# Patient Record
Sex: Female | Born: 1980 | Race: Black or African American | Hispanic: No | Marital: Single | Smoking: Former smoker
Health system: Southern US, Community
[De-identification: ages and names within clinical notes are randomized; demographics above are authoritative.]

## PROBLEM LIST (undated history)

## (undated) DIAGNOSIS — E782 Mixed hyperlipidemia: Secondary | ICD-10-CM

## (undated) DIAGNOSIS — B009 Herpesviral infection, unspecified: Secondary | ICD-10-CM

## (undated) DIAGNOSIS — F1221 Cannabis dependence, in remission: Secondary | ICD-10-CM

## (undated) DIAGNOSIS — N209 Urinary calculus, unspecified: Secondary | ICD-10-CM

## (undated) DIAGNOSIS — G43909 Migraine, unspecified, not intractable, without status migrainosus: Secondary | ICD-10-CM

## (undated) DIAGNOSIS — B999 Unspecified infectious disease: Secondary | ICD-10-CM

## (undated) DIAGNOSIS — O009 Unspecified ectopic pregnancy without intrauterine pregnancy: Secondary | ICD-10-CM

## (undated) DIAGNOSIS — J45909 Unspecified asthma, uncomplicated: Secondary | ICD-10-CM

## (undated) DIAGNOSIS — F17200 Nicotine dependence, unspecified, uncomplicated: Secondary | ICD-10-CM

## (undated) DIAGNOSIS — A599 Trichomoniasis, unspecified: Secondary | ICD-10-CM

## (undated) DIAGNOSIS — R7309 Other abnormal glucose: Secondary | ICD-10-CM

## (undated) HISTORY — PX: SALPINGECTOMY: SHX328

## (undated) HISTORY — DX: Nicotine dependence, unspecified, uncomplicated: F17.200

## (undated) HISTORY — DX: Urinary calculus, unspecified: N20.9

## (undated) HISTORY — DX: Other abnormal glucose: R73.09

## (undated) HISTORY — DX: Migraine, unspecified, not intractable, without status migrainosus: G43.909

## (undated) HISTORY — DX: Mixed hyperlipidemia: E78.2

## (undated) HISTORY — PX: DILATION AND CURETTAGE OF UTERUS: SHX78

## (undated) HISTORY — DX: Unspecified asthma, uncomplicated: J45.909

## (undated) HISTORY — DX: Cannabis dependence, in remission: F12.21

---
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
## 2002-03-21 ENCOUNTER — Inpatient Hospital Stay (HOSPITAL_COMMUNITY): Admission: AD | Admit: 2002-03-21 | Discharge: 2002-03-21 | Payer: Self-pay | Admitting: Obstetrics and Gynecology

## 2002-04-18 ENCOUNTER — Emergency Department (HOSPITAL_COMMUNITY): Admission: EM | Admit: 2002-04-18 | Discharge: 2002-04-18 | Payer: Self-pay

## 2002-05-21 ENCOUNTER — Emergency Department (HOSPITAL_COMMUNITY): Admission: EM | Admit: 2002-05-21 | Discharge: 2002-05-21 | Payer: Self-pay | Admitting: Emergency Medicine

## 2002-06-19 ENCOUNTER — Emergency Department (HOSPITAL_COMMUNITY): Admission: EM | Admit: 2002-06-19 | Discharge: 2002-06-19 | Payer: Self-pay | Admitting: Emergency Medicine

## 2002-10-31 ENCOUNTER — Emergency Department (HOSPITAL_COMMUNITY): Admission: EM | Admit: 2002-10-31 | Discharge: 2002-10-31 | Payer: Self-pay | Admitting: Emergency Medicine

## 2002-11-14 ENCOUNTER — Inpatient Hospital Stay (HOSPITAL_COMMUNITY): Admission: AD | Admit: 2002-11-14 | Discharge: 2002-11-14 | Payer: Self-pay | Admitting: Obstetrics and Gynecology

## 2003-02-21 ENCOUNTER — Emergency Department (HOSPITAL_COMMUNITY): Admission: EM | Admit: 2003-02-21 | Discharge: 2003-02-21 | Payer: Self-pay | Admitting: Emergency Medicine

## 2003-04-05 ENCOUNTER — Other Ambulatory Visit: Admission: RE | Admit: 2003-04-05 | Discharge: 2003-04-05 | Payer: Self-pay | Admitting: Nephrology

## 2003-07-03 ENCOUNTER — Inpatient Hospital Stay (HOSPITAL_COMMUNITY): Admission: AD | Admit: 2003-07-03 | Discharge: 2003-07-04 | Payer: Self-pay | Admitting: Obstetrics and Gynecology

## 2003-12-05 ENCOUNTER — Inpatient Hospital Stay (HOSPITAL_COMMUNITY): Admission: AD | Admit: 2003-12-05 | Discharge: 2003-12-05 | Payer: Self-pay | Admitting: Obstetrics and Gynecology

## 2003-12-05 ENCOUNTER — Emergency Department (HOSPITAL_COMMUNITY): Admission: AD | Admit: 2003-12-05 | Discharge: 2003-12-05 | Payer: Self-pay | Admitting: Emergency Medicine

## 2004-02-20 ENCOUNTER — Emergency Department (HOSPITAL_COMMUNITY): Admission: EM | Admit: 2004-02-20 | Discharge: 2004-02-20 | Payer: Self-pay | Admitting: Emergency Medicine

## 2004-03-28 ENCOUNTER — Emergency Department (HOSPITAL_COMMUNITY): Admission: EM | Admit: 2004-03-28 | Discharge: 2004-03-28 | Payer: Self-pay | Admitting: *Deleted

## 2004-04-24 ENCOUNTER — Emergency Department (HOSPITAL_COMMUNITY): Admission: EM | Admit: 2004-04-24 | Discharge: 2004-04-24 | Payer: Self-pay | Admitting: Family Medicine

## 2004-06-07 ENCOUNTER — Emergency Department (HOSPITAL_COMMUNITY): Admission: EM | Admit: 2004-06-07 | Discharge: 2004-06-07 | Payer: Self-pay | Admitting: Emergency Medicine

## 2005-01-11 ENCOUNTER — Emergency Department (HOSPITAL_COMMUNITY): Admission: EM | Admit: 2005-01-11 | Discharge: 2005-01-11 | Payer: Self-pay | Admitting: Family Medicine

## 2005-05-31 ENCOUNTER — Emergency Department (HOSPITAL_COMMUNITY): Admission: EM | Admit: 2005-05-31 | Discharge: 2005-05-31 | Payer: Self-pay | Admitting: Family Medicine

## 2005-05-31 ENCOUNTER — Emergency Department (HOSPITAL_COMMUNITY): Admission: EM | Admit: 2005-05-31 | Discharge: 2005-05-31 | Payer: Self-pay | Admitting: Emergency Medicine

## 2005-10-18 ENCOUNTER — Emergency Department (HOSPITAL_COMMUNITY): Admission: EM | Admit: 2005-10-18 | Discharge: 2005-10-18 | Payer: Self-pay | Admitting: Family Medicine

## 2005-11-30 ENCOUNTER — Emergency Department (HOSPITAL_COMMUNITY): Admission: EM | Admit: 2005-11-30 | Discharge: 2005-11-30 | Payer: Self-pay | Admitting: Family Medicine

## 2005-12-04 ENCOUNTER — Emergency Department (HOSPITAL_COMMUNITY): Admission: EM | Admit: 2005-12-04 | Discharge: 2005-12-04 | Payer: Self-pay | Admitting: Family Medicine

## 2005-12-08 ENCOUNTER — Emergency Department (HOSPITAL_COMMUNITY): Admission: EM | Admit: 2005-12-08 | Discharge: 2005-12-08 | Payer: Self-pay | Admitting: Family Medicine

## 2006-02-20 ENCOUNTER — Emergency Department (HOSPITAL_COMMUNITY): Admission: EM | Admit: 2006-02-20 | Discharge: 2006-02-20 | Payer: Self-pay | Admitting: Emergency Medicine

## 2006-06-07 ENCOUNTER — Emergency Department (HOSPITAL_COMMUNITY): Admission: EM | Admit: 2006-06-07 | Discharge: 2006-06-07 | Payer: Self-pay | Admitting: Family Medicine

## 2006-06-11 ENCOUNTER — Inpatient Hospital Stay (HOSPITAL_COMMUNITY): Admission: AD | Admit: 2006-06-11 | Discharge: 2006-06-11 | Payer: Self-pay | Admitting: Family Medicine

## 2006-06-20 ENCOUNTER — Inpatient Hospital Stay (HOSPITAL_COMMUNITY): Admission: AD | Admit: 2006-06-20 | Discharge: 2006-06-20 | Payer: Self-pay | Admitting: Gynecology

## 2006-06-28 ENCOUNTER — Inpatient Hospital Stay (HOSPITAL_COMMUNITY): Admission: RE | Admit: 2006-06-28 | Discharge: 2006-06-28 | Payer: Self-pay | Admitting: Gynecology

## 2006-07-02 ENCOUNTER — Emergency Department (HOSPITAL_COMMUNITY): Admission: EM | Admit: 2006-07-02 | Discharge: 2006-07-02 | Payer: Self-pay | Admitting: Family Medicine

## 2006-07-17 ENCOUNTER — Inpatient Hospital Stay (HOSPITAL_COMMUNITY): Admission: AD | Admit: 2006-07-17 | Discharge: 2006-07-18 | Payer: Self-pay | Admitting: Obstetrics & Gynecology

## 2006-08-18 ENCOUNTER — Inpatient Hospital Stay (HOSPITAL_COMMUNITY): Admission: AD | Admit: 2006-08-18 | Discharge: 2006-08-18 | Payer: Self-pay | Admitting: Family Medicine

## 2006-10-28 ENCOUNTER — Inpatient Hospital Stay (HOSPITAL_COMMUNITY): Admission: AD | Admit: 2006-10-28 | Discharge: 2006-10-28 | Payer: Self-pay | Admitting: Obstetrics

## 2006-11-30 ENCOUNTER — Inpatient Hospital Stay (HOSPITAL_COMMUNITY): Admission: AD | Admit: 2006-11-30 | Discharge: 2006-11-30 | Payer: Self-pay | Admitting: Obstetrics

## 2006-12-09 ENCOUNTER — Inpatient Hospital Stay (HOSPITAL_COMMUNITY): Admission: AD | Admit: 2006-12-09 | Discharge: 2006-12-09 | Payer: Self-pay | Admitting: Obstetrics

## 2006-12-27 ENCOUNTER — Inpatient Hospital Stay (HOSPITAL_COMMUNITY): Admission: AD | Admit: 2006-12-27 | Discharge: 2006-12-27 | Payer: Self-pay | Admitting: Obstetrics

## 2007-01-11 ENCOUNTER — Inpatient Hospital Stay (HOSPITAL_COMMUNITY): Admission: AD | Admit: 2007-01-11 | Discharge: 2007-01-11 | Payer: Self-pay | Admitting: Obstetrics

## 2007-01-17 ENCOUNTER — Inpatient Hospital Stay (HOSPITAL_COMMUNITY): Admission: AD | Admit: 2007-01-17 | Discharge: 2007-01-17 | Payer: Self-pay | Admitting: Obstetrics

## 2007-01-19 ENCOUNTER — Inpatient Hospital Stay (HOSPITAL_COMMUNITY): Admission: AD | Admit: 2007-01-19 | Discharge: 2007-01-19 | Payer: Self-pay | Admitting: Obstetrics

## 2007-01-20 ENCOUNTER — Inpatient Hospital Stay (HOSPITAL_COMMUNITY): Admission: AD | Admit: 2007-01-20 | Discharge: 2007-01-20 | Payer: Self-pay | Admitting: Obstetrics

## 2007-01-22 ENCOUNTER — Inpatient Hospital Stay (HOSPITAL_COMMUNITY): Admission: AD | Admit: 2007-01-22 | Discharge: 2007-01-22 | Payer: Self-pay | Admitting: Obstetrics

## 2007-01-23 ENCOUNTER — Inpatient Hospital Stay (HOSPITAL_COMMUNITY): Admission: AD | Admit: 2007-01-23 | Discharge: 2007-01-23 | Payer: Self-pay | Admitting: Obstetrics

## 2007-01-29 ENCOUNTER — Inpatient Hospital Stay (HOSPITAL_COMMUNITY): Admission: AD | Admit: 2007-01-29 | Discharge: 2007-01-29 | Payer: Self-pay | Admitting: Obstetrics

## 2007-02-02 ENCOUNTER — Inpatient Hospital Stay (HOSPITAL_COMMUNITY): Admission: AD | Admit: 2007-02-02 | Discharge: 2007-02-03 | Payer: Self-pay | Admitting: Obstetrics

## 2007-02-04 ENCOUNTER — Inpatient Hospital Stay (HOSPITAL_COMMUNITY): Admission: AD | Admit: 2007-02-04 | Discharge: 2007-02-06 | Payer: Self-pay | Admitting: Obstetrics

## 2008-05-23 ENCOUNTER — Emergency Department (HOSPITAL_COMMUNITY): Admission: EM | Admit: 2008-05-23 | Discharge: 2008-05-23 | Payer: Self-pay | Admitting: Family Medicine

## 2008-05-27 ENCOUNTER — Inpatient Hospital Stay (HOSPITAL_COMMUNITY): Admission: AD | Admit: 2008-05-27 | Discharge: 2008-05-27 | Payer: Self-pay | Admitting: Obstetrics & Gynecology

## 2008-07-13 ENCOUNTER — Inpatient Hospital Stay (HOSPITAL_COMMUNITY): Admission: AD | Admit: 2008-07-13 | Discharge: 2008-07-14 | Payer: Self-pay | Admitting: Obstetrics

## 2008-07-24 ENCOUNTER — Inpatient Hospital Stay (HOSPITAL_COMMUNITY): Admission: AD | Admit: 2008-07-24 | Discharge: 2008-07-24 | Payer: Self-pay | Admitting: Obstetrics

## 2008-07-29 ENCOUNTER — Inpatient Hospital Stay (HOSPITAL_COMMUNITY): Admission: AD | Admit: 2008-07-29 | Discharge: 2008-07-29 | Payer: Self-pay | Admitting: Obstetrics

## 2008-11-25 ENCOUNTER — Emergency Department (HOSPITAL_COMMUNITY): Admission: EM | Admit: 2008-11-25 | Discharge: 2008-11-25 | Payer: Self-pay | Admitting: Emergency Medicine

## 2008-12-13 ENCOUNTER — Inpatient Hospital Stay (HOSPITAL_COMMUNITY): Admission: AD | Admit: 2008-12-13 | Discharge: 2008-12-13 | Payer: Self-pay | Admitting: Obstetrics

## 2008-12-25 ENCOUNTER — Inpatient Hospital Stay (HOSPITAL_COMMUNITY): Admission: AD | Admit: 2008-12-25 | Discharge: 2008-12-25 | Payer: Self-pay | Admitting: Obstetrics

## 2009-01-08 ENCOUNTER — Inpatient Hospital Stay (HOSPITAL_COMMUNITY): Admission: AD | Admit: 2009-01-08 | Discharge: 2009-01-08 | Payer: Self-pay | Admitting: Obstetrics

## 2009-01-13 ENCOUNTER — Inpatient Hospital Stay (HOSPITAL_COMMUNITY): Admission: RE | Admit: 2009-01-13 | Discharge: 2009-01-15 | Payer: Self-pay | Admitting: Obstetrics

## 2009-04-16 ENCOUNTER — Inpatient Hospital Stay (HOSPITAL_COMMUNITY): Admission: AD | Admit: 2009-04-16 | Discharge: 2009-04-17 | Payer: Self-pay | Admitting: Obstetrics

## 2009-06-16 ENCOUNTER — Inpatient Hospital Stay (HOSPITAL_COMMUNITY): Admission: AD | Admit: 2009-06-16 | Discharge: 2009-06-16 | Payer: Self-pay | Admitting: Obstetrics

## 2009-07-14 ENCOUNTER — Ambulatory Visit (HOSPITAL_COMMUNITY): Admission: RE | Admit: 2009-07-14 | Discharge: 2009-07-14 | Payer: Self-pay | Admitting: Obstetrics

## 2009-11-02 ENCOUNTER — Inpatient Hospital Stay (HOSPITAL_COMMUNITY): Admission: AD | Admit: 2009-11-02 | Discharge: 2009-11-02 | Payer: Self-pay | Admitting: Obstetrics

## 2009-11-02 ENCOUNTER — Ambulatory Visit: Payer: Self-pay | Admitting: Obstetrics and Gynecology

## 2009-11-21 ENCOUNTER — Observation Stay (HOSPITAL_COMMUNITY): Admission: AD | Admit: 2009-11-21 | Discharge: 2009-11-22 | Payer: Self-pay | Admitting: Obstetrics

## 2009-11-29 ENCOUNTER — Inpatient Hospital Stay (HOSPITAL_COMMUNITY): Admission: AD | Admit: 2009-11-29 | Discharge: 2009-12-01 | Payer: Self-pay | Admitting: Obstetrics

## 2010-05-01 ENCOUNTER — Inpatient Hospital Stay (HOSPITAL_COMMUNITY): Admission: AD | Admit: 2010-05-01 | Discharge: 2010-05-01 | Payer: Self-pay | Admitting: Obstetrics & Gynecology

## 2010-05-01 ENCOUNTER — Ambulatory Visit: Payer: Self-pay | Admitting: Nurse Practitioner

## 2010-06-22 ENCOUNTER — Inpatient Hospital Stay (HOSPITAL_COMMUNITY): Admission: AD | Admit: 2010-06-22 | Discharge: 2010-06-22 | Payer: Self-pay | Admitting: Family Medicine

## 2010-09-01 ENCOUNTER — Inpatient Hospital Stay (HOSPITAL_COMMUNITY)
Admission: AD | Admit: 2010-09-01 | Discharge: 2010-09-01 | Payer: Self-pay | Source: Home / Self Care | Admitting: Anesthesiology

## 2010-12-04 ENCOUNTER — Inpatient Hospital Stay (HOSPITAL_COMMUNITY)
Admission: AD | Admit: 2010-12-04 | Discharge: 2010-12-07 | DRG: 767 | Disposition: A | Discharge status: Home / Self Care | Payer: Medicaid Other | Source: Ambulatory Visit | Attending: Obstetrics | Admitting: Obstetrics

## 2010-12-04 DIAGNOSIS — Z302 Encounter for sterilization: Secondary | ICD-10-CM

## 2010-12-05 ENCOUNTER — Other Ambulatory Visit: Payer: Self-pay | Admitting: Obstetrics

## 2010-12-05 HISTORY — PX: TUBAL LIGATION: SHX77

## 2010-12-05 LAB — CBC
Hemoglobin: 9.6 g/dL — ABNORMAL LOW (ref 12.0–15.0)
MCH: 24.4 pg — ABNORMAL LOW (ref 26.0–34.0)
MCHC: 31.2 g/dL (ref 30.0–36.0)
RDW: 17 % — ABNORMAL HIGH (ref 11.5–15.5)

## 2010-12-05 LAB — RPR: RPR Ser Ql: NONREACTIVE

## 2010-12-06 LAB — CBC
HCT: 27 % — ABNORMAL LOW (ref 36.0–46.0)
MCHC: 30 g/dL (ref 30.0–36.0)
MCV: 79.2 fL (ref 78.0–100.0)
Platelets: 148 10*3/uL — ABNORMAL LOW (ref 150–400)
RDW: 17.1 % — ABNORMAL HIGH (ref 11.5–15.5)

## 2010-12-07 LAB — URINALYSIS, ROUTINE W REFLEX MICROSCOPIC
Bilirubin Urine: NEGATIVE
Glucose, UA: NEGATIVE mg/dL
Ketones, ur: NEGATIVE mg/dL
Nitrite: NEGATIVE
Protein, ur: NEGATIVE mg/dL
Specific Gravity, Urine: >1.03 — ABNORMAL HIGH (ref 1.005–1.030)
Urobilinogen, UA: >8 mg/dL — ABNORMAL HIGH (ref 0.0–1.0)
pH: 6 (ref 5.0–8.0)

## 2010-12-07 LAB — WET PREP, GENITAL
Trich, Wet Prep: NONE SEEN
Yeast Wet Prep HPF POC: NONE SEEN

## 2010-12-07 LAB — URINE CULTURE
Colony Count: 35000
Culture  Setup Time: 201112070145

## 2010-12-07 LAB — URINE MICROSCOPIC-ADD ON

## 2010-12-07 LAB — GC/CHLAMYDIA PROBE AMP, GENITAL
Chlamydia, DNA Probe: NEGATIVE
GC Probe Amp, Genital: NEGATIVE

## 2010-12-09 NOTE — Op Note (Signed)
  Theresa Mooney, Theresa Mooney NO.:  [id number]  MEDICAL RECORD NO.:  [id number]           PATIENT TYPE:  I  LOCATION:  9110                          FACILITY:  [facility]  PHYSICIAN:  Kathreen Cosier, M.D.DATE OF BIRTH:  09-23-1981  DATE OF PROCEDURE:  12/05/2010 DATE OF DISCHARGE:                              OPERATIVE REPORT   PREOPERATIVE DIAGNOSES:  Multiparity, desires postpartum tubal ligation.  ANESTHESIA:  Epidural.  PROCEDURE:  The patient placed on the operating table in supine position.  Abdomen prepped and draped.  Bladder emptied with a Foley catheter.  Midline subumbilical incision 1-inch long was made, carried down to the fascia.  Fascia cleaned, fascia grasped with 2 Kochers, and the fascia and the peritoneum opened with Mayo scissors.  Left tube grasped in midportion with Babcock clamp.  A 0 plain suture placed in the mesosalpinx below the portion of tube within the clamp.  This was tied and approximately 1 inch of tube removed.  Procedure done in a similar fashion the other side.  Lap and sponge counts correct.  Abdomen closed in layers, peritoneum and fascia continuous suture with 0 Dexon. Skin closed with subcuticular stitch of 4-0 Monocryl.  Blood loss minimal.  The patient tolerated the procedure well, and taken to recovery room in good condition.          ______________________________ Kathreen Cosier, M.D.     BAM/MEDQ  D:  12/05/2010  T:  12/05/2010  Job:  [id number]  Electronically Signed by Francoise Ceo M.D. on 12/09/2010 07:14:09 AM

## 2010-12-10 LAB — URINALYSIS, ROUTINE W REFLEX MICROSCOPIC
Bilirubin Urine: NEGATIVE
Glucose, UA: NEGATIVE mg/dL
Ketones, ur: NEGATIVE mg/dL
pH: 6 (ref 5.0–8.0)

## 2010-12-10 LAB — URINE MICROSCOPIC-ADD ON

## 2010-12-11 LAB — URINALYSIS, ROUTINE W REFLEX MICROSCOPIC
Nitrite: NEGATIVE
Specific Gravity, Urine: 1.025 (ref 1.005–1.030)
Urobilinogen, UA: 1 mg/dL (ref 0.0–1.0)

## 2010-12-11 LAB — URINE CULTURE
Colony Count: >=100000
Culture  Setup Time: 201108060248

## 2010-12-11 LAB — CBC
HCT: 31.3 % — ABNORMAL LOW (ref 36.0–46.0)
Hemoglobin: 10.4 g/dL — ABNORMAL LOW (ref 12.0–15.0)
MCH: 28.9 pg (ref 26.0–34.0)
MCHC: 33.3 g/dL (ref 30.0–36.0)
RBC: 3.61 MIL/uL — ABNORMAL LOW (ref 3.87–5.11)

## 2010-12-11 LAB — GC/CHLAMYDIA PROBE AMP, GENITAL
Chlamydia, DNA Probe: NEGATIVE
GC Probe Amp, Genital: NEGATIVE

## 2010-12-11 LAB — URINE MICROSCOPIC-ADD ON

## 2010-12-11 LAB — WET PREP, GENITAL

## 2010-12-16 LAB — URINE MICROSCOPIC-ADD ON

## 2010-12-16 LAB — DIFFERENTIAL
Eosinophils Absolute: 0 10*3/uL (ref 0.0–0.7)
Lymphocytes Relative: 17 % (ref 12–46)
Lymphs Abs: 0.8 10*3/uL (ref 0.7–4.0)
Monocytes Relative: 12 % (ref 3–12)
Neutro Abs: 3.2 10*3/uL (ref 1.7–7.7)
Neutrophils Relative %: 71 % (ref 43–77)

## 2010-12-16 LAB — CBC
Hemoglobin: 10.1 g/dL — ABNORMAL LOW (ref 12.0–15.0)
MCHC: 31.8 g/dL (ref 30.0–36.0)
MCV: 82 fL (ref 78.0–100.0)
Platelets: 144 10*3/uL — ABNORMAL LOW (ref 150–400)
RBC: 3.37 MIL/uL — ABNORMAL LOW (ref 3.87–5.11)
RBC: 3.89 MIL/uL (ref 3.87–5.11)
WBC: 4.5 10*3/uL (ref 4.0–10.5)
WBC: 6.7 10*3/uL (ref 4.0–10.5)

## 2010-12-16 LAB — RPR: RPR Ser Ql: NONREACTIVE

## 2010-12-16 LAB — BASIC METABOLIC PANEL
BUN: 6 mg/dL (ref 6–23)
Calcium: 8.4 mg/dL (ref 8.4–10.5)
Chloride: 103 mEq/L (ref 96–112)
Creatinine, Ser: 0.62 mg/dL (ref 0.4–1.2)
GFR calc Af Amer: >60 mL/min (ref >60)
GFR calc non Af Amer: >60 mL/min (ref >60)

## 2010-12-16 LAB — URINALYSIS, ROUTINE W REFLEX MICROSCOPIC
Glucose, UA: NEGATIVE mg/dL
Ketones, ur: 15 mg/dL — AB
Leukocytes, UA: NEGATIVE
Nitrite: NEGATIVE
Specific Gravity, Urine: >1.03 — ABNORMAL HIGH (ref 1.005–1.030)
pH: 6 (ref 5.0–8.0)

## 2010-12-20 LAB — CBC
HCT: 29 % — ABNORMAL LOW (ref 36.0–46.0)
Hemoglobin: 8.7 g/dL — ABNORMAL LOW (ref 12.0–15.0)
Hemoglobin: 9.1 g/dL — ABNORMAL LOW (ref 12.0–15.0)
MCHC: 31.6 g/dL (ref 30.0–36.0)
MCV: 81.9 fL (ref 78.0–100.0)
MCV: 82.6 fL (ref 78.0–100.0)
Platelets: 178 10*3/uL (ref 150–400)
RBC: 3.33 MIL/uL — ABNORMAL LOW (ref 3.87–5.11)
RDW: 16.1 % — ABNORMAL HIGH (ref 11.5–15.5)
RDW: 16.6 % — ABNORMAL HIGH (ref 11.5–15.5)

## 2010-12-27 DIAGNOSIS — A599 Trichomoniasis, unspecified: Secondary | ICD-10-CM

## 2010-12-27 HISTORY — DX: Trichomoniasis, unspecified: A59.9

## 2011-01-01 LAB — URINALYSIS, ROUTINE W REFLEX MICROSCOPIC
Bilirubin Urine: NEGATIVE
Nitrite: NEGATIVE
Protein, ur: NEGATIVE mg/dL
Urobilinogen, UA: 1 mg/dL (ref 0.0–1.0)

## 2011-01-01 LAB — CBC
HCT: 31 % — ABNORMAL LOW (ref 36.0–46.0)
Hemoglobin: 10.1 g/dL — ABNORMAL LOW (ref 12.0–15.0)
MCHC: 32.7 g/dL (ref 30.0–36.0)
Platelets: 220 10*3/uL (ref 150–400)
RDW: 13.2 % (ref 11.5–15.5)

## 2011-01-01 LAB — URINE MICROSCOPIC-ADD ON

## 2011-01-01 LAB — GC/CHLAMYDIA PROBE AMP, GENITAL: GC Probe Amp, Genital: NEGATIVE

## 2011-01-03 LAB — COMPREHENSIVE METABOLIC PANEL
Albumin: 3.6 g/dL (ref 3.5–5.2)
Alkaline Phosphatase: 79 U/L (ref 39–117)
BUN: 11 mg/dL (ref 6–23)
Creatinine, Ser: 0.77 mg/dL (ref 0.4–1.2)
Potassium: 3.8 mEq/L (ref 3.5–5.1)
Total Protein: 7.1 g/dL (ref 6.0–8.3)

## 2011-01-03 LAB — WET PREP, GENITAL
Trich, Wet Prep: NONE SEEN
Yeast Wet Prep HPF POC: NONE SEEN

## 2011-01-03 LAB — URINALYSIS, ROUTINE W REFLEX MICROSCOPIC
Bilirubin Urine: NEGATIVE
Specific Gravity, Urine: 1.02 (ref 1.005–1.030)
Urobilinogen, UA: 1 mg/dL (ref 0.0–1.0)

## 2011-01-03 LAB — GC/CHLAMYDIA PROBE AMP, GENITAL
Chlamydia, DNA Probe: NEGATIVE
GC Probe Amp, Genital: NEGATIVE

## 2011-01-03 LAB — URINE MICROSCOPIC-ADD ON

## 2011-01-03 LAB — POCT PREGNANCY, URINE: Preg Test, Ur: POSITIVE

## 2011-01-06 LAB — CBC
HCT: 28.3 % — ABNORMAL LOW (ref 36.0–46.0)
HCT: 31.2 % — ABNORMAL LOW (ref 36.0–46.0)
Hemoglobin: 10.4 g/dL — ABNORMAL LOW (ref 12.0–15.0)
MCHC: 32.8 g/dL (ref 30.0–36.0)
MCHC: 33.3 g/dL (ref 30.0–36.0)
MCV: 84.4 fL (ref 78.0–100.0)
MCV: 84.7 fL (ref 78.0–100.0)
Platelets: 166 10*3/uL (ref 150–400)
RBC: 3.69 MIL/uL — ABNORMAL LOW (ref 3.87–5.11)
RDW: 15.6 % — ABNORMAL HIGH (ref 11.5–15.5)

## 2011-06-28 LAB — URINALYSIS, ROUTINE W REFLEX MICROSCOPIC
Bilirubin Urine: NEGATIVE
Glucose, UA: NEGATIVE
Ketones, ur: 40 — AB
Leukocytes, UA: NEGATIVE
Nitrite: NEGATIVE
Nitrite: NEGATIVE
Protein, ur: NEGATIVE
Urobilinogen, UA: 0.2
pH: 6

## 2011-06-28 LAB — URINE MICROSCOPIC-ADD ON

## 2011-06-28 LAB — WET PREP, GENITAL

## 2011-06-29 LAB — URINE MICROSCOPIC-ADD ON

## 2011-06-29 LAB — URINALYSIS, ROUTINE W REFLEX MICROSCOPIC
Leukocytes, UA: NEGATIVE
Nitrite: NEGATIVE
Specific Gravity, Urine: >1.03 — ABNORMAL HIGH
Urobilinogen, UA: 0.2
pH: 5.5

## 2011-11-26 ENCOUNTER — Inpatient Hospital Stay (HOSPITAL_COMMUNITY): Payer: Self-pay

## 2011-11-26 ENCOUNTER — Inpatient Hospital Stay (HOSPITAL_COMMUNITY)
Admission: AD | Admit: 2011-11-26 | Discharge: 2011-11-26 | Disposition: A | Discharge status: Home / Self Care | Payer: Self-pay | Source: Ambulatory Visit | Attending: Family Medicine | Admitting: Family Medicine

## 2011-11-26 ENCOUNTER — Encounter (HOSPITAL_COMMUNITY): Payer: Self-pay | Admitting: *Deleted

## 2011-11-26 DIAGNOSIS — R109 Unspecified abdominal pain: Secondary | ICD-10-CM | POA: Insufficient documentation

## 2011-11-26 DIAGNOSIS — O00109 Unspecified tubal pregnancy without intrauterine pregnancy: Secondary | ICD-10-CM | POA: Insufficient documentation

## 2011-11-26 DIAGNOSIS — R300 Dysuria: Secondary | ICD-10-CM | POA: Insufficient documentation

## 2011-11-26 DIAGNOSIS — O009 Unspecified ectopic pregnancy without intrauterine pregnancy: Secondary | ICD-10-CM

## 2011-11-26 HISTORY — DX: Trichomoniasis, unspecified: A59.9

## 2011-11-26 HISTORY — DX: Unspecified infectious disease: B99.9

## 2011-11-26 HISTORY — DX: Herpesviral infection, unspecified: B00.9

## 2011-11-26 LAB — URINALYSIS, ROUTINE W REFLEX MICROSCOPIC
Bilirubin Urine: NEGATIVE
Glucose, UA: NEGATIVE mg/dL
Ketones, ur: NEGATIVE mg/dL
Protein, ur: NEGATIVE mg/dL
pH: 6 (ref 5.0–8.0)

## 2011-11-26 LAB — CBC
HCT: 33.7 % — ABNORMAL LOW (ref 36.0–46.0)
Hemoglobin: 10.9 g/dL — ABNORMAL LOW (ref 12.0–15.0)
MCH: 28.2 pg (ref 26.0–34.0)
MCHC: 32.3 g/dL (ref 30.0–36.0)
MCV: 87.1 fL (ref 78.0–100.0)
RDW: 13.5 % (ref 11.5–15.5)

## 2011-11-26 LAB — URINE MICROSCOPIC-ADD ON

## 2011-11-26 LAB — WET PREP, GENITAL

## 2011-11-26 MED ORDER — METHOTREXATE INJECTION FOR WOMEN'S HOSPITAL
50.0000 mg/m2 | Freq: Once | INTRAMUSCULAR | Status: AC
  Administered 2011-11-26: 95 mg via INTRAMUSCULAR
  Filled 2011-11-26: qty 1.9

## 2011-11-26 NOTE — Progress Notes (Signed)
Pt states, " I had my tubes tied by Dr Gaynell Face in March 2012, and I have missed my period and I had a positive HPT.  I 've had pressure when I pee for three days, and abdominal pain since last week."

## 2011-11-26 NOTE — Discharge Instructions (Signed)
Ectopic Pregnancy, Treatment with Methotrexate Ectopic pregnancy is a pregnancy located outside of the uterus (womb). An ectopic pregnancy is usually located in the fallopian tube, but rarely it can be located on the ovary in the pelvis, or on the intestine in the abdomen. Immediate treatment is necessary, because if it tears (ruptures), it can cause severe bleeding (hemorrhage). Ectopic pregnancy occurs in 1 out of 50 pregnancies. It is the leading cause of death in the first 3 months of pregnancy.  If the ectopic pregnancy is found early enough, it can be treated with medicine. This medicine is called methotrexate. If the pregnancy is 1  inches (3.5 cm) or larger, or it ruptures, surgery to remove the pregnancy becomes necessary, and methotrexate should not be used. CAUSES   Blocked tubes from scarring, caused by a pelvic infection.   Other risk factors include:   Previous ectopic pregnancy.   Surgery on the fallopian tube.   DES (estrogen hormone) given to the mother during her pregnancy.   In vitro fertilization, with stimulating the ovaries to produce eggs.   Older women (over age 35) getting pregnant.   Women who smoke.   Previous pelvic or abdominal surgery.   Blocked fallopian tubes.   Reversing a previous tubal ligation (female sterilization, "tied tubes").   Endometriosis (uterus tissue growing outside the uterus) causing scar tissue in and around the tubes.   Intrauterine device (IUD) contraception (rare).   Sexually transmitted disease (STD).   Often, there is no apparent cause.  SYMPTOMS   Pregnancy symptoms:   Nausea.   Vomiting.   Tiredness.   Feeling of fullness in the abdomen.   Missed menstrual period.   Breast tenderness.   Frequent urination.   Abdominal discomfort or pain.   Vaginal spotting or bleeding.   Pain with intercourse.  Symptoms of a bleeding or ruptured ectopic pregnancy:  All of the above symptoms.   Shoulder pain, from  blood between the liver and diaphragm (muscle below the lungs).   Fast heartbeat.   Low blood pressure.   Dizziness or lightheadedness.   Passing out.   Severe or sudden abdominal pain, while having a bowel movement.  DIAGNOSIS   Pregnancy test, to confirm the diagnosis.   Pelvic examination.   Ultrasound of the pelvis.   CT scan, may be necessary.   Blood tests to check the pregnancy hormone level changes over several days.   Progesterone blood level changes, taken over several days.   Laparoscopic surgery (examining inside the abdomen, with a lighted tube), if the above tests cannot confirm the diagnosis.   Abdominal exploratory surgery, may be necessary.  TREATMENT  Methotrexate works by stopping the pregnancy from growing. It helps the body absorb the pregnancy tissue over a 4 to 6 week period. This preserves the fallopian tube. Blood tests will be taken for several weeks, to check the pregnancy hormone levels, until there is no more pregnancy hormone detected in the blood. The success rate is between 84% and 90%. There is still a risk of the ectopic pregnancy rupturing while using the methotrexate.  Methotrexate 75 mg, given in 1 dose in the muscle. Sometimes, it may take more than 1 dose.  Methotrexate should not be given in women who:   Are breast-feeding.   Have liver disease.   Have blood problems.   Are allergic to methotrexate.   Have lung disease.   Have kidney disease.   Have peptic ulcer.   Have an ectopic pregnancy larger   than 1  inches (3.5 cm).   Have cardiac motion (fetal heartbeat) seen in the pregnancy.  Before giving the medications:  Liver, kidney, and bone marrow blood tests are done.   Blood tests to measure the pregnancy hormone levels and blood type are done.   If the woman is Rh negative, and the father is Rh positive or his Rh type is not known, Rh immune globulin (RhoGAM) is given.  SIDE EFFECTS OF METHOTREXATE  Nausea and  vomiting.   Mouth sores.   Diarrhea.   Rash.   Dizziness.   Increased abdominal pain.   Low white blood cell count (rare).   Increased vaginal bleeding or spotting.   Pneumonia.   Hair loss (rare, and reversible).   Failed treatment.  On very rare occasions, the medication may affect your blood counts, liver, kidney, bone marrow, or hormone levels. If this happens, your caregiver will want to perform more blood tests. HOME CARE INSTRUCTIONS   Do not have sexual intercourse until your caregiver says it is safe to do so.   You may resume your usual diet and regular activities.   Do not take folic acid or vitamins that contain folic acid.   Do not take aspirin, ibuprofen, or naproxen (NSAIDs).   Do not drink alcoholic beverages.   Return to the emergency room or your caregiver's office for blood work as directed, to be sure the pregnancy hormone is no longer present. This is usually done in 4 to 7 days, and then at weekly visits until there is no pregnancy hormone detected.   It is very important to return for your blood work as instructed, to make sure the pregnancy is not progressing.  AN ECTOPIC PREGNANCY IS A SERIOUS THREAT TO YOUR LIFE, SO INSTRUCTIONS MUST BE FOLLOWED. You and your spouse or partner may develop emotional problems over the loss of your pregnancy. If so:  Grieving helps.   Counseling often helps.   Let your body and mind heal before trying to get pregnant again.   Talk to your caregiver for advice about when to try to get pregnant again.  SEEK MEDICAL CARE IF:   You cannot control your nausea and vomiting.   You cannot control your diarrhea.   You need treatment for the sores in your mouth.   You need pain medicine for your abdominal pain.   You develop a rash.   You are having a reaction to the medicine.  SEEK IMMEDIATE MEDICAL CARE IF:   You experience increasing abdominal or pelvic pain.   You notice increased bleeding.   You feel  lightheaded or you faint (pass out).   You develop shortness of breath.   You develop an increase in your heart rate.   You develop a cough.   You develop chills.   You have an oral temperature above 102 F (38.9 C), not controlled by medicine.  Document Released: 09/07/2001 Document Revised: 03/29/2011 Document Reviewed: 09/16/2009 [employer] Patient Information 2012 [employer]. 

## 2011-11-26 NOTE — ED Provider Notes (Signed)
History     Chief Complaint  Patient presents with  . Abdominal Pain  . Dysuria   HPI  pt is pregnant with history of PP tubal ligation March 2012 by Dr. Gaynell Face.  (Pt is no longer a patient in their practice) Her LMP was ? 1/22.  She complains of diffuse lower abdominal pain for about 1 week. That radiates to her back.  The pain is worse when she laughs.  She has been sleepy and craving shrimp and Kris[employer]reme doughnuts.  Her breasts are sore and tender.  She has not been nauseated or vomiting.  She has had some pressure with urinating.  She has a history of trichomonas and thinks it may have returned.  She complains of thin clear vaginal discharge with odor.  She denies constipation or diarrhea.  She has not taken any medication for her pain.    Past Medical History  Diagnosis Date  . Herpes 2002  . Trichimoniasis 12-2010  . Infection     Past Surgical History  Procedure Date  . Tubal ligation 12-05-2010    Family History  Problem Relation Age of Onset  . Anesthesia problems Neg Hx     History  Substance Use Topics  . Smoking status: Never Smoker   . Smokeless tobacco: Never Used  . Alcohol Use: No    Allergies: No Known Allergies  No prescriptions prior to admission    Review of Systems  Constitutional: Negative for fever and chills.  Gastrointestinal: Positive for nausea and abdominal pain. Negative for vomiting, diarrhea and constipation.  Genitourinary: Negative for dysuria and urgency.  Neurological: Negative for headaches.   Physical Exam   Blood pressure 110/62, pulse 83, temperature 98.2 F (36.8 C), temperature source Oral, resp. rate 20, height 5' 9.5" (1.765 m), weight 168 lb 2 oz (76.261 kg), last menstrual period 10/19/2011.  Physical Exam  Nursing note and vitals reviewed. Constitutional: She is oriented to person, place, and time. She appears well-developed and well-nourished.  HENT:  Head: Normocephalic.  Eyes: Pupils are equal, round, and  reactive to light.  Neck: Normal range of motion. Neck supple.  Cardiovascular: Normal rate.   Respiratory: Effort normal.  GI: Soft. She exhibits no distension. There is no tenderness. There is no rebound and no guarding.  Genitourinary: Vagina normal and uterus normal. No vaginal discharge found.       nontender bimanual; no rebound tenderness  Musculoskeletal: Normal range of motion.  Neurological: She is alert and oriented to person, place, and time.  Skin: Skin is warm and dry.    MAU Course  Procedures  Results for orders placed during the hospital encounter of 11/26/11 (from the past 24 hour(s))  URINALYSIS, ROUTINE W REFLEX MICROSCOPIC     Status: Abnormal   Collection Time   11/26/11  4:48 PM      Component Value Range   Color, Urine YELLOW  YELLOW    APPearance CLEAR  CLEAR    Specific Gravity, Urine >1.030 (*) 1.005 - 1.030    pH 6.0  5.0 - 8.0    Glucose, UA NEGATIVE  NEGATIVE (mg/dL)   Hgb urine dipstick MODERATE (*) NEGATIVE    Bilirubin Urine NEGATIVE  NEGATIVE    Ketones, ur NEGATIVE  NEGATIVE (mg/dL)   Protein, ur NEGATIVE  NEGATIVE (mg/dL)   Urobilinogen, UA 0.2  0.0 - 1.0 (mg/dL)   Nitrite NEGATIVE  NEGATIVE    Leukocytes, UA NEGATIVE  NEGATIVE   URINE MICROSCOPIC-ADD ON  Status: Abnormal   Collection Time   11/26/11  4:48 PM      Component Value Range   Squamous Epithelial / LPF MANY (*) RARE    WBC, UA 0-2  <3 (WBC/hpf)   RBC / HPF 0-2  <3 (RBC/hpf)   Bacteria, UA FEW (*) RARE    Urine-Other MUCOUS PRESENT    CBC     Status: Abnormal   Collection Time   11/26/11  5:39 PM      Component Value Range   WBC 6.2  4.0 - 10.5 (K/uL)   RBC 3.87  3.87 - 5.11 (MIL/uL)   Hemoglobin 10.9 (*) 12.0 - 15.0 (g/dL)   HCT 16.1 (*) 09.6 - 46.0 (%)   MCV 87.1  78.0 - 100.0 (fL)   MCH 28.2  26.0 - 34.0 (pg)   MCHC 32.3  30.0 - 36.0 (g/dL)   RDW 04.5  40.9 - 81.1 (%)   Platelets 305  150 - 400 (K/uL)  HCG, QUANTITATIVE, PREGNANCY     Status: Abnormal   Collection  Time   11/26/11  5:41 PM      Component Value Range   hCG, Beta Chain, Quant, S 5742 (*) <5 (mIU/mL)  BUN     Status: Normal   Collection Time   11/26/11  5:41 PM      Component Value Range   BUN 11  6 - 23 (mg/dL)  CREATININE, SERUM     Status: Normal   Collection Time   11/26/11  5:41 PM      Component Value Range   Creatinine, Ser 0.71  0.50 - 1.10 (mg/dL)   GFR calc non Af Amer >90  >90 (mL/min)   GFR calc Af Amer >90  >90 (mL/min)  WET PREP, GENITAL     Status: Abnormal   Collection Time   11/26/11  5:55 PM      Component Value Range   Yeast Wet Prep HPF POC NONE SEEN  NONE SEEN    Trich, Wet Prep NONE SEEN  NONE SEEN    Clue Cells Wet Prep HPF POC MODERATE (*) NONE SEEN    WBC, Wet Prep HPF POC MODERATE (*) NONE SEEN    Care turned over to Georges Mouse, CNM    US Ob Comp Less 14 Wks - Bilateral ring like cystic structures highly suspicious for ectopic pregnancy adjacent to and separate from each ovary, bilateral corpus luteum cysts, no free fluid, no evidence of IUP  11/26/2011  OBSTETRICAL ULTRASOUND: This exam was performed within a [facility]. The OB US report was generated in the [facility] system, and faxed to the ordering physician.   This report is also available in [employer] and in the [employer]. See [facility] Obstetric US report.  Assessment and Plan  31 y.o. [id number] at [redacted]w[redacted]d with bilateral ectopic pregnancy s/p BTL Methotrexate administered in MAU F/U per protocol, precautions rev'd  Lateia Fraser 11/27/2011, 12:51 AM

## 2011-11-27 ENCOUNTER — Encounter (HOSPITAL_COMMUNITY): Payer: Self-pay | Admitting: Obstetrics and Gynecology

## 2011-11-27 ENCOUNTER — Encounter (HOSPITAL_COMMUNITY)
Admission: AD | Disposition: A | Discharge status: Home / Self Care | Payer: Self-pay | Source: Ambulatory Visit | Attending: Obstetrics and Gynecology

## 2011-11-27 ENCOUNTER — Encounter (HOSPITAL_COMMUNITY): Payer: Self-pay | Admitting: Anesthesiology

## 2011-11-27 ENCOUNTER — Inpatient Hospital Stay (HOSPITAL_COMMUNITY): Payer: Self-pay | Admitting: Anesthesiology

## 2011-11-27 ENCOUNTER — Inpatient Hospital Stay (HOSPITAL_COMMUNITY): Payer: Self-pay

## 2011-11-27 ENCOUNTER — Inpatient Hospital Stay (HOSPITAL_COMMUNITY)
Admission: AD | Admit: 2011-11-27 | Discharge: 2011-11-28 | DRG: 777 | Disposition: A | Discharge status: Home / Self Care | Payer: Self-pay | Source: Ambulatory Visit | Attending: Obstetrics and Gynecology | Admitting: Obstetrics and Gynecology

## 2011-11-27 DIAGNOSIS — G8918 Other acute postprocedural pain: Secondary | ICD-10-CM | POA: Diagnosis present

## 2011-11-27 DIAGNOSIS — O00109 Unspecified tubal pregnancy without intrauterine pregnancy: Principal | ICD-10-CM

## 2011-11-27 DIAGNOSIS — K661 Hemoperitoneum: Secondary | ICD-10-CM | POA: Diagnosis present

## 2011-11-27 HISTORY — PX: LAPAROSCOPY: SHX197

## 2011-11-27 LAB — GC/CHLAMYDIA PROBE AMP, GENITAL
Chlamydia, DNA Probe: NEGATIVE
GC Probe Amp, Genital: NEGATIVE

## 2011-11-27 LAB — CBC
HCT: 33.6 % — ABNORMAL LOW (ref 36.0–46.0)
Hemoglobin: 10.9 g/dL — ABNORMAL LOW (ref 12.0–15.0)
MCH: 28.2 pg (ref 26.0–34.0)
MCV: 87 fL (ref 78.0–100.0)
RBC: 3.86 MIL/uL — ABNORMAL LOW (ref 3.87–5.11)
WBC: 6.6 10*3/uL (ref 4.0–10.5)

## 2011-11-27 SURGERY — LAPAROSCOPY OPERATIVE
Anesthesia: General | Laterality: Bilateral

## 2011-11-27 MED ORDER — LIDOCAINE HCL (CARDIAC) 20 MG/ML IV SOLN
INTRAVENOUS | Status: DC | PRN
  Administered 2011-11-27: 20 mg via INTRAVENOUS

## 2011-11-27 MED ORDER — HYDROMORPHONE HCL PF 1 MG/ML IJ SOLN
INTRAMUSCULAR | Status: AC
  Administered 2011-11-27: 0.25 mg via INTRAVENOUS
  Filled 2011-11-27: qty 1

## 2011-11-27 MED ORDER — FENTANYL CITRATE 0.05 MG/ML IJ SOLN
INTRAMUSCULAR | Status: AC
  Filled 2011-11-27: qty 5

## 2011-11-27 MED ORDER — OXYCODONE-ACETAMINOPHEN 5-325 MG PO TABS
1.0000 | ORAL_TABLET | ORAL | Status: DC | PRN
  Administered 2011-11-27 – 2011-11-28 (×2): 2 via ORAL
  Filled 2011-11-27 (×2): qty 2

## 2011-11-27 MED ORDER — GLYCOPYRROLATE 0.2 MG/ML IJ SOLN
INTRAMUSCULAR | Status: AC
  Filled 2011-11-27: qty 3

## 2011-11-27 MED ORDER — NEOSTIGMINE METHYLSULFATE 1 MG/ML IJ SOLN
INTRAMUSCULAR | Status: DC | PRN
  Administered 2011-11-27: 2.5 mg via INTRAVENOUS

## 2011-11-27 MED ORDER — ROCURONIUM BROMIDE 50 MG/5ML IV SOLN
INTRAVENOUS | Status: AC
  Filled 2011-11-27: qty 1

## 2011-11-27 MED ORDER — IBUPROFEN 600 MG PO TABS: 600.0000 mg | ORAL_TABLET | Freq: Four times a day (QID) | ORAL | Status: DC | PRN

## 2011-11-27 MED ORDER — DOCUSATE SODIUM 100 MG PO CAPS: 100.0000 mg | ORAL_CAPSULE | Freq: Two times a day (BID) | ORAL | Status: DC | PRN

## 2011-11-27 MED ORDER — MIDAZOLAM HCL 5 MG/5ML IJ SOLN
INTRAMUSCULAR | Status: DC | PRN
  Administered 2011-11-27: 2 mg via INTRAVENOUS

## 2011-11-27 MED ORDER — HYDROMORPHONE HCL PF 1 MG/ML IJ SOLN
INTRAMUSCULAR | Status: AC
  Administered 2011-11-27: 0.5 mg via INTRAVENOUS
  Filled 2011-11-27: qty 1

## 2011-11-27 MED ORDER — CITRIC ACID-SODIUM CITRATE 334-500 MG/5ML PO SOLN
30.0000 mL | Freq: Once | ORAL | Status: AC
  Administered 2011-11-27: 30 mL via ORAL
  Filled 2011-11-27: qty 15

## 2011-11-27 MED ORDER — DEXAMETHASONE SODIUM PHOSPHATE 4 MG/ML IJ SOLN
INTRAMUSCULAR | Status: DC | PRN
  Administered 2011-11-27: 10 mg via INTRAVENOUS

## 2011-11-27 MED ORDER — BUPIVACAINE HCL (PF) 0.25 % IJ SOLN
INTRAMUSCULAR | Status: DC | PRN
  Administered 2011-11-27: 3 mL

## 2011-11-27 MED ORDER — GLYCOPYRROLATE 0.2 MG/ML IJ SOLN
INTRAMUSCULAR | Status: AC
  Filled 2011-11-27: qty 1

## 2011-11-27 MED ORDER — IBUPROFEN 600 MG PO TABS: 600.0000 mg | ORAL_TABLET | Freq: Four times a day (QID) | ORAL | Status: AC | PRN

## 2011-11-27 MED ORDER — ROCURONIUM BROMIDE 100 MG/10ML IV SOLN
INTRAVENOUS | Status: DC | PRN
  Administered 2011-11-27: 30 mg via INTRAVENOUS

## 2011-11-27 MED ORDER — ONDANSETRON HCL 4 MG/2ML IJ SOLN
INTRAMUSCULAR | Status: AC
  Filled 2011-11-27: qty 2

## 2011-11-27 MED ORDER — LACTATED RINGERS IV SOLN
INTRAVENOUS | Status: DC
  Administered 2011-11-27: 14:00:00 via INTRAVENOUS
  Administered 2011-11-27: 125 mL/h via INTRAVENOUS
  Administered 2011-11-27 – 2011-11-28 (×4): via INTRAVENOUS

## 2011-11-27 MED ORDER — PROPOFOL 10 MG/ML IV EMUL
INTRAVENOUS | Status: AC
  Filled 2011-11-27: qty 20

## 2011-11-27 MED ORDER — NEOSTIGMINE METHYLSULFATE 1 MG/ML IJ SOLN
INTRAMUSCULAR | Status: AC
  Filled 2011-11-27: qty 10

## 2011-11-27 MED ORDER — FENTANYL CITRATE 0.05 MG/ML IJ SOLN
INTRAMUSCULAR | Status: DC | PRN
  Administered 2011-11-27: 100 ug via INTRAVENOUS
  Administered 2011-11-27 (×4): 50 ug via INTRAVENOUS
  Administered 2011-11-27: 100 ug via INTRAVENOUS

## 2011-11-27 MED ORDER — MIDAZOLAM HCL 2 MG/2ML IJ SOLN
INTRAMUSCULAR | Status: AC
  Filled 2011-11-27: qty 2

## 2011-11-27 MED ORDER — GLYCOPYRROLATE 0.2 MG/ML IJ SOLN
INTRAMUSCULAR | Status: DC | PRN
  Administered 2011-11-27: .5 mg via INTRAVENOUS

## 2011-11-27 MED ORDER — BUPIVACAINE HCL (PF) 0.25 % IJ SOLN
INTRAMUSCULAR | Status: AC
  Filled 2011-11-27: qty 30

## 2011-11-27 MED ORDER — ONDANSETRON HCL 4 MG/2ML IJ SOLN
INTRAMUSCULAR | Status: DC | PRN
  Administered 2011-11-27: 4 mg via INTRAVENOUS

## 2011-11-27 MED ORDER — LACTATED RINGERS IV SOLN
INTRAVENOUS | Status: DC
  Administered 2011-11-28: 01:00:00 via INTRAVENOUS

## 2011-11-27 MED ORDER — OXYCODONE-ACETAMINOPHEN 5-325 MG PO TABS: 1.0000 | ORAL_TABLET | ORAL | Status: DC | PRN

## 2011-11-27 MED ORDER — FENTANYL CITRATE 0.05 MG/ML IJ SOLN
50.0000 ug | INTRAMUSCULAR | Status: AC
  Administered 2011-11-27: 50 ug via INTRAVENOUS
  Filled 2011-11-27: qty 2

## 2011-11-27 MED ORDER — METOCLOPRAMIDE HCL 5 MG/ML IJ SOLN: 10.0000 mg | Freq: Once | INTRAMUSCULAR | Status: DC | PRN

## 2011-11-27 MED ORDER — PROPOFOL 10 MG/ML IV EMUL
INTRAVENOUS | Status: DC | PRN
  Administered 2011-11-27: 200 mg via INTRAVENOUS

## 2011-11-27 MED ORDER — MEPERIDINE HCL 25 MG/ML IJ SOLN: 6.2500 mg | INTRAMUSCULAR | Status: DC | PRN

## 2011-11-27 MED ORDER — DEXAMETHASONE SODIUM PHOSPHATE 10 MG/ML IJ SOLN
INTRAMUSCULAR | Status: AC
  Filled 2011-11-27: qty 1

## 2011-11-27 MED ORDER — HYDROMORPHONE HCL PF 1 MG/ML IJ SOLN
0.2500 mg | INTRAMUSCULAR | Status: DC | PRN
Start: 2011-11-27 — End: 2011-11-27
  Administered 2011-11-27: 0.25 mg via INTRAVENOUS
  Administered 2011-11-27: 0.5 mg via INTRAVENOUS
  Administered 2011-11-27: 0.25 mg via INTRAVENOUS
  Administered 2011-11-27 (×2): 0.5 mg via INTRAVENOUS

## 2011-11-27 SURGICAL SUPPLY — 26 items
ADH SKN CLS APL DERMABOND .7 (GAUZE/BANDAGES/DRESSINGS) ×2
BAG SPEC RTRVL LRG 6X4 10 (ENDOMECHANICALS) ×1
CHLORAPREP W/TINT 26ML (MISCELLANEOUS) ×1 IMPLANT
DERMABOND ADVANCED (GAUZE/BANDAGES/DRESSINGS) ×2
DERMABOND ADVANCED .7 DNX12 (GAUZE/BANDAGES/DRESSINGS) ×1 IMPLANT
ELECT REM PT RETURN 9FT ADLT (ELECTROSURGICAL)
ELECTRODE REM PT RTRN 9FT ADLT (ELECTROSURGICAL) IMPLANT
GLOVE BIOGEL PI IND STRL 6.5 (GLOVE) ×2 IMPLANT
GLOVE BIOGEL PI INDICATOR 6.5 (GLOVE) ×2
GLOVE SURG SS PI 6.0 STRL IVOR (GLOVE) ×2 IMPLANT
GLOVE SURG SS PI 7.5 STRL IVOR (GLOVE) ×2 IMPLANT
GOWN PREVENTION PLUS LG XLONG (DISPOSABLE) ×5 IMPLANT
NS IRRIG 1000ML POUR BTL (IV SOLUTION) ×2 IMPLANT
PACK LAPAROSCOPY BASIN (CUSTOM PROCEDURE TRAY) ×2 IMPLANT
POUCH SPECIMEN RETRIEVAL 10MM (ENDOMECHANICALS) ×1 IMPLANT
PROTECTOR NERVE ULNAR (MISCELLANEOUS) ×1 IMPLANT
SEALER TISSUE G2 CVD JAW 35 (ENDOMECHANICALS) IMPLANT
SEALER TISSUE G2 CVD JAW 45CM (ENDOMECHANICALS) ×1
SET IRRIG TUBING LAPAROSCOPIC (IRRIGATION / IRRIGATOR) ×1 IMPLANT
SUT MNCRL AB 4-0 PS2 18 (SUTURE) ×2 IMPLANT
SUT VICRYL 0 UR6 27IN ABS (SUTURE) ×4 IMPLANT
TOWEL OR 17X24 6PK STRL BLUE (TOWEL DISPOSABLE) ×4 IMPLANT
TROCAR BALLN 12MMX100 BLUNT (TROCAR) IMPLANT
TROCAR XCEL NON-BLD 11X100MML (ENDOMECHANICALS) ×1 IMPLANT
TROCAR XCEL NON-BLD 5MMX100MML (ENDOMECHANICALS) ×2 IMPLANT
WATER STERILE IRR 1000ML POUR (IV SOLUTION) ×2 IMPLANT

## 2011-11-27 NOTE — Addendum Note (Signed)
Addendum  created 11/27/11 1710 by Tyrone Apple. Malen Gauze, MD   Modules edited:Notes Section

## 2011-11-27 NOTE — Discharge Instructions (Signed)
Diagnostic Laparoscopy Laparoscopy is a surgical procedure. It is used to diagnose and treat diseases inside the belly(abdomen). It is usually a brief, common, and relatively simple procedure. The laparoscopeis a thin, lighted, pencil-sized instrument. It is like a telescope. It is inserted into your abdomen through a small cut (incision). Your caregiver can look at the organs inside your body through this instrument. He or she can see if there is anything abnormal. Laparoscopy can be done either in a hospital or outpatient clinic. You may be given a mild sedative to help you relax before the procedure. Once in the operating room, you will be given a drug to make you sleep (general anesthesia). Laparoscopy usually lasts less than 1 hour. After the procedure, you will be monitored in a recovery area until you are stable and doing well. Once you are home, it will take 2 to 3 days to fully recover.  HOME CARE INSTRUCTIONS   Take all medicines as directed.   Only take over-the-counter or prescription medicines for pain, discomfort, or fever as directed by your caregiver.   Resume daily activities as directed.   Showers are preferred over baths.   You may resume sexual activities in 1 week or as directed.   Do not drive while taking narcotics.  SEEK MEDICAL CARE IF:   There is increasing abdominal pain.   There is new pain in the shoulders (shoulder strap areas).   You feel lightheaded or faint.   You have the chills.   You or your child has an oral temperature above 102 F (38.9 C).   There is pus-like (purulent) drainage from any of the wounds.   You are unable to pass gas or have a bowel movement.   You feel sick to your stomach (nauseous) or throw up (vomit).  MAKE SURE YOU:   Understand these instructions.   Will watch your condition.   Will get help right away if you are not doing well or get worse.  Document Released: 12/20/2000 Document Revised: 05/26/2011 Document  Reviewed: 09/13/2007 [facility] Patient Information 2012 [place], [place].

## 2011-11-27 NOTE — ED Provider Notes (Signed)
History       HPI 31 yo [id number] presents to MAU with severe lower and left sided abdominal pain.  She presented to MAU yesterday for abdominal pain and was diagnosed with bilateral ectopic pregnancy and given a dose of Methotrexate.   OB History    Grav Para Term Preterm Abortions TAB SAB Ect Mult Living   7 5   1     5       Past Medical History  Diagnosis Date  . Herpes 2002  . Trichimoniasis 12-2010  . Infection     Past Surgical History  Procedure Date  . Tubal ligation 12-05-2010    Family History  Problem Relation Age of Onset  . Anesthesia problems Neg Hx     History  Substance Use Topics  . Smoking status: Never Smoker   . Smokeless tobacco: Never Used  . Alcohol Use: No    Allergies: No Known Allergies  No prescriptions prior to admission    Review of Systems  Constitutional: Negative for fever, chills and malaise/fatigue.  Eyes: Negative for blurred vision.  Respiratory: Negative for cough and shortness of breath.   Cardiovascular: Negative for chest pain.  Gastrointestinal: Positive for nausea, vomiting and abdominal pain.  Genitourinary: Negative for dysuria.  Musculoskeletal: Negative for myalgias.  Neurological: Positive for dizziness and weakness. Negative for headaches.  Psychiatric/Behavioral: Negative for depression.   Physical Exam   Patient Vitals for the past 24 hrs:  BP Temp Temp src Pulse Resp  11/27/11 1102 109/56 mmHg - - 74  18   11/27/11 0954 144/121 mmHg 98.8 F (37.1 C) Oral - 22     Physical Exam  Nursing note and vitals reviewed. GI: Soft. Bowel sounds are normal. There is tenderness in the right lower quadrant, suprapubic area and left lower quadrant. There is no rigidity, no rebound, no guarding, no CVA tenderness, no tenderness at McBurney's point and negative Murphy's sign.    Skin: Skin is warm and dry. There is pallor.   Results for orders placed during the hospital encounter of 11/27/11 (from the past 24  hour(s))  CBC     Status: Abnormal   Collection Time   11/27/11 10:18 AM      Component Value Range   WBC 6.6  4.0 - 10.5 (K/uL)   RBC 3.86 (*) 3.87 - 5.11 (MIL/uL)   Hemoglobin 10.9 (*) 12.0 - 15.0 (g/dL)   HCT 09.8 (*) 11.9 - 46.0 (%)   MCV 87.0  78.0 - 100.0 (fL)   MCH 28.2  26.0 - 34.0 (pg)   MCHC 32.4  30.0 - 36.0 (g/dL)   RDW 14.7  82.9 - 56.2 (%)   Platelets 296  150 - 400 (K/uL)  ABO/RH     Status: Normal   Collection Time   11/27/11 10:18 AM      Component Value Range   ABO/RH(D) O POS      MAU Course  Procedures CBC, ABO/RH, IV fluids, IV fentanyl  MDM Called Dr Jolayne Panther with U/S and lab findings.    Assessment and Plan  Care assumed by Dr Jolayne Panther at 12:30 with plan to go to OR.  LEFTWICH-KIRBY, Luva Metzger 11/27/2011, 11:00 AM

## 2011-11-27 NOTE — Anesthesia Procedure Notes (Signed)
Procedure Name: Intubation Date/Time: 11/27/2011 1:40 PM Performed by: Lincoln Brigham Pre-anesthesia Checklist: Patient identified, Patient being monitored, Emergency Drugs available, Timeout performed and Suction available Patient Re-evaluated:Patient Re-evaluated prior to inductionOxygen Delivery Method: Circle system utilized Preoxygenation: Pre-oxygenation with 100% oxygen Intubation Type: IV induction Ventilation: Mask ventilation without difficulty Laryngoscope Size: Miller and 2 Grade View: Grade II Tube type: Oral Tube size: 7.0 mm Number of attempts: 1 Airway Equipment and Method: Stylet Placement Confirmation: ETT inserted through vocal cords under direct vision,  breath sounds checked- equal and bilateral and positive ETCO2 Secured at: 21 cm Tube secured with: Tape Dental Injury: Teeth and Oropharynx as per pre-operative assessment

## 2011-11-27 NOTE — Transfer of Care (Signed)
Immediate Anesthesia Transfer of Care Note  Patient: Theresa Mooney  Procedure(s) Performed: Procedure(s) (LRB): LAPAROSCOPY OPERATIVE (Bilateral)  Patient Location: PACU  Anesthesia Type: General  Level of Consciousness: awake, alert  and oriented  Airway & Oxygen Therapy: Patient Spontanous Breathing and Patient connected to nasal cannula oxygen  Post-op Assessment: Report given to PACU RN and Post -op Vital signs reviewed and stable  Post vital signs: stable  Complications: No apparent anesthesia complications

## 2011-11-27 NOTE — Op Note (Signed)
Theresa Mooney PROCEDURE DATE: 11/27/2011  PREOPERATIVE DIAGNOSIS: Ruptured bilateral ectopic pregnancy POSTOPERATIVE DIAGNOSIS: Ruptured bilateral fallopian tube ectopic pregnancy PROCEDURE: Laparoscopic bilateral salpingectomy and removal of ectopic pregnancy SURGEON:  Dr. Catalina Antigua ASSISTANT: Dr. Candelaria Celeste ANESTHESIOLOGIST: Tyrone Apple. Malen Gauze, MD Pat Patrick, CRNA - CRNA Tyrone Apple. Malen Gauze, MD - Anesthesiologist Rica Records, CRNA - CRNA Rica Records, CRNA - CRNA  INDICATIONS: 31 y.o. [phone] at [redacted]w[redacted]d by dates with h/o postpartum tubal ligation here with ruptured bilateral tubal ectopic pregnancy. On exam, she had stable vital signs, and an acute abdomen. Hgb  Lab Results  Component Value Date   HGB 10.9* 11/27/2011   , blood type O POS . Patient was counseled regarding need for laparoscopic salpingectomy. Risks of surgery including bleeding which may require transfusion or reoperation, infection, injury to bowel or other surrounding organs, need for additional procedures including laparotomy and other postoperative/anesthesia complications were explained to patient.  Written informed consent was obtained.  FINDINGS:  Moderate amount of hemoperitoneum estimated to be about 200 cc of blood and clots.  Dilated right and left fallopian tube containing ectopic gestation. Small normal appearing uterus,right ovary and left ovary.  ANESTHESIA: General INTRAVENOUS FLUIDS: .1000 ml ESTIMATED BLOOD LOSS: 50 ml URINE OUTPUT: 300 ml SPECIMENS: right and left fallopian tube containing ectopic gestation COMPLICATIONS: None immediate  PROCEDURE IN DETAIL:  The patient was taken to the operating room where general anesthesia was administered and was found to be adequate.  She was placed in the dorsal lithotomy position, and was prepped and draped in a sterile manner.  A Foley catheter was inserted into her bladder and attached to Caycee Wanat drainage and a uterine manipulator was then  advanced into the uterus .  After an adequate timeout was performed, attention was then turned to the patient's abdomen where a 10-mm skin incision was made on the umbilical fold.  The fascia was identified, tented up and incised with Mayo scissors. The fascia was tagged with 0- Vicryl. The peritoneum was identified tented up and entered sharply with Metzenbaum scissors.  10-mm trocar and sleeve were then advanced without difficulty into the abdomen where intraabdominal placement was confirmed by the laparoscope. Pneumoperitoneum was achieved with insufflation of CO2 gas. A survey of the patient's pelvis and abdomen revealed the findings as above.  Bilateral lower quadrant ports were placed under direct visualization; 5-mm on the left and 5-mm on the right.  The 5-mm Nezhat suction irrigator was then used to suction the hemoperitoneum and irrigate the pelvis.  Attention was then turned to the right fallopian tube which was grasped and ligated from the underlying mesosalpinx and uterine attachment using the Enseal instrument.  Good hemostasis was noted.  Similar technique was used to remove the left fallopian tube. The specimens were placed in an EndoCatch bag and removed from the abdomen intact.  The abdomen was desufflated, and all instruments were removed.  The fascial incisions of both 10-mm sites were reapproximated with 0 Vicryl figure-of-eight stiches; and all skin incisions were closed with a 3-0 Vicryl subcuticular stitch. The patient tolerated the procedures well.  All instruments, needles, and sponge counts were correct x 2. The patient was taken to the recovery room in stable condition.    The patient will be discharged to home as per PACU criteria.  Routine postoperative instructions given.  She was prescribed Percocet, Ibuprofen and Colace.  She will follow up in the clinic in 2 weeks for postoperative evaluation .

## 2011-11-27 NOTE — H&P (Signed)
HPI  pt is pregnant with history of PP tubal ligation March 2012 by Dr. Gaynell Face. (Pt is no longer a patient in their practice) Her LMP was ? 1/22. Patient was seen yesterday and diagnosed with bilateral ectopic pregnancies treated with methotrexate. She complains worsening lower abdominal pain. That radiates to her back. The pain is worse when she laughs.  She has not taken any medication for her pain.    Past Medical History   Diagnosis  Date   .  Herpes  2002   .  Trichimoniasis  12-2010   .  Infection     Past Surgical History   Procedure  Date   .  Tubal ligation  12-05-2010    Family History   Problem  Relation  Age of Onset   .  Anesthesia problems  Neg Hx     History   Substance Use Topics   .  Smoking status:  Never Smoker   .  Smokeless tobacco:  Never Used   .  Alcohol Use:  No    Allergies: No Known Allergies  No prescriptions prior to admission    Review of Systems  Constitutional: Negative for fever and chills.  Gastrointestinal: Positive for nausea and abdominal pain. Negative for vomiting, diarrhea and constipation.  Genitourinary: Negative for dysuria and urgency.  Neurological: Negative for headaches.   Physical Exam   Blood pressure 109/56, pulse 74, resp. rate 18, height 5' 9.5" (1.765 m), weight 168 lb 2 oz (76.261 kg), last menstrual period 10/19/2011.   Physical Exam  Nursing note and vitals reviewed.  Constitutional: She is oriented to person, place, and time. She appears well-developed and well-nourished.  HENT:  Head: Normocephalic.  Eyes: Pupils are equal, round, and reactive to light.  Neck: Normal range of motion. Neck supple.  Cardiovascular: Normal rate.  Respiratory: Effort normal.  GI: Soft. She exhibits no distension. There is no tenderness. There is no rebound and no guarding.  Genitourinary: Vagina normal and uterus normal. No vaginal discharge found.  nontender bimanual; no rebound tenderness  Musculoskeletal: Normal range of  motion.  Neurological: She is alert and oriented to person, place, and time.  Skin: Skin is warm and dry.   MAU Course   Procedures  Results for orders placed during the hospital encounter of 11/26/11 (from the past 24 hour(s))   URINALYSIS, ROUTINE W REFLEX MICROSCOPIC Status: Abnormal    Collection Time    11/26/11 4:48 PM   Component  Value  Range    Color, Urine  YELLOW  YELLOW    APPearance  CLEAR  CLEAR    Specific Gravity, Urine  >1.030 (*)  1.005 - 1.030    pH  6.0  5.0 - 8.0    Glucose, UA  NEGATIVE  NEGATIVE (mg/dL)    Hgb urine dipstick  MODERATE (*)  NEGATIVE    Bilirubin Urine  NEGATIVE  NEGATIVE    Ketones, ur  NEGATIVE  NEGATIVE (mg/dL)    Protein, ur  NEGATIVE  NEGATIVE (mg/dL)    Urobilinogen, UA  0.2  0.0 - 1.0 (mg/dL)    Nitrite  NEGATIVE  NEGATIVE    Leukocytes, UA  NEGATIVE  NEGATIVE   URINE MICROSCOPIC-ADD ON Status: Abnormal    Collection Time    11/26/11 4:48 PM   Component  Value  Range    Squamous Epithelial / LPF  MANY (*)  RARE    WBC, UA  0-2  <3 (WBC/hpf)    RBC /  HPF  0-2  <3 (RBC/hpf)    Bacteria, UA  FEW (*)  RARE    Urine-Other  MUCOUS PRESENT    CBC Status: Abnormal    Collection Time    11/26/11 5:39 PM   Component  Value  Range    WBC  6.2  4.0 - 10.5 (K/uL)    RBC  3.87  3.87 - 5.11 (MIL/uL)    Hemoglobin  10.9 (*)  12.0 - 15.0 (g/dL)    HCT  16.1 (*)  09.6 - 46.0 (%)    MCV  87.1  78.0 - 100.0 (fL)    MCH  28.2  26.0 - 34.0 (pg)    MCHC  32.3  30.0 - 36.0 (g/dL)    RDW  04.5  40.9 - 81.1 (%)    Platelets  305  150 - 400 (K/uL)   HCG, QUANTITATIVE, PREGNANCY Status: Abnormal    Collection Time    11/26/11 5:41 PM   Component  Value  Range    hCG, Beta Chain, Quant, S  5742 (*)  <5 (mIU/mL)   BUN Status: Normal    Collection Time    11/26/11 5:41 PM   Component  Value  Range    BUN  11  6 - 23 (mg/dL)   CREATININE, SERUM Status: Normal    Collection Time    11/26/11 5:41 PM   Component  Value  Range    Creatinine, Ser  0.71  0.50 -  1.10 (mg/dL)    GFR calc non Af Amer  >90  >90 (mL/min)    GFR calc Af Amer  >90  >90 (mL/min)   WET PREP, GENITAL Status: Abnormal    Collection Time    11/26/11 5:55 PM   Component  Value  Range    Yeast Wet Prep HPF POC  NONE SEEN  NONE SEEN    Trich, Wet Prep  NONE SEEN  NONE SEEN    Clue Cells Wet Prep HPF POC  MODERATE (*)  NONE SEEN    WBC, Wet Prep HPF POC  MODERATE (*)  NONE SEEN    [place] 3/2: Bilateral ectopic pregnancies with moderate amount of complex fluid in cul de sac  A/P 31 yo [id number] with bilateral ectopic pregnancies with evidence of rupture - Surgical treatment with bilateral salpingectomy discussed. Risk, benefits and alternatives explained, including but not limited to risk of bleeding, infection and damage to adjacent organs. Patient verbalized understanding and all questions were answered. - It was explained to the patient that there is a great likelihood that her ectopic pregnancies will be resolved following this procedure.

## 2011-11-27 NOTE — Anesthesia Preprocedure Evaluation (Addendum)
Anesthesia Evaluation  Patient identified by MRN, date of birth, ID band Patient awake    Reviewed: Allergy & Precautions, H&P , NPO status , Patient's Chart, lab work & pertinent test results  Airway Mallampati: II TM Distance: >3 FB Neck ROM: full    Dental No notable dental hx. (+) Teeth Intact   Pulmonary neg pulmonary ROS,  breath sounds clear to auscultation  Pulmonary exam normal       Cardiovascular negative cardio ROS  Rhythm:regular Rate:Normal     Neuro/Psych negative neurological ROS  negative psych ROS   GI/Hepatic negative GI ROS, Neg liver ROS,   Endo/Other  negative endocrine ROS  Renal/GU negative Renal ROS  negative genitourinary   Musculoskeletal negative musculoskeletal ROS (+)   Abdominal Normal abdominal exam  (+)   Peds  Hematology negative hematology ROS (+)   Anesthesia Other Findings   Reproductive/Obstetrics (+) Pregnancy                          Anesthesia Physical Anesthesia Plan  ASA: II and Emergent  Anesthesia Plan: General   Post-op Pain Management:    Induction: Cricoid pressure planned, Rapid sequence and Intravenous  Airway Management Planned: Oral ETT  Additional Equipment:   Intra-op Plan:   Post-operative Plan: Extubation in OR  Informed Consent: I have reviewed the patients History and Physical, chart, labs and discussed the procedure including the risks, benefits and alternatives for the proposed anesthesia with the patient or authorized representative who has indicated his/her understanding and acceptance.   Dental advisory given  Plan Discussed with: Anesthesiologist, Surgeon and CRNA  Anesthesia Plan Comments:        Anesthesia Quick Evaluation

## 2011-11-27 NOTE — Anesthesia Postprocedure Evaluation (Addendum)
  Anesthesia Post-op Note  Patient: Theresa Mooney  Procedure(s) Performed: Procedure(s) (LRB): LAPAROSCOPY OPERATIVE (Bilateral)  Patient Location: PACU  Anesthesia Type: General  Level of Consciousness: awake, alert  and oriented  Airway and Oxygen Therapy: Patient Spontanous Breathing and Patient connected to face mask oxygen  Post-op Pain: 7 /10  Post-op Assessment: Post-op Vital signs reviewed, Patient's Cardiovascular Status Stable, Respiratory Function Stable, Patent Airway, No signs of Nausea or vomiting and Pain level controlled  Post-op Vital Signs: Reviewed and stable  Complications: No apparent anesthesia complications. Patient wishes to be admitted

## 2011-11-28 LAB — CBC
HCT: 27.5 % — ABNORMAL LOW (ref 36.0–46.0)
Hemoglobin: 8.9 g/dL — ABNORMAL LOW (ref 12.0–15.0)
MCH: 28.3 pg (ref 26.0–34.0)
MCHC: 32.4 g/dL (ref 30.0–36.0)
RDW: 13.6 % (ref 11.5–15.5)

## 2011-11-28 MED ORDER — IBUPROFEN 600 MG PO TABS: 600.0000 mg | ORAL_TABLET | Freq: Four times a day (QID) | ORAL | Status: DC | PRN

## 2011-11-28 MED ORDER — OXYCODONE-ACETAMINOPHEN 5-325 MG PO TABS: 1.0000 | ORAL_TABLET | ORAL | Status: AC | PRN

## 2011-11-28 MED ORDER — SIMETHICONE 80 MG PO CHEW: 80.0000 mg | CHEWABLE_TABLET | Freq: Four times a day (QID) | ORAL | Status: DC | PRN

## 2011-11-28 MED ORDER — FLAVOXATE HCL 100 MG PO TABS: 100.0000 mg | ORAL_TABLET | Freq: Three times a day (TID) | ORAL | Status: DC | PRN

## 2011-11-28 MED ORDER — MENTHOL 3 MG MT LOZG
1.0000 | LOZENGE | OROMUCOSAL | Status: DC | PRN
  Filled 2011-11-28: qty 1

## 2011-11-28 NOTE — Progress Notes (Signed)
Per Dr. Penne Lash, foley catheter placed (14 french) to gravity, 450cc clear, urine in bag. Pt tolerated well. Pt stated she does not need the leg bag. States she is ready to go home. Asked pt if she was going to be able to afford the medication, states "I am going to have him (boyfriend) pay for the medications." Dr. Penne Lash paged. MD will call back. Will continue to monitor.

## 2011-11-28 NOTE — Plan of Care (Signed)
Problem: Phase II Progression Outcomes Goal: Foley discontinued Outcome: Not Met (add Reason) Unable to void foley placed and sent home with catheter

## 2011-11-28 NOTE — Progress Notes (Signed)
Pt d/c home with family via wc, to private car. D/c instructions given and reviewed with pt on the care and maintenance of foley catheter. Informed pt that the clinic will call her to schedule her follow-up and removal of catheter tomorrow. Pt verbalized and demonstrated understanding. Prescriptions reviewed pt verbalized understanding.

## 2011-11-28 NOTE — Progress Notes (Signed)
Pt unable to void @0730  bladder scan 197cc. Dr. Jolayne Panther notified. Pt c/o sore throat requesting throat lozenges. Will continue to monitor.

## 2011-11-28 NOTE — Progress Notes (Signed)
Pt unable to void, bladder scan 325cc. Dr. Penne Lash notified. Orders given to insert foley. MD will see pt on unit. Will continue to monitor.

## 2011-11-28 NOTE — Progress Notes (Signed)
1 Day Post-Op Procedure(s) (LRB): LAPAROSCOPY OPERATIVE (Bilateral)  Subjective: Patient opted to stay overnight secondary to post-operative pain and being alone at home. Patient reports generalized abdominal soreness. Patient ambulated without difficulty yesterday, tolerated a regular diet. Pain well controlled with current oral pain management. Patient unable to void yesterday evening. Foley catheter placed for 5 hours and removed this am.    Objective: I have reviewed patient's vital signs, intake and output and labs.  General: alert, cooperative and no distress Resp: clear to auscultation bilaterally Cardio: regular rate and rhythm GI: soft, non-tender; bowel sounds normal; no masses,  no organomegaly and incision: clean, dry and intact Extremities: Homans sign is negative, no sign of DVT and no edema, redness or tenderness in the calves or thighs  Assessment: s/p Procedure(s) (LRB): LAPAROSCOPY OPERATIVE (Bilateral): stable  Plan: Discharge home Patient encouraged to try to void. If still unable to void will discharge with a leg bag with follow-up in GYN clinic on Monday.  LOS: 1 day    Ayde Record 11/28/2011, 7:07 AM

## 2011-11-28 NOTE — Discharge Summary (Signed)
Physician Discharge Summary  Patient ID: Theresa Mooney MRN: [id number] DOB/AGE: [birth year] 31 y.o.  Admit date: 11/27/2011 Discharge date: 11/28/2011  Admission Diagnoses: ruptured bilateral ectopic pregnancy  Discharge Diagnoses: same, s/p laparoscopic bilateral salpingectomy Active Problems:  Ectopic pregnancy, tubal   Discharged Condition: good  Hospital Course: Patient with clinical and sonographic evidence of ruptured bilateral ectopic pregnancy underwent laparoscopic bilateral salpingectomy and evacuation of hemoperitoneum. While in recovery room, patient expressed concerns regarding not being able to care for herself and her 5 children due to pain and requested to be admitted. Her hospital course remained uneventful with the exception of urinary retention overnight. Patient otherwise remained hemodynamically stable and found stable for discharge on POD#1  Consults: None  Significant Diagnostic Studies: labs: pre-op Hg 10.9--> POD#1 Hg 9.6  Treatments: surgery: LSC bilateral salpingectomy and bladder rest with foley catheter  Discharge Exam: Blood pressure 90/58, pulse 99, temperature 99.2 F (37.3 C), temperature source Oral, resp. rate 18, last menstrual period 10/19/2011, SpO2 96.00%. General appearance: alert, cooperative and no distress GI: soft, non-tender; bowel sounds normal; no masses,  no organomegaly and incisions: no erthyema, indurations or drainage Extremities: Homans sign is negative, no sign of DVT and no edema, redness or tenderness in the calves or thighs  Disposition: 01-Home or Self Care   Medication List  As of 11/28/2011  7:12 AM   TAKE these medications         docusate sodium 100 MG capsule   Commonly known as: COLACE   Take 1 capsule (100 mg total) by mouth 2 (two) times daily as needed for constipation.      ibuprofen 600 MG tablet   Commonly known as: ADVIL,MOTRIN   Take 1 tablet (600 mg total) by mouth every 6 (six) hours as needed for pain.      oxyCODONE-acetaminophen 5-325 MG per tablet   Commonly known as: PERCOCET   Take 1 tablet by mouth every 4 (four) hours as needed for pain.           Follow-up Information    Follow up with [facility]. (You will be contacted with an appointment)    Contact information:   [address] [place] [address] [phone]         Signed: Daryon Remmert 11/28/2011, 7:12 AM

## 2011-11-29 ENCOUNTER — Encounter (HOSPITAL_COMMUNITY): Payer: Self-pay | Admitting: Obstetrics and Gynecology

## 2011-11-29 ENCOUNTER — Inpatient Hospital Stay (HOSPITAL_COMMUNITY)
Admission: AD | Admit: 2011-11-29 | Discharge: 2011-11-29 | Disposition: A | Discharge status: Home / Self Care | Payer: Self-pay | Source: Ambulatory Visit | Attending: Obstetrics and Gynecology | Admitting: Obstetrics and Gynecology

## 2011-11-29 DIAGNOSIS — R339 Retention of urine, unspecified: Secondary | ICD-10-CM

## 2011-11-29 DIAGNOSIS — G8918 Other acute postprocedural pain: Secondary | ICD-10-CM | POA: Insufficient documentation

## 2011-11-29 DIAGNOSIS — IMO0002 Reserved for concepts with insufficient information to code with codable children: Secondary | ICD-10-CM | POA: Insufficient documentation

## 2011-11-29 DIAGNOSIS — N9989 Other postprocedural complications and disorders of genitourinary system: Secondary | ICD-10-CM | POA: Insufficient documentation

## 2011-11-29 LAB — POCT PREGNANCY, URINE: Preg Test, Ur: POSITIVE — AB

## 2011-11-29 LAB — URINALYSIS, ROUTINE W REFLEX MICROSCOPIC
Bilirubin Urine: NEGATIVE
Glucose, UA: NEGATIVE mg/dL
Ketones, ur: NEGATIVE mg/dL
Protein, ur: NEGATIVE mg/dL
pH: 6.5 (ref 5.0–8.0)

## 2011-11-29 NOTE — Progress Notes (Signed)
Sent home yesterday with catheter.  Feels like an infection is coming on, started bleeding yesterday- not heavy.  Was to follow up on Wed.  Pain is ? In uterus, when stand is worse, lots of pressure like needs to pee. ( explained to pt about catheter/ balloon in bladder)

## 2011-11-29 NOTE — ED Provider Notes (Signed)
History     30 [id number] presents to MAU 2 days following laparoscopy for bilateral ectopic pregnancy with report of foley catheter causing bladder pain/pressure.  She was discharged with foley catheter because she was unable to void prior to hospital discharge.  Her f/u appointment is scheduled for Wednesday to evaluate voiding.  She denies abdominal or back pain, vaginal bleeding, h/a, dizziness, n/v, or fatigue.  HPI  OB History    Grav Para Term Preterm Abortions TAB SAB Ect Mult Living   7 5   2 1  1  5       Past Medical History  Diagnosis Date  . Herpes 2002  . Trichimoniasis 12-2010  . Infection     Past Surgical History  Procedure Date  . Tubal ligation 12-05-2010  . Laparoscopy 11/27/2011    Procedure: LAPAROSCOPY OPERATIVE;  Surgeon: Catalina Antigua, MD;  Location: [facility];  Service: Gynecology;  Laterality: Bilateral;  . Dilation and curettage of uterus   . Salpingectomy     Bilateral for twin ectopic preg.     Family History  Problem Relation Age of Onset  . Anesthesia problems Neg Hx     History  Substance Use Topics  . Smoking status: Never Smoker   . Smokeless tobacco: Never Used  . Alcohol Use: No    Allergies: No Known Allergies  Prescriptions prior to admission  Medication Sig Dispense Refill  . docusate sodium (COLACE) 100 MG capsule Take 100 mg by mouth 2 (two) times daily as needed. Has not picked up      . flavoxATE (URISPAS) 100 MG tablet Take 100 mg by mouth 3 (three) times daily as needed. Has not picked up      . ibuprofen (ADVIL,MOTRIN) 600 MG tablet Take 1 tablet (600 mg total) by mouth every 6 (six) hours as needed for pain.  30 tablet  1  . oxyCODONE-acetaminophen (PERCOCET) 5-325 MG per tablet Take 1-2 tablets by mouth every 3 (three) hours as needed (moderate to severe pain (when tolerating fluids)).  30 tablet  0  . DISCONTD: docusate sodium (COLACE) 100 MG capsule Take 1 capsule (100 mg total) by mouth 2 (two) times daily as needed for  constipation.  30 capsule  2  . DISCONTD: flavoxATE (URISPAS) 100 MG tablet Take 1 tablet (100 mg total) by mouth 3 (three) times daily as needed.  30 tablet  0    Review of Systems  Constitutional: Negative for fever, chills and malaise/fatigue.  Eyes: Negative for blurred vision.  Respiratory: Negative for cough and shortness of breath.   Cardiovascular: Negative for chest pain.  Gastrointestinal: Negative for nausea, vomiting and abdominal pain.  Genitourinary: Positive for urgency.       Suprapubic pain and pressure worsening since foley catheter inserted  Musculoskeletal: Negative.   Neurological: Negative for dizziness and headaches.  Psychiatric/Behavioral: Negative for depression.   Physical Exam   Blood pressure 116/52, pulse 76, temperature 98 F (36.7 C), temperature source Oral, resp. rate 20, last menstrual period 10/19/2011, unknown if currently breastfeeding.  Physical Exam  Nursing note and vitals reviewed. Constitutional: She is oriented to person, place, and time. She appears well-developed and well-nourished.  Neck: Normal range of motion.  Cardiovascular: Normal rate, regular rhythm and normal heart sounds.   Respiratory: Effort normal.  GI: Soft. Bowel sounds are normal. She exhibits no distension and no mass. There is no tenderness. There is no rebound and no guarding.  Genitourinary:  Suprapubic tenderness upon palpation.  External urethral opening without edema or redness, no blood visible in urine  Musculoskeletal: Normal range of motion.  Neurological: She is alert and oriented to person, place, and time.  Skin: Skin is warm and dry.  Psychiatric: She has a normal mood and affect. Her behavior is normal. Thought content normal.    MAU Course  Procedures U/A  MDM Plan to remove foley catheter in MAU.  If pt able to void, d/c home without catheter.  If unable to void, will insert new catheter for pt to f/u as scheduled.  Pt voided moderate  amount without difficulty in MAU.    Assessment and Plan  A: Urinary retention following surgery, resolved  P: D/C home Return to MAU if unable to void x6 hours or greater or urinary/suprapubic discomfort Keep f/u appointment on Wednesday  LEFTWICH-KIRBY, Jeret Goyer 11/29/2011, 1:32 PM

## 2011-11-29 NOTE — ED Provider Notes (Signed)
Agree with above note.  Theresa Mooney 11/29/2011 3:01 PM

## 2011-11-29 NOTE — Discharge Instructions (Signed)
Acute Urinary Retention, Female You have been seen by a caregiver today because of your inability to urinate (pass your water). This is an uncommon problem in females. CAUSES  It can be caused by:  Infection.   A side effect of a medication.   A problem in a nearby organ that presses or squeezes on the bladder or the urethra (the tube that drains the bladder).   Psychological problems.  TREATMENT  Treatment may involve a one-time placement of a tube in the bladder to empty it. It may be a problem that does not recur for years. You and your caregiver can decide how to handle this problem in follow-up. You may need to be referred to a specialist for additional examination and tests.  HOME CARE INSTRUCTIONS  If you are to leave the foley catheter (a long, narrow, hollow tube) in and go home with a drainage system, you will need to discuss the best course of action with your caregiver. While the catheter is in, maintain a good intake of fluids. Keep the drainage bag emptied and lower than your catheter. This is so contaminated (infected) urine will not flow back into your bladder. This could lead to a urinary tract infection. Only take over-the-counter or prescription medicines for pain, discomfort, or fever as directed by your caregiver.  SEEK IMMEDIATE MEDICAL CARE IF:  You develop chills, fever, or show signs of generalized illness that occurs prior to seeing your caregiver. Document Released: 09/12/2006 Document Revised: 09/02/2011 Document Reviewed: 08/15/2009 [employer] Patient Information 2012 [employer]. 

## 2011-11-29 NOTE — Progress Notes (Signed)
Pt states was given foley cath because she could not pee on her own. Was discharged Sunday. Here with what pt describes as uti feeling. Feels pressure with standing or moving. Tried to pull out catheter at home, mother advised against it. Was here for ectopic twin pregnancy surgery to both left and right tubes, which had to be removed. Has bilateral lower abdominal incision as well as umbilical incision, which appear to be healing well. Bleeding scant amount

## 2011-12-01 ENCOUNTER — Other Ambulatory Visit: Payer: Self-pay

## 2011-12-01 NOTE — ED Provider Notes (Signed)
Agree with above note.  Theresa Mooney 12/01/2011 7:44 AM

## 2011-12-01 NOTE — Progress Notes (Signed)
Pt came in for post void residual.  Pt was given 12oz water and she consumed 118cc of water.   Pt excreted 105cc of urine.  In and out cath 10cc.  Pt tolerated well.  Advised pt to keep post op appt.  Pt stated understanding and had no further questions. Per Ulla Potash, PA was instructed it was ok to go home.

## 2011-12-08 ENCOUNTER — Telehealth: Payer: Self-pay | Admitting: *Deleted

## 2011-12-08 NOTE — Telephone Encounter (Signed)
Pt notified that leave of absence papers have been completed. There is information which needs to be completed by her before submitting to employer. Pt states she will pick up the papers today after 1:00 pm.

## 2011-12-13 ENCOUNTER — Encounter: Payer: Self-pay | Admitting: Obstetrics and Gynecology

## 2011-12-13 ENCOUNTER — Ambulatory Visit (INDEPENDENT_AMBULATORY_CARE_PROVIDER_SITE_OTHER): Payer: Self-pay | Admitting: Obstetrics and Gynecology

## 2011-12-13 VITALS — BP 123/82 | HR 64 | Temp 96.7°F | Ht 59.5 in | Wt 170.2 lb

## 2011-12-13 DIAGNOSIS — Z09 Encounter for follow-up examination after completed treatment for conditions other than malignant neoplasm: Secondary | ICD-10-CM

## 2011-12-13 DIAGNOSIS — Z8759 Personal history of other complications of pregnancy, childbirth and the puerperium: Secondary | ICD-10-CM

## 2011-12-13 DIAGNOSIS — Z302 Encounter for sterilization: Secondary | ICD-10-CM | POA: Insufficient documentation

## 2011-12-13 DIAGNOSIS — Z8742 Personal history of other diseases of the female genital tract: Secondary | ICD-10-CM

## 2011-12-13 NOTE — Progress Notes (Signed)
31 yo [id number] s/p Laparoscopic bilateral salpingectomy on 11/27/2011 for treatment of ruptured ectopic pregnancy. Patient doing well post-operatively and denies fever, chills, or abnormal discharge from her incisions. Patient not using pain medication at this time. Patient had post-operative course complicated by urinary retention. Patient was discharge with a foley catheter which was discontinued 2 days later in the MAU. Patient has been voiding well without difficulty. Pathology results reviewed and explained to the patient.  Patient had previously had a postpartum bilateral tubal ligation a year ago and given her b/l salpingectomy understands that pregnancy will not be able to be achieved naturally.  GENERAL: Well-developed, well-nourished female in no acute distress.  ABDOMEN: Soft, nontender, nondistended. No organomegaly.   Incision: no erythema, induration or drainage EXTREMITIES: No cyanosis, clubbing, or edema, 2+ distal pulses.  A/P 31 yo [id number] s/p treatment of ectopic pregnancy on 3/2 via laparoscopy - Patient medically cleared to resume all activities of daily living - patient to schedule annual exam with PCP

## 2011-12-27 ENCOUNTER — Telehealth: Payer: Self-pay | Admitting: Obstetrics and Gynecology

## 2011-12-27 ENCOUNTER — Encounter: Payer: Self-pay | Admitting: Obstetrics and Gynecology

## 2011-12-27 NOTE — Telephone Encounter (Signed)
Patient called requesting a letter stating that she is cleared to go back to work. She was seen s/p Laparoscopic bilateral salpingectomy  12/13/2011 by Dr. Jolayne Panther and was cleared to resume all activities of daily living. Letter printed and at front desk for patient pick up as requested. Pt agrees.

## 2012-12-10 ENCOUNTER — Emergency Department (HOSPITAL_COMMUNITY)
Admission: EM | Admit: 2012-12-10 | Discharge: 2012-12-10 | Disposition: A | Discharge status: Home / Self Care | Payer: Self-pay | Attending: Emergency Medicine | Admitting: Emergency Medicine

## 2012-12-10 ENCOUNTER — Encounter (HOSPITAL_COMMUNITY): Payer: Self-pay | Admitting: *Deleted

## 2012-12-10 DIAGNOSIS — R112 Nausea with vomiting, unspecified: Secondary | ICD-10-CM | POA: Insufficient documentation

## 2012-12-10 DIAGNOSIS — B349 Viral infection, unspecified: Secondary | ICD-10-CM

## 2012-12-10 DIAGNOSIS — R197 Diarrhea, unspecified: Secondary | ICD-10-CM | POA: Insufficient documentation

## 2012-12-10 DIAGNOSIS — R6883 Chills (without fever): Secondary | ICD-10-CM | POA: Insufficient documentation

## 2012-12-10 DIAGNOSIS — B9789 Other viral agents as the cause of diseases classified elsewhere: Secondary | ICD-10-CM | POA: Insufficient documentation

## 2012-12-10 DIAGNOSIS — Z8619 Personal history of other infectious and parasitic diseases: Secondary | ICD-10-CM | POA: Insufficient documentation

## 2012-12-10 DIAGNOSIS — R630 Anorexia: Secondary | ICD-10-CM | POA: Insufficient documentation

## 2012-12-10 LAB — CBC WITH DIFFERENTIAL/PLATELET
Basophils Absolute: 0 K/uL (ref 0.0–0.1)
Basophils Relative: 0 % (ref 0–1)
Eosinophils Absolute: 0.1 K/uL (ref 0.0–0.7)
Eosinophils Relative: 1 % (ref 0–5)
HCT: 36.5 % (ref 36.0–46.0)
Hemoglobin: 12.3 g/dL (ref 12.0–15.0)
Lymphocytes Relative: 18 % (ref 12–46)
Lymphs Abs: 1.1 K/uL (ref 0.7–4.0)
MCH: 28.7 pg (ref 26.0–34.0)
MCHC: 33.7 g/dL (ref 30.0–36.0)
MCV: 85.1 fL (ref 78.0–100.0)
Monocytes Absolute: 0.4 K/uL (ref 0.1–1.0)
Monocytes Relative: 6 % (ref 3–12)
Neutro Abs: 4.7 K/uL (ref 1.7–7.7)
Neutrophils Relative %: 76 % (ref 43–77)
Platelets: 276 K/uL (ref 150–400)
RBC: 4.29 MIL/uL (ref 3.87–5.11)
RDW: 12.8 % (ref 11.5–15.5)
WBC: 6.2 K/uL (ref 4.0–10.5)

## 2012-12-10 LAB — URINALYSIS, MICROSCOPIC ONLY
Protein, ur: NEGATIVE mg/dL
Urobilinogen, UA: 1 mg/dL (ref 0.0–1.0)

## 2012-12-10 LAB — COMPREHENSIVE METABOLIC PANEL
ALT: 10 U/L (ref 0–35)
BUN: 19 mg/dL (ref 6–23)
CO2: 24 mEq/L (ref 19–32)
Calcium: 8.9 mg/dL (ref 8.4–10.5)
Creatinine, Ser: 0.71 mg/dL (ref 0.50–1.10)
GFR calc Af Amer: >90 mL/min (ref >90)
GFR calc non Af Amer: >90 mL/min (ref >90)
Glucose, Bld: 104 mg/dL — ABNORMAL HIGH (ref 70–99)

## 2012-12-10 LAB — LIPASE, BLOOD: Lipase: 22 U/L (ref 11–59)

## 2012-12-10 MED ORDER — SODIUM CHLORIDE 0.9 % IV BOLUS (SEPSIS)
1000.0000 mL | Freq: Once | INTRAVENOUS | Status: AC
  Administered 2012-12-10: 1000 mL via INTRAVENOUS

## 2012-12-10 MED ORDER — PROMETHAZINE HCL 25 MG PO TABS: 25.0000 mg | ORAL_TABLET | Freq: Four times a day (QID) | ORAL | Status: DC | PRN

## 2012-12-10 MED ORDER — KETOROLAC TROMETHAMINE 30 MG/ML IJ SOLN
30.0000 mg | Freq: Once | INTRAMUSCULAR | Status: AC
  Administered 2012-12-10: 30 mg via INTRAVENOUS
  Filled 2012-12-10: qty 1

## 2012-12-10 MED ORDER — ONDANSETRON HCL 4 MG/2ML IJ SOLN
4.0000 mg | Freq: Once | INTRAMUSCULAR | Status: AC
  Administered 2012-12-10: 4 mg via INTRAVENOUS
  Filled 2012-12-10: qty 2

## 2012-12-10 NOTE — ED Provider Notes (Signed)
History     CSN: [id number]  Arrival date & time 12/10/12  7829   First MD Initiated Contact with Patient 12/10/12 0402      Chief Complaint  Patient presents with  . Abdominal Pain   HPI  Hx provided by pt.  Pt is a 32 yo female who presents with acute onset N/V.  Symptoms began between 8-9 pm yesterday evening.  Pt reports first having nausea then followed by vomiting.  She then had abd cramps and discomfort.  No associated diarrhea.  SYmptoms also associated with decreased appetite and chills.  No fever.  Pt's 11yo and 2 yo children also with N/V/D this week.  No other sick contacts.  No other associated symptoms.  Pt has not used any tx for symptoms.    Past Medical History  Diagnosis Date  . Herpes 2002  . Trichimoniasis 12-2010  . Infection     Past Surgical History  Procedure Laterality Date  . Tubal ligation  12-05-2010  . Laparoscopy  11/27/2011    Procedure: LAPAROSCOPY OPERATIVE;  Surgeon: Catalina Antigua, MD;  Location: [facility];  Service: Gynecology;  Laterality: Bilateral;  . Dilation and curettage of uterus    . Salpingectomy      Bilateral for twin ectopic preg.     Family History  Problem Relation Age of Onset  . Anesthesia problems Neg Hx     History  Substance Use Topics  . Smoking status: Never Smoker   . Smokeless tobacco: Never Used  . Alcohol Use: No    OB History   Grav Para Term Preterm Abortions TAB SAB Ect Mult Living   7 5   2 1  1  5       Review of Systems  Constitutional: Positive for chills and appetite change. Negative for fever.  HENT: Negative for congestion, sore throat and rhinorrhea.   Respiratory: Negative for cough.   Gastrointestinal: Positive for nausea, vomiting and abdominal pain. Negative for diarrhea, constipation and abdominal distention.  Genitourinary: Negative for dysuria, frequency, hematuria, flank pain, vaginal bleeding, vaginal discharge and pelvic pain.  All other systems reviewed and are  negative.    Allergies  Review of patient's allergies indicates no known allergies.  Home Medications   Current Outpatient Rx  Name  Route  Sig  Dispense  Refill  . EXPIRED: flavoxATE (URISPAS) 100 MG tablet   Oral   Take 100 mg by mouth 3 (three) times daily as needed. Has not picked up           BP 113/49  Pulse 100  Temp(Src) 98.7 F (37.1 C) (Oral)  Resp 16  SpO2 98%  LMP 11/06/2012  Physical Exam  Nursing note and vitals reviewed. Constitutional: She is oriented to person, place, and time. She appears well-developed and well-nourished. No distress.  HENT:  Head: Normocephalic.  Mouth/Throat: Oropharynx is clear and moist.  Neck: Normal range of motion. Neck supple.  No meningeal signs.  Cardiovascular: Normal rate and regular rhythm.   Pulmonary/Chest: Effort normal and breath sounds normal. No respiratory distress.  Abdominal: Soft. She exhibits no distension. There is tenderness. There is no rebound, no guarding, no CVA tenderness, no tenderness at McBurney's point and negative Murphy's sign.  Very mild diffuse tenderness.  Musculoskeletal: Normal range of motion.  Neurological: She is alert and oriented to person, place, and time.  Skin: Skin is warm and dry. No rash noted.  Psychiatric: She has a normal mood and affect. Her behavior is  normal.    ED Course  Procedures   Results for orders placed during the hospital encounter of 12/10/12  CBC WITH DIFFERENTIAL      Result Value Range   WBC 6.2  4.0 - 10.5 K/uL   RBC 4.29  3.87 - 5.11 MIL/uL   Hemoglobin 12.3  12.0 - 15.0 g/dL   HCT 40.9  81.1 - 91.4 %   MCV 85.1  78.0 - 100.0 fL   MCH 28.7  26.0 - 34.0 pg   MCHC 33.7  30.0 - 36.0 g/dL   RDW 78.2  95.6 - 21.3 %   Platelets 276  150 - 400 K/uL   Neutrophils Relative 76  43 - 77 %   Neutro Abs 4.7  1.7 - 7.7 K/uL   Lymphocytes Relative 18  12 - 46 %   Lymphs Abs 1.1  0.7 - 4.0 K/uL   Monocytes Relative 6  3 - 12 %   Monocytes Absolute 0.4  0.1 -  1.0 K/uL   Eosinophils Relative 1  0 - 5 %   Eosinophils Absolute 0.1  0.0 - 0.7 K/uL   Basophils Relative 0  0 - 1 %   Basophils Absolute 0.0  0.0 - 0.1 K/uL  COMPREHENSIVE METABOLIC PANEL      Result Value Range   Sodium 136  135 - 145 mEq/L   Potassium 3.6  3.5 - 5.1 mEq/L   Chloride 103  96 - 112 mEq/L   CO2 24  19 - 32 mEq/L   Glucose, Bld 104 (*) 70 - 99 mg/dL   BUN 19  6 - 23 mg/dL   Creatinine, Ser 0.86  0.50 - 1.10 mg/dL   Calcium 8.9  8.4 - 57.8 mg/dL   Total Protein 7.6  6.0 - 8.3 g/dL   Albumin 3.5  3.5 - 5.2 g/dL   AST 15  0 - 37 U/L   ALT 10  0 - 35 U/L   Alkaline Phosphatase 92  39 - 117 U/L   Total Bilirubin 0.4  0.3 - 1.2 mg/dL   GFR calc non Af Amer >90  >90 mL/min   GFR calc Af Amer >90  >90 mL/min  LIPASE, BLOOD      Result Value Range   Lipase 22  11 - 59 U/L  URINALYSIS, MICROSCOPIC ONLY      Result Value Range   Color, Urine YELLOW  YELLOW   APPearance CLEAR  CLEAR   Specific Gravity, Urine 1.031 (*) 1.005 - 1.030   pH 6.0  5.0 - 8.0   Glucose, UA NEGATIVE  NEGATIVE mg/dL   Hgb urine dipstick MODERATE (*) NEGATIVE   Bilirubin Urine NEGATIVE  NEGATIVE   Ketones, ur NEGATIVE  NEGATIVE mg/dL   Protein, ur NEGATIVE  NEGATIVE mg/dL   Urobilinogen, UA 1.0  0.0 - 1.0 mg/dL   Nitrite NEGATIVE  NEGATIVE   Leukocytes, UA NEGATIVE  NEGATIVE   WBC, UA 0-2  <3 WBC/hpf   RBC / HPF 11-20  <3 RBC/hpf   Bacteria, UA FEW (*) RARE   Squamous Epithelial / LPF FEW (*) RARE   Urine-Other MUCOUS PRESENT    PREGNANCY, URINE      Result Value Range   Preg Test, Ur NEGATIVE  NEGATIVE        1. Nausea & vomiting   2. Viral syndrome       MDM  Pt seen and evaluated.  Pt in no acute distress.  Pt well appearing  and does not appear toxic.   Pt feeling better after fluids and treatment. She is tolerating PO fluids. Pt with children with N/V/D.  Symptoms most likely caused from virus.  Will d/c with symptomatic tx.     Angus Seller, PA-C 12/10/12 0800

## 2012-12-10 NOTE — ED Provider Notes (Signed)
Medical screening examination/treatment/procedure(s) were performed by non-physician practitioner and as supervising physician I was immediately available for consultation/collaboration.   Dione Booze, MD 12/10/12 [phone]

## 2012-12-10 NOTE — ED Notes (Signed)
Pt c/o abdominal pain, n/v since yesterday.  Denies diarrhea, urinary sx.  Alert and oriented, ambulatory

## 2013-12-07 ENCOUNTER — Encounter (HOSPITAL_COMMUNITY): Payer: Self-pay | Admitting: *Deleted

## 2013-12-07 ENCOUNTER — Inpatient Hospital Stay (HOSPITAL_COMMUNITY)
Admission: AD | Admit: 2013-12-07 | Discharge: 2013-12-07 | Disposition: A | Discharge status: Home / Self Care | Payer: Self-pay | Source: Ambulatory Visit | Attending: Family Medicine | Admitting: Family Medicine

## 2013-12-07 DIAGNOSIS — N898 Other specified noninflammatory disorders of vagina: Secondary | ICD-10-CM

## 2013-12-07 DIAGNOSIS — A499 Bacterial infection, unspecified: Secondary | ICD-10-CM | POA: Insufficient documentation

## 2013-12-07 DIAGNOSIS — B9689 Other specified bacterial agents as the cause of diseases classified elsewhere: Secondary | ICD-10-CM | POA: Insufficient documentation

## 2013-12-07 DIAGNOSIS — R109 Unspecified abdominal pain: Secondary | ICD-10-CM | POA: Insufficient documentation

## 2013-12-07 DIAGNOSIS — N760 Acute vaginitis: Secondary | ICD-10-CM | POA: Insufficient documentation

## 2013-12-07 LAB — URINALYSIS, ROUTINE W REFLEX MICROSCOPIC
Bilirubin Urine: NEGATIVE
Glucose, UA: NEGATIVE mg/dL
Ketones, ur: NEGATIVE mg/dL
LEUKOCYTES UA: NEGATIVE
NITRITE: NEGATIVE
Protein, ur: NEGATIVE mg/dL
SPECIFIC GRAVITY, URINE: 1.02 (ref 1.005–1.030)
UROBILINOGEN UA: 0.2 mg/dL (ref 0.0–1.0)
pH: 6 (ref 5.0–8.0)

## 2013-12-07 LAB — URINE MICROSCOPIC-ADD ON

## 2013-12-07 LAB — WET PREP, GENITAL
TRICH WET PREP: NONE SEEN
YEAST WET PREP: NONE SEEN

## 2013-12-07 LAB — POCT PREGNANCY, URINE: Preg Test, Ur: NEGATIVE

## 2013-12-07 MED ORDER — METRONIDAZOLE 500 MG PO TABS: 500.0000 mg | ORAL_TABLET | Freq: Two times a day (BID) | ORAL | Status: DC

## 2013-12-07 NOTE — MAU Provider Note (Signed)
Attestation of Attending Supervision of Advanced Practitioner (PA/CNM/NP): Evaluation and management procedures were performed by the Advanced Practitioner under my supervision and collaboration.  I have reviewed the Advanced Practitioner's note and chart, and I agree with the management and plan.  Kirtan Sada S, MD [facility] Attending 12/07/2013 11:37 PM   

## 2013-12-07 NOTE — MAU Provider Note (Signed)
History     CSN: [id number]  Arrival date and time: 12/07/13 4098   First Provider Initiated Contact with Patient 12/07/13 1954      Chief Complaint  Patient presents with  . Abdominal Pain  . Vaginal Discharge   Abdominal Pain  Vaginal Discharge The patient's primary symptoms include a vaginal discharge. Associated symptoms include abdominal pain.    Theresa Mooney is a 33 y.o. [id number] who presents today with vaginal odor and discharge. She also has not had a period since the end of January. She states that she has had both tubes removed after having an ectopic pregnancy after a BTL.   Past Medical History  Diagnosis Date  . Herpes 2002  . Trichimoniasis 12-2010  . Infection     Past Surgical History  Procedure Laterality Date  . Tubal ligation  12-05-2010  . Laparoscopy  11/27/2011    Procedure: LAPAROSCOPY OPERATIVE;  Surgeon: Catalina Antigua, MD;  Location: [facility];  Service: Gynecology;  Laterality: Bilateral;  . Dilation and curettage of uterus    . Salpingectomy      Bilateral for twin ectopic preg.     Family History  Problem Relation Age of Onset  . Anesthesia problems Neg Hx     History  Substance Use Topics  . Smoking status: Never Smoker   . Smokeless tobacco: Never Used  . Alcohol Use: No    Allergies: No Known Allergies  Prescriptions prior to admission  Medication Sig Dispense Refill  . promethazine (PHENERGAN) 25 MG tablet Take 1 tablet (25 mg total) by mouth every 6 (six) hours as needed for nausea.  20 tablet  0    Review of Systems  Gastrointestinal: Positive for abdominal pain.  Genitourinary: Positive for vaginal discharge.   Physical Exam   Blood pressure 114/67, pulse 71, temperature 98 F (36.7 C), resp. rate 20, height 4\' 11"  (1.499 m), weight 96.798 kg (213 lb 6.4 oz), last menstrual period 10/22/2013.  Physical Exam  Nursing note and vitals reviewed. Constitutional: She is oriented to person, place, and time. She appears  well-developed and well-nourished. No distress.  Cardiovascular: Normal rate.   Respiratory: Effort normal.  GI: Soft. She exhibits no distension.  Genitourinary:   External: no lesion Vagina: small amount of white discharge Cervix: pink, smooth, no CMT Uterus: NSSC Adnexa: NT   Neurological: She is alert and oriented to person, place, and time.  Skin: Skin is warm and dry.  Psychiatric: She has a normal mood and affect.    MAU Course  Procedures  Results for orders placed during the hospital encounter of 12/07/13 (from the past 24 hour(s))  WET PREP, GENITAL     Status: Abnormal   Collection Time    12/07/13  8:02 PM      Result Value Ref Range   Yeast Wet Prep HPF POC NONE SEEN  NONE SEEN   Trich, Wet Prep NONE SEEN  NONE SEEN   Clue Cells Wet Prep HPF POC FEW (*) NONE SEEN   WBC, Wet Prep HPF POC FEW (*) NONE SEEN  POCT PREGNANCY, URINE     Status: None   Collection Time    12/07/13  8:54 PM      Result Value Ref Range   Preg Test, Ur NEGATIVE  NEGATIVE     Assessment and Plan   1. Vaginal discharge   2. BV (bacterial vaginosis)      Medication List  metroNIDAZOLE 500 MG tablet  Commonly known as:  FLAGYL  Take 1 tablet (500 mg total) by mouth 2 (two) times daily.       Follow-up Information   [facility] (As needed)    Specialty:  Obstetrics and Gynecology   Contact information:   [address] [place] [address] [phone]       Tawnya CrookHogan, Marshella Tello Donovan 12/07/2013, 8:01 PM

## 2013-12-07 NOTE — Progress Notes (Signed)
Pain radiates to sides and back

## 2013-12-07 NOTE — MAU Note (Signed)
I missed my period in Feb, vag d/c with odor (white/brown color) and abdominal pain off and on since last month. Negative preg test at home

## 2013-12-07 NOTE — Discharge Instructions (Signed)

## 2013-12-08 LAB — GC/CHLAMYDIA PROBE AMP
CT Probe RNA: NEGATIVE
GC Probe RNA: NEGATIVE

## 2014-01-18 ENCOUNTER — Encounter (HOSPITAL_COMMUNITY): Payer: Self-pay | Admitting: Emergency Medicine

## 2014-01-18 ENCOUNTER — Emergency Department (HOSPITAL_COMMUNITY)
Admission: EM | Admit: 2014-01-18 | Discharge: 2014-01-18 | Disposition: A | Discharge status: Home / Self Care | Payer: Self-pay | Attending: Emergency Medicine | Admitting: Emergency Medicine

## 2014-01-18 DIAGNOSIS — R1013 Epigastric pain: Secondary | ICD-10-CM | POA: Insufficient documentation

## 2014-01-18 DIAGNOSIS — R197 Diarrhea, unspecified: Secondary | ICD-10-CM | POA: Insufficient documentation

## 2014-01-18 DIAGNOSIS — Z3202 Encounter for pregnancy test, result negative: Secondary | ICD-10-CM | POA: Insufficient documentation

## 2014-01-18 DIAGNOSIS — Z8619 Personal history of other infectious and parasitic diseases: Secondary | ICD-10-CM | POA: Insufficient documentation

## 2014-01-18 DIAGNOSIS — R112 Nausea with vomiting, unspecified: Secondary | ICD-10-CM | POA: Insufficient documentation

## 2014-01-18 LAB — URINALYSIS, ROUTINE W REFLEX MICROSCOPIC
GLUCOSE, UA: NEGATIVE mg/dL
KETONES UR: NEGATIVE mg/dL
LEUKOCYTES UA: NEGATIVE
NITRITE: NEGATIVE
PH: 5.5 (ref 5.0–8.0)
Protein, ur: NEGATIVE mg/dL
SPECIFIC GRAVITY, URINE: 1.031 — AB (ref 1.005–1.030)
Urobilinogen, UA: 1 mg/dL (ref 0.0–1.0)

## 2014-01-18 LAB — COMPREHENSIVE METABOLIC PANEL
ALBUMIN: 3.8 g/dL (ref 3.5–5.2)
ALT: 11 U/L (ref 0–35)
AST: 15 U/L (ref 0–37)
Alkaline Phosphatase: 93 U/L (ref 39–117)
BUN: 10 mg/dL (ref 6–23)
CALCIUM: 9 mg/dL (ref 8.4–10.5)
CHLORIDE: 102 meq/L (ref 96–112)
CO2: 26 meq/L (ref 19–32)
Creatinine, Ser: 0.87 mg/dL (ref 0.50–1.10)
GFR calc Af Amer: >90 mL/min (ref >90)
GFR, EST NON AFRICAN AMERICAN: 89 mL/min — AB (ref >90)
Glucose, Bld: 103 mg/dL — ABNORMAL HIGH (ref 70–99)
Potassium: 3.2 mEq/L — ABNORMAL LOW (ref 3.7–5.3)
SODIUM: 140 meq/L (ref 137–147)
Total Bilirubin: 0.2 mg/dL — ABNORMAL LOW (ref 0.3–1.2)
Total Protein: 7.8 g/dL (ref 6.0–8.3)

## 2014-01-18 LAB — URINE MICROSCOPIC-ADD ON

## 2014-01-18 LAB — CBC WITH DIFFERENTIAL/PLATELET
BASOS ABS: 0 10*3/uL (ref 0.0–0.1)
BASOS PCT: 0 % (ref 0–1)
Eosinophils Absolute: 0.2 10*3/uL (ref 0.0–0.7)
Eosinophils Relative: 4 % (ref 0–5)
HCT: 35.9 % — ABNORMAL LOW (ref 36.0–46.0)
Hemoglobin: 11.5 g/dL — ABNORMAL LOW (ref 12.0–15.0)
LYMPHS PCT: 51 % — AB (ref 12–46)
Lymphs Abs: 3 10*3/uL (ref 0.7–4.0)
MCH: 27.5 pg (ref 26.0–34.0)
MCHC: 32 g/dL (ref 30.0–36.0)
MCV: 85.9 fL (ref 78.0–100.0)
Monocytes Absolute: 0.4 10*3/uL (ref 0.1–1.0)
Monocytes Relative: 7 % (ref 3–12)
NEUTROS ABS: 2.3 10*3/uL (ref 1.7–7.7)
Neutrophils Relative %: 38 % — ABNORMAL LOW (ref 43–77)
PLATELETS: 309 10*3/uL (ref 150–400)
RBC: 4.18 MIL/uL (ref 3.87–5.11)
RDW: 14.1 % (ref 11.5–15.5)
WBC: 6 10*3/uL (ref 4.0–10.5)

## 2014-01-18 LAB — PREGNANCY, URINE: PREG TEST UR: NEGATIVE

## 2014-01-18 LAB — LIPASE, BLOOD: Lipase: 21 U/L (ref 11–59)

## 2014-01-18 MED ORDER — ONDANSETRON HCL 4 MG PO TABS: 4.0000 mg | ORAL_TABLET | Freq: Four times a day (QID) | ORAL | Status: DC

## 2014-01-18 MED ORDER — ONDANSETRON HCL 4 MG/2ML IJ SOLN
4.0000 mg | Freq: Once | INTRAMUSCULAR | Status: AC
  Administered 2014-01-18: 4 mg via INTRAVENOUS
  Filled 2014-01-18: qty 2

## 2014-01-18 MED ORDER — SODIUM CHLORIDE 0.9 % IV BOLUS (SEPSIS)
1000.0000 mL | Freq: Once | INTRAVENOUS | Status: AC
  Administered 2014-01-18: 1000 mL via INTRAVENOUS

## 2014-01-18 NOTE — ED Notes (Signed)
The  Pt is c/o nv and d  For 2 days.  lmp last month

## 2014-01-18 NOTE — ED Provider Notes (Signed)
CSN: [id number]     Arrival date & time 01/18/14  1506 History   First MD Initiated Contact with Patient 01/18/14 1755     Chief Complaint  Patient presents with  . Emesis     (Consider location/radiation/quality/duration/timing/severity/associated sxs/prior Treatment) HPI Comments: Patient with past salpingectomy, no other abdominal surgery history -- presents with complaint of nausea, vomiting, diarrhea, and upper abdominal pain that began 24 hours ago. Abdominal pain that started after the vomiting and diarrhea. Stool is been nonbloody. She has had 5-6 episodes of loose stools in the past 24 hours. Patient states that she presents today but this made her stomach pain worse. 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 Patient denies fever. No treatments prior to arrival. The onset of this condition was acute. The course is constant. Aggravating factors: none. Alleviating factors: none.    Patient is a 33 y.o. female presenting with vomiting. The history is provided by the patient and medical records.  Emesis Associated symptoms: abdominal pain and diarrhea   Associated symptoms: no headaches, no myalgias and no sore throat     Past Medical History  Diagnosis Date  . Herpes 2002  . Trichimoniasis 12-2010  . Infection    Past Surgical History  Procedure Laterality Date  . Tubal ligation  12-05-2010  . Laparoscopy  11/27/2011    Procedure: LAPAROSCOPY OPERATIVE;  Surgeon: Catalina AntiguaPeggy Constant, MD;  Location: [facility];  Service: Gynecology;  Laterality: Bilateral;  . Dilation and curettage of uterus    . Salpingectomy      Bilateral for twin ectopic preg.    Family History  Problem Relation Age of Onset  . Anesthesia problems Neg Hx    History  Substance Use Topics  . Smoking status: Never Smoker   . Smokeless tobacco: Never Used  . Alcohol Use: No   OB History   Grav Para Term Preterm Abortions TAB SAB Ect Mult Living   7 5   2 1  1  5      Review of Systems   Constitutional: Negative for fever.  HENT: Negative for rhinorrhea and sore throat.   Eyes: Negative for redness.  Respiratory: Negative for cough.   Cardiovascular: Negative for chest pain.  Gastrointestinal: Positive for nausea, vomiting, abdominal pain and diarrhea. Negative for blood in stool.  Genitourinary: Negative for dysuria.  Musculoskeletal: Negative for myalgias.  Skin: Negative for rash.  Neurological: Negative for headaches.   Allergies  Review of patient's allergies indicates no known allergies.  Home Medications   Prior to Admission medications   Not on File   BP 129/64  Pulse 73  Temp(Src) 98 F (36.7 C) (Oral)  Resp 18  Ht 5' (1.524 m)  Wt 214 lb (97.07 kg)  BMI 41.79 kg/m2  SpO2 100%  LMP 12/18/2013  Physical Exam  Nursing note and vitals reviewed. Constitutional: She appears well-developed and well-nourished.  HENT:  Head: Normocephalic and atraumatic.  Eyes: Conjunctivae are normal. Right eye exhibits no discharge. Left eye exhibits no discharge.  Neck: Normal range of motion. Neck supple.  Cardiovascular: Normal rate, regular rhythm and normal heart sounds.   Pulmonary/Chest: Effort normal and breath sounds normal.  Abdominal: Soft. Bowel sounds are decreased. There is tenderness (mild) in the epigastric area. There is no rigidity, no rebound, no guarding, no CVA tenderness, no tenderness at McBurney's point and negative Murphy's sign.  Neurological: She is alert.  Skin: Skin is warm and dry.  Psychiatric: She has a  normal mood and affect.    ED Course  Procedures (including critical care time) Labs Review Labs Reviewed  URINALYSIS, ROUTINE W REFLEX MICROSCOPIC - Abnormal; Notable for the following:    Color, Urine AMBER (*)    APPearance CLOUDY (*)    Specific Gravity, Urine 1.031 (*)    Hgb urine dipstick LARGE (*)    Bilirubin Urine SMALL (*)    All other components within normal limits  URINE MICROSCOPIC-ADD ON - Abnormal; Notable  for the following:    Squamous Epithelial / LPF MANY (*)    Bacteria, UA MANY (*)    All other components within normal limits  PREGNANCY, URINE  CBC WITH DIFFERENTIAL  COMPREHENSIVE METABOLIC PANEL  LIPASE, BLOOD    Imaging Review No results found.   EKG Interpretation None      6:18 PM Patient seen and examined. Work-up initiated. Medications ordered.   Vital signs reviewed and are as follows: Filed Vitals:   01/18/14 1509  BP: 129/64  Pulse: 73  Temp: 98 F (36.7 C)  Resp: 18   7:50 PM Patient feeling better. She has been drinking ginger ale in room and not vomiting.   Pt counseled on brat diet and s/s to return.   The patient was urged to return to the Emergency Department immediately with worsening of current symptoms, worsening abdominal pain, persistent vomiting, blood noted in stools, fever, or any other concerns. The patient verbalized understanding.   MDM   Final diagnoses:  Nausea vomiting and diarrhea   Patient with symptoms consistent with viral gastroenteritis. 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 Vitals are stable, no fever. No signs of dehydration, tolerating PO's. Lungs are clear. No focal abdominal pain, no concern for appendicitis, cholecystitis, pancreatitis, ruptured viscus, UTI, kidney stone, or any other abdominal etiology. Supportive therapy indicated with return if symptoms worsen. Patient counseled.    Renne CriglerJoshua Averill Winters, PA-C 01/18/14 1952

## 2014-01-18 NOTE — Discharge Instructions (Signed)
Please read and follow all provided instructions.  Your diagnoses today include:  1. Nausea vomiting and diarrhea    Tests performed today include:  Blood counts and electrolytes  Blood tests to check liver and kidney function  Blood tests to check pancreas function  Urine test to look for infection and pregnancy (in women)  Vital signs. See below for your results today.    Medications prescribed:   Zofran (ondansetron) - for nausea and vomiting  Take any prescribed medications only as directed.  Home care instructions:   Follow any educational materials contained in this packet.   Your abdominal pain, nausea, vomiting, and diarrhea may be caused by a viral gastroenteritis also called 'stomach flu'. You should rest for the next several days. Keep drinking plenty of fluids and use the medicine for nausea as directed.    Drink clear liquids for the next 24 hours and introduce solid foods slowly after 24 hours using the b.r.a.t. diet (Bananas, Rice, Applesauce, Toast, Yogurt).    Follow-up instructions: Please follow-up with your primary care provider in the next 2 days for further evaluation of your symptoms. If you are not feeling better in 48 hours you may have a condition that is more serious and you need re-evaluation. If you do not have a primary care doctor -- see below for referral information.   Return instructions:  SEEK IMMEDIATE MEDICAL ATTENTION IF:  If you have pain that does not go away or becomes severe   A temperature above 101F develops   Repeated vomiting occurs (multiple episodes)   If you have pain that becomes localized to portions of the abdomen. The right side could possibly be appendicitis. In an adult, the left lower portion of the abdomen could be colitis or diverticulitis.   Blood is being passed in stools or vomit (bright red or black tarry stools)   You develop chest pain, difficulty breathing, dizziness or fainting, or become confused, poorly  responsive, or inconsolable (young children)  If you have any other emergent concerns regarding your health  Additional Information: Abdominal (belly) pain can be caused by many things. Your caregiver performed an examination and possibly ordered blood/urine tests and imaging (CT scan, x-rays, ultrasound). Many cases can be observed and treated at home after initial evaluation in the emergency department. Even though you are being discharged home, abdominal pain can be unpredictable. Therefore, you need a repeated exam if your pain does not resolve, returns, or worsens. Most patients with abdominal pain don't have to be admitted to the hospital or have surgery, but serious problems like appendicitis and gallbladder attacks can start out as nonspecific pain. Many abdominal conditions cannot be diagnosed in one visit, so follow-up evaluations are very important.  Your vital signs today were: BP 102/58   Pulse 78   Temp(Src) 98 F (36.7 C) (Oral)   Resp 18   Ht 5' (1.524 m)   Wt 214 lb (97.07 kg)   BMI 41.79 kg/m2   SpO2 100%   LMP 12/18/2013 If your blood pressure (bp) was elevated above 135/85 this visit, please have this repeated by your doctor within one month. --------------

## 2014-01-21 NOTE — ED Provider Notes (Signed)
Medical screening examination/treatment/procedure(s) were performed by non-physician practitioner and as supervising physician I was immediately available for consultation/collaboration.   EKG Interpretation None        Theresa CamelScott T Hriday Stai, MD 01/21/14 [phone]

## 2014-07-22 ENCOUNTER — Emergency Department (HOSPITAL_COMMUNITY)
Admission: EM | Admit: 2014-07-22 | Discharge: 2014-07-22 | Disposition: A | Discharge status: Home / Self Care | Payer: Self-pay | Attending: Emergency Medicine | Admitting: Emergency Medicine

## 2014-07-22 ENCOUNTER — Encounter (HOSPITAL_COMMUNITY): Payer: Self-pay | Admitting: Emergency Medicine

## 2014-07-22 DIAGNOSIS — D649 Anemia, unspecified: Secondary | ICD-10-CM

## 2014-07-22 DIAGNOSIS — A039 Shigellosis, unspecified: Secondary | ICD-10-CM | POA: Insufficient documentation

## 2014-07-22 DIAGNOSIS — D500 Iron deficiency anemia secondary to blood loss (chronic): Secondary | ICD-10-CM | POA: Insufficient documentation

## 2014-07-22 DIAGNOSIS — Z3202 Encounter for pregnancy test, result negative: Secondary | ICD-10-CM | POA: Insufficient documentation

## 2014-07-22 LAB — COMPREHENSIVE METABOLIC PANEL WITH GFR
ALT: 10 U/L (ref 0–35)
AST: 15 U/L (ref 0–37)
Albumin: 3.7 g/dL (ref 3.5–5.2)
Alkaline Phosphatase: 101 U/L (ref 39–117)
Anion gap: 12 (ref 5–15)
BUN: 13 mg/dL (ref 6–23)
CO2: 28 meq/L (ref 19–32)
Calcium: 9.4 mg/dL (ref 8.4–10.5)
Chloride: 100 meq/L (ref 96–112)
Creatinine, Ser: 0.85 mg/dL (ref 0.50–1.10)
GFR calc Af Amer: >90 mL/min (ref >90)
GFR calc non Af Amer: >90 mL/min (ref >90)
Glucose, Bld: 89 mg/dL (ref 70–99)
Potassium: 3.8 meq/L (ref 3.7–5.3)
Sodium: 140 meq/L (ref 137–147)
Total Bilirubin: 0.2 mg/dL — ABNORMAL LOW (ref 0.3–1.2)
Total Protein: 8 g/dL (ref 6.0–8.3)

## 2014-07-22 LAB — URINALYSIS, ROUTINE W REFLEX MICROSCOPIC
Bilirubin Urine: NEGATIVE
Glucose, UA: NEGATIVE mg/dL
Ketones, ur: NEGATIVE mg/dL
Nitrite: NEGATIVE
Protein, ur: NEGATIVE mg/dL
Specific Gravity, Urine: 1.029 (ref 1.005–1.030)
Urobilinogen, UA: 0.2 mg/dL (ref 0.0–1.0)
pH: 5 (ref 5.0–8.0)

## 2014-07-22 LAB — CBC WITH DIFFERENTIAL/PLATELET
Basophils Absolute: 0 K/uL (ref 0.0–0.1)
Basophils Relative: 0 % (ref 0–1)
Eosinophils Absolute: 0 K/uL (ref 0.0–0.7)
Eosinophils Relative: 1 % (ref 0–5)
HCT: 35.2 % — ABNORMAL LOW (ref 36.0–46.0)
Hemoglobin: 11 g/dL — ABNORMAL LOW (ref 12.0–15.0)
Lymphocytes Relative: 23 % (ref 12–46)
Lymphs Abs: 1.5 K/uL (ref 0.7–4.0)
MCH: 26.1 pg (ref 26.0–34.0)
MCHC: 31.3 g/dL (ref 30.0–36.0)
MCV: 83.6 fL (ref 78.0–100.0)
Monocytes Absolute: 0.9 K/uL (ref 0.1–1.0)
Monocytes Relative: 14 % — ABNORMAL HIGH (ref 3–12)
Neutro Abs: 4 K/uL (ref 1.7–7.7)
Neutrophils Relative %: 62 % (ref 43–77)
Platelets: 260 K/uL (ref 150–400)
RBC: 4.21 MIL/uL (ref 3.87–5.11)
RDW: 14.9 % (ref 11.5–15.5)
WBC: 6.5 K/uL (ref 4.0–10.5)

## 2014-07-22 LAB — POC URINE PREG, ED: PREG TEST UR: NEGATIVE

## 2014-07-22 LAB — URINE MICROSCOPIC-ADD ON

## 2014-07-22 NOTE — ED Notes (Signed)
Pt's son recently tx for salmonella.  Pt c/o abdominal cramping and diarrhea.  VS stable.

## 2014-07-22 NOTE — Discharge Instructions (Signed)
He may take Kaopectate or Pepto-Bismol for your diarrhea. Do not take Imodium. If you start running a fever or start noticing blood in the stool, return so that antibiotics can be started. If one of your children is put on antibiotics for this infection, and everybody in the household needs to be treated at the same time.  Shigella Infection, Adult Shigella infection occurs when certain bacteria infect the intestines. Symptoms usually start between 2 days and 4 days after ingestion of the bacteria, but they may begin as late as 1 week after ingestion. The illness usually lasts from 5 days to 7 days. Shigella infection can spread from person to person (contagious). Most people recover completely. In rare cases, lasting problems may develop, such as arthritis, kidney problems, or abnormal blood counts. CAUSES  The bacteria that cause shigella infection are found in the stool of infected people. You can become infected by:  Eating food or drinking liquids that are contaminated with the bacteria.  Touching surfaces or objects contaminated with the bacteria and then placing your hand in your mouth.  Having direct contact with a person who is infected. This may occur while caring for someone with illness or while sharing foods or eating utensils with someone who is ill.  Swimming in contaminated water. SYMPTOMS   Diarrhea, commonly with blood, mucus, or pus.  Abdominal pain or cramps.  Fever.  Nausea.  Vomiting.  Loss of appetite.  Rectal spasms (tenesmus).  Rectum protruding out of the body (rectal prolapse). DIAGNOSIS  Your caregiver will take your history and perform a physical exam. A stool sample may also be taken and tested for the presence of Shigella bacteria. TREATMENT  Often, no treatment is needed. However, you will need to drink plenty of fluids to prevent dehydration. Preventing and treating dehydration is important because severe dehydration can cause serious problems. In  severe cases, antibiotic medicines may be given to help shorten the illness and to prevent others from being infected. Antidiarrheal medicines are not recommended. They can make your condition worse. HOME CARE INSTRUCTIONS  Wash your hands well to avoid spreading the bacteria.  Do not prepare food if you have diarrhea.  If you are given antibiotics, take them as directed. Finish them even if you start to feel better.  Only take over-the-counter or prescription medicines for pain, discomfort, or fever as directed by your caregiver.  Drink enough fluids to keep your urine clear or pale yellow. Until your diarrhea, nausea, or vomiting is under control, you should only drink clear liquids. Clear liquids are anything you can see through, such as water, broth, or non-caffeinated tea. Avoid:  Milk.  Fruit juice.  Alcohol.  Extremely hot or cold fluids.  If you do not have an appetite, do not force yourself to eat. However, you must continue to drink fluids.  If you have an appetite, eat a normal diet unless your caregiver tells you differently.  Eat a variety of complex carbohydrates (rice, wheat, potatoes, bread), lean meats, yogurt, fruits, and vegetables.  Avoid high-fat foods because they are more difficult to digest.  If you are dehydrated, ask your caregiver for specific rehydration instructions. Signs of dehydration may include:  Severe thirst.  Dry lips and mouth.  Dizziness.  Dark urine.  Decreasing urine frequency and amount.  Confusion.  Rapid breathing or pulse.  Keep all follow-up appointments as directed by your caregiver. SEEK IMMEDIATE MEDICAL CARE IF:   You are unable to keep fluids down.  You have  persistent vomiting or diarrhea.  You have abdominal pain that increases or is concentrated in one small area (localized).  Your diarrhea contains increased blood or mucus.  You feel very weak, dizzy, thirsty, or you faint.  You lose a significant amount  of weight. Your caregiver can tell you how much weight loss should concern you.  You have a fever.  You feel confused. MAKE SURE YOU:   Understand these instructions.  Will watch your condition.  Will get help right away if you are not doing well or get worse. Document Released: 09/10/2000 Document Revised: 01/28/2014 Document Reviewed: 11/11/2011 [facility] Patient Information 2015 [employer] [place]. This information is not intended to replace advice given to you by your health care provider. Make sure you discuss any questions you have with your health care provider.

## 2014-07-22 NOTE — ED Provider Notes (Addendum)
CSN: [id number]     Arrival date & time 07/22/14  1351 History   First MD Initiated Contact with Patient 07/22/14 1507     Chief Complaint  Patient presents with  . Abdominal Pain    diarrhea     (Consider location/radiation/quality/duration/timing/severity/associated sxs/prior Treatment) Patient is a 33 y.o. female presenting with abdominal pain. The history is provided by the patient.  Abdominal Pain She has been sick for the last 3 days. She started with fever 202 associated with chills. There was abdominal cramping and diarrhea. Fever and chills have resolved but she continues to have diarrhea. She is having 5-6 watery bowel movements a day. There is no blood or mucus. Abdominal cramping has persisted and she rates pain at 7/10. There is no nausea or vomiting. There was a sick contact in that she has a son who was diagnosed with Shigella. She has another child who has similar symptoms and has been advised to obtain stool cultures. The children have not been treated with antibiotics. Patient treated herself with Alka-Seltzer with no relief.  Past Medical History  Diagnosis Date  . Herpes 2002  . Trichimoniasis 12-2010  . Infection    Past Surgical History  Procedure Laterality Date  . Tubal ligation  12-05-2010  . Laparoscopy  11/27/2011    Procedure: LAPAROSCOPY OPERATIVE;  Surgeon: Catalina AntiguaPeggy Constant, MD;  Location: [facility];  Service: Gynecology;  Laterality: Bilateral;  . Dilation and curettage of uterus    . Salpingectomy      Bilateral for twin ectopic preg.    Family History  Problem Relation Age of Onset  . Anesthesia problems Neg Hx    History  Substance Use Topics  . Smoking status: Never Smoker   . Smokeless tobacco: Never Used  . Alcohol Use: No   OB History   Grav Para Term Preterm Abortions TAB SAB Ect Mult Living   7 5   2 1  1  5      Review of Systems  Gastrointestinal: Positive for abdominal pain.  All other systems reviewed and are  negative.     Allergies  Review of patient's allergies indicates no known allergies.  Home Medications   Prior to Admission medications   Medication Sig Start Date End Date Taking? Authorizing Provider  ondansetron (ZOFRAN) 4 MG tablet Take 1 tablet (4 mg total) by mouth every 6 (six) hours. 01/18/14   Renne CriglerJoshua Geiple, PA-C   BP 111/69  Pulse 85  Temp(Src) 98.1 F (36.7 C) (Oral)  Resp 18  Ht 4\' 11"  (1.499 m)  Wt 200 lb (90.719 kg)  BMI 40.37 kg/m2  SpO2 100%  LMP 06/22/2014 Physical Exam  Nursing note and vitals reviewed.  32 year oldfe female, resting comfortably and in no acute distress. Vital signs are normal. Oxygen saturation is 100%, which is normal. Head is normocephalic and atraumatic. PERRLA, EOMI. Oropharynx is clear. Neck is nontender and supple without adenopathy or JVD. Back is nontender and there is no CVA tenderness. Lungs are clear without rales, wheezes, or rhonchi. Chest is nontender. Heart has regular rate and rhythm without murmur. Abdomen is soft, flat, nontender without masses or hepatosplenomegaly and peristalsis is hypoactive. Extremities have no cyanosis or edema, full range of motion is present. Skin is warm and dry without rash. Neurologic: Mental status is normal, cranial nerves are intact, there are no motor or sensory deficits.  ED Course  Procedures (including critical care time) Labs Review Results for orders placed during the hospital encounter  of 07/22/14  CBC WITH DIFFERENTIAL      Result Value Ref Range   WBC 6.5  4.0 - 10.5 K/uL   RBC 4.21  3.87 - 5.11 MIL/uL   Hemoglobin 11.0 (*) 12.0 - 15.0 g/dL   HCT 45.435.2 (*) 09.836.0 - 11.946.0 %   MCV 83.6  78.0 - 100.0 fL   MCH 26.1  26.0 - 34.0 pg   MCHC 31.3  30.0 - 36.0 g/dL   RDW 14.714.9  82.911.5 - 56.215.5 %   Platelets 260  150 - 400 K/uL   Neutrophils Relative % 62  43 - 77 %   Neutro Abs 4.0  1.7 - 7.7 K/uL   Lymphocytes Relative 23  12 - 46 %   Lymphs Abs 1.5  0.7 - 4.0 K/uL   Monocytes Relative  14 (*) 3 - 12 %   Monocytes Absolute 0.9  0.1 - 1.0 K/uL   Eosinophils Relative 1  0 - 5 %   Eosinophils Absolute 0.0  0.0 - 0.7 K/uL   Basophils Relative 0  0 - 1 %   Basophils Absolute 0.0  0.0 - 0.1 K/uL  COMPREHENSIVE METABOLIC PANEL      Result Value Ref Range   Sodium 140  137 - 147 mEq/L   Potassium 3.8  3.7 - 5.3 mEq/L   Chloride 100  96 - 112 mEq/L   CO2 28  19 - 32 mEq/L   Glucose, Bld 89  70 - 99 mg/dL   BUN 13  6 - 23 mg/dL   Creatinine, Ser 1.300.85  0.50 - 1.10 mg/dL   Calcium 9.4  8.4 - 86.510.5 mg/dL   Total Protein 8.0  6.0 - 8.3 g/dL   Albumin 3.7  3.5 - 5.2 g/dL   AST 15  0 - 37 U/L   ALT 10  0 - 35 U/L   Alkaline Phosphatase 101  39 - 117 U/L   Total Bilirubin 0.2 (*) 0.3 - 1.2 mg/dL   GFR calc non Af Amer >90  >90 mL/min   GFR calc Af Amer >90  >90 mL/min   Anion gap 12  5 - 15  POC URINE PREG, ED      Result Value Ref Range   Preg Test, Ur NEGATIVE  NEGATIVE   MDM   Final diagnoses:  Shigella enteritis  Normochromic normocytic anemia    Diarrhea which apparently is due to Shigella. No evidence of bacterial dysentery. Although she had fever initially, she has no ongoing fever. There is no leukocytosis. No indication for antibiotic administration at this point. This was explained to the patient. However, should anyone in her household become ill enough to require antibiotic treatment, I have advised her that everybody in the family should be treated concurrently to avoid reinfection.    Dione Boozeavid Apolonia Ellwood, MD 07/22/14 1607  Dione Boozeavid Lexys Milliner, MD 08/07/14 [phone]

## 2014-07-22 NOTE — ED Notes (Signed)
Pt doesn't know what her son was diagnosed with but states it is contagious and in his poop.

## 2014-07-29 ENCOUNTER — Encounter (HOSPITAL_COMMUNITY): Payer: Self-pay | Admitting: Emergency Medicine

## 2014-08-01 ENCOUNTER — Other Ambulatory Visit (HOSPITAL_COMMUNITY)
Admission: RE | Admit: 2014-08-01 | Discharge: 2014-08-01 | Disposition: A | Discharge status: Home / Self Care | Payer: Self-pay | Source: Ambulatory Visit | Attending: Emergency Medicine | Admitting: Emergency Medicine

## 2014-08-01 ENCOUNTER — Encounter (HOSPITAL_COMMUNITY): Payer: Self-pay | Admitting: Emergency Medicine

## 2014-08-01 ENCOUNTER — Emergency Department (INDEPENDENT_AMBULATORY_CARE_PROVIDER_SITE_OTHER)
Admission: EM | Admit: 2014-08-01 | Discharge: 2014-08-01 | Disposition: A | Discharge status: Home / Self Care | Payer: Self-pay | Source: Home / Self Care | Attending: Family Medicine | Admitting: Family Medicine

## 2014-08-01 DIAGNOSIS — N760 Acute vaginitis: Secondary | ICD-10-CM | POA: Insufficient documentation

## 2014-08-01 DIAGNOSIS — Z113 Encounter for screening for infections with a predominantly sexual mode of transmission: Secondary | ICD-10-CM | POA: Insufficient documentation

## 2014-08-01 DIAGNOSIS — A499 Bacterial infection, unspecified: Secondary | ICD-10-CM

## 2014-08-01 DIAGNOSIS — B9689 Other specified bacterial agents as the cause of diseases classified elsewhere: Secondary | ICD-10-CM

## 2014-08-01 LAB — POCT URINALYSIS DIP (DEVICE)
BILIRUBIN URINE: NEGATIVE
GLUCOSE, UA: NEGATIVE mg/dL
Ketones, ur: NEGATIVE mg/dL
LEUKOCYTES UA: NEGATIVE
Nitrite: NEGATIVE
Protein, ur: NEGATIVE mg/dL
Specific Gravity, Urine: 1.015 (ref 1.005–1.030)
UROBILINOGEN UA: 0.2 mg/dL (ref 0.0–1.0)
pH: 7 (ref 5.0–8.0)

## 2014-08-01 LAB — POCT PREGNANCY, URINE: PREG TEST UR: NEGATIVE

## 2014-08-01 MED ORDER — CLINDAMYCIN HCL 300 MG PO CAPS: 300.0000 mg | ORAL_CAPSULE | Freq: Two times a day (BID) | ORAL | Status: DC

## 2014-08-01 NOTE — ED Notes (Signed)
Patient reports vaginal discharge, odor to discharge.  History of bv.  Current symptoms for 2 weeks

## 2014-08-01 NOTE — ED Provider Notes (Signed)
CSN: [id number]     Arrival date & time 08/01/14  1103 History   First MD Initiated Contact with Patient 08/01/14 1139     Chief Complaint  Patient presents with  . Vaginal Discharge   (Consider location/radiation/quality/duration/timing/severity/associated sxs/prior Treatment) HPI Comments: Malodorous vaginal discharge for 2-3 weeks. Denies abdominal /pelvic pain, fever, chills. LMP end of Oct 15.   Past Medical History  Diagnosis Date  . Herpes 2002  . Trichimoniasis 12-2010  . Infection    Past Surgical History  Procedure Laterality Date  . Tubal ligation  12-05-2010  . Laparoscopy  11/27/2011    Procedure: LAPAROSCOPY OPERATIVE;  Surgeon: Catalina AntiguaPeggy Constant, MD;  Location: [facility];  Service: Gynecology;  Laterality: Bilateral;  . Dilation and curettage of uterus    . Salpingectomy      Bilateral for twin ectopic preg.    Family History  Problem Relation Age of Onset  . Anesthesia problems Neg Hx    History  Substance Use Topics  . Smoking status: Never Smoker   . Smokeless tobacco: Never Used  . Alcohol Use: No   OB History    Gravida Para Term Preterm AB TAB SAB Ectopic Multiple Living   7 5   2 1  1  5      Review of Systems  Constitutional: Negative.   Respiratory: Negative for chest tightness and shortness of breath.   Cardiovascular: Negative.   Gastrointestinal: Negative.   Genitourinary: Positive for vaginal discharge. Negative for dysuria, urgency, frequency, flank pain, vaginal bleeding, menstrual problem and pelvic pain.  Musculoskeletal: Negative.   Neurological: Negative.     Allergies  Review of patient's allergies indicates no known allergies.  Home Medications   Prior to Admission medications   Medication Sig Start Date End Date Taking? Authorizing Provider  cetirizine (ZYRTEC) 10 MG tablet Take 10 mg by mouth daily as needed for allergies.    Historical Provider, MD  clindamycin (CLEOCIN) 300 MG capsule Take 1 capsule (300 mg total) by mouth 2  (two) times daily. 08/01/14   Hayden Rasmussenavid Raymondo Garcialopez, NP  Pseudoeph-Doxylamine-DM-APAP (NYQUIL PO) Take 1 capsule by mouth once as needed (for allergies/flu symptoms).    Historical Provider, MD   BP 103/64 mmHg  Pulse 76  Temp(Src) 98 F (36.7 C) (Oral)  Resp 16  SpO2 97%  LMP 07/22/2014 Physical Exam  Constitutional: She is oriented to person, place, and time. She appears well-developed and well-nourished. No distress.  Neck: Normal range of motion. Neck supple.  Cardiovascular: Normal rate.   Pulmonary/Chest: Effort normal. No respiratory distress.  Abdominal: Soft. She exhibits no distension. There is no tenderness.  Genitourinary:  NEFG Vaginal walls and cx coated with creamy white discharge, moderate amount. Cx midline; parous No CMT or adnexal tenderness  Musculoskeletal: Normal range of motion. She exhibits no edema.  Neurological: She is alert and oriented to person, place, and time. No cranial nerve deficit. She exhibits normal muscle tone.  Skin: Skin is warm and dry.  Psychiatric: She has a normal mood and affect.  Nursing note and vitals reviewed.   ED Course  Procedures (including critical care time) Labs Review Labs Reviewed  POCT URINALYSIS DIP (DEVICE) - Abnormal; Notable for the following:    Hgb urine dipstick MODERATE (*)    All other components within normal limits  POCT PREGNANCY, URINE  CERVICOVAGINAL ANCILLARY ONLY   Results for orders placed or performed during the hospital encounter of 08/01/14  POCT urinalysis dip (device)  Result Value Ref  Range   Glucose, UA NEGATIVE NEGATIVE mg/dL   Bilirubin Urine NEGATIVE NEGATIVE   Ketones, ur NEGATIVE NEGATIVE mg/dL   Specific Gravity, Urine 1.015 1.005 - 1.030   Hgb urine dipstick MODERATE (A) NEGATIVE   pH 7.0 5.0 - 8.0   Protein, ur NEGATIVE NEGATIVE mg/dL   Urobilinogen, UA 0.2 0.0 - 1.0 mg/dL   Nitrite NEGATIVE NEGATIVE   Leukocytes, UA NEGATIVE NEGATIVE  Pregnancy, urine POC  Result Value Ref Range    Preg Test, Ur NEGATIVE NEGATIVE    Imaging Review No results found.   MDM   1. BV (bacterial vaginosis)    Clindamycin 300 bid Cerv cytology pending       Hayden Rasmussenavid Atarah Cadogan, NP 08/01/14 1210

## 2014-08-01 NOTE — Discharge Instructions (Signed)
Bacterial Vaginosis °Bacterial vaginosis is a vaginal infection that occurs when the normal balance of bacteria in the vagina is disrupted. It results from an overgrowth of certain bacteria. This is the most common vaginal infection in women of childbearing age. Treatment is important to prevent complications, especially in pregnant women, as it can cause a premature delivery. °CAUSES  °Bacterial vaginosis is caused by an increase in harmful bacteria that are normally present in smaller amounts in the vagina. Several different kinds of bacteria can cause bacterial vaginosis. However, the reason that the condition develops is not fully understood. °RISK FACTORS °Certain activities or behaviors can put you at an increased risk of developing bacterial vaginosis, including: °· Having a new sex partner or multiple sex partners. °· Douching. °· Using an intrauterine device (IUD) for contraception. °Women do not get bacterial vaginosis from toilet seats, bedding, swimming pools, or contact with objects around them. °SIGNS AND SYMPTOMS  °Some women with bacterial vaginosis have no signs or symptoms. Common symptoms include: °· Grey vaginal discharge. °· A fishlike odor with discharge, especially after sexual intercourse. °· Itching or burning of the vagina and vulva. °· Burning or pain with urination. °DIAGNOSIS  °Your health care provider will take a medical history and examine the vagina for signs of bacterial vaginosis. A sample of vaginal fluid may be taken. Your health care provider will look at this sample under a microscope to check for bacteria and abnormal cells. A vaginal pH test may also be done.  °TREATMENT  °Bacterial vaginosis may be treated with antibiotic medicines. These may be given in the form of a pill or a vaginal cream. A second round of antibiotics may be prescribed if the condition comes back after treatment.  °HOME CARE INSTRUCTIONS  °· Only take over-the-counter or prescription medicines as  directed by your health care provider. °· If antibiotic medicine was prescribed, take it as directed. Make sure you finish it even if you start to feel better. °· Do not have sex until treatment is completed. °· Tell all sexual partners that you have a vaginal infection. They should see their health care provider and be treated if they have problems, such as a mild rash or itching. °· Practice safe sex by using condoms and only having one sex partner. °SEEK MEDICAL CARE IF:  °· Your symptoms are not improving after 3 days of treatment. °· You have increased discharge or pain. °· You have a fever. °MAKE SURE YOU:  °· Understand these instructions. °· Will watch your condition. °· Will get help right away if you are not doing well or get worse. °FOR MORE INFORMATION  °Centers for Disease Control and Prevention, Division of STD Prevention: www.cdc.gov/std °American Sexual Health Association (ASHA): www.ashastd.org  °Document Released: 09/13/2005 Document Revised: 07/04/2013 Document Reviewed: 04/25/2013 °ExitCare® Patient Information ©2015 [employer]. This information is not intended to replace advice given to you by your health care provider. Make sure you discuss any questions you have with your health care provider. ° °Vaginitis °Vaginitis is an inflammation of the vagina. It is most often caused by a change in the normal balance of the bacteria and yeast that live in the vagina. This change in balance causes an overgrowth of certain bacteria or yeast, which causes the inflammation. There are different types of vaginitis, but the most common types are: °· Bacterial vaginosis. °· Yeast infection (candidiasis). °· Trichomoniasis vaginitis. This is a sexually transmitted infection (STI). °· Viral vaginitis. °· Atropic vaginitis. °· Allergic vaginitis. °  CAUSES  °The cause depends on the type of vaginitis. Vaginitis can be caused by: °· Bacteria (bacterial vaginosis). °· Yeast (yeast infection). °· A parasite  (trichomoniasis vaginitis) °· A virus (viral vaginitis). °· Low hormone levels (atrophic vaginitis). Low hormone levels can occur during pregnancy, breastfeeding, or after menopause. °· Irritants, such as bubble baths, scented tampons, and feminine sprays (allergic vaginitis). °Other factors can change the normal balance of the yeast and bacteria that live in the vagina. These include: °· Antibiotic medicines. °· Poor hygiene. °· Diaphragms, vaginal sponges, spermicides, birth control pills, and intrauterine devices (IUD). °· Sexual intercourse. °· Infection. °· Uncontrolled diabetes. °· A weakened immune system. °SYMPTOMS  °Symptoms can vary depending on the cause of the vaginitis. Common symptoms include: °· Abnormal vaginal discharge. °¨ The discharge is white, gray, or yellow with bacterial vaginosis. °¨ The discharge is thick, white, and cheesy with a yeast infection. °¨ The discharge is frothy and yellow or greenish with trichomoniasis. °· A bad vaginal odor. °¨ The odor is fishy with bacterial vaginosis. °· Vaginal itching, pain, or swelling. °· Painful intercourse. °· Pain or burning when urinating. °Sometimes, there are no symptoms. °TREATMENT  °Treatment will vary depending on the type of infection.  °· Bacterial vaginosis and trichomoniasis are often treated with antibiotic creams or pills. °· Yeast infections are often treated with antifungal medicines, such as vaginal creams or suppositories. °· Viral vaginitis has no cure, but symptoms can be treated with medicines that relieve discomfort. Your sexual partner should be treated as well. °· Atrophic vaginitis may be treated with an estrogen cream, pill, suppository, or vaginal ring. If vaginal dryness occurs, lubricants and moisturizing creams may help. You may be told to avoid scented soaps, sprays, or douches. °· Allergic vaginitis treatment involves quitting the use of the product that is causing the problem. Vaginal creams can be used to treat the  symptoms. °HOME CARE INSTRUCTIONS  °· Take all medicines as directed by your caregiver. °· Keep your genital area clean and dry. Avoid soap and only rinse the area with water. °· Avoid douching. It can remove the healthy bacteria in the vagina. °· Do not use tampons or have sexual intercourse until your vaginitis has been treated. Use sanitary pads while you have vaginitis. °· Wipe from front to back. This avoids the spread of bacteria from the rectum to the vagina. °· Let air reach your genital area. °¨ Wear cotton underwear to decrease moisture buildup. °¨ Avoid wearing underwear while you sleep until your vaginitis is gone. °¨ Avoid tight pants and underwear or nylons without a cotton panel. °¨ Take off wet clothing (especially bathing suits) as soon as possible. °· Use mild, non-scented products. Avoid using irritants, such as: °¨ Scented feminine sprays. °¨ Fabric softeners. °¨ Scented detergents. °¨ Scented tampons. °¨ Scented soaps or bubble baths. °· Practice safe sex and use condoms. Condoms may prevent the spread of trichomoniasis and viral vaginitis. °SEEK MEDICAL CARE IF:  °· You have abdominal pain. °· You have a fever or persistent symptoms for more than 2-3 days. °· You have a fever and your symptoms suddenly get worse. °Document Released: 07/11/2007 Document Revised: 06/07/2012 Document Reviewed: 02/24/2012 °ExitCare® Patient Information ©2015 [employer]. This information is not intended to replace advice given to you by your health care provider. Make sure you discuss any questions you have with your health care provider. ° °

## 2014-08-02 LAB — CERVICOVAGINAL ANCILLARY ONLY
CHLAMYDIA, DNA PROBE: NEGATIVE
NEISSERIA GONORRHEA: NEGATIVE
WET PREP (BD AFFIRM): NEGATIVE
Wet Prep (BD Affirm): NEGATIVE
Wet Prep (BD Affirm): POSITIVE — AB

## 2014-08-10 NOTE — ED Notes (Signed)
GC/Chlamydia neg., Affirm: Candida and Trich neg., Gardnerella pos. Pt. treated with Cleocin.  Dr. Konrad DoloresMerrell said it is adequate as an alternate therapy. 08/10/2014

## 2014-11-05 ENCOUNTER — Emergency Department (HOSPITAL_COMMUNITY): Payer: Managed Care, Other (non HMO)

## 2014-11-05 ENCOUNTER — Encounter (HOSPITAL_COMMUNITY): Payer: Self-pay | Admitting: Emergency Medicine

## 2014-11-05 ENCOUNTER — Emergency Department (HOSPITAL_COMMUNITY)
Admission: EM | Admit: 2014-11-05 | Discharge: 2014-11-06 | Disposition: A | Discharge status: Home / Self Care | Payer: Managed Care, Other (non HMO) | Attending: Emergency Medicine | Admitting: Emergency Medicine

## 2014-11-05 DIAGNOSIS — Y9389 Activity, other specified: Secondary | ICD-10-CM | POA: Insufficient documentation

## 2014-11-05 DIAGNOSIS — Z8619 Personal history of other infectious and parasitic diseases: Secondary | ICD-10-CM | POA: Diagnosis not present

## 2014-11-05 DIAGNOSIS — Z23 Encounter for immunization: Secondary | ICD-10-CM | POA: Insufficient documentation

## 2014-11-05 DIAGNOSIS — W231XXA Caught, crushed, jammed, or pinched between stationary objects, initial encounter: Secondary | ICD-10-CM | POA: Diagnosis not present

## 2014-11-05 DIAGNOSIS — Y998 Other external cause status: Secondary | ICD-10-CM | POA: Insufficient documentation

## 2014-11-05 DIAGNOSIS — S62632B Displaced fracture of distal phalanx of right middle finger, initial encounter for open fracture: Secondary | ICD-10-CM | POA: Insufficient documentation

## 2014-11-05 DIAGNOSIS — Z792 Long term (current) use of antibiotics: Secondary | ICD-10-CM | POA: Insufficient documentation

## 2014-11-05 DIAGNOSIS — S62639B Displaced fracture of distal phalanx of unspecified finger, initial encounter for open fracture: Secondary | ICD-10-CM

## 2014-11-05 DIAGNOSIS — Y9289 Other specified places as the place of occurrence of the external cause: Secondary | ICD-10-CM | POA: Diagnosis not present

## 2014-11-05 DIAGNOSIS — S68119A Complete traumatic metacarpophalangeal amputation of unspecified finger, initial encounter: Secondary | ICD-10-CM

## 2014-11-05 DIAGNOSIS — S6992XA Unspecified injury of left wrist, hand and finger(s), initial encounter: Secondary | ICD-10-CM | POA: Diagnosis present

## 2014-11-05 MED ORDER — FENTANYL CITRATE 0.05 MG/ML IJ SOLN
50.0000 ug | Freq: Once | INTRAMUSCULAR | Status: AC
  Administered 2014-11-05: 50 ug via INTRAVENOUS
  Filled 2014-11-05: qty 2

## 2014-11-05 MED ORDER — OXYCODONE-ACETAMINOPHEN 5-325 MG PO TABS
2.0000 | ORAL_TABLET | Freq: Once | ORAL | Status: AC
  Administered 2014-11-05: 2 via ORAL
  Filled 2014-11-05: qty 2

## 2014-11-05 MED ORDER — TETANUS-DIPHTH-ACELL PERTUSSIS 5-2.5-18.5 LF-MCG/0.5 IM SUSP
0.5000 mL | Freq: Once | INTRAMUSCULAR | Status: AC
  Administered 2014-11-06: 0.5 mL via INTRAMUSCULAR
  Filled 2014-11-05: qty 0.5

## 2014-11-05 MED ORDER — BUPIVACAINE HCL 0.25 % IJ SOLN
10.0000 mL | Freq: Once | INTRAMUSCULAR | Status: AC
  Administered 2014-11-05: 10 mL
  Filled 2014-11-05: qty 10

## 2014-11-05 NOTE — ED Notes (Signed)
Pt arrived via EMS to the ED with a complaint of a left pinky evulsion.  Pt caught her hand in a door .  Left pinky is cut at the top of the nail bed.  Nail is black. Pt has pulses to other fingers but finger is too tender to touch.

## 2014-11-05 NOTE — ED Provider Notes (Signed)
CSN: [id number]     Arrival date & time 11/05/14  2055 History   First MD Initiated Contact with Patient 11/05/14 2111     Chief Complaint  Patient presents with  . Finger Injury     (Consider location/radiation/quality/duration/timing/severity/associated sxs/prior Treatment) HPI Comments: Social 34 year old female who presents for left pinky injury. Patient slammed the left pinky" before he arrived with partial amputation of the distal phalanx. She claims a severe pain. Patient is right-hand dominant.  Patient is a 34 y.o. female presenting with hand injury. The history is provided by the patient.  Hand Injury Location:  Finger Time since incident:  1 hour Injury: yes   Mechanism of injury comment:  Partial amputation Finger location:  L little finger Pain details:    Quality:  Aching   Radiates to:  Does not radiate   Severity:  Severe   Onset quality:  Sudden   Timing:  Constant   Progression:  Unchanged Chronicity:  New Handedness:  Right-handed Foreign body present:  No foreign bodies Associated symptoms: decreased range of motion and swelling   Associated symptoms: no back pain, no fatigue, no fever, no muscle weakness, no neck pain, no numbness and no stiffness   Risk factors: no known bone disorder, no frequent fractures and no recent illness     Past Medical History  Diagnosis Date  . Herpes 2002  . Trichimoniasis 12-2010  . Infection    Past Surgical History  Procedure Laterality Date  . Tubal ligation  12-05-2010  . Laparoscopy  11/27/2011    Procedure: LAPAROSCOPY OPERATIVE;  Surgeon: Catalina AntiguaPeggy Constant, MD;  Location: [facility];  Service: Gynecology;  Laterality: Bilateral;  . Dilation and curettage of uterus    . Salpingectomy      Bilateral for twin ectopic preg.    Family History  Problem Relation Age of Onset  . Anesthesia problems Neg Hx    History  Substance Use Topics  . Smoking status: Never Smoker   . Smokeless tobacco: Never Used  . Alcohol Use:  No   OB History    Gravida Para Term Preterm AB TAB SAB Ectopic Multiple Living   7 5   2 1  1  5      Review of Systems  Constitutional: Negative for fever, chills and fatigue.  HENT: Negative for trouble swallowing.   Respiratory: Negative for shortness of breath.   Cardiovascular: Negative for chest pain.  Gastrointestinal: Negative for nausea, vomiting, abdominal pain, diarrhea and constipation.  Genitourinary: Negative for dysuria and hematuria.  Musculoskeletal: Negative for myalgias, back pain, arthralgias, stiffness and neck pain.  Skin: Positive for wound. Negative for rash.  Neurological: Negative for numbness.  Hematological: Negative.   All other systems reviewed and are negative.     Allergies  Review of patient's allergies indicates no known allergies.  Home Medications   Prior to Admission medications   Medication Sig Start Date End Date Taking? Authorizing Provider  cephALEXin (KEFLEX) 500 MG capsule Take 1 capsule (500 mg total) by mouth 4 (four) times daily. 11/06/14   Arthor CaptainAbigail Kimberley Dastrup, PA-C  clindamycin (CLEOCIN) 300 MG capsule Take 1 capsule (300 mg total) by mouth 2 (two) times daily. Patient not taking: Reported on 11/05/2014 08/01/14   Hayden Rasmussenavid Mabe, NP  HYDROcodone-acetaminophen (NORCO) 5-325 MG per tablet Take 1-2 tablets by mouth every 6 (six) hours as needed for moderate pain. 11/06/14   Arthor CaptainAbigail Merril Isakson, PA-C   BP 149/86 mmHg  Pulse 86  Temp(Src) 98.4 F (36.9  C) (Oral)  Resp 16  SpO2 100%  LMP 10/25/2014 Physical Exam  Constitutional: She is oriented to person, place, and time. She appears well-developed and well-nourished. No distress.  HENT:  Head: Normocephalic and atraumatic.  Eyes: Conjunctivae are normal. No scleral icterus.  Neck: Normal range of motion.  Cardiovascular: Normal rate, regular rhythm and normal heart sounds.  Exam reveals no gallop and no friction rub.   No murmur heard. Pulmonary/Chest: Effort normal and breath sounds normal. No  respiratory distress.  Abdominal: Soft. Bowel sounds are normal. She exhibits no distension and no mass. There is no tenderness. There is no guarding.  Musculoskeletal:       Hands: Neurological: She is alert and oriented to person, place, and time.  Skin: Skin is warm and dry. She is not diaphoretic.  Nursing note and vitals reviewed.     ED Course  NERVE BLOCK Date/Time: 11/06/2014 10:07 AM Performed by: Arthor Captain Authorized by: Arthor Captain Consent: Verbal consent obtained. Risks and benefits: risks, benefits and alternatives were discussed Patient identity confirmed: verbally with patient Indications: pain relief Body area: upper extremity Nerve: digital Laterality: left Preparation: Patient was prepped and draped in the usual sterile fashion. Patient position: sitting Needle gauge: 25 G Location technique: anatomical landmarks Local anesthetic: bupivacaine 0.25% without epinephrine Anesthetic total: 10 ml Outcome: pain improved Patient tolerance: Patient tolerated the procedure well with no immediate complications  SPLINT APPLICATION Date/Time: 11/06/2014 10:08 AM Performed by: Arthor Captain Authorized by: Arthor Captain Consent: Verbal consent obtained. Risks and benefits: risks, benefits and alternatives were discussed Patient identity confirmed: verbally with patient Location details: left small finger Splint type: static finger Supplies used: aluminum splint,  cotton padding and elastic bandage Post-procedure: The splinted body part was neurovascularly unchanged following the procedure. Patient tolerance: Patient tolerated the procedure well with no immediate complications   (including critical care time) Labs Review Labs Reviewed - No data to display  Imaging Review Dg Hand Complete Left  11/05/2014   CLINICAL DATA:  Left fifth finger injury after being caught in closing door. Initial encounter.  EXAM: LEFT HAND - COMPLETE 3+ VIEW  COMPARISON:   None.  FINDINGS: Moderately displaced and comminuted fracture is seen involving the distal tuft of the fifth distal phalanx. No other bony abnormality is noted. No radiopaque foreign body is noted. This fracture appears to be closed and posttraumatic.  IMPRESSION: Moderately displaced and comminuted fracture involving the distal tuft of the fifth distal phalanx.   Electronically Signed   By: Lupita Raider, M.D.   On: 11/05/2014 21:22     EKG Interpretation None      MDM   Final diagnoses:  Open fracture of tuft of distal phalanx of finger, initial encounter  Traumatic amputation of fingertip, initial encounter    10:15 AM BP 149/86 mmHg  Pulse 86  Temp(Src) 98.4 F (36.9 C) (Oral)  Resp 16  SpO2 100%  LMP 10/25/2014 Patient with open distal tuft facture. I have placed a call to Dr. Amanda Pea  I have spoken with Dr. Amanda Pea. I offered repair. Given that there is only a partial amputation and the distal tip appears to have good vascular supply, Dr. Amanda Pea asks to have the patient com to the office in the morning at 7:15 o have the repair done there. I have blocked the patient's left 5th digit with complete pain relief. The wound was cleansed, Her tetanus was updated. patient given a dose of Keflex here in the ED. She  was bandaged with sterile gauze and splinted with the distal tip of the finger in extension the DIP to provide the best compression and hold the partial amputation together. The patient is discharged with pain medication, keflex, and explicit instructions to follow up at 7:15 am with Dr. Amanda Pea at [facility] ortho.    Arthor Captain, PA-C 11/06/14 1015  Linwood Dibbles, MD 11/06/14 [phone]

## 2014-11-06 MED ORDER — HYDROCODONE-ACETAMINOPHEN 5-325 MG PO TABS: 1.0000 | ORAL_TABLET | Freq: Four times a day (QID) | ORAL | Status: DC | PRN

## 2014-11-06 MED ORDER — CEPHALEXIN 500 MG PO CAPS: 500.0000 mg | ORAL_CAPSULE | Freq: Four times a day (QID) | ORAL | Status: DC

## 2014-11-06 MED ORDER — CEPHALEXIN 500 MG PO CAPS
1000.0000 mg | ORAL_CAPSULE | Freq: Once | ORAL | Status: AC
  Administered 2014-11-06: 1000 mg via ORAL
  Filled 2014-11-06: qty 2

## 2014-11-06 NOTE — Discharge Instructions (Signed)
Fingertip Injuries and Amputations °Fingertip injuries are common and often get injured because they are last to escape when pulling your hand out of harm's way. You have amputated (cut off) part of your finger. How this turns out depends largely on how much was amputated. If just the tip is amputated, often the end of the finger will grow back and the finger may return to much the same as it was before the injury.  °If more of the finger is missing, your caregiver has done the best with the tissue remaining to allow you to keep as much finger as is possible. Your caregiver after checking your injury has tried to leave you with a painless fingertip that has durable, feeling skin. If possible, your caregiver has tried to maintain the finger's length and appearance and preserve its fingernail.  °Please read the instructions outlined below and refer to this sheet in the next few weeks. These instructions provide you with general information on caring for yourself. Your caregiver may also give you specific instructions. While your treatment has been done according to the most current medical practices available, unavoidable complications occasionally occur. If you have any problems or questions after discharge, please call your caregiver. °HOME CARE INSTRUCTIONS  °· You may resume normal diet and activities as directed or allowed. °· Keep your hand elevated above the level of your heart. This helps decrease pain and swelling. °· Keep ice packs (or a bag of ice wrapped in a towel) on the injured area for 15-20 minutes, 03-04 times per day, for the first two days. °· Change dressings if necessary or as directed. °· Clean the wound daily or as directed. °· Only take over-the-counter or prescription medicines for pain, discomfort, or fever as directed by your caregiver. °· Keep appointments as directed. °SEEK IMMEDIATE MEDICAL CARE IF: °· You develop redness, swelling, numbness or increasing pain in the wound. °· There is  pus coming from the wound. °· You develop an unexplained oral temperature above 102° F (38.9° C) or as your caregiver suggests. °· There is a foul (bad) smell coming from the wound or dressing. °· There is a breaking open of the wound (edges not staying together) after sutures or staples have been removed. °MAKE SURE YOU:  °· Understand these instructions. °· Will watch your condition. °· Will get help right away if you are not doing well or get worse. °Document Released: 08/04/2005 Document Revised: 12/06/2011 Document Reviewed: 07/03/2008 °[employer]® Patient Information ©2015 [employer]. This information is not intended to replace advice given to you by your health care provider. Make sure you discuss any questions you have with your health care provider. ° °

## 2015-07-01 ENCOUNTER — Emergency Department (HOSPITAL_COMMUNITY)
Admission: EM | Admit: 2015-07-01 | Discharge: 2015-07-01 | Discharge status: Absent without leave | Payer: Managed Care, Other (non HMO) | Source: Home / Self Care

## 2015-08-05 ENCOUNTER — Emergency Department (HOSPITAL_COMMUNITY)
Admission: EM | Admit: 2015-08-05 | Discharge: 2015-08-05 | Disposition: A | Discharge status: Home / Self Care | Payer: Managed Care, Other (non HMO) | Attending: Emergency Medicine | Admitting: Emergency Medicine

## 2015-08-05 ENCOUNTER — Encounter (HOSPITAL_COMMUNITY): Payer: Self-pay

## 2015-08-05 DIAGNOSIS — Z202 Contact with and (suspected) exposure to infections with a predominantly sexual mode of transmission: Secondary | ICD-10-CM | POA: Diagnosis not present

## 2015-08-05 DIAGNOSIS — N898 Other specified noninflammatory disorders of vagina: Secondary | ICD-10-CM | POA: Insufficient documentation

## 2015-08-05 DIAGNOSIS — Z8619 Personal history of other infectious and parasitic diseases: Secondary | ICD-10-CM | POA: Insufficient documentation

## 2015-08-05 DIAGNOSIS — Z3202 Encounter for pregnancy test, result negative: Secondary | ICD-10-CM | POA: Diagnosis not present

## 2015-08-05 DIAGNOSIS — Z711 Person with feared health complaint in whom no diagnosis is made: Secondary | ICD-10-CM

## 2015-08-05 LAB — URINALYSIS, ROUTINE W REFLEX MICROSCOPIC
Bilirubin Urine: NEGATIVE
Glucose, UA: NEGATIVE mg/dL
Ketones, ur: NEGATIVE mg/dL
Leukocytes, UA: NEGATIVE
Nitrite: NEGATIVE
Protein, ur: NEGATIVE mg/dL
SPECIFIC GRAVITY, URINE: 1.006 (ref 1.005–1.030)
UROBILINOGEN UA: 0.2 mg/dL (ref 0.0–1.0)
pH: 6 (ref 5.0–8.0)

## 2015-08-05 LAB — GC/CHLAMYDIA PROBE AMP (~~LOC~~) NOT AT ARMC
Chlamydia: NEGATIVE
NEISSERIA GONORRHEA: NEGATIVE

## 2015-08-05 LAB — URINE MICROSCOPIC-ADD ON

## 2015-08-05 LAB — RPR: RPR: NONREACTIVE

## 2015-08-05 LAB — WET PREP, GENITAL
CLUE CELLS WET PREP: NONE SEEN
Trich, Wet Prep: NONE SEEN
Yeast Wet Prep HPF POC: NONE SEEN

## 2015-08-05 LAB — HIV ANTIBODY (ROUTINE TESTING W REFLEX): HIV SCREEN 4TH GENERATION: NONREACTIVE

## 2015-08-05 LAB — POC URINE PREG, ED
PREG TEST UR: NEGATIVE
Preg Test, Ur: NEGATIVE

## 2015-08-05 MED ORDER — FLUCONAZOLE 150 MG PO TABS: 150.0000 mg | ORAL_TABLET | Freq: Once | ORAL | Status: DC

## 2015-08-05 MED ORDER — CEFTRIAXONE SODIUM 250 MG IJ SOLR
250.0000 mg | Freq: Once | INTRAMUSCULAR | Status: AC
  Administered 2015-08-05: 250 mg via INTRAMUSCULAR
  Filled 2015-08-05: qty 250

## 2015-08-05 MED ORDER — LIDOCAINE HCL (PF) 1 % IJ SOLN
INTRAMUSCULAR | Status: AC
  Administered 2015-08-05: 08:00:00
  Filled 2015-08-05: qty 5

## 2015-08-05 MED ORDER — AZITHROMYCIN 250 MG PO TABS
1000.0000 mg | ORAL_TABLET | Freq: Once | ORAL | Status: AC
  Administered 2015-08-05: 1000 mg via ORAL
  Filled 2015-08-05: qty 4

## 2015-08-05 NOTE — ED Provider Notes (Signed)
CSN: [id number]     Arrival date & time 08/05/15  0453 History   First MD Initiated Contact with Patient 08/05/15 0602     Chief Complaint  Patient presents with  . Abdominal Pain  . Vaginal Discharge   Theresa Mooney is a 34 y.o. female with a history of herpes, and trichomoniasis who presents to the ED complaining of vaginal discharge ongoing for the past year as well as generalized abdominal pain starting yesterday. Patient reports she's been having generalized abdominal pain since yesterday and has had several loose stools but denies diarrhea. She reports her stools were formed. She reports she was having some generalized abdominal aching which she relates to drinking herbal life shake yesterday. She reports her now she has no pain reports she feels hungry. She reports she's been eating and drinking normally. She reports she is sexually active and not always using protection. She reports her last menstrual cycle was 07/14/2015. She reports she was diagnosed with bacterial vaginosis a year ago at urgent care and she never got her prescription filled. She also reports a history of herpes and trichomonas. She denies nausea or vomiting. Patient reports history of tubal ligation. Patient denies any fever, current abdominal pain, nausea, vomiting, diarrhea, hematemesis, hematochezia, urinary symptoms, hematuria, vaginal bleeding, or rashes.   (Consider location/radiation/quality/duration/timing/severity/associated sxs/prior Treatment) HPI  Past Medical History  Diagnosis Date  . Herpes 2002  . Trichimoniasis 12-2010  . Infection    Past Surgical History  Procedure Laterality Date  . Tubal ligation  12-05-2010  . Laparoscopy  11/27/2011    Procedure: LAPAROSCOPY OPERATIVE;  Surgeon: Catalina Antigua, MD;  Location: [facility];  Service: Gynecology;  Laterality: Bilateral;  . Dilation and curettage of uterus    . Salpingectomy      Bilateral for twin ectopic preg.    Family History  Problem Relation  Age of Onset  . Anesthesia problems Neg Hx    Social History  Substance Use Topics  . Smoking status: Never Smoker   . Smokeless tobacco: Never Used  . Alcohol Use: No   OB History    Gravida Para Term Preterm AB TAB SAB Ectopic Multiple Living   Review of Systems  Constitutional: Negative for fever and chills.  HENT: Negative for congestion and sore throat.   Eyes: Negative for visual disturbance.  Respiratory: Negative for cough and shortness of breath.   Cardiovascular: Negative for chest pain and palpitations.  Gastrointestinal: Positive for abdominal pain. Negative for nausea, vomiting, diarrhea, constipation and blood in stool.  Genitourinary: Positive for vaginal discharge. Negative for dysuria, urgency, frequency, hematuria, flank pain, vaginal bleeding and difficulty urinating.  Musculoskeletal: Negative for back pain and neck pain.  Skin: Negative for rash.  Neurological: Negative for dizziness, syncope, light-headedness and headaches.      Allergies  Review of patient's allergies indicates no known allergies.  Home Medications   Prior to Admission medications   Medication Sig Start Date End Date Taking? Authorizing Provider  cephALEXin (KEFLEX) 500 MG capsule Take 1 capsule (500 mg total) by mouth 4 (four) times daily. Patient not taking: Reported on 08/05/2015 11/06/14   Arthor Captain, PA-C  clindamycin (CLEOCIN) 300 MG capsule Take 1 capsule (300 mg total) by mouth 2 (two) times daily. Patient not taking: Reported on 11/05/2014 08/01/14   Hayden Rasmussen, NP  fluconazole (DIFLUCAN) 150 MG tablet Take 1 tablet (150 mg total) by mouth  once. 08/05/15   Everlene FarrierWilliam Shannara Winbush, PA-C  HYDROcodone-acetaminophen (NORCO) 5-325 MG per tablet Take 1-2 tablets by mouth every 6 (six) hours as needed for moderate pain. Patient not taking: Reported on 08/05/2015 11/06/14   Arthor CaptainAbigail Harris, PA-C   BP 103/67 mmHg  Pulse 72  Temp(Src) 97.8 F (36.6 C) (Oral)  Resp 16  SpO2  99%  LMP 07/14/2015 Physical Exam  Constitutional: She appears well-developed and well-nourished. No distress.  Nontoxic-appearing.  HENT:  Head: Normocephalic and atraumatic.  Mouth/Throat: Oropharynx is clear and moist.  Eyes: Conjunctivae are normal. Pupils are equal, round, and reactive to light. Right eye exhibits no discharge. Left eye exhibits no discharge.  Neck: Neck supple.  Cardiovascular: Normal rate, regular rhythm, normal heart sounds and intact distal pulses.  Exam reveals no gallop and no friction rub.   No murmur heard. Pulmonary/Chest: Effort normal and breath sounds normal. No respiratory distress. She has no wheezes. She has no rales.  Abdominal: Soft. Bowel sounds are normal. She exhibits no distension and no mass. There is tenderness. There is no rebound and no guarding.  Abdomen soft. Bowel sounds are present. Abdomen is non-tender to palpation. No right lower quadrant tenderness to palpation. No peritoneal signs. No psoas or obturator sign. No CVA or flank tenderness.  Genitourinary: Vaginal discharge found.  Pelvic exam performed by me with female RN as chaperone. The patient has a moderate amount of white vaginal discharge. No cervical motion tenderness. No adnexal tenderness or fullness. No vaginal bleeding.  Musculoskeletal: She exhibits no edema.  Lymphadenopathy:    She has no cervical adenopathy.  Neurological: She is alert. Coordination normal.  Skin: Skin is warm and dry. No rash noted. She is not diaphoretic. No erythema. No pallor.  Psychiatric: She has a normal mood and affect. Her behavior is normal.  Nursing note and vitals reviewed.   ED Course  Procedures (including critical care time) Labs Review Labs Reviewed  WET PREP, GENITAL - Abnormal; Notable for the following:    WBC, Wet Prep HPF POC FEW (*)    All other components within normal limits  URINALYSIS, ROUTINE W REFLEX MICROSCOPIC (NOT AT [facility]) - Abnormal; Notable for the following:     Hgb urine dipstick MODERATE (*)    All other components within normal limits  URINE MICROSCOPIC-ADD ON  HIV ANTIBODY (ROUTINE TESTING)  RPR  POC URINE PREG, ED  POC URINE PREG, ED  GC/CHLAMYDIA PROBE AMP ([place]) NOT AT [facility]    Imaging Review No results found. I have personally reviewed and evaluated these lab results as part of my medical decision-making.   EKG Interpretation None      Filed Vitals:   08/05/15 0511 08/05/15 0511 08/05/15 0630  BP: 120/69 120/69 103/67  Pulse: 70 65 72  Temp: 97.8 F (36.6 C) 97.8 F (36.6 C)   TempSrc: Oral    Resp: 18 16 16   SpO2: 99% 99% 99%     MDM   Meds given in ED:  Medications  cefTRIAXone (ROCEPHIN) injection 250 mg (not administered)  azithromycin (ZITHROMAX) tablet 1,000 mg (not administered)  lidocaine (PF) (XYLOCAINE) 1 % injection (not administered)    New Prescriptions   FLUCONAZOLE (DIFLUCAN) 150 MG TABLET    Take 1 tablet (150 mg total) by mouth once.    Final diagnoses:  Concern about STD in female without diagnosis  Vaginal discharge   This is a 34 y.o. female with a history of herpes, and trichomoniasis who presents to the  ED complaining of vaginal discharge ongoing for the past year as well as generalized abdominal pain starting yesterday. Patient reports she's been having generalized abdominal pain since yesterday and has had several loose stools but denies diarrhea. She reports her stools were formed. She reports she was having some generalized abdominal aching which she relates to drinking herbal life shake yesterday. She reports her now she has no pain reports she feels hungry. She reports she's been eating and drinking normally. She reports she is sexually active and not always using protection. She reports her last menstrual cycle was 07/14/2015. She reports she was diagnosed with bacterial vaginosis a year ago at urgent care and she never got her prescription filled. She also reports a history of herpes  and trichomonas. She denies nausea or vomiting.  On exam the patient is afebrile nontoxic-appearing. Her abdomen is soft and nontender to palpation. She is a moderate amount of white vaginal discharge on pelvic exam. No cervical motion tenderness. She is a negative urine pregnancy test. Urinalysis is negative for infection. Wet prep returned with few white blood cells but no yeast stricture or clue cells. HIV, RPR, gonorrhea and Chlamydia testing her progress. I advised the patient of these results and that STD testing is still pending. Will treat patient with Rocephin and azithromycin for possible STD. I encouraged her to make an appointment for follow-up with women's outpatient clinic. I advised the patient to follow-up with their primary care provider this week. I advised the patient to return to the emergency department with new or worsening symptoms or new concerns. The patient verbalized understanding and agreement with plan.      Everlene Farrier, PA-C 08/05/15 9528  April Palumbo, MD 08/05/15 [phone]

## 2015-08-05 NOTE — ED Notes (Signed)
Pt c/o vaginal discharge. Pt states she was dx w/ bacterial vaginosis in Nov 2015 and thinks this is related to not treating it. Pt reports new onset generalizing aching abd pain and nausea beginning this morning.

## 2015-08-05 NOTE — Discharge Instructions (Signed)
Sexually Transmitted Disease °A sexually transmitted disease (STD) is a disease or infection that may be passed (transmitted) from person to person, usually during sexual activity. This may happen by way of saliva, semen, blood, vaginal mucus, or urine. Common STDs include: °· Gonorrhea. °· Chlamydia. °· Syphilis. °· HIV and AIDS. °· Genital herpes. °· Hepatitis B and C. °· Trichomonas. °· Human papillomavirus (HPV). °· Pubic lice. °· Scabies. °· Mites. °· Bacterial vaginosis. °WHAT ARE CAUSES OF STDs? °An STD may be caused by bacteria, a virus, or parasites. STDs are often transmitted during sexual activity if one person is infected. However, they may also be transmitted through nonsexual means. STDs may be transmitted after:  °· Sexual intercourse with an infected person. °· Sharing sex toys with an infected person. °· Sharing needles with an infected person or using unclean piercing or tattoo needles. °· Having intimate contact with the genitals, mouth, or rectal areas of an infected person. °· Exposure to infected fluids during birth. °WHAT ARE THE SIGNS AND SYMPTOMS OF STDs? °Different STDs have different symptoms. Some people may not have any symptoms. If symptoms are present, they may include: °· Painful or bloody urination. °· Pain in the pelvis, abdomen, vagina, anus, throat, or eyes. °· A skin rash, itching, or irritation. °· Growths, ulcerations, blisters, or sores in the genital and anal areas. °· Abnormal vaginal discharge with or without bad odor. °· Penile discharge in men. °· Fever. °· Pain or bleeding during sexual intercourse. °· Swollen glands in the groin area. °· Yellow skin and eyes (jaundice). This is seen with hepatitis. °· Swollen testicles. °· Infertility. °· Sores and blisters in the mouth. °HOW ARE STDs DIAGNOSED? °To make a diagnosis, your health care provider may: °· Take a medical history. °· Perform a physical exam. °· Take a sample of any discharge to examine. °· Swab the throat,  cervix, opening to the penis, rectum, or vagina for testing. °· Test a sample of your first morning urine. °· Perform blood tests. °· Perform a Pap test, if this applies. °· Perform a colposcopy. °· Perform a laparoscopy. °HOW ARE STDs TREATED? °Treatment depends on the STD. Some STDs may be treated but not cured. °· Chlamydia, gonorrhea, trichomonas, and syphilis can be cured with antibiotic medicine. °· Genital herpes, hepatitis, and HIV can be treated, but not cured, with prescribed medicines. The medicines lessen symptoms. °· Genital warts from HPV can be treated with medicine or by freezing, burning (electrocautery), or surgery. Warts may come back. °· HPV cannot be cured with medicine or surgery. However, abnormal areas may be removed from the cervix, vagina, or vulva. °· If your diagnosis is confirmed, your recent sexual partners need treatment. This is true even if they are symptom-free or have a negative culture or evaluation. They should not have sex until their health care providers say it is okay. °· Your health care provider may test you for infection again 3 months after treatment. °HOW CAN I REDUCE MY RISK OF GETTING AN STD? °Take these steps to reduce your risk of getting an STD: °· Use latex condoms, dental dams, and water-soluble lubricants during sexual activity. Do not use petroleum jelly or oils. °· Avoid having multiple sex partners. °· Do not have sex with someone who has other sex partners °· Do not have sex with anyone you do not know or who is at high risk for an STD. °· Avoid risky sex practices that can break your skin. °· Do not have sex   if you have open sores on your mouth or skin.  Avoid drinking too much alcohol or taking illegal drugs. Alcohol and drugs can affect your judgment and put you in a vulnerable position.  Avoid engaging in oral and anal sex acts.  Get vaccinated for HPV and hepatitis. If you have not received these vaccines in the past, talk to your health care  provider about whether one or both might be right for you.  If you are at risk of being infected with HIV, it is recommended that you take a prescription medicine daily to prevent HIV infection. This is called pre-exposure prophylaxis (PrEP). You are considered at risk if:  You are a man who has sex with other men (MSM).  You are a heterosexual man or woman and are sexually active with more than one partner.  You take drugs by injection.  You are sexually active with a partner who has HIV.  Talk with your health care provider about whether you are at high risk of being infected with HIV. If you choose to begin PrEP, you should first be tested for HIV. You should then be tested every 3 months for as long as you are taking PrEP. WHAT SHOULD I DO IF I THINK I HAVE AN STD?  See your health care provider.  Tell your sexual partner(s). They should be tested and treated for any STDs.  Do not have sex until your health care provider says it is okay. WHEN SHOULD I GET IMMEDIATE MEDICAL CARE? Contact your health care provider right away if:   You have severe abdominal pain.  You are a man and notice swelling or pain in your testicles.  You are a woman and notice swelling or pain in your vagina.   This information is not intended to replace advice given to you by your health care provider. Make sure you discuss any questions you have with your health care provider.   Document Released: 12/04/2002 Document Revised: 10/04/2014 Document Reviewed: 04/03/2013 Elsevier Interactive Patient Education 2016 [employer] Monilial Vaginitis Vaginitis in a soreness, swelling and redness (inflammation) of the vagina and vulva. Monilial vaginitis is not a sexually transmitted infection. CAUSES  Yeast vaginitis is caused by yeast (candida) that is normally found in your vagina. With a yeast infection, the candida has overgrown in number to a point that upsets the chemical balance. SYMPTOMS   White,  thick vaginal discharge.  Swelling, itching, redness and irritation of the vagina and possibly the lips of the vagina (vulva).  Burning or painful urination.  Painful intercourse. DIAGNOSIS  Things that may contribute to monilial vaginitis are:  Postmenopausal and virginal states.  Pregnancy.  Infections.  Being tired, sick or stressed, especially if you had monilial vaginitis in the past.  Diabetes. Good control will help lower the chance.  Birth control pills.  Tight fitting garments.  Using bubble bath, feminine sprays, douches or deodorant tampons.  Taking certain medications that kill germs (antibiotics).  Sporadic recurrence can occur if you become ill. TREATMENT  Your caregiver will give you medication.  There are several kinds of anti monilial vaginal creams and suppositories specific for monilial vaginitis. For recurrent yeast infections, use a suppository or cream in the vagina 2 times a week, or as directed.  Anti-monilial or steroid cream for the itching or irritation of the vulva may also be used. Get your caregiver's permission.  Painting the vagina with methylene blue solution may help if the monilial cream does not work.  Eating yogurt may help prevent monilial vaginitis. HOME CARE INSTRUCTIONS   Finish all medication as prescribed.  Do not have sex until treatment is completed or after your caregiver tells you it is okay.  Take warm sitz baths.  Do not douche.  Do not use tampons, especially scented ones.  Wear cotton underwear.  Avoid tight pants and panty hose.  Tell your sexual partner that you have a yeast infection. They should go to their caregiver if they have symptoms such as mild rash or itching.  Your sexual partner should be treated as well if your infection is difficult to eliminate.  Practice safer sex. Use condoms.  Some vaginal medications cause latex condoms to fail. Vaginal medications that harm condoms are:  Cleocin  cream.  Butoconazole (Femstat).  Terconazole (Terazol) vaginal suppository.  Miconazole (Monistat) (may be purchased over the counter). SEEK MEDICAL CARE IF:   You have a temperature by mouth above 102 F (38.9 C).  The infection is getting worse after 2 days of treatment.  The infection is not getting better after 3 days of treatment.  You develop blisters in or around your vagina.  You develop vaginal bleeding, and it is not your menstrual period.  You have pain when you urinate.  You develop intestinal problems.  You have pain with sexual intercourse.   This information is not intended to replace advice given to you by your health care provider. Make sure you discuss any questions you have with your health care provider.   Document Released: 06/23/2005 Document Revised: 12/06/2011 Document Reviewed: 03/17/2015 Elsevier Interactive Patient Education 2016 [employer] Abdominal Pain, Adult Many things can cause abdominal pain. Usually, abdominal pain is not caused by a disease and will improve without treatment. It can often be observed and treated at home. Your health care provider will do a physical exam and possibly order blood tests and X-rays to help determine the seriousness of your pain. However, in many cases, more time must pass before a clear cause of the pain can be found. Before that point, your health care provider may not know if you need more testing or further treatment. HOME CARE INSTRUCTIONS Monitor your abdominal pain for any changes. The following actions may help to alleviate any discomfort you are experiencing:  Only take over-the-counter or prescription medicines as directed by your health care provider.  Do not take laxatives unless directed to do so by your health care provider.  Try a clear liquid diet (broth, tea, or water) as directed by your health care provider. Slowly move to a bland diet as tolerated. SEEK MEDICAL CARE IF:  You have  unexplained abdominal pain.  You have abdominal pain associated with nausea or diarrhea.  You have pain when you urinate or have a bowel movement.  You experience abdominal pain that wakes you in the night.  You have abdominal pain that is worsened or improved by eating food.  You have abdominal pain that is worsened with eating fatty foods.  You have a fever. SEEK IMMEDIATE MEDICAL CARE IF:  Your pain does not go away within 2 hours.  You keep throwing up (vomiting).  Your pain is felt only in portions of the abdomen, such as the right side or the left lower portion of the abdomen.  You pass bloody or black tarry stools. MAKE SURE YOU:  Understand these instructions.  Will watch your condition.  Will get help right away if you are not doing well or get worse.  This information is not intended to replace advice given to you by your health care provider. Make sure you discuss any questions you have with your health care provider.   Document Released: 06/23/2005 Document Revised: 06/04/2015 Document Reviewed: 05/23/2013 Elsevier Interactive Patient Education [employer].

## 2015-10-07 ENCOUNTER — Other Ambulatory Visit: Payer: Self-pay | Admitting: Advanced Practice Midwife

## 2015-11-23 ENCOUNTER — Emergency Department (HOSPITAL_COMMUNITY): Payer: Self-pay

## 2015-11-23 ENCOUNTER — Emergency Department (HOSPITAL_COMMUNITY)
Admission: EM | Admit: 2015-11-23 | Discharge: 2015-11-23 | Disposition: A | Discharge status: Home / Self Care | Payer: Self-pay | Attending: Emergency Medicine | Admitting: Emergency Medicine

## 2015-11-23 ENCOUNTER — Encounter (HOSPITAL_COMMUNITY): Payer: Self-pay | Admitting: *Deleted

## 2015-11-23 DIAGNOSIS — Y998 Other external cause status: Secondary | ICD-10-CM | POA: Insufficient documentation

## 2015-11-23 DIAGNOSIS — W1840XA Slipping, tripping and stumbling without falling, unspecified, initial encounter: Secondary | ICD-10-CM | POA: Insufficient documentation

## 2015-11-23 DIAGNOSIS — Y9301 Activity, walking, marching and hiking: Secondary | ICD-10-CM | POA: Insufficient documentation

## 2015-11-23 DIAGNOSIS — Y9289 Other specified places as the place of occurrence of the external cause: Secondary | ICD-10-CM | POA: Insufficient documentation

## 2015-11-23 DIAGNOSIS — Z8619 Personal history of other infectious and parasitic diseases: Secondary | ICD-10-CM | POA: Insufficient documentation

## 2015-11-23 DIAGNOSIS — S93602A Unspecified sprain of left foot, initial encounter: Secondary | ICD-10-CM | POA: Insufficient documentation

## 2015-11-23 MED ORDER — IBUPROFEN 800 MG PO TABS
800.0000 mg | ORAL_TABLET | Freq: Three times a day (TID) | ORAL | Status: DC
Start: 2015-11-23 — End: 2016-04-18

## 2015-11-23 MED ORDER — IBUPROFEN 400 MG PO TABS
800.0000 mg | ORAL_TABLET | Freq: Once | ORAL | Status: AC
  Administered 2015-11-23: 800 mg via ORAL
  Filled 2015-11-23: qty 2

## 2015-11-23 NOTE — Discharge Instructions (Signed)
Foot Sprain °A foot sprain is an injury to one of the strong bands of tissue (ligaments) that connect and support the many bones in your feet. The ligament can be stretched too much or it can tear. A tear can be either partial or complete. The severity of the sprain depends on how much of the ligament was damaged or torn. °CAUSES °A foot sprain is usually caused by suddenly twisting or pivoting your foot. °RISK FACTORS °This injury is more likely to occur in people who: °· Play a sport, such as basketball or football. °· Exercise or play a sport without warming up. °· Start a new workout or sport. °· Suddenly increase how long or hard they exercise or play a sport. °SYMPTOMS °Symptoms of this condition start soon after an injury and include: °· Pain, especially in the arch of the foot. °· Bruising. °· Swelling. °· Inability to walk or use the foot to support body weight. °DIAGNOSIS °This condition is diagnosed with a medical history and physical exam. You may also have imaging tests, such as: °· X-rays to make sure there are no broken bones (fractures). °· MRI to see if the ligament has torn. °TREATMENT °Treatment varies depending on the severity of your sprain. Mild sprains can be treated with rest, ice, compression, and elevation (RICE). If your ligament is overstretched or partially torn, treatment usually involves keeping your foot in a fixed position (immobilization) for a period of time. To help you do this, your health care provider will apply a bandage, splint, or walking boot to keep your foot from moving until it heals. You may also be advised to use crutches or a scooter for a few weeks to avoid bearing weight on your foot while it is healing. °If your ligament is fully torn, you may need surgery to reconnect the ligament to the bone. After surgery, a cast or splint will be applied and will need to stay on your foot while it heals. °Your health care provider may also suggest exercises or physical therapy  to strengthen your foot. °HOME CARE INSTRUCTIONS °If You Have a Bandage, Splint, or Walking Boot: °· Wear it as directed by your health care provider. Remove it only as directed by your health care provider. °· Loosen the bandage, splint, or walking boot if your toes become numb and tingle, or if they turn cold and blue. °Bathing °· If your health care provider approves bathing and showering, cover the bandage or splint with a watertight plastic bag to protect it from water. Do not let the bandage or splint get wet. °Managing Pain, Stiffness, and Swelling  °· If directed, apply ice to the injured area: °¨ Put ice in a plastic bag. °¨ Place a towel between your skin and the bag. °¨ Leave the ice on for 20 minutes, 2-3 times per day. °· Move your toes often to avoid stiffness and to lessen swelling. °· Raise (elevate) the injured area above the level of your heart while you are sitting or lying down. °Driving °· Do not drive or operate heavy machinery while taking pain medicine. °· Do not drive while wearing a bandage, splint, or walking boot on a foot that you use for driving. °Activity °· Rest as directed by your health care provider. °· Do not use the injured foot to support your body weight until your health care provider says that you can. Use crutches or other supportive devices as directed by your health care provider. °· Ask your health care   provider what activities are safe for you. Gradually increase how much and how far you walk until your health care provider says it is safe to return to full activity. °· Do any exercise or physical therapy as directed by your health care provider. °General Instructions °· If a splint was applied, do not put pressure on any part of it until it is fully hardened. This may take several hours. °· Take medicines only as directed by your health care provider. These include over-the-counter medicines and prescription medicines. °· Keep all follow-up visits as directed by your  health care provider. This is important. °· When you can walk without pain, wear supportive shoes that have stiff soles. Do not wear flip-flops, and do not walk barefoot. °SEEK MEDICAL CARE IF: °· Your pain is not controlled with medicine. °· Your bruising or swelling gets worse or does not get better with treatment. °· Your splint or walking boot is damaged. °SEEK IMMEDIATE MEDICAL CARE IF: °· Your foot is numb or blue. °· Your foot feels colder than normal. °  °This information is not intended to replace advice given to you by your health care provider. Make sure you discuss any questions you have with your health care provider. °  °Document Released: 03/05/2002 Document Revised: 01/28/2015 Document Reviewed: 07/17/2014 °Elsevier Interactive Patient Education ©2016 [employer] ° ° °RICE for Routine Care of Injuries °The routine care of many injuries includes rest, ice, compression, and elevation (RICE therapy). RICE therapy is often recommended for injuries to soft tissues, such as a muscle strain, ligament injuries, bruises, and overuse injuries. It can also be used for some bony injuries. Using RICE therapy can help to relieve pain, lessen swelling, and enable your body to heal. °Rest °Rest is required to allow your body to heal. This usually involves reducing your normal activities and avoiding use of the injured part of your body. Generally, you can return to your normal activities when you are comfortable and have been given permission by your health care provider. °Ice °Icing your injury helps to keep the swelling down, and it lessens pain. Do not apply ice directly to your skin. °· Put ice in a plastic bag. °· Place a towel between your skin and the bag. °· Leave the ice on for 20 minutes, 2-3 times a day. °Do this for as long as you are directed by your health care provider. °Compression °Compression means putting pressure on the injured area. Compression helps to keep swelling down, gives support, and  helps with discomfort. Compression may be done with an elastic bandage. If an elastic bandage has been applied, follow these general tips: °· Remove and reapply the bandage every 3-4 hours or as directed by your health care provider. °· Make sure the bandage is not wrapped too tightly, because this can cut off circulation. If part of your body beyond the bandage becomes blue, numb, cold, swollen, or more painful, your bandage is most likely too tight. If this occurs, remove your bandage and reapply it more loosely. °· See your health care provider if the bandage seems to be making your problems worse rather than better. °Elevation °Elevation means keeping the injured area raised. This helps to lessen swelling and decrease pain. If possible, your injured area should be elevated at or above the level of your heart or the center of your chest. °WHEN SHOULD I SEEK MEDICAL CARE? °You should seek medical care if: °· Your pain and swelling continue. °· Your symptoms are getting worse rather than improving. °These   symptoms may indicate that further evaluation or further X-rays are needed. Sometimes, X-rays may not show a small broken bone (fracture) until a number of days later. Make a follow-up appointment with your health care provider. °WHEN SHOULD I SEEK IMMEDIATE MEDICAL CARE? °You should seek immediate medical care if: °· You have sudden severe pain at or below the area of your injury. °· You have redness or increased swelling around your injury. °· You have tingling or numbness at or below the area of your injury that does not improve after you remove the elastic bandage. °  °This information is not intended to replace advice given to you by your health care provider. Make sure you discuss any questions you have with your health care provider. °  °Document Released: 12/26/2000 Document Revised: 06/04/2015 Document Reviewed: 08/21/2014 °Elsevier Interactive Patient Education ©2016 [employer] Inc[employer] ° °

## 2015-11-23 NOTE — ED Notes (Signed)
Pt reports turning her Lt ankle on Friday night.

## 2015-11-23 NOTE — ED Notes (Signed)
PT refused crutches at time of DC.

## 2015-11-23 NOTE — ED Provider Notes (Signed)
CSN: [id number]     Arrival date & time 11/23/15  1723 History  By signing my name below, I, Evon Slack, attest that this documentation has been prepared under the direction and in the presence of Danelle Berry, PA-C. Electronically Signed: Evon Slack, ED Scribe. 11/23/2015. 5:53 PM.      Chief Complaint  Patient presents with  . Foot Injury   The history is provided by the patient. No language interpreter was used.   HPI Comments: Theresa Mooney is a 35 y.o. female who presents to the Emergency Department complaining of improving left foot injury onset 2 days prior. She rates the severity of her pain 6/10. Pt states that she rolled her ankle as she was walking down the steps. Pt states she has associated bruising. Pt states that the pain is worse when ambulating, bearing weight or moving the foot. Pt denies any medications PTA. Pt denies numbness or tingling.    Past Medical History  Diagnosis Date  . Herpes 2002  . Trichimoniasis 12-2010  . Infection    Past Surgical History  Procedure Laterality Date  . Tubal ligation  12-05-2010  . Laparoscopy  11/27/2011    Procedure: LAPAROSCOPY OPERATIVE;  Surgeon: Catalina Antigua, MD;  Location: [facility];  Service: Gynecology;  Laterality: Bilateral;  . Dilation and curettage of uterus    . Salpingectomy      Bilateral for twin ectopic preg.    Family History  Problem Relation Age of Onset  . Anesthesia problems Neg Hx    Social History  Substance Use Topics  . Smoking status: Never Smoker   . Smokeless tobacco: Never Used  . Alcohol Use: No   OB History    Gravida Para Term Preterm AB TAB SAB Ectopic Multiple Living   Review of Systems  Musculoskeletal: Positive for joint swelling (improving) and arthralgias.  Neurological: Negative for numbness.  All other systems reviewed and are negative.     Allergies  Review of patient's allergies indicates no known allergies.  Home Medications   Prior to  Admission medications   Medication Sig Start Date End Date Taking? Authorizing Provider  cephALEXin (KEFLEX) 500 MG capsule Take 1 capsule (500 mg total) by mouth 4 (four) times daily. Patient not taking: Reported on 08/05/2015 11/06/14   Arthor Captain, PA-C  clindamycin (CLEOCIN) 300 MG capsule Take 1 capsule (300 mg total) by mouth 2 (two) times daily. Patient not taking: Reported on 11/05/2014 08/01/14   Hayden Rasmussen, NP  fluconazole (DIFLUCAN) 150 MG tablet Take 1 tablet (150 mg total) by mouth once. 08/05/15   Everlene Farrier, PA-C  HYDROcodone-acetaminophen (NORCO) 5-325 MG per tablet Take 1-2 tablets by mouth every 6 (six) hours as needed for moderate pain. Patient not taking: Reported on 08/05/2015 11/06/14   Arthor Captain, PA-C  ibuprofen (ADVIL,MOTRIN) 800 MG tablet Take 1 tablet (800 mg total) by mouth 3 (three) times daily. 11/23/15   Danelle Berry, PA-C   BP 116/67 mmHg  Pulse 70  Temp(Src) 98.1 F (36.7 C) (Oral)  Resp 18  Ht  (1.499 m)  SpO2 99%  LMP 10/29/2015   Physical Exam  Constitutional: She is oriented to person, place, and time. She appears well-developed and well-nourished. No distress.  HENT:  Head: Normocephalic and atraumatic.  Right Ear: External ear normal.  Left Ear: External ear normal.  Nose: Nose normal.  Eyes: Conjunctivae and EOM are normal.  Pupils are equal, round, and reactive to light. Right eye exhibits no discharge. Left eye exhibits no discharge. No scleral icterus.  Neck: Normal range of motion. Neck supple. No JVD present. No tracheal deviation present.  Cardiovascular: Normal rate and regular rhythm.   Pulmonary/Chest: Effort normal and breath sounds normal. No stridor. No respiratory distress.  Musculoskeletal: Normal range of motion. She exhibits tenderness. She exhibits no edema.       Left ankle: She exhibits normal range of motion, no swelling, no ecchymosis, no deformity and normal pulse. Tenderness. Head of 5th metatarsal tenderness found.  No lateral malleolus, no medial malleolus, no AITFL, no CF ligament, no posterior TFL and no proximal fibula tenderness found. Achilles tendon normal. Achilles tendon exhibits no pain, no defect and normal Thompson's test results.       Left foot: There is bony tenderness. There is normal range of motion, no tenderness, no swelling, normal capillary refill, no deformity and no laceration.       Feet:  Normal sensation, normal DP & TP pulses 2+, normal capillary refill  Lymphadenopathy:    She has no cervical adenopathy.  Neurological: She is alert and oriented to person, place, and time. She exhibits normal muscle tone. Coordination normal.  Skin: Skin is warm and dry. No rash noted. She is not diaphoretic. No erythema. No pallor.  Psychiatric: She has a normal mood and affect. Her behavior is normal. Judgment and thought content normal.  Nursing note and vitals reviewed.   ED Course  Procedures (including critical care time) DIAGNOSTIC STUDIES: Oxygen Saturation is 99% on RA, normal by my interpretation.    COORDINATION OF CARE: 5:49 PM-Discussed treatment plan with pt at bedside and pt agreed to plan.     Labs Review Labs Reviewed - No data to display  Imaging Review Dg Foot Complete Left  11/23/2015  CLINICAL DATA:  Left foot pain and lateral swelling. Inversion injury 11/21/2015 EXAM: LEFT FOOT - COMPLETE 3+ VIEW COMPARISON:  None. FINDINGS: Normal alignment at the Lisfranc joint. No fracture observed. Base of the fifth metatarsal appears intact. IMPRESSION: 1. No acute bony findings are identified. Electronically Signed   By: Gaylyn Rong M.D.   On: 11/23/2015 18:43      EKG Interpretation None      MDM   Pt with left foot injury and pain, inversion injury occurred 2 days ago, pt has improving pain and swelling, she is able to bare weight.  Xray negative for fx or dislocation Pt given ASO ankle splint, crutches.  Work note provided.  RICE tx reviewed.  Pt  discharged in good condition.   Final diagnoses:  Foot sprain, left, initial encounter   I personally performed the services described in this documentation, which was scribed in my presence. The recorded information has been reviewed and is accurate.       Danelle Berry, PA-C 11/23/15 1853  Doug Sou, MD 11/23/15 2322

## 2015-11-23 NOTE — ED Notes (Signed)
Declined W/C at D/C and was escorted to lobby by RN. 

## 2016-04-18 ENCOUNTER — Encounter (HOSPITAL_COMMUNITY): Payer: Self-pay

## 2016-04-18 ENCOUNTER — Emergency Department (HOSPITAL_COMMUNITY)
Admission: EM | Admit: 2016-04-18 | Discharge: 2016-04-18 | Disposition: A | Discharge status: Home / Self Care | Payer: Self-pay | Attending: Emergency Medicine | Admitting: Emergency Medicine

## 2016-04-18 DIAGNOSIS — Z711 Person with feared health complaint in whom no diagnosis is made: Secondary | ICD-10-CM

## 2016-04-18 DIAGNOSIS — N390 Urinary tract infection, site not specified: Secondary | ICD-10-CM | POA: Insufficient documentation

## 2016-04-18 DIAGNOSIS — Z202 Contact with and (suspected) exposure to infections with a predominantly sexual mode of transmission: Secondary | ICD-10-CM | POA: Insufficient documentation

## 2016-04-18 DIAGNOSIS — A599 Trichomoniasis, unspecified: Secondary | ICD-10-CM | POA: Insufficient documentation

## 2016-04-18 LAB — URINALYSIS, ROUTINE W REFLEX MICROSCOPIC
Glucose, UA: NEGATIVE mg/dL
KETONES UR: 15 mg/dL — AB
Nitrite: POSITIVE — AB
PROTEIN: NEGATIVE mg/dL
Specific Gravity, Urine: 1.021 (ref 1.005–1.030)
pH: 5.5 (ref 5.0–8.0)

## 2016-04-18 LAB — CBC WITH DIFFERENTIAL/PLATELET
BAND NEUTROPHILS: 1 %
BASOS PCT: 0 %
Basophils Absolute: 0 10*3/uL (ref 0.0–0.1)
Blasts: 0 %
EOS ABS: 0.1 10*3/uL (ref 0.0–0.7)
EOS PCT: 2 %
HCT: 33.7 % — ABNORMAL LOW (ref 36.0–46.0)
Hemoglobin: 10.5 g/dL — ABNORMAL LOW (ref 12.0–15.0)
LYMPHS ABS: 2.2 10*3/uL (ref 0.7–4.0)
Lymphocytes Relative: 38 %
MCH: 26.4 pg (ref 26.0–34.0)
MCHC: 31.2 g/dL (ref 30.0–36.0)
MCV: 84.7 fL (ref 78.0–100.0)
METAMYELOCYTES PCT: 2 %
MONO ABS: 0.2 10*3/uL (ref 0.1–1.0)
MYELOCYTES: 0 %
Monocytes Relative: 3 %
NRBC: 0 /100{WBCs}
Neutro Abs: 3.4 10*3/uL (ref 1.7–7.7)
Neutrophils Relative %: 53 %
Other: 0 %
PLATELETS: 301 10*3/uL (ref 150–400)
PROMYELOCYTES ABS: 1 %
RBC: 3.98 MIL/uL (ref 3.87–5.11)
RDW: 15.3 % (ref 11.5–15.5)
WBC: 5.9 10*3/uL (ref 4.0–10.5)

## 2016-04-18 LAB — WET PREP, GENITAL
Clue Cells Wet Prep HPF POC: NONE SEEN
Sperm: NONE SEEN
Yeast Wet Prep HPF POC: NONE SEEN

## 2016-04-18 LAB — POC URINE PREG, ED: PREG TEST UR: NEGATIVE

## 2016-04-18 LAB — URINE MICROSCOPIC-ADD ON

## 2016-04-18 MED ORDER — AZITHROMYCIN 250 MG PO TABS
1000.0000 mg | ORAL_TABLET | Freq: Once | ORAL | Status: AC
  Administered 2016-04-18: 1000 mg via ORAL
  Filled 2016-04-18: qty 4

## 2016-04-18 MED ORDER — METRONIDAZOLE 500 MG PO TABS
2000.0000 mg | ORAL_TABLET | Freq: Once | ORAL | Status: AC
  Administered 2016-04-18: 2000 mg via ORAL
  Filled 2016-04-18: qty 4

## 2016-04-18 MED ORDER — LIDOCAINE HCL (PF) 1 % IJ SOLN
INTRAMUSCULAR | Status: AC
  Administered 2016-04-18: 0.9 mL
  Filled 2016-04-18: qty 5

## 2016-04-18 MED ORDER — CEFTRIAXONE SODIUM 250 MG IJ SOLR
250.0000 mg | Freq: Once | INTRAMUSCULAR | Status: AC
  Administered 2016-04-18: 250 mg via INTRAMUSCULAR
  Filled 2016-04-18: qty 250

## 2016-04-18 MED ORDER — ONDANSETRON HCL 4 MG PO TABS
4.0000 mg | ORAL_TABLET | Freq: Once | ORAL | Status: AC
  Administered 2016-04-18: 4 mg via ORAL
  Filled 2016-04-18: qty 1

## 2016-04-18 MED ORDER — CEPHALEXIN 500 MG PO CAPS: 500.0000 mg | ORAL_CAPSULE | Freq: Two times a day (BID) | ORAL | 0 refills | Status: DC

## 2016-04-18 NOTE — ED Provider Notes (Signed)
[facility] Provider Note   CSN: [id number] Arrival date & time: 04/18/16  4098  First Provider Contact:  First MD Initiated Contact with Patient 04/18/16 [phone]        History   Chief Complaint Chief Complaint  Patient presents with  . Vaginal Discharge  . Dysuria    HPI Theresa Mooney is a 35 y.o. female.  HPI Theresa Mooney is a 35 y.o. female with PMH significant for Tubal ligation (2012), Herpes, trichomoniasis who presents with 3 day history of gradual onset, unchanging, moderate vaginal discharge and dysuria. Patient describes the discharge as white and thick. Associated symptoms include increased urinary frequency, mild vaginal pruritus, and mild low back pain. She denies fever, chills, nausea, vomiting, diarrhea, abdominal pain, or flank pain. She reports she has been taking Azo pills and increased her water intake. LNMP 04/11/2016. She is sexually active with one partner.  Past Medical History:  Diagnosis Date  . Herpes 2002  . Infection   . Trichimoniasis [phone]    Patient Active Problem List   Diagnosis Date Noted  . h/o postpartum BTL  12/13/2011  . S/P ectopic pregnancy 12/13/2011    Past Surgical History:  Procedure Laterality Date  . DILATION AND CURETTAGE OF UTERUS    . LAPAROSCOPY  11/27/2011   Procedure: LAPAROSCOPY OPERATIVE;  Surgeon: Catalina Antigua, MD;  Location: [facility];  Service: Gynecology;  Laterality: Bilateral;  . SALPINGECTOMY     Bilateral for twin ectopic preg.   . TUBAL LIGATION  12-05-2010    OB History    Gravida Para Term Preterm AB Living   SAB TAB Ectopic Multiple Live Births     1 1           Home Medications    Prior to Admission medications   Medication Sig Start Date End Date Taking? Authorizing Provider  cephALEXin (KEFLEX) 500 MG capsule Take 1 capsule (500 mg total) by mouth 2 (two) times daily. 04/18/16   Cheri Fowler, PA-C    Family History Family History  Problem Relation Age of Onset  .  Anesthesia problems Neg Hx     Social History Social History  Substance Use Topics  . Smoking status: Never Smoker  . Smokeless tobacco: Never Used  . Alcohol use No     Allergies   Review of patient's allergies indicates no known allergies.   Review of Systems Review of Systems All other systems negative unless otherwise stated in HPI   Physical Exam Updated Vital Signs BP 119/71   Pulse 85   Temp 98.6 F (37 C) (Oral)   Resp 18   Ht  (1.499 m)   Wt 91.6 kg   LMP 04/11/2016   SpO2 99%   BMI 40.80 kg/m   Physical Exam  Constitutional: She is oriented to person, place, and time. She appears well-developed and well-nourished.  Non-toxic appearance. She does not have a sickly appearance. She does not appear ill.  HENT:  Head: Normocephalic and atraumatic.  Mouth/Throat: Oropharynx is clear and moist.  Eyes: Conjunctivae are normal.  Neck: Normal range of motion. Neck supple.  Cardiovascular: Normal rate and regular rhythm.   Pulmonary/Chest: Effort normal and breath sounds normal. No accessory muscle usage or stridor. No respiratory distress. She has no wheezes. She has no rhonchi. She has no rales.  Abdominal: Soft. Bowel sounds are normal. She exhibits no distension and no mass. There is no  tenderness. There is no guarding.  No CVA tenderness bilaterally.  Genitourinary: There is no tenderness or lesion on the right labia. There is no tenderness or lesion on the left labia. Uterus is not tender. Cervix exhibits discharge. Cervix exhibits no motion tenderness and no friability. Right adnexum displays no tenderness. Left adnexum displays no tenderness. No tenderness in the vagina. Vaginal discharge found.  Genitourinary Comments: Chaperone present. Mild to moderate whitish green cervical discharge.  Musculoskeletal: Normal range of motion.  Lymphadenopathy:    She has no cervical adenopathy.  Neurological: She is alert and oriented to person, place, and time.    Speech clear without dysarthria.  Skin: Skin is warm and dry.  Psychiatric: She has a normal mood and affect. Her behavior is normal.     ED Treatments / Results  Labs (all labs ordered are listed, but only abnormal results are displayed) Labs Reviewed  WET PREP, GENITAL - Abnormal; Notable for the following:       Result Value   Trich, Wet Prep PRESENT (*)    WBC, Wet Prep HPF POC MANY (*)    All other components within normal limits  URINALYSIS, ROUTINE W REFLEX MICROSCOPIC (NOT AT [facility]) - Abnormal; Notable for the following:    Color, Urine ORANGE (*)    APPearance CLOUDY (*)    Hgb urine dipstick MODERATE (*)    Bilirubin Urine SMALL (*)    Ketones, ur 15 (*)    Nitrite POSITIVE (*)    Leukocytes, UA LARGE (*)    All other components within normal limits  CBC WITH DIFFERENTIAL/PLATELET - Abnormal; Notable for the following:    Hemoglobin 10.5 (*)    HCT 33.7 (*)    All other components within normal limits  URINE MICROSCOPIC-ADD ON - Abnormal; Notable for the following:    Squamous Epithelial / LPF 6-30 (*)    Bacteria, UA MANY (*)    All other components within normal limits  URINE CULTURE  RPR  HIV ANTIBODY (ROUTINE TESTING)  POC URINE PREG, ED  GC/CHLAMYDIA PROBE AMP (Theresa) NOT AT [facility]    EKG  EKG Interpretation None       Radiology No results found.  Procedures Procedures (including critical care time)  Medications Ordered in ED Medications  azithromycin (ZITHROMAX) tablet 1,000 mg (1,000 mg Oral Given 04/18/16 1051)  cefTRIAXone (ROCEPHIN) injection 250 mg (250 mg Intramuscular Given 04/18/16 1054)  metroNIDAZOLE (FLAGYL) tablet 2,000 mg (2,000 mg Oral Given 04/18/16 1052)  ondansetron (ZOFRAN) tablet 4 mg (4 mg Oral Given 04/18/16 1051)  lidocaine (PF) (XYLOCAINE) 1 % injection (0.9 mLs  Given 04/18/16 1055)     Initial Impression / Assessment and Plan / ED Course  I have reviewed the triage vital signs and the nursing notes.  Pertinent  labs & imaging results that were available during my care of the patient were reviewed by me and considered in my medical decision making (see chart for details).  Clinical Course  Value Comment By Time  Robynn Pane Prep: (!) PRESENT GC/Chlamydia, HIV, RPR collected and pending.  Given cervical discharge as well, patient treated with Flagyl, Azithromycin, and Rocephin. Cheri Fowler, PA-C 07/23 1117  Nitrite: (!) POSITIVE Urine culture sent.  Likely infectious.  No signs of pyelonephritis on H/P.  Home with Kelfex.  Cheri Fowler, PA-C 07/23 1118  Hemoglobin: (!) 10.5 Stable, appears baseline. No bleeding.  Cheri Fowler, PA-C 07/23 1146   Patient presents with vaginal discharge and dysuria. VSS, NAD.  No fever, chills, nausea, vomiting, or flank pain. On exam, patient appears well, nontoxic or septic. No CVA tenderness. No abdominal tenderness. She does have moderate greenish white cervical discharge. No CMT or adnexal tenderness. No white count.  Low suspicion for PID or TOA. Nonpregnant. Wet prep remarkable for trichomoniasis area UA appears infectious. Patient treated in the ED with Flagyl, azithromycin, and Rocephin. Will discharge home with Keflex. Patient instructed to abstain from sexual intercourse. Patient is to inform all sexual partners. Return precautions discussed. Patient agrees and acknowledges the above plan for discharge.  Case has been discussed with Dr. Jacqulyn Bath who agrees with the above plan for discharge.    Final Clinical Impressions(s) / ED Diagnoses   Final diagnoses:  UTI (lower urinary tract infection)  Trichomoniasis  Concern about STD in female without diagnosis    New Prescriptions New Prescriptions   CEPHALEXIN (KEFLEX) 500 MG CAPSULE    Take 1 capsule (500 mg total) by mouth 2 (two) times daily.         Cheri Fowler, PA-C 04/18/16 1146    Maia Plan, MD 04/18/16 [phone]

## 2016-04-18 NOTE — ED Triage Notes (Signed)
Patient complains of vaginal discharge with dysuria and frequency since Thursday.

## 2016-04-18 NOTE — Discharge Instructions (Signed)
Your test results will be available in the next couple of days. If they are positive, you will receive a phone call. You have already been treated for STDs, so you do not need to return for treatment. Please inform all partners. Do not have sex for the next 7 days, until your symptoms resolve, and until all partners have been treated and tested. ° °

## 2016-04-18 NOTE — ED Notes (Signed)
Report given to Gwenith Spitz, RN

## 2016-04-19 LAB — URINE CULTURE

## 2016-04-19 LAB — GC/CHLAMYDIA PROBE AMP (~~LOC~~) NOT AT ARMC
CHLAMYDIA, DNA PROBE: NEGATIVE
NEISSERIA GONORRHEA: NEGATIVE

## 2016-04-19 LAB — HIV ANTIBODY (ROUTINE TESTING W REFLEX): HIV Screen 4th Generation wRfx: NONREACTIVE

## 2016-04-19 LAB — RPR: RPR Ser Ql: NONREACTIVE

## 2016-10-08 ENCOUNTER — Emergency Department (HOSPITAL_COMMUNITY): Admission: EM | Admit: 2016-10-08 | Discharge: 2016-10-08 | Discharge status: Against Medical Advice | Payer: Self-pay

## 2016-10-08 NOTE — ED Notes (Signed)
Pt came up, asked registration to cut his bracelet off.  Did not speak to RN.  Bracelet provided to RN for d/c

## 2016-10-18 ENCOUNTER — Ambulatory Visit (HOSPITAL_COMMUNITY)
Admission: EM | Admit: 2016-10-18 | Discharge: 2016-10-18 | Disposition: A | Discharge status: Home / Self Care | Payer: Self-pay | Attending: Family Medicine | Admitting: Family Medicine

## 2016-10-18 ENCOUNTER — Encounter (HOSPITAL_COMMUNITY): Payer: Self-pay | Admitting: Family Medicine

## 2016-10-18 DIAGNOSIS — N898 Other specified noninflammatory disorders of vagina: Secondary | ICD-10-CM

## 2016-10-18 DIAGNOSIS — B9689 Other specified bacterial agents as the cause of diseases classified elsewhere: Secondary | ICD-10-CM | POA: Insufficient documentation

## 2016-10-18 DIAGNOSIS — N760 Acute vaginitis: Secondary | ICD-10-CM | POA: Insufficient documentation

## 2016-10-18 MED ORDER — METRONIDAZOLE 500 MG PO TABS: 500.0000 mg | ORAL_TABLET | Freq: Two times a day (BID) | ORAL | 0 refills | Status: DC

## 2016-10-18 NOTE — ED Triage Notes (Signed)
Pt here for vaginal discharge a yellow/brownish with foul odor.

## 2016-10-18 NOTE — ED Provider Notes (Signed)
CSN: [id number]     Arrival date & time 10/18/16  1140 History   None    Chief Complaint  Patient presents with  . Vaginal Discharge   (Consider location/radiation/quality/duration/timing/severity/associated sxs/prior Treatment) Patient c/o yellow vaginal DC with odor.     The history is provided by the patient.  Vaginal Discharge  Quality:  Yellow Severity:  Mild Onset quality:  Sudden Timing:  Constant Progression:  Worsening Relieved by:  Nothing Worsened by:  Nothing Ineffective treatments:  None tried   Past Medical History:  Diagnosis Date  . Herpes 2002  . Infection   . Trichimoniasis [id number]   Past Surgical History:  Procedure Laterality Date  . DILATION AND CURETTAGE OF UTERUS    . LAPAROSCOPY  11/27/2011   Procedure: LAPAROSCOPY OPERATIVE;  Surgeon: Catalina AntiguaPeggy Constant, MD;  Location: [facility];  Service: Gynecology;  Laterality: Bilateral;  . SALPINGECTOMY     Bilateral for twin ectopic preg.   . TUBAL LIGATION  12-05-2010   Family History  Problem Relation Age of Onset  . Anesthesia problems Neg Hx    Social History  Substance Use Topics  . Smoking status: Never Smoker  . Smokeless tobacco: Never Used  . Alcohol use No   OB History    Gravida Para Term Preterm AB Living   7 5     2 5    SAB TAB Ectopic Multiple Live Births     1 1   1      Review of Systems  Constitutional: Negative.   HENT: Negative.   Eyes: Negative.   Respiratory: Negative.   Cardiovascular: Negative.   Gastrointestinal: Negative.   Endocrine: Negative.   Genitourinary: Positive for vaginal discharge.  Musculoskeletal: Negative.   Allergic/Immunologic: Negative.   Neurological: Negative.   Hematological: Negative.     Allergies  Patient has no known allergies.  Home Medications   Prior to Admission medications   Medication Sig Start Date End Date Taking? Authorizing Provider  metroNIDAZOLE (FLAGYL) 500 MG tablet Take 1 tablet (500 mg total) by mouth 2 (two) times daily.  10/18/16   Deatra CanterWilliam J Ridgely Anastacio, FNP   Meds Ordered and Administered this Visit  Medications - No data to display  BP (!) 114/41   Pulse 82   Temp 98.4 F (36.9 C)   Resp 18   LMP 10/13/2016   SpO2 100%  No data found.   Physical Exam  Constitutional: She appears well-developed and well-nourished.  HENT:  Head: Normocephalic and atraumatic.  Right Ear: External ear normal.  Left Ear: External ear normal.  Mouth/Throat: Oropharynx is clear and moist.  Eyes: Conjunctivae and EOM are normal. Pupils are equal, round, and reactive to light.  Neck: Normal range of motion. Neck supple.  Cardiovascular: Normal rate, regular rhythm and normal heart sounds.   Pulmonary/Chest: Effort normal and breath sounds normal.  Nursing note and vitals reviewed.   Urgent Care Course     Procedures (including critical care time)  Labs Review Labs Reviewed  URINE CYTOLOGY ANCILLARY ONLY    Imaging Review No results found.   Visual Acuity Review  Right Eye Distance:   Left Eye Distance:   Bilateral Distance:    Right Eye Near:   Left Eye Near:    Bilateral Near:         MDM   1. Vaginal discharge   2. BV (bacterial vaginosis)    Flagyl 500mg  one po bid x 7 days #14  Urine Cytology GC / Chlamydia Trich  Deatra Canter, FNP 10/18/16 [phone]

## 2016-10-19 LAB — URINE CYTOLOGY ANCILLARY ONLY
Chlamydia: NEGATIVE
Neisseria Gonorrhea: NEGATIVE
Trichomonas: POSITIVE — AB

## 2017-04-03 ENCOUNTER — Ambulatory Visit (HOSPITAL_COMMUNITY)
Admission: EM | Admit: 2017-04-03 | Discharge: 2017-04-03 | Disposition: A | Discharge status: Home / Self Care | Payer: Self-pay | Attending: Family Medicine | Admitting: Family Medicine

## 2017-04-03 ENCOUNTER — Encounter (HOSPITAL_COMMUNITY): Payer: Self-pay | Admitting: Emergency Medicine

## 2017-04-03 DIAGNOSIS — B373 Candidiasis of vulva and vagina: Secondary | ICD-10-CM

## 2017-04-03 DIAGNOSIS — N760 Acute vaginitis: Secondary | ICD-10-CM | POA: Insufficient documentation

## 2017-04-03 DIAGNOSIS — B9689 Other specified bacterial agents as the cause of diseases classified elsewhere: Secondary | ICD-10-CM | POA: Insufficient documentation

## 2017-04-03 DIAGNOSIS — B3731 Acute candidiasis of vulva and vagina: Secondary | ICD-10-CM

## 2017-04-03 LAB — POCT URINALYSIS DIP (DEVICE)
Bilirubin Urine: NEGATIVE
Glucose, UA: NEGATIVE mg/dL
Ketones, ur: NEGATIVE mg/dL
Leukocytes, UA: NEGATIVE
NITRITE: NEGATIVE
PH: 6 (ref 5.0–8.0)
PROTEIN: NEGATIVE mg/dL
Specific Gravity, Urine: 1.02 (ref 1.005–1.030)
UROBILINOGEN UA: 0.2 mg/dL (ref 0.0–1.0)

## 2017-04-03 MED ORDER — FLUCONAZOLE 150 MG PO TABS: 150.0000 mg | ORAL_TABLET | Freq: Every day | ORAL | 0 refills | Status: DC

## 2017-04-03 MED ORDER — METRONIDAZOLE 500 MG PO TABS: 500.0000 mg | ORAL_TABLET | Freq: Two times a day (BID) | ORAL | 0 refills | Status: DC

## 2017-04-03 NOTE — ED Triage Notes (Signed)
Vaginal itching and odor.  This episode was noticed on Friday.  Patient does not think she completely was treated for trich at last visit

## 2017-04-03 NOTE — ED Notes (Signed)
Clean and dirty urine specimens obtained, specimens are in lab

## 2017-04-03 NOTE — Discharge Instructions (Signed)
DO NOT DRINK ALCOHOL ON FLAGYL!!!  If anything changes with the urine you submit today, we will reach out to you.  Seek care if new symptoms arise.  Practice safe sex habits.  Call if any questions.

## 2017-04-03 NOTE — ED Notes (Signed)
Patient sent for a dirty and clean urine with instructions

## 2017-04-03 NOTE — ED Provider Notes (Signed)
[facility]    CSN: [id number] Arrival date & time: 04/03/17  1448   History   Chief Complaint Chief Complaint  Patient presents with  . Vaginal Itching    HPI Theresa Mooney is a 36 y.o. female.   HPI  Pt here for 2 days of vaginal discharge and itching. She has a hx of BV and trich. She states that the odor of the discharge feels similar, but the itching is more like a yeast infection. The patient describes the vaginal discharge as whitish and thin. She is also having some light abdominal pain. She is sexually active, her partner has not voiced any concerns to her.  She is not having any pain with urination, urinary discharge, blood from vagina or in urine, fevers. She has had a tubal ligation. She does not have an IUD.  Past Medical History:  Diagnosis Date  . Herpes 2002  . Infection   . Trichimoniasis [phone]    Patient Active Problem List   Diagnosis Date Noted  . h/o postpartum BTL  12/13/2011  . S/P ectopic pregnancy 12/13/2011    Past Surgical History:  Procedure Laterality Date  . DILATION AND CURETTAGE OF UTERUS    . LAPAROSCOPY  11/27/2011   Procedure: LAPAROSCOPY OPERATIVE;  Surgeon: Catalina Antigua, MD;  Location: [facility];  Service: Gynecology;  Laterality: Bilateral;  . SALPINGECTOMY     Bilateral for twin ectopic preg.   . TUBAL LIGATION  12-05-2010    OB History    Gravida Para Term Preterm AB Living   7 5     2 5    SAB TAB Ectopic Multiple Live Births     1 1   1        Home Medications    Prior to Admission medications   Medication Sig Start Date End Date Taking? Authorizing Provider  fluconazole (DIFLUCAN) 150 MG tablet Take 1 tablet (150 mg total) by mouth daily. 04/03/17   Sharlene Dory, DO  metroNIDAZOLE (FLAGYL) 500 MG tablet Take 1 tablet (500 mg total) by mouth 2 (two) times daily. 04/03/17   Sharlene Dory, DO    Family History Family History  Problem Relation Age of Onset  . Anesthesia problems Neg Hx      Social History Social History  Substance Use Topics  . Smoking status: Never Smoker  . Smokeless tobacco: Never Used  . Alcohol use No     Allergies   Patient has no known allergies.   Review of Systems Review of Systems  Constitutional: Negative for fever.  Genitourinary: Positive for vaginal discharge. Negative for dysuria, frequency, hematuria, urgency and vaginal bleeding.     Physical Exam Triage Vital Signs ED Triage Vitals [04/03/17 1533]  Enc Vitals Group     BP 109/75     Pulse Rate 77     Resp 18     Temp 98 F (36.7 C)     Temp Source Oral     SpO2 97 %   Updated Vital Signs BP 109/75 (BP Location: Left Arm) Comment (BP Location): large cuff  Pulse 77   Temp 98 F (36.7 C) (Oral)   Resp 18   SpO2 97%   Physical Exam  Constitutional: She is oriented to person, place, and time. She appears well-developed and well-nourished.  HENT:  Head: Normocephalic and atraumatic.  Mouth/Throat: Oropharynx is clear and moist.  Neck: Normal range of motion. Neck supple.  Cardiovascular: Normal rate and regular rhythm.  No murmur heard. Pulmonary/Chest: Effort normal and breath sounds normal. No respiratory distress.  Abdominal: Soft. Bowel sounds are normal. She exhibits no distension and no mass. There is no guarding.  Mild umbilical TTP  Neurological: She is alert and oriented to person, place, and time.  Skin: Skin is warm and dry.  Psychiatric: She has a normal mood and affect. Judgment normal.     UC Treatments / Results  Labs (all labs ordered are listed, but only abnormal results are displayed) Labs Reviewed  POCT URINALYSIS DIP (DEVICE) - Abnormal; Notable for the following:       Result Value   Hgb urine dipstick MODERATE (*)    All other components within normal limits  URINE CYTOLOGY ANCILLARY ONLY    Procedures Procedures none  Initial Impression / Assessment and Plan / UC Course  I have reviewed the triage vital signs and the  nursing notes.  Pertinent labs & imaging results that were available during my care of the patient were reviewed by me and considered in my medical decision making (see chart for details).   Pt presents with vaginal discharge and pruritis. Will obtain a urine sample to check for GC/chlamydia, BV, yeast and trich. Given her hx with some of the aforementioned, will empirically treat for yeast and trich/BV. She is strongly encouraged to avoid alcohol while taking Flagyl. She assured me she is not pregnant and has had a tubal ligation. No signs/symptoms concerning for GC/chlamydia at this time. Will hold off on empiric treatment for that. She is to f/u with PCP if symptoms fail to improve and her urine ancillary study is unremarkable. The patient is to be discharged in stable condition. The patient voiced understanding and agreement to the plan.   Final Clinical Impressions(s) / UC Diagnoses   Final diagnoses:  Bacterial vaginosis  Vagina, candidiasis    New Prescriptions New Prescriptions   FLUCONAZOLE (DIFLUCAN) 150 MG TABLET    Take 1 tablet (150 mg total) by mouth daily.   METRONIDAZOLE (FLAGYL) 500 MG TABLET    Take 1 tablet (500 mg total) by mouth 2 (two) times daily.     Sharlene DoryWendling, Stephanie Littman Paul, [place]DO 04/03/17 [id number]

## 2017-04-04 LAB — URINE CYTOLOGY ANCILLARY ONLY
CHLAMYDIA, DNA PROBE: NEGATIVE
Neisseria Gonorrhea: NEGATIVE
Trichomonas: NEGATIVE

## 2017-04-06 LAB — URINE CYTOLOGY ANCILLARY ONLY: CANDIDA VAGINITIS: NEGATIVE

## 2017-05-12 ENCOUNTER — Encounter (HOSPITAL_COMMUNITY): Payer: Self-pay | Admitting: *Deleted

## 2017-05-12 ENCOUNTER — Inpatient Hospital Stay (HOSPITAL_COMMUNITY)
Admission: AD | Admit: 2017-05-12 | Discharge: 2017-05-12 | Disposition: A | Discharge status: Home / Self Care | Payer: Self-pay | Source: Ambulatory Visit | Attending: Family Medicine | Admitting: Family Medicine

## 2017-05-12 DIAGNOSIS — N760 Acute vaginitis: Secondary | ICD-10-CM | POA: Insufficient documentation

## 2017-05-12 DIAGNOSIS — Z9851 Tubal ligation status: Secondary | ICD-10-CM | POA: Insufficient documentation

## 2017-05-12 DIAGNOSIS — R3129 Other microscopic hematuria: Secondary | ICD-10-CM | POA: Insufficient documentation

## 2017-05-12 DIAGNOSIS — D649 Anemia, unspecified: Secondary | ICD-10-CM | POA: Insufficient documentation

## 2017-05-12 DIAGNOSIS — B9689 Other specified bacterial agents as the cause of diseases classified elsewhere: Secondary | ICD-10-CM | POA: Insufficient documentation

## 2017-05-12 DIAGNOSIS — R1084 Generalized abdominal pain: Secondary | ICD-10-CM | POA: Insufficient documentation

## 2017-05-12 LAB — CBC
HCT: 33 % — ABNORMAL LOW (ref 36.0–46.0)
HEMOGLOBIN: 10.9 g/dL — AB (ref 12.0–15.0)
MCH: 28.5 pg (ref 26.0–34.0)
MCHC: 33 g/dL (ref 30.0–36.0)
MCV: 86.2 fL (ref 78.0–100.0)
Platelets: 277 10*3/uL (ref 150–400)
RBC: 3.83 MIL/uL — ABNORMAL LOW (ref 3.87–5.11)
RDW: 14.4 % (ref 11.5–15.5)
WBC: 5.1 10*3/uL (ref 4.0–10.5)

## 2017-05-12 LAB — URINALYSIS, ROUTINE W REFLEX MICROSCOPIC
BILIRUBIN URINE: NEGATIVE
Bacteria, UA: NONE SEEN
GLUCOSE, UA: NEGATIVE mg/dL
KETONES UR: NEGATIVE mg/dL
LEUKOCYTES UA: NEGATIVE
Nitrite: NEGATIVE
PH: 7 (ref 5.0–8.0)
Protein, ur: NEGATIVE mg/dL
Specific Gravity, Urine: 1.015 (ref 1.005–1.030)

## 2017-05-12 LAB — COMPREHENSIVE METABOLIC PANEL
ALBUMIN: 3.5 g/dL (ref 3.5–5.0)
ALK PHOS: 89 U/L (ref 38–126)
ALT: 16 U/L (ref 14–54)
AST: 19 U/L (ref 15–41)
Anion gap: 8 (ref 5–15)
BILIRUBIN TOTAL: 0.7 mg/dL (ref 0.3–1.2)
BUN: 13 mg/dL (ref 6–20)
CO2: 26 mmol/L (ref 22–32)
Calcium: 8.6 mg/dL — ABNORMAL LOW (ref 8.9–10.3)
Chloride: 103 mmol/L (ref 101–111)
Creatinine, Ser: 0.82 mg/dL (ref 0.44–1.00)
GFR calc Af Amer: >60 mL/min (ref >60)
GFR calc non Af Amer: >60 mL/min (ref >60)
GLUCOSE: 95 mg/dL (ref 65–99)
POTASSIUM: 4 mmol/L (ref 3.5–5.1)
Sodium: 137 mmol/L (ref 135–145)
TOTAL PROTEIN: 7.4 g/dL (ref 6.5–8.1)

## 2017-05-12 LAB — WET PREP, GENITAL
SPERM: NONE SEEN
Trich, Wet Prep: NONE SEEN
Yeast Wet Prep HPF POC: NONE SEEN

## 2017-05-12 LAB — LIPASE, BLOOD: LIPASE: 23 U/L (ref 11–51)

## 2017-05-12 LAB — POCT PREGNANCY, URINE: Preg Test, Ur: NEGATIVE

## 2017-05-12 MED ORDER — METRONIDAZOLE 500 MG PO TABS: 500.0000 mg | ORAL_TABLET | Freq: Two times a day (BID) | ORAL | 0 refills | Status: DC

## 2017-05-12 MED ORDER — OMEPRAZOLE 20 MG PO CPDR: 20.0000 mg | DELAYED_RELEASE_CAPSULE | Freq: Every day | ORAL | 1 refills | Status: DC

## 2017-05-12 MED ORDER — FLUCONAZOLE 150 MG PO TABS: 150.0000 mg | ORAL_TABLET | Freq: Every day | ORAL | 0 refills | Status: DC

## 2017-05-12 NOTE — MAU Note (Addendum)
Past few wks, stomach has been bothering her.  Period was to have come on  Last wk, never did.  End of July was dx with BV and yeast at urgent.  Took meds, stomach continues to hurt-upper abd, been dizzy, having headaches. Nauseated.  Breasts are sore, pain goes all the way around to back.Marland Kitchen. Hx of ruptured ectopic with emergency surgery. (this was following a tubal ligation, so bilateral salpingectomy per MD note)  4 preg tests, all were neg.

## 2017-05-12 NOTE — Discharge Instructions (Signed)
Establish care with a Primary Care Providers as soon as possible to follow up on your abdominal pain, anemia and microscopic hematuria (blood in urine).  [facility] Address: [address], [place], [place] [address] Phone: [phone]  [facility] Address: [address] [place], [place], [place] [address] Phone: [phone]

## 2017-05-12 NOTE — MAU Provider Note (Signed)
History     CSN: [id number] Arrival date and time: 05/12/17 1133    Chief Complaint  Patient presents with  . Abdominal Pain  . Back Pain  . Nausea  . Headache  . Dizziness  . Breast Pain  . Possible Pregnancy    HPI: Theresa Mooney is a 36 y.o. [phone] who presents to maternity admissions reporting multiple complaints.  She reports stomach pain x 2 weeks, mid-abdomen, with some radiation to her back. Thought this was related to BV, for which she was treated last a month ago. Reports pain worse before eating, and also after drinking lemonade. Pain better with eating and after having a BM. Some nausea, but no vomiting. Denies diarrhea or constipation. Reports regular soft BMs daily. Denies melena or hematochezia. Denies hematuria, or dysuria but endorses urinary frequency.   Also reports menses is late and she feels like are breasts are sore like when she is about to her her period.  She is s/pt BTL and has h/o ectopic pregnancy s/p BTL.    Past obstetric history: OB History  Gravida Para Term Preterm AB Living  7 5     2 5   SAB TAB Ectopic Multiple Live Births    1 1   1     # Outcome Date GA Lbr Len/2nd Weight Sex Delivery Anes PTL Lv  7 Para           6 Para           5 Para           4 Para           3 Para         LIV  2 TAB           1 Ectopic               Past Medical History:  Diagnosis Date  . Herpes 2002  . Infection   . Trichimoniasis [id number]    Past Surgical History:  Procedure Laterality Date  . DILATION AND CURETTAGE OF UTERUS    . LAPAROSCOPY  11/27/2011   Procedure: LAPAROSCOPY OPERATIVE;  Surgeon: Catalina Antigua, MD;  Location: [facility];  Service: Gynecology;  Laterality: Bilateral;  . SALPINGECTOMY     Bilateral for twin ectopic preg.   . TUBAL LIGATION  12-05-2010    Family History  Problem Relation Age of Onset  . Anesthesia problems Neg Hx     Social History  Substance Use Topics  . Smoking status: Never Smoker  . Smokeless tobacco:  Never Used  . Alcohol use No    Allergies: No Known Allergies  Prescriptions Prior to Admission  Medication Sig Dispense Refill Last Dose  . fluconazole (DIFLUCAN) 150 MG tablet Take 1 tablet (150 mg total) by mouth daily. 1 tablet 0   . metroNIDAZOLE (FLAGYL) 500 MG tablet Take 1 tablet (500 mg total) by mouth 2 (two) times daily. 14 tablet 0     Review of Systems - Negative except for what is mentioned in HPI.  Physical Exam   Blood pressure 114/75, pulse 79, temperature 99.2 F (37.3 C), temperature source Oral, resp. rate 18, weight 207 lb 12 oz (94.2 kg), last menstrual period 03/22/2017, SpO2 97 %.  Constitutional: Well-developed, well-nourished female in no acute distress.  HEENT: [place]/AT. Normal conjunctivae. No scleral icterus. Normal oral mucosa.  Cardiovascular: normal rate, regular rhythm, no murmurs Respiratory: normal effort, lungs CTAB. GI: Abd soft, +BS. Mild epigastric and LUQ  TTP; nontender on other quadrants. No rebound or guarding. GU: Neg CVAT. Pelvic: NEFG, thin white discharge, no blood; No CMT, no adnexal tenderness or masses   MSK: Extremities nontender, no edema, normal ROM Neurologic: Alert and oriented x 4. Psych: Normal mood and affect    MAU Course  Procedures  MDM Physical exam reassuring. History most c/w with GI etiology of patient's abdominal pain. Labs ordered.  Results for orders placed or performed during the hospital encounter of 05/12/17  Wet prep, genital  Result Value Ref Range   Yeast Wet Prep HPF POC NONE SEEN NONE SEEN   Trich, Wet Prep NONE SEEN NONE SEEN   Clue Cells Wet Prep HPF POC PRESENT (A) NONE SEEN   WBC, Wet Prep HPF POC FEW (A) NONE SEEN   Sperm NONE SEEN   Urinalysis, Routine w reflex microscopic  Result Value Ref Range   Color, Urine YELLOW YELLOW   APPearance CLEAR CLEAR   Specific Gravity, Urine 1.015 1.005 - 1.030   pH 7.0 5.0 - 8.0   Glucose, UA NEGATIVE NEGATIVE mg/dL   Hgb urine dipstick MODERATE (A)  NEGATIVE   Bilirubin Urine NEGATIVE NEGATIVE   Ketones, ur NEGATIVE NEGATIVE mg/dL   Protein, ur NEGATIVE NEGATIVE mg/dL   Nitrite NEGATIVE NEGATIVE   Leukocytes, UA NEGATIVE NEGATIVE   RBC / HPF 6-30 0 - 5 RBC/hpf   WBC, UA 0-5 0 - 5 WBC/hpf   Bacteria, UA NONE SEEN NONE SEEN   Squamous Epithelial / LPF 0-5 (A) NONE SEEN   Mucous PRESENT   CBC  Result Value Ref Range   WBC 5.1 4.0 - 10.5 K/uL   RBC 3.83 (L) 3.87 - 5.11 MIL/uL   Hemoglobin 10.9 (L) 12.0 - 15.0 g/dL   HCT 16.133.0 (L) 09.636.0 - 04.546.0 %   MCV 86.2 78.0 - 100.0 fL   MCH 28.5 26.0 - 34.0 pg   MCHC 33.0 30.0 - 36.0 g/dL   RDW 40.914.4 81.111.5 - 91.415.5 %   Platelets 277 150 - 400 K/uL  Comprehensive metabolic panel  Result Value Ref Range   Sodium 137 135 - 145 mmol/L   Potassium 4.0 3.5 - 5.1 mmol/L   Chloride 103 101 - 111 mmol/L   CO2 26 22 - 32 mmol/L   Glucose, Bld 95 65 - 99 mg/dL   BUN 13 6 - 20 mg/dL   Creatinine, Ser 7.820.82 0.44 - 1.00 mg/dL   Calcium 8.6 (L) 8.9 - 10.3 mg/dL   Total Protein 7.4 6.5 - 8.1 g/dL   Albumin 3.5 3.5 - 5.0 g/dL   AST 19 15 - 41 U/L   ALT 16 14 - 54 U/L   Alkaline Phosphatase 89 38 - 126 U/L   Total Bilirubin 0.7 0.3 - 1.2 mg/dL   GFR calc non Af Amer >60 >60 mL/min   GFR calc Af Amer >60 >60 mL/min   Anion gap 8 5 - 15  Lipase, blood  Result Value Ref Range   Lipase 23 11 - 51 U/L  Pregnancy, urine POC  Result Value Ref Range   Preg Test, Ur NEGATIVE NEGATIVE   - Wet prep c/w BV. - Urine shows microscopic hematuria (on EHR review several UA with microscopic hematuria since 2009). - CBC, CMP, lipase wnl other than anemia (Hb 10.9) - pt endorses some menorrhagia; no melena or hematochezia.   Assessment and Plan  Assessment: 1. Generalized abdominal pain   2. Bacterial vaginosis   3. Anemia, unspecified type  4. Microscopic hematuria     Plan: 1. Abdominal pain: likely GI etiology. Given microscopic hematuria and ill-defined location of pain, could kidney stone are in ddx, but  less likely given that microscopic hematuria is noted on UA since 2009, and pain pattern is less characteristic of this.  --Start trial of PPI. --Emphasized importance of outpatient follow with a PCP (list of providers given)  2. BV --Rx for metronidazole  3. Anemia --Discussed with patient importance of follow up with PCP for further work up. Need to r/o occult GI bleed  4. Microscopic hematuria:  --Urine sent for culture to r/o cystitis, but less likely given that this is chronic --Emphasized importance of follow up with a PCP for further work up.    Lulani Bour, Kandra Nicolas, MD 05/12/2017 1:41 PM

## 2017-05-13 LAB — URINE CULTURE

## 2017-05-13 LAB — GC/CHLAMYDIA PROBE AMP (~~LOC~~) NOT AT ARMC
Chlamydia: NEGATIVE
NEISSERIA GONORRHEA: NEGATIVE

## 2017-05-15 ENCOUNTER — Emergency Department (HOSPITAL_COMMUNITY)
Admission: EM | Admit: 2017-05-15 | Discharge: 2017-05-16 | Disposition: A | Discharge status: Home / Self Care | Payer: Self-pay | Attending: Emergency Medicine | Admitting: Emergency Medicine

## 2017-05-15 ENCOUNTER — Encounter (HOSPITAL_COMMUNITY): Payer: Self-pay | Admitting: Emergency Medicine

## 2017-05-15 DIAGNOSIS — R1013 Epigastric pain: Secondary | ICD-10-CM | POA: Insufficient documentation

## 2017-05-15 LAB — CBC
HEMATOCRIT: 36.8 % (ref 36.0–46.0)
HEMOGLOBIN: 12.1 g/dL (ref 12.0–15.0)
MCH: 28 pg (ref 26.0–34.0)
MCHC: 32.9 g/dL (ref 30.0–36.0)
MCV: 85.2 fL (ref 78.0–100.0)
Platelets: 319 10*3/uL (ref 150–400)
RBC: 4.32 MIL/uL (ref 3.87–5.11)
RDW: 14 % (ref 11.5–15.5)
WBC: 7.3 10*3/uL (ref 4.0–10.5)

## 2017-05-15 LAB — COMPREHENSIVE METABOLIC PANEL
ALBUMIN: 3.8 g/dL (ref 3.5–5.0)
ALK PHOS: 92 U/L (ref 38–126)
ALT: 15 U/L (ref 14–54)
ANION GAP: 8 (ref 5–15)
AST: 16 U/L (ref 15–41)
BILIRUBIN TOTAL: 0.6 mg/dL (ref 0.3–1.2)
BUN: 16 mg/dL (ref 6–20)
CALCIUM: 9.3 mg/dL (ref 8.9–10.3)
CO2: 26 mmol/L (ref 22–32)
Chloride: 103 mmol/L (ref 101–111)
Creatinine, Ser: 1.03 mg/dL — ABNORMAL HIGH (ref 0.44–1.00)
GFR calc Af Amer: >60 mL/min (ref >60)
GFR calc non Af Amer: >60 mL/min (ref >60)
GLUCOSE: 97 mg/dL (ref 65–99)
POTASSIUM: 3.7 mmol/L (ref 3.5–5.1)
SODIUM: 137 mmol/L (ref 135–145)
Total Protein: 8.1 g/dL (ref 6.5–8.1)

## 2017-05-15 LAB — URINALYSIS, ROUTINE W REFLEX MICROSCOPIC
BACTERIA UA: NONE SEEN
Bilirubin Urine: NEGATIVE
Glucose, UA: NEGATIVE mg/dL
Ketones, ur: 20 mg/dL — AB
Nitrite: NEGATIVE
PROTEIN: NEGATIVE mg/dL
SPECIFIC GRAVITY, URINE: 1.024 (ref 1.005–1.030)
pH: 5 (ref 5.0–8.0)

## 2017-05-15 LAB — I-STAT BETA HCG BLOOD, ED (MC, WL, AP ONLY)

## 2017-05-15 LAB — LIPASE, BLOOD: Lipase: 25 U/L (ref 11–51)

## 2017-05-15 MED ORDER — SUCRALFATE 1 G PO TABS
1.0000 g | ORAL_TABLET | Freq: Once | ORAL | Status: AC
  Administered 2017-05-15: 1 g via ORAL
  Filled 2017-05-15: qty 1

## 2017-05-15 NOTE — ED Provider Notes (Signed)
[facility]-EMERGENCY DEPT Provider Note   CSN: [id number] Arrival date & time: 05/15/17  1941     History   Chief Complaint Chief Complaint  Patient presents with  . Abdominal Pain    HPI Theresa Mooney is a 36 y.o. female.  The history is provided by the patient and medical records. No language interpreter was used.  Abdominal Pain   Pertinent negatives include diarrhea, nausea, vomiting, constipation and dysuria.   Theresa Mooney is a 36 y.o. female who presents to the Emergency Department complaining of epigastric abdominal pain x 4 days. Pain described as an ache. Will come and go. Last night she ate pizza which made it feel much worse. She was seen at [facility]U on 8/16 where it was thought this was GI related and started on PPI which she has been taking each morning before breakfast. At MAU, she had pelvic exam performed as well where clue cells were present. She has been on Flagyl without missed doses also. Initially had vaginal discharge but this has now resolved. No lower abdominal pain but does have some intermittent aching low back pain. No dysuria, urinary urgency/frequency, n/v/d, constipation, vaginal bleeding, fever, chills, chest pain or trouble breathing.   Past Medical History:  Diagnosis Date  . Herpes 2002  . Infection   . Trichimoniasis [id number]    Patient Active Problem List   Diagnosis Date Noted  . h/o postpartum BTL  12/13/2011  . S/P ectopic pregnancy 12/13/2011    Past Surgical History:  Procedure Laterality Date  . DILATION AND CURETTAGE OF UTERUS    . LAPAROSCOPY  11/27/2011   Procedure: LAPAROSCOPY OPERATIVE;  Surgeon: Catalina Antigua, MD;  Location: [facility];  Service: Gynecology;  Laterality: Bilateral;  . SALPINGECTOMY     Bilateral for twin ectopic preg.   . TUBAL LIGATION  12-05-2010    OB History    Gravida Para Term Preterm AB Living   7 5     2 5    SAB TAB Ectopic Multiple Live Births     1 1   1        Home Medications    Prior to Admission  medications   Medication Sig Start Date End Date Taking? Authorizing Provider  metroNIDAZOLE (FLAGYL) 500 MG tablet Take 1 tablet (500 mg total) by mouth 2 (two) times daily. 05/12/17  Yes Degele, Kandra Nicolas, MD  omeprazole (PRILOSEC) 20 MG capsule Take 1 capsule (20 mg total) by mouth daily. 30 minutes before breakfast 05/12/17 05/12/18 Yes Degele, Kandra Nicolas, MD  fluconazole (DIFLUCAN) 150 MG tablet Take 1 tablet (150 mg total) by mouth daily. 05/12/17   Degele, Kandra Nicolas, MD  sucralfate (CARAFATE) 1 g tablet Take 1 tablet (1 g total) by mouth 4 (four) times daily -  with meals and at bedtime. 05/16/17   Makyi Ledo, Chase Picket, PA-C    Family History Family History  Problem Relation Age of Onset  . Anesthesia problems Neg Hx     Social History Social History  Substance Use Topics  . Smoking status: Never Smoker  . Smokeless tobacco: Never Used  . Alcohol use No     Allergies   Patient has no known allergies.   Review of Systems Review of Systems  Gastrointestinal: Positive for abdominal pain. Negative for blood in stool, constipation, diarrhea, nausea and vomiting.  Genitourinary: Positive for vaginal discharge (Resolved). Negative for dysuria, flank pain and urgency.  Musculoskeletal: Positive for back pain.     Physical Exam Updated  Vital Signs BP 119/80 (BP Location: Right Arm)   Pulse 78   Temp 98.6 F (37 C) (Oral)   Resp 16   Wt 95.3 kg (210 lb)   SpO2 100%   BMI 42.41 kg/m   Physical Exam  Constitutional: She is oriented to person, place, and time. She appears well-developed and well-nourished. No distress.  Well appearing.  HENT:  Head: Normocephalic and atraumatic.  Cardiovascular: Normal rate, regular rhythm and normal heart sounds.   No murmur Mooney. Pulmonary/Chest: Effort normal and breath sounds normal. No respiratory distress. She has no wheezes. She has no rales. She exhibits no tenderness.  Abdominal: Soft. Bowel sounds are normal. She exhibits no distension.   Minimal epigastric abdominal tenderness with no rebound or guarding. Negative Murphy's. No CVA tenderness.   Musculoskeletal:  No C/T/L spine or paraspinal tenderness.  Neurological: She is alert and oriented to person, place, and time.  Skin: Skin is warm and dry.  Nursing note and vitals reviewed.    ED Treatments / Results  Labs (all labs ordered are listed, but only abnormal results are displayed) Labs Reviewed  COMPREHENSIVE METABOLIC PANEL - Abnormal; Notable for the following:       Result Value   Creatinine, Ser 1.03 (*)    All other components within normal limits  URINALYSIS, ROUTINE W REFLEX MICROSCOPIC - Abnormal; Notable for the following:    APPearance HAZY (*)    Hgb urine dipstick LARGE (*)    Ketones, ur 20 (*)    Leukocytes, UA MODERATE (*)    Squamous Epithelial / LPF 6-30 (*)    All other components within normal limits  LIPASE, BLOOD  CBC  I-STAT BETA HCG BLOOD, ED ([facility], [facility], [facility] ONLY)    EKG  EKG Interpretation None       Radiology No results found.  Procedures Procedures (including critical care time)  Medications Ordered in ED Medications  sucralfate (CARAFATE) tablet 1 g (1 g Oral Given 05/15/17 2232)     Initial Impression / Assessment and Plan / ED Course  I have reviewed the triage vital signs and the nursing notes.  Pertinent labs & imaging results that were available during my care of the patient were reviewed by me and considered in my medical decision making (see chart for details).    Theresa Mooney is a 36 y.o. female who presents to ED for intermittent epigastric pain. Seen at [facility]U on 8/16 - chart reviewed at that time. Started on PPI which has somewhat helped. Pain became worse today - did eat pizza then immediately go to bed last night. Patient is nontoxic, nonseptic appearing with a non-surgical abdominal exam. Mild epigastric tenderness without rebound or guarding. Negative Murphy's. Resolved with Carafate. Labs reassuring.  Likely GERD vs. Gastritis. On repeat exam, abdominal exam with no peritoneal signs. No indication of appendicitis, bowel obstruction, bowel perforation, cholecystitis, diverticulitis, PID or ectopic pregnancy. Patient discharged home with symptomatic treatment and encouraged to follow up with PCP. I have also discussed reasons to return immediately to the ER. Patient expresses understanding and agrees with plan as dictated above.  Final Clinical Impressions(s) / ED Diagnoses   Final diagnoses:  Epigastric pain    New Prescriptions New Prescriptions   SUCRALFATE (CARAFATE) 1 G TABLET    Take 1 tablet (1 g total) by mouth 4 (four) times daily -  with meals and at bedtime.     Bastion Bolger, Chase Picket, PA-C 05/16/17 0030    Linwood Dibbles, MD  05/16/17 2314  

## 2017-05-15 NOTE — ED Triage Notes (Signed)
Pt comes from home via EMS with complaints of upper abdominal pain that is radiating to her lower back. Pt ambulatory from truck. Denies N&V.  Pt reports some hematuria on Thursday but it has since resolved.  Vitals WNL in route.

## 2017-05-16 MED ORDER — SUCRALFATE 1 G PO TABS: 1.0000 g | ORAL_TABLET | Freq: Three times a day (TID) | ORAL | 0 refills | Status: DC

## 2017-05-16 NOTE — Discharge Instructions (Signed)
It was my pleasure taking care of you today!   Fortunately, your lab work was very reassuring. Continue taking over the counter reflux medicine. Add in Carafate as needed. It is important that you follow up with a primary care doctor. If you do not have one, please see the information below. At this time, there does not appear to be an acute, emergent cause for your abdominal pain. However, this does not mean that your abdominal pain may not become an emergency in the future. It is VERY important that you monitor your symptoms and return to the Emergency Department if you develop any of the following symptoms:  The pain does not go away.  You have a fever.  You keep throwing up and can't keep fluids down.  You pass bloody or black tarry stools.  There is bright red blood in the stool. You do not seem to be getting better.  You have any questions or concerns.    To find a primary care or specialty doctor please call [phone] or [phone] to access "[facility] Find a Doctor Service."  You may also go on the [facility] website at [email or web address]  There are also multiple [place], [place] and [facility] practices throughout the [place] that are frequently accepting new patients. You may find a clinic that is close to your home and contact them.  [facility] - [address] [place] [phone]  [facility] in [place] (also locations in [place] and [place]) - [address] [employer] [place] [phone]  [facility] - [address] [place] [phone]   .

## 2017-05-19 ENCOUNTER — Encounter (HOSPITAL_COMMUNITY): Payer: Self-pay | Admitting: Nurse Practitioner

## 2017-05-19 ENCOUNTER — Ambulatory Visit (HOSPITAL_COMMUNITY)
Admission: EM | Admit: 2017-05-19 | Discharge: 2017-05-19 | Disposition: A | Discharge status: Home / Self Care | Payer: Self-pay | Attending: Emergency Medicine | Admitting: Emergency Medicine

## 2017-05-19 DIAGNOSIS — M545 Low back pain, unspecified: Secondary | ICD-10-CM

## 2017-05-19 DIAGNOSIS — K219 Gastro-esophageal reflux disease without esophagitis: Secondary | ICD-10-CM

## 2017-05-19 DIAGNOSIS — R1013 Epigastric pain: Secondary | ICD-10-CM

## 2017-05-19 MED ORDER — GI COCKTAIL ~~LOC~~
ORAL | Status: AC
  Filled 2017-05-19: qty 30

## 2017-05-19 MED ORDER — KETOROLAC TROMETHAMINE 30 MG/ML IJ SOLN
30.0000 mg | Freq: Once | INTRAMUSCULAR | Status: AC
  Administered 2017-05-19: 30 mg via INTRAMUSCULAR

## 2017-05-19 MED ORDER — GI COCKTAIL ~~LOC~~
30.0000 mL | Freq: Once | ORAL | Status: AC
  Administered 2017-05-19: 30 mL via ORAL

## 2017-05-19 MED ORDER — KETOROLAC TROMETHAMINE 30 MG/ML IJ SOLN
INTRAMUSCULAR | Status: AC
  Filled 2017-05-19: qty 1

## 2017-05-19 NOTE — ED Provider Notes (Signed)
  [facility]   [phone] 05/19/17 Arrival Time: 1623  ASSESSMENT & PLAN:  1. Acute midline low back pain without sciatica   2. Gastroesophageal reflux disease without esophagitis   3. Abdominal pain, epigastric     Meds ordered this encounter  Medications  . ketorolac (TORADOL) 30 MG/ML injection 30 mg  . gi cocktail (Maalox,Lidocaine,Donnatal)   Good results with GI cocktail and Toradol shot Continue on current meds  Referral to [facility] for PCP Reviewed expectations re: course of current medical issues. Questions answered. Outlined signs and symptoms indicating need for more acute intervention. Patient verbalized understanding. After Visit Summary given.   SUBJECTIVE:  Theresa Mooney is a 36 y.o. female who presents with complaint of back pain, epigastric discomfort.  She has hx of GERD and gastritis.  ROS: As per HPI.   OBJECTIVE:  Vitals:   05/19/17 1713  BP: 116/81  Pulse: 88  Temp: 98.9 F (37.2 C)  TempSrc: Oral  SpO2: 100%     General appearance: alert; no distress Eyes: PERRLA; EOMI; conjunctiva normal HENT: normocephalic; atraumatic;Lungs: clear to auscultation bilaterally Heart: regular rate and rhythm Abdomen: soft, tender epigastric region Neg Murphy's; bowel sounds normal; no masses or organomegaly; no guarding or rebound tenderness Back: no CVA tenderness, TTP bilateral lumbar muscles Extremities: no cyanosis or edema; symmetrical with no gross deformities Skin: warm and dry Neurologic: normal gait; normal symmetric reflexes Psychological: alert and cooperative; normal mood and affect  Past Medical History:  Diagnosis Date  . Herpes 2002  . Infection   . Trichimoniasis 12-2010     has a past medical history of Herpes (2002); Infection; and Trichimoniasis (12-2010).    Labs Reviewed - No data to display  Imaging: No results found.  No Known Allergies  Family History  Problem Relation Age of Onset  .  Anesthesia problems Neg Hx    Past Surgical History:  Procedure Laterality Date  . DILATION AND CURETTAGE OF UTERUS    . LAPAROSCOPY  11/27/2011   Procedure: LAPAROSCOPY OPERATIVE;  Surgeon: Catalina Antigua, MD;  Location: [facility];  Service: Gynecology;  Laterality: Bilateral;  . SALPINGECTOMY     Bilateral for twin ectopic preg.   . TUBAL LIGATION  12-05-2010         Deatra Canter, [place] 05/19/17 [phone]

## 2017-05-19 NOTE — ED Triage Notes (Signed)
Pt presents with c/o upper abdominal pain. The pain began about two weeks ago. The pain is an intermittent dull ache that is worse later in the day. The pain is not present when she wakes in the morning, She reports normal appetite, abnormal menstrual cycle. She denies fevers, weight loss, nausea, vomiting, dysuria, hematuria, vaginal discharge or bleeding, constipation, diarrhea.  [facility] and started on flagyl for bacterial vaginosis and a PPI for acid reflux and [facility] on 8/19 and started on carafate. She has been taking the medications as directed but continues to have the pain. The pain became worse yesterday and her mid to lower back began to hurt also

## 2017-05-19 NOTE — Discharge Instructions (Signed)
Continue omeprazole and carafate.  Follow up with [facility] for PCP

## 2017-05-20 ENCOUNTER — Emergency Department (HOSPITAL_COMMUNITY)
Admission: EM | Admit: 2017-05-20 | Discharge: 2017-05-20 | Disposition: A | Discharge status: Home / Self Care | Payer: Self-pay | Attending: Emergency Medicine | Admitting: Emergency Medicine

## 2017-05-20 ENCOUNTER — Encounter (HOSPITAL_COMMUNITY): Payer: Self-pay | Admitting: Emergency Medicine

## 2017-05-20 ENCOUNTER — Emergency Department (HOSPITAL_COMMUNITY): Payer: Self-pay

## 2017-05-20 DIAGNOSIS — S46811A Strain of other muscles, fascia and tendons at shoulder and upper arm level, right arm, initial encounter: Secondary | ICD-10-CM | POA: Insufficient documentation

## 2017-05-20 DIAGNOSIS — X58XXXA Exposure to other specified factors, initial encounter: Secondary | ICD-10-CM | POA: Insufficient documentation

## 2017-05-20 DIAGNOSIS — R0789 Other chest pain: Secondary | ICD-10-CM | POA: Insufficient documentation

## 2017-05-20 DIAGNOSIS — Y929 Unspecified place or not applicable: Secondary | ICD-10-CM | POA: Insufficient documentation

## 2017-05-20 DIAGNOSIS — Y9389 Activity, other specified: Secondary | ICD-10-CM | POA: Insufficient documentation

## 2017-05-20 DIAGNOSIS — Y999 Unspecified external cause status: Secondary | ICD-10-CM | POA: Insufficient documentation

## 2017-05-20 LAB — BASIC METABOLIC PANEL
Anion gap: 9 (ref 5–15)
BUN: 18 mg/dL (ref 6–20)
CALCIUM: 8.9 mg/dL (ref 8.9–10.3)
CHLORIDE: 105 mmol/L (ref 101–111)
CO2: 25 mmol/L (ref 22–32)
Creatinine, Ser: 0.97 mg/dL (ref 0.44–1.00)
GFR calc non Af Amer: >60 mL/min (ref >60)
GLUCOSE: 106 mg/dL — AB (ref 65–99)
Potassium: 3.6 mmol/L (ref 3.5–5.1)
Sodium: 139 mmol/L (ref 135–145)

## 2017-05-20 LAB — CBC
HEMATOCRIT: 33.7 % — AB (ref 36.0–46.0)
HEMOGLOBIN: 11 g/dL — AB (ref 12.0–15.0)
MCH: 27.6 pg (ref 26.0–34.0)
MCHC: 32.6 g/dL (ref 30.0–36.0)
MCV: 84.7 fL (ref 78.0–100.0)
Platelets: 302 10*3/uL (ref 150–400)
RBC: 3.98 MIL/uL (ref 3.87–5.11)
RDW: 13.7 % (ref 11.5–15.5)
WBC: 7 10*3/uL (ref 4.0–10.5)

## 2017-05-20 LAB — I-STAT TROPONIN, ED: TROPONIN I, POC: 0 ng/mL (ref 0.00–0.08)

## 2017-05-20 NOTE — ED Notes (Signed)
Pt updated on wait & plan of care

## 2017-05-20 NOTE — ED Provider Notes (Signed)
[facility]-EMERGENCY DEPT Provider Note   CSN: [id number] Arrival date & time: 05/20/17  0129     History   Chief Complaint Chief Complaint  Patient presents with  . Arm Pain  . Chest Pain    HPI Theresa Mooney is a 36 y.o. female.  36 yo F with a chief complaint of right-sided sharp chest pain. Last for seconds at a time usually when she has stressed out. Does have some radiation down the right arm to the bicep. Described as a paresthesia-like tingling. Has been having some epigastric abdominal discomfort that she is being treated for possible reflux disease. She was seen in urgent care yesterday for what was thought to be reflux was given a GI cocktail and a injection of some sort. She denies any exertional symptoms. Denies prior history of PE or DVT. Denies hemoptysis recent travel or surgery. Denies smoking history. Denies family history of MI.  When asked of the location of the chest pain she actually points to her trapezius muscle area.   The history is provided by the patient.  Arm Pain  This is a new problem. The current episode started yesterday. The problem occurs constantly. The problem has not changed since onset.Associated symptoms include chest pain. Pertinent negatives include no headaches and no shortness of breath. Nothing aggravates the symptoms. Nothing relieves the symptoms. She has tried nothing for the symptoms. The treatment provided no relief.  Chest Pain   Associated symptoms include numbness (paresthesia). Pertinent negatives include no dizziness, no fever, no headaches, no nausea, no palpitations, no shortness of breath and no vomiting.    Past Medical History:  Diagnosis Date  . Herpes 2002  . Infection   . Trichimoniasis [id number]    Patient Active Problem List   Diagnosis Date Noted  . h/o postpartum BTL  12/13/2011  . S/P ectopic pregnancy 12/13/2011    Past Surgical History:  Procedure Laterality Date  . DILATION AND CURETTAGE OF UTERUS    .  LAPAROSCOPY  11/27/2011   Procedure: LAPAROSCOPY OPERATIVE;  Surgeon: Catalina Antigua, MD;  Location: [facility];  Service: Gynecology;  Laterality: Bilateral;  . SALPINGECTOMY     Bilateral for twin ectopic preg.   . TUBAL LIGATION  12-05-2010    OB History    Gravida Para Term Preterm AB Living   7 5     2 5    SAB TAB Ectopic Multiple Live Births     1 1   1        Home Medications    Prior to Admission medications   Medication Sig Start Date End Date Taking? Authorizing Provider  omeprazole (PRILOSEC) 20 MG capsule Take 1 capsule (20 mg total) by mouth daily. 30 minutes before breakfast 05/12/17 05/12/18 Yes Degele, Kandra Nicolas, MD  sucralfate (CARAFATE) 1 g tablet Take 1 tablet (1 g total) by mouth 4 (four) times daily -  with meals and at bedtime. 05/16/17  Yes Ward, Chase Picket, PA-C  fluconazole (DIFLUCAN) 150 MG tablet Take 1 tablet (150 mg total) by mouth daily. 05/12/17   Degele, Kandra Nicolas, MD  metroNIDAZOLE (FLAGYL) 500 MG tablet Take 1 tablet (500 mg total) by mouth 2 (two) times daily. 05/12/17   Degele, Kandra Nicolas, MD    Family History Family History  Problem Relation Age of Onset  . Anesthesia problems Neg Hx     Social History Social History  Substance Use Topics  . Smoking status: Never Smoker  . Smokeless tobacco: Never Used  .  Alcohol use No     Allergies   Patient has no known allergies.   Review of Systems Review of Systems  Constitutional: Negative for chills and fever.  HENT: Negative for congestion and rhinorrhea.   Eyes: Negative for redness and visual disturbance.  Respiratory: Negative for shortness of breath and wheezing.   Cardiovascular: Positive for chest pain. Negative for palpitations.  Gastrointestinal: Negative for nausea and vomiting.  Genitourinary: Negative for dysuria and urgency.  Musculoskeletal: Negative for arthralgias and myalgias.  Skin: Negative for pallor and wound.  Neurological: Positive for numbness (paresthesia). Negative for  dizziness and headaches.     Physical Exam Updated Vital Signs BP (!) 113/56 (BP Location: Left Arm)   Pulse 76   Temp 99 F (37.2 C) (Oral)   Resp 15   Ht 4\' 11"  (1.499 m)   Wt 93.9 kg (207 lb)   LMP 03/09/2017 (Approximate) Comment: tubal ligation, recent miscarriage  SpO2 100%   BMI 41.81 kg/m   Physical Exam  Constitutional: She is oriented to person, place, and time. She appears well-developed and well-nourished. No distress.  HENT:  Head: Normocephalic and atraumatic.  Eyes: Pupils are equal, round, and reactive to light. EOM are normal.  Neck: Normal range of motion. Neck supple.  Cardiovascular: Normal rate and regular rhythm.  Exam reveals no gallop and no friction rub.   No murmur heard. Pulmonary/Chest: Effort normal. She has no wheezes. She has no rales. She exhibits no tenderness.  Abdominal: Soft. She exhibits no distension and no mass. There is no tenderness. There is no guarding.  Musculoskeletal: She exhibits tenderness (mild trapezius with spasm.  ). She exhibits no edema.  PMS intact distally  Neurological: She is alert and oriented to person, place, and time.  Skin: Skin is warm and dry. She is not diaphoretic.  Psychiatric: She has a normal mood and affect. Her behavior is normal.  Nursing note and vitals reviewed.    ED Treatments / Results  Labs (all labs ordered are listed, but only abnormal results are displayed) Labs Reviewed  BASIC METABOLIC PANEL - Abnormal; Notable for the following:       Result Value   Glucose, Bld 106 (*)    All other components within normal limits  CBC - Abnormal; Notable for the following:    Hemoglobin 11.0 (*)    HCT 33.7 (*)    All other components within normal limits  I-STAT TROPONIN, ED    EKG  EKG Interpretation  Date/Time:  Friday May 20 2017 01:37:07 EDT Ventricular Rate:  89 PR Interval:  176 QRS Duration: 84 QT Interval:  348 QTC Calculation: 423 R Axis:   20 Text Interpretation:  Normal  sinus rhythm with sinus arrhythmia Nonspecific ST and T wave abnormality Abnormal ECG No old tracing to compare Confirmed by Melene Plan [phone]) on 05/20/2017 9:06:15 AM       Radiology Dg Chest 2 View  Result Date: 05/20/2017 CLINICAL DATA:  Chest pain, shortness of breath, arm pain EXAM: CHEST  2 VIEW COMPARISON:  None. FINDINGS: Lungs are clear.  No pleural effusion or pneumothorax. The heart is normal in size. Visualized osseous structures are within normal limits. IMPRESSION: Normal chest radiographs. Electronically Signed   By: Charline Bills M.D.   On: 05/20/2017 01:54    Procedures Procedures (including critical care time)  Medications Ordered in ED Medications - No data to display   Initial Impression / Assessment and Plan / ED Course  I have  reviewed the triage vital signs and the nursing notes.  Pertinent labs & imaging results that were available during my care of the patient were reviewed by me and considered in my medical decision making (see chart for details).     36 yo F With a chief complaint of right-sided chest pain. Localized to the trapezius. EKG chest x-ray troponin labs performed in triage and reassuring. PERC negative. We'll place in a sling. Have her treat conservatively. PCP follow-up.  9:35 AM:  I have discussed the diagnosis/risks/treatment options with the patient and believe the pt to be eligible for discharge home to follow-up with PCP. We also discussed returning to the ED immediately if new or worsening sx occur. We discussed the sx which are most concerning (e.g., sudden worsening pain, fever, inability to tolerate by mouth) that necessitate immediate return. Medications administered to the patient during their visit and any new prescriptions provided to the patient are listed below.  Medications given during this visit Medications - No data to display   The patient appears reasonably screen and/or stabilized for discharge and I doubt any other  medical condition or other [facility] requiring further screening, evaluation, or treatment in the ED at this time prior to discharge.    Final Clinical Impressions(s) / ED Diagnoses   Final diagnoses:  Trapezius muscle strain, right, initial encounter  Atypical chest pain    New Prescriptions New Prescriptions   No medications on file     Melene Plan, DO 05/20/17 3612

## 2017-05-20 NOTE — ED Triage Notes (Signed)
Per [facility]: Pt to ED c/o sudden onset upper chest pain radiating to R arm and R arm tingling sensation x 1 hour. Pt was seen at [facility] for upper abd pain, was given "shot and cocktail" and discharged. Pt states when pain first came on, she had some SOB which has resolved since, and now the pain is localized in her R shoulder and down her arm. Resp e/u, skin warm/dry. Full ROM bilateral arms.

## 2017-05-20 NOTE — Discharge Instructions (Signed)
Take tylenol 1000mg(2 extra strength) four times a day.  ° °

## 2017-06-03 ENCOUNTER — Emergency Department (HOSPITAL_COMMUNITY)
Admission: EM | Admit: 2017-06-03 | Discharge: 2017-06-04 | Disposition: A | Discharge status: Home / Self Care | Payer: Self-pay | Attending: Emergency Medicine | Admitting: Emergency Medicine

## 2017-06-03 ENCOUNTER — Encounter (HOSPITAL_COMMUNITY): Payer: Self-pay | Admitting: Emergency Medicine

## 2017-06-03 DIAGNOSIS — K2950 Unspecified chronic gastritis without bleeding: Secondary | ICD-10-CM

## 2017-06-03 DIAGNOSIS — Z79899 Other long term (current) drug therapy: Secondary | ICD-10-CM | POA: Insufficient documentation

## 2017-06-03 DIAGNOSIS — K2960 Other gastritis without bleeding: Secondary | ICD-10-CM | POA: Insufficient documentation

## 2017-06-03 DIAGNOSIS — B9689 Other specified bacterial agents as the cause of diseases classified elsewhere: Secondary | ICD-10-CM | POA: Insufficient documentation

## 2017-06-03 DIAGNOSIS — N760 Acute vaginitis: Secondary | ICD-10-CM | POA: Insufficient documentation

## 2017-06-03 LAB — URINALYSIS, ROUTINE W REFLEX MICROSCOPIC
BILIRUBIN URINE: NEGATIVE
GLUCOSE, UA: NEGATIVE mg/dL
Ketones, ur: NEGATIVE mg/dL
Nitrite: NEGATIVE
PH: 5 (ref 5.0–8.0)
Protein, ur: NEGATIVE mg/dL
SPECIFIC GRAVITY, URINE: 1.02 (ref 1.005–1.030)

## 2017-06-03 LAB — POC URINE PREG, ED: Preg Test, Ur: NEGATIVE

## 2017-06-03 MED ORDER — GI COCKTAIL ~~LOC~~
30.0000 mL | Freq: Once | ORAL | Status: AC
  Administered 2017-06-03: 30 mL via ORAL
  Filled 2017-06-03: qty 30

## 2017-06-03 NOTE — ED Notes (Signed)
Pt reports epigastric pain which started today.  Pt reports hx of GERD and has been seen in the past for same.  Pt also reports recent tx for vaginal infection, which now is having dysuria and discharge.

## 2017-06-03 NOTE — ED Triage Notes (Signed)
Pt from home with c/o upper and lower abdominal pain. Pt states upper abd pain has been ongoing since July. Pt states she has been seen multiple times for this. Pt states she was recently treated for a vaginal infection and given flagyl. Pt thinks she has another infection as she is having pain in her abdomen and back with urinating and clear discharge.

## 2017-06-03 NOTE — ED Provider Notes (Signed)
[facility]-EMERGENCY DEPT Provider Note   CSN: [id number] Arrival date & time: 06/03/17  1902     History   Chief Complaint Chief Complaint  Patient presents with  . Abdominal Pain  . Dysuria    HPI Theresa Mooney is a 36 y.o. female.  The history is provided by the patient.  Abdominal Pain   This is a new problem. The current episode started 6 to 12 hours ago. The problem occurs constantly. Progression since onset: fluctuating. The pain is associated with an unknown factor. The pain is located in the suprapubic region. The quality of the pain is pressure-like. The pain is mild. Pertinent negatives include fever, diarrhea, melena and dysuria. Nothing aggravates the symptoms. The symptoms are relieved by urination.   Also complains of recurrent epigastric pain, burning sensation exacerbated with eating "stuff I am not suppose to eat."  Has not been sexually active since July (last time she had negative GC/Chlam).  Patient reports being diagnosed with bacterial vaginosis in July and not completing her antibiotic due to financial constraints. Would like to get rechecked.  Past Medical History:  Diagnosis Date  . Herpes 2002  . Infection   . Trichimoniasis [id number]    Patient Active Problem List   Diagnosis Date Noted  . h/o postpartum BTL  12/13/2011  . S/P ectopic pregnancy 12/13/2011    Past Surgical History:  Procedure Laterality Date  . DILATION AND CURETTAGE OF UTERUS    . LAPAROSCOPY  11/27/2011   Procedure: LAPAROSCOPY OPERATIVE;  Surgeon: Catalina Antigua, MD;  Location: [facility];  Service: Gynecology;  Laterality: Bilateral;  . SALPINGECTOMY     Bilateral for twin ectopic preg.   . TUBAL LIGATION  12-05-2010    OB History    Gravida Para Term Preterm AB Living   SAB TAB Ectopic Multiple Live Births     Home Medications    Prior to Admission medications   Medication Sig Start Date End Date Taking? Authorizing Provider  sucralfate  (CARAFATE) 1 g tablet Take 1 tablet (1 g total) by mouth 4 (four) times daily -  with meals and at bedtime. 05/16/17  Yes Ward, Chase Picket, PA-C  fluconazole (DIFLUCAN) 150 MG tablet Take 1 tablet (150 mg total) by mouth daily. Patient not taking: Reported on 06/03/2017 05/12/17   Degele, Kandra Nicolas, MD  metroNIDAZOLE (FLAGYL) 500 MG tablet Take 1 tablet (500 mg total) by mouth 2 (two) times daily. Patient not taking: Reported on 06/03/2017 05/12/17   Degele, Kandra Nicolas, MD  omeprazole (PRILOSEC) 20 MG capsule Take 1 capsule (20 mg total) by mouth daily. 30 minutes before breakfast Patient taking differently: Take 20 mg by mouth daily as needed (indigestion). 30 minutes before breakfast 05/12/17 05/12/18  Degele, Kandra Nicolas, MD    Family History Family History  Problem Relation Age of Onset  . Anesthesia problems Neg Hx     Social History Social History  Substance Use Topics  . Smoking status: Never Smoker  . Smokeless tobacco: Never Used  . Alcohol use No     Allergies   Patient has no known allergies.   Review of Systems Review of Systems  Constitutional: Negative for fever.  Gastrointestinal: Positive for abdominal pain. Negative for diarrhea and melena.  Genitourinary: Negative for dysuria, vaginal bleeding, vaginal discharge and vaginal pain.   All other systems are reviewed and are  negative for acute change except as noted in the HPI  Physical Exam Updated Vital Signs BP (!) 143/73 (BP Location: Right Arm)   Pulse 77   Temp 99.1 F (37.3 C) (Oral)   Resp 18   LMP 05/27/2017   SpO2 99%   Physical Exam  Constitutional: She is oriented to person, place, and time. She appears well-developed and well-nourished. No distress.  HENT:  Head: Normocephalic and atraumatic.  Nose: Nose normal.  Eyes: Pupils are equal, round, and reactive to light. Conjunctivae and EOM are normal. Right eye exhibits no discharge. Left eye exhibits no discharge. No scleral icterus.  Neck: Normal range of  motion. Neck supple.  Cardiovascular: Normal rate and regular rhythm.  Exam reveals no gallop and no friction rub.   No murmur heard. Pulmonary/Chest: Effort normal and breath sounds normal. No stridor. No respiratory distress. She has no rales.  Abdominal: Soft. She exhibits no distension. There is tenderness in the epigastric area and left upper quadrant. There is no rigidity, no rebound, no guarding and no CVA tenderness.  Musculoskeletal: She exhibits no edema or tenderness.  Neurological: She is alert and oriented to person, place, and time.  Skin: Skin is warm and dry. No rash noted. She is not diaphoretic. No erythema.  Psychiatric: She has a normal mood and affect.  Vitals reviewed.    ED Treatments / Results  Labs (all labs ordered are listed, but only abnormal results are displayed) Labs Reviewed  WET PREP, GENITAL - Abnormal; Notable for the following:       Result Value   Yeast Wet Prep HPF POC FEW (*)    Clue Cells Wet Prep HPF POC MANY (*)    WBC, Wet Prep HPF POC MANY (*)    All other components within normal limits  URINALYSIS, ROUTINE W REFLEX MICROSCOPIC - Abnormal; Notable for the following:    APPearance HAZY (*)    Hgb urine dipstick LARGE (*)    Leukocytes, UA MODERATE (*)    Bacteria, UA RARE (*)    Squamous Epithelial / LPF 6-30 (*)    All other components within normal limits  CBC WITH DIFFERENTIAL/PLATELET - Abnormal; Notable for the following:    Hemoglobin 11.7 (*)    All other components within normal limits  COMPREHENSIVE METABOLIC PANEL - Abnormal; Notable for the following:    Potassium 3.4 (*)    All other components within normal limits  LIPASE, BLOOD  POC URINE PREG, ED    EKG  EKG Interpretation None       Radiology No results found.  Procedures Procedures (including critical care time)  Medications Ordered in ED Medications  metroNIDAZOLE (FLAGYL) tablet 500 mg (not administered)  gi cocktail (Maalox,Lidocaine,Donnatal) (30  mLs Oral Given 06/03/17 2339)     Initial Impression / Assessment and Plan / ED Course  I have reviewed the triage vital signs and the nursing notes.  Pertinent labs & imaging results that were available during my care of the patient were reviewed by me and considered in my medical decision making (see chart for details).     Epigastric abdominal discomfort. Mild discomfort with palpation. No evidence of peritonitis. Workup without evidence to suggest acute cholecystitis, pancreatitis or other serious intra-abdominal inflammatory/infectious process. No indication for advanced imaging at this time. Most likely secondary to exacerbation of her known gastritis. Significant improvement following GI cocktail.  Blind sweep Wet prep with evidence of BV. Will treat with flagyl. No suprapubic tenderness to palpation  concerning for PID.  The patient is safe for discharge with strict return precautions.   Final Clinical Impressions(s) / ED Diagnoses   Final diagnoses:  Other chronic gastritis, presence of bleeding unspecified  Bacterial vaginosis   Disposition: Discharge  Condition: Good  I have discussed the results, Dx and Tx plan with the patient who expressed understanding and agree(s) with the plan. Discharge instructions discussed at great length. The patient was given strict return precautions who verbalized understanding of the instructions. No further questions at time of discharge.    Discharge Medication List as of 06/04/2017  1:11 AM      Follow Up: Primary care provider  Schedule an appointment as soon as possible for a visit  As needed      Cardama, Amadeo Garnet, MD 06/06/17 1458

## 2017-06-03 NOTE — ED Notes (Signed)
Writer states they attempted to stick patient for blood draw but was unsuccessful. RN notified.

## 2017-06-04 ENCOUNTER — Encounter (HOSPITAL_COMMUNITY): Payer: Self-pay

## 2017-06-04 ENCOUNTER — Inpatient Hospital Stay (HOSPITAL_COMMUNITY)
Admission: AD | Admit: 2017-06-04 | Discharge: 2017-06-04 | Disposition: A | Discharge status: Home / Self Care | Payer: Self-pay | Source: Ambulatory Visit | Attending: Obstetrics & Gynecology | Admitting: Obstetrics & Gynecology

## 2017-06-04 DIAGNOSIS — R1013 Epigastric pain: Secondary | ICD-10-CM

## 2017-06-04 DIAGNOSIS — N3001 Acute cystitis with hematuria: Secondary | ICD-10-CM | POA: Insufficient documentation

## 2017-06-04 DIAGNOSIS — K219 Gastro-esophageal reflux disease without esophagitis: Secondary | ICD-10-CM | POA: Insufficient documentation

## 2017-06-04 DIAGNOSIS — M546 Pain in thoracic spine: Secondary | ICD-10-CM | POA: Insufficient documentation

## 2017-06-04 DIAGNOSIS — M549 Dorsalgia, unspecified: Secondary | ICD-10-CM

## 2017-06-04 DIAGNOSIS — R319 Hematuria, unspecified: Secondary | ICD-10-CM

## 2017-06-04 LAB — COMPREHENSIVE METABOLIC PANEL
ALT: 15 U/L (ref 14–54)
AST: 16 U/L (ref 15–41)
Albumin: 4.1 g/dL (ref 3.5–5.0)
Alkaline Phosphatase: 86 U/L (ref 38–126)
Anion gap: 9 (ref 5–15)
BUN: 13 mg/dL (ref 6–20)
CHLORIDE: 106 mmol/L (ref 101–111)
CO2: 25 mmol/L (ref 22–32)
CREATININE: 0.89 mg/dL (ref 0.44–1.00)
Calcium: 9.2 mg/dL (ref 8.9–10.3)
GFR calc Af Amer: >60 mL/min (ref >60)
GFR calc non Af Amer: >60 mL/min (ref >60)
Glucose, Bld: 87 mg/dL (ref 65–99)
Potassium: 3.4 mmol/L — ABNORMAL LOW (ref 3.5–5.1)
SODIUM: 140 mmol/L (ref 135–145)
Total Bilirubin: 0.5 mg/dL (ref 0.3–1.2)
Total Protein: 8 g/dL (ref 6.5–8.1)

## 2017-06-04 LAB — LIPASE, BLOOD: Lipase: 26 U/L (ref 11–51)

## 2017-06-04 LAB — URINALYSIS, ROUTINE W REFLEX MICROSCOPIC
BILIRUBIN URINE: NEGATIVE
Bacteria, UA: NONE SEEN
Glucose, UA: NEGATIVE mg/dL
Ketones, ur: NEGATIVE mg/dL
NITRITE: NEGATIVE
PH: 5 (ref 5.0–8.0)
Protein, ur: NEGATIVE mg/dL
SPECIFIC GRAVITY, URINE: 1.021 (ref 1.005–1.030)

## 2017-06-04 LAB — CBC WITH DIFFERENTIAL/PLATELET
BASOS ABS: 0 10*3/uL (ref 0.0–0.1)
Basophils Relative: 0 %
EOS ABS: 0.1 10*3/uL (ref 0.0–0.7)
EOS PCT: 1 %
HCT: 36.4 % (ref 36.0–46.0)
Hemoglobin: 11.7 g/dL — ABNORMAL LOW (ref 12.0–15.0)
Lymphocytes Relative: 53 %
Lymphs Abs: 3.3 10*3/uL (ref 0.7–4.0)
MCH: 27.7 pg (ref 26.0–34.0)
MCHC: 32.1 g/dL (ref 30.0–36.0)
MCV: 86.3 fL (ref 78.0–100.0)
Monocytes Absolute: 0.4 10*3/uL (ref 0.1–1.0)
Monocytes Relative: 7 %
Neutro Abs: 2.4 10*3/uL (ref 1.7–7.7)
Neutrophils Relative %: 39 %
Platelets: 350 10*3/uL (ref 150–400)
RBC: 4.22 MIL/uL (ref 3.87–5.11)
RDW: 14.1 % (ref 11.5–15.5)
WBC: 6.1 10*3/uL (ref 4.0–10.5)

## 2017-06-04 LAB — WET PREP, GENITAL
SPERM: NONE SEEN
Trich, Wet Prep: NONE SEEN

## 2017-06-04 MED ORDER — PANTOPRAZOLE SODIUM 20 MG PO TBEC
20.0000 mg | DELAYED_RELEASE_TABLET | Freq: Once | ORAL | Status: AC
  Administered 2017-06-04: 20 mg via ORAL
  Filled 2017-06-04: qty 1

## 2017-06-04 MED ORDER — METRONIDAZOLE 500 MG PO TABS
500.0000 mg | ORAL_TABLET | Freq: Once | ORAL | Status: AC
  Administered 2017-06-04: 500 mg via ORAL
  Filled 2017-06-04: qty 1

## 2017-06-04 MED ORDER — METRONIDAZOLE 500 MG PO TABS: 500.0000 mg | ORAL_TABLET | Freq: Two times a day (BID) | ORAL | 0 refills | Status: AC

## 2017-06-04 MED ORDER — CEPHALEXIN 500 MG PO CAPS: 500.0000 mg | ORAL_CAPSULE | Freq: Four times a day (QID) | ORAL | 0 refills | Status: DC

## 2017-06-04 MED ORDER — PANTOPRAZOLE SODIUM 20 MG PO TBEC: 20.0000 mg | DELAYED_RELEASE_TABLET | Freq: Every day | ORAL | 1 refills | Status: DC

## 2017-06-04 NOTE — Discharge Instructions (Signed)
Abdominal Pain, Adult Abdominal pain can be caused by many things. Often, abdominal pain is not serious and it gets better with no treatment or by being treated at home. However, sometimes abdominal pain is serious. Your health care provider will do a medical history and a physical exam to try to determine the cause of your abdominal pain. Follow these instructions at home:  Take over-the-counter and prescription medicines only as told by your health care provider. Do not take a laxative unless told by your health care provider.  Drink enough fluid to keep your urine clear or pale yellow.  Watch your condition for any changes.  Keep all follow-up visits as told by your health care provider. This is important. Contact a health care provider if:  Your abdominal pain changes or gets worse.  You are not hungry or you lose weight without trying.  You are constipated or have diarrhea for more than 2-3 days.  You have pain when you urinate or have a bowel movement.  Your abdominal pain wakes you up at night.  Your pain gets worse with meals, after eating, or with certain foods.  You are throwing up and cannot keep anything down.  You have a fever. Get help right away if:  Your pain does not go away as soon as your health care provider told you to expect.  You cannot stop throwing up.  Your pain is only in areas of the abdomen, such as the right side or the left lower portion of the abdomen.  You have bloody or black stools, or stools that look like tar.  You have severe pain, cramping, or bloating in your abdomen.  You have signs of dehydration, such as: ? Dark urine, very little urine, or no urine. ? Cracked lips. ? Dry mouth. ? Sunken eyes. ? Sleepiness. ? Weakness. This information is not intended to replace advice given to you by your health care provider. Make sure you discuss any questions you have with your health care provider. Document Released: 06/23/2005 Document  Revised: 04/02/2016 Document Reviewed: 02/25/2016 Elsevier Interactive Patient Education  2017 [employer]  Urinary Tract Infection, Adult A urinary tract infection (UTI) is an infection of any part of the urinary tract, which includes the kidneys, ureters, bladder, and urethra. These organs make, store, and get rid of urine in the body. UTI can be a bladder infection (cystitis) or kidney infection (pyelonephritis). What are the causes? This infection may be caused by fungi, viruses, or bacteria. Bacteria are the most common cause of UTIs. This condition can also be caused by repeated incomplete emptying of the bladder during urination. What increases the risk? This condition is more likely to develop if:  You ignore your need to urinate or hold urine for long periods of time.  You do not empty your bladder completely during urination.  You wipe back to front after urinating or having a bowel movement, if you are female.  You are uncircumcised, if you are female.  You are constipated.  You have a urinary catheter that stays in place (indwelling).  You have a weak defense (immune) system.  You have a medical condition that affects your bowels, kidneys, or bladder.  You have diabetes.  You take antibiotic medicines frequently or for long periods of time, and the antibiotics no longer work well against certain types of infections (antibiotic resistance).  You take medicines that irritate your urinary tract.  You are exposed to chemicals that irritate your urinary tract.  You are female.  What are the signs or symptoms? Symptoms of this condition include:  Fever.  Frequent urination or passing small amounts of urine frequently.  Needing to urinate urgently.  Pain or burning with urination.  Urine that smells bad or unusual.  Cloudy urine.  Pain in the lower abdomen or back.  Trouble urinating.  Blood in the urine.  Vomiting or being less hungry than  normal.  Diarrhea or abdominal pain.  Vaginal discharge, if you are female.  How is this diagnosed? This condition is diagnosed with a medical history and physical exam. You will also need to provide a urine sample to test your urine. Other tests may be done, including:  Blood tests.  Sexually transmitted disease (STD) testing.  If you have had more than one UTI, a cystoscopy or imaging studies may be done to determine the cause of the infections. How is this treated? Treatment for this condition often includes a combination of two or more of the following:  Antibiotic medicine.  Other medicines to treat less common causes of UTI.  Over-the-counter medicines to treat pain.  Drinking enough water to stay hydrated.  Follow these instructions at home:  Take over-the-counter and prescription medicines only as told by your health care provider.  If you were prescribed an antibiotic, take it as told by your health care provider. Do not stop taking the antibiotic even if you start to feel better.  Avoid alcohol, caffeine, tea, and carbonated beverages. They can irritate your bladder.  Drink enough fluid to keep your urine clear or pale yellow.  Keep all follow-up visits as told by your health care provider. This is important.  Make sure to: ? Empty your bladder often and completely. Do not hold urine for long periods of time. ? Empty your bladder before and after sex. ? Wipe from front to back after a bowel movement if you are female. Use each tissue one time when you wipe. Contact a health care provider if:  You have back pain.  You have a fever.  You feel nauseous or vomit.  Your symptoms do not get better after 3 days.  Your symptoms go away and then return. Get help right away if:  You have severe back pain or lower abdominal pain.  You are vomiting and cannot keep down any medicines or water. This information is not intended to replace advice given to you by  your health care provider. Make sure you discuss any questions you have with your health care provider. Document Released: 06/23/2005 Document Revised: 02/25/2016 Document Reviewed: 08/04/2015 Elsevier Interactive Patient Education  2017 [employer].

## 2017-06-04 NOTE — MAU Provider Note (Signed)
Chief Complaint:  Abdominal Pain and Back Pain   First Provider Initiated Contact with Patient 06/04/17 1704      HPI: Theresa Mooney is a 36 y.o. [id number] who presents to maternity admissions reporting epigastric pain since July which radiates to mid back.  Has been seen for this and other problems several times since July. Has been treated for GERD with some relief with carafate, but not significant relief.  No constipation or diarrhea. She reports no vaginal bleeding, vaginal itching/burning, urinary symptoms, h/a, dizziness, n/v, or fever/chills.    Abdominal Pain  This is a recurrent problem. The current episode started more than 1 month ago. The onset quality is gradual. The problem occurs intermittently. The problem has been unchanged. The pain is located in the epigastric region. The quality of the pain is sharp and burning. The abdominal pain radiates to the back. Pertinent negatives include no constipation, diarrhea, dysuria, fever, frequency, headaches, myalgias, nausea or vomiting. Nothing aggravates the pain. The pain is relieved by nothing. She has tried antacids and proton pump inhibitors for the symptoms. The treatment provided mild relief. Prior workup: labs only.  Back Pain  This is a recurrent problem. The current episode started more than 1 month ago. The problem occurs constantly. The problem is unchanged. The pain is present in the thoracic spine. The quality of the pain is described as aching. The pain is moderate. The pain is the same all the time. Associated symptoms include abdominal pain. Pertinent negatives include no dysuria, fever or headaches. She has tried nothing for the symptoms.    RN Note: Pt complains of abdominal pain and soreness and back pain. Also feeling pain when she urinates. Was seen yesterday and given medications for BV.   Past Medical History: Past Medical History:  Diagnosis Date  . Herpes 2002  . Infection   . Trichimoniasis 12-2010    Past  obstetric history: OB History  Gravida Para Term Preterm AB Living  SAB TAB Ectopic Multiple Live Births    # Outcome Date GA Lbr Len/2nd Weight Sex Delivery Anes PTL Lv  7 Para           6 Para           5 Para           4 Para           3 Para         LIV  2 TAB           1 Ectopic               Past Surgical History: Past Surgical History:  Procedure Laterality Date  . DILATION AND CURETTAGE OF UTERUS    . LAPAROSCOPY  11/27/2011   Procedure: LAPAROSCOPY OPERATIVE;  Surgeon: Catalina Antigua, MD;  Location: [facility];  Service: Gynecology;  Laterality: Bilateral;  . SALPINGECTOMY     Bilateral for twin ectopic preg.   . TUBAL LIGATION  12-05-2010    Family History: Family History  Problem Relation Age of Onset  . Anesthesia problems Neg Hx     Social History: Social History  Substance Use Topics  . Smoking status: Never Smoker  . Smokeless tobacco: Never Used  . Alcohol use No    Allergies: No Known Allergies  Meds:  Prescriptions Prior to Admission  Medication Sig Dispense Refill Last Dose  .  fluconazole (DIFLUCAN) 150 MG tablet Take 1 tablet (150 mg total) by mouth daily. 1 tablet 0 Past Month at Unknown time  . metroNIDAZOLE (FLAGYL) 500 MG tablet Take 1 tablet (500 mg total) by mouth 2 (two) times daily. 14 tablet 0 Past Month at Unknown time  . omeprazole (PRILOSEC) 20 MG capsule Take 1 capsule (20 mg total) by mouth daily. 30 minutes before breakfast 30 capsule 1 06/03/2017 at Unknown time  . sucralfate (CARAFATE) 1 g tablet Take 1 tablet (1 g total) by mouth 4 (four) times daily -  with meals and at bedtime. 60 tablet 0 06/04/2017 at Unknown time    I have reviewed patient's Past Medical Hx, Surgical Hx, Family Hx, Social Hx, medications and allergies.  ROS:  Review of Systems  Constitutional: Negative for fever.  Gastrointestinal: Positive for abdominal pain. Negative for constipation, diarrhea, nausea and vomiting.  Genitourinary:  Negative for dysuria and frequency.  Musculoskeletal: Positive for back pain. Negative for myalgias.  Neurological: Negative for headaches.   Other systems negative     Physical Exam  Patient Vitals for the past 24 hrs:  BP Temp Pulse Resp SpO2  06/04/17 1621 - - - - 98 %  06/04/17 1620 137/81 98.1 F (36.7 C) 82 16 -   Constitutional: Well-developed, well-nourished female in no acute distress.  Cardiovascular: normal rate and rhythm, no ectopy audible, S1 & S2 heard, no murmur Respiratory: normal effort, no distress. Lungs CTAB with no wheezes or crackles GI: Abd soft, non-tender.  Nondistended.  No rebound, No guarding.  Bowel Sounds audible  MS: Extremities nontender, no edema, normal ROM Neurologic: Alert and oriented x 4.   Grossly nonfocal. GU: Neg CVAT. Skin:  Warm and Dry Psych:  Affect appropriate.  PELVIC EXAM: not indicated, recently done   Labs: Results for orders placed or performed during the hospital encounter of 06/04/17 (from the past 24 hour(s))  Urinalysis, Routine w reflex microscopic     Status: Abnormal   Collection Time: 06/04/17  4:04 PM  Result Value Ref Range   Color, Urine YELLOW YELLOW   APPearance HAZY (A) CLEAR   Specific Gravity, Urine 1.021 1.005 - 1.030   pH 5.0 5.0 - 8.0   Glucose, UA NEGATIVE NEGATIVE mg/dL   Hgb urine dipstick LARGE (A) NEGATIVE   Bilirubin Urine NEGATIVE NEGATIVE   Ketones, ur NEGATIVE NEGATIVE mg/dL   Protein, ur NEGATIVE NEGATIVE mg/dL   Nitrite NEGATIVE NEGATIVE   Leukocytes, UA SMALL (A) NEGATIVE   RBC / HPF 6-30 0 - 5 RBC/hpf   WBC, UA 0-5 0 - 5 WBC/hpf   Bacteria, UA NONE SEEN NONE SEEN   Squamous Epithelial / LPF 0-5 (A) NONE SEEN   Mucus PRESENT       Imaging:  Dg Chest 2 View  Result Date: 05/20/2017 CLINICAL DATA:  Chest pain, shortness of breath, arm pain EXAM: CHEST  2 VIEW COMPARISON:  None. FINDINGS: Lungs are clear.  No pleural effusion or pneumothorax. The heart is normal in size. Visualized  osseous structures are within normal limits. IMPRESSION: Normal chest radiographs. Electronically Signed   By: Charline Bills M.D.   On: 05/20/2017 01:54    MAU Course/MDM: I have ordered labs as follows:  Got a better clean catch urine today.  Small leuks and large hemoglobin may indicate UTI or stone.  Will treat presumptively for UTI.   Blood tests done recently in ER are normal  Imaging ordered: Given the pattern of her pain,  radiation to her back, and limited relief from carafate/prilosec, Will order outpatient US for renal stones and for gallstones, as these have never been done. .   Treatments in MAU included Protonix.    Has application for [facility].  Also will give number for [facility].  Is applying for health insurance in December.   Pt stable at time of discharge.  Assessment: 1. Epigastric pain   2. Hematuria   3. Mid back pain   4. Acute cystitis with hematuria     Plan: Discharge home Recommend Take antibiotic and PPI.   Rx sent for Protonix  for GERD Rx Keflex for possible UTI Outpatient US ordered, Renal and RUQ If either of those are positive, will refer to specialist indicated  Encouraged to return here or to other Urgent Care/ED if she develops worsening of symptoms, increase in pain, fever, or other concerning symptoms.    Follow-up Information    [facility]. Call.   Contact information: [address] [place] [phone]       [facility]. Call.   Contact information: [address] [place] [phone]          Encouraged to return here or to other Urgent Care/ED if she develops worsening of symptoms, increase in pain, fever, or other concerning symptoms.   Wynelle BourgeoisMarie Hiba Garry CNM, MSN Certified Nurse-Midwife 06/04/2017 5:25 PM

## 2017-06-04 NOTE — MAU Note (Signed)
Pt complains of abdominal pain and soreness and back pain. Also feeling pain when she urinates. Was seen yesterday and given medications for BV.

## 2017-06-05 LAB — URINE CULTURE

## 2017-06-10 ENCOUNTER — Other Ambulatory Visit: Payer: Self-pay | Admitting: Student

## 2017-06-10 DIAGNOSIS — R319 Hematuria, unspecified: Secondary | ICD-10-CM

## 2017-06-13 ENCOUNTER — Ambulatory Visit (HOSPITAL_COMMUNITY): Payer: Self-pay

## 2017-06-13 ENCOUNTER — Ambulatory Visit (HOSPITAL_COMMUNITY)
Admission: RE | Admit: 2017-06-13 | Discharge: 2017-06-13 | Disposition: A | Discharge status: Home / Self Care | Payer: Self-pay | Source: Ambulatory Visit | Attending: Student | Admitting: Student

## 2017-06-13 DIAGNOSIS — R319 Hematuria, unspecified: Secondary | ICD-10-CM | POA: Insufficient documentation

## 2017-06-13 DIAGNOSIS — M549 Dorsalgia, unspecified: Secondary | ICD-10-CM | POA: Insufficient documentation

## 2017-06-13 DIAGNOSIS — R1013 Epigastric pain: Secondary | ICD-10-CM | POA: Insufficient documentation

## 2017-06-14 ENCOUNTER — Telehealth: Payer: Self-pay | Admitting: *Deleted

## 2017-06-14 NOTE — Telephone Encounter (Signed)
Patient transferred to me from u/s, looking for u/s result from yesterday. After verifying pt ID, normal results given for abdominal u/s. Patient voiced understanding and had no further questions.

## 2017-06-15 ENCOUNTER — Telehealth: Payer: Self-pay

## 2017-06-15 NOTE — Telephone Encounter (Signed)
Pt called of [place] results.

## 2017-07-04 ENCOUNTER — Ambulatory Visit: Payer: Self-pay | Admitting: Internal Medicine

## 2017-07-11 ENCOUNTER — Ambulatory Visit (HOSPITAL_COMMUNITY)
Admission: EM | Admit: 2017-07-11 | Discharge: 2017-07-11 | Disposition: A | Discharge status: Home / Self Care | Payer: Self-pay | Attending: Emergency Medicine | Admitting: Emergency Medicine

## 2017-07-11 ENCOUNTER — Encounter (HOSPITAL_COMMUNITY): Payer: Self-pay | Admitting: *Deleted

## 2017-07-11 DIAGNOSIS — S39012A Strain of muscle, fascia and tendon of lower back, initial encounter: Secondary | ICD-10-CM

## 2017-07-11 DIAGNOSIS — S29019A Strain of muscle and tendon of unspecified wall of thorax, initial encounter: Secondary | ICD-10-CM
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
## 2017-07-11 NOTE — Discharge Instructions (Signed)
Apply heat to the areas of soreness. He may also want to applySalonPas patch and heat wrap. Perform stretches as discussed. Ibuprofen 600 mg every 6 hours as needed. Tylenol every 4 hours as needed. No heavy lifting bending or overhead work for the next 3-4 days.
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
## 2017-07-18 ENCOUNTER — Ambulatory Visit: Payer: Self-pay | Admitting: Internal Medicine

## 2017-11-21 ENCOUNTER — Encounter (HOSPITAL_COMMUNITY): Payer: Self-pay

## 2017-11-21 ENCOUNTER — Ambulatory Visit (HOSPITAL_COMMUNITY)
Admission: EM | Admit: 2017-11-21 | Discharge: 2017-11-21 | Disposition: A | Discharge status: Home / Self Care | Payer: Self-pay | Attending: Family Medicine | Admitting: Family Medicine

## 2017-11-21 DIAGNOSIS — Z79899 Other long term (current) drug therapy: Secondary | ICD-10-CM | POA: Insufficient documentation

## 2017-11-21 DIAGNOSIS — Z3202 Encounter for pregnancy test, result negative: Secondary | ICD-10-CM

## 2017-11-21 DIAGNOSIS — R1010 Upper abdominal pain, unspecified: Secondary | ICD-10-CM | POA: Insufficient documentation

## 2017-11-21 DIAGNOSIS — N898 Other specified noninflammatory disorders of vagina: Secondary | ICD-10-CM

## 2017-11-21 DIAGNOSIS — R350 Frequency of micturition: Secondary | ICD-10-CM | POA: Insufficient documentation

## 2017-11-21 LAB — POCT PREGNANCY, URINE: Preg Test, Ur: NEGATIVE

## 2017-11-21 LAB — POCT URINALYSIS DIP (DEVICE)
Bilirubin Urine: NEGATIVE
GLUCOSE, UA: NEGATIVE mg/dL
KETONES UR: NEGATIVE mg/dL
Nitrite: NEGATIVE
PH: 6.5 (ref 5.0–8.0)
Protein, ur: NEGATIVE mg/dL
SPECIFIC GRAVITY, URINE: 1.025 (ref 1.005–1.030)
UROBILINOGEN UA: 0.2 mg/dL (ref 0.0–1.0)

## 2017-11-21 MED ORDER — METRONIDAZOLE 500 MG PO TABS: 500.0000 mg | ORAL_TABLET | Freq: Two times a day (BID) | ORAL | 0 refills | Status: AC

## 2017-11-21 MED ORDER — FLUCONAZOLE 200 MG PO TABS: 200.0000 mg | ORAL_TABLET | Freq: Once | ORAL | 0 refills | Status: AC

## 2017-11-21 NOTE — Discharge Instructions (Signed)
I have sent your urine for culture to ensure no urinary tract infection. Your urine is also being testing for bacteria, yeast and std's. We will call if any positive results. Please establish with a primary care provider for further management of your abdominal pain, irregular periods, blood to urine and for recheck of symptoms.

## 2017-11-21 NOTE — ED Triage Notes (Signed)
Pt said she is having abdominal pain, pelvic pain, breast tenderness, about 5 days late on her period and having discharge that is a fishy odor, with white clumpy discharge. Also complains of urinary frequency.

## 2017-11-21 NOTE — ED Provider Notes (Signed)
[facility]    CSN: [id number] Arrival date & time: 11/21/17  1019     History   Chief Complaint Chief Complaint  Patient presents with  . Abdominal Pain    HPI Theresa Mooney is a 37 y.o. female.   [name] presents with complaints of vaginal discharge, itching, irritation, odor which started over the past few days. Her period is 5 days late and she feels she has breast tenderness, mild nausea and intermittent abdominal pain. She states she has had upper abdominal pain for some time now, takes protonix for this which helps. Has had a tubal ligation. Has had irregular periods in the past. States feels similar to BV and yeast she has had in the past. She is sexually active with 1 partner, denies concern for std. She is not on birth control. She does not follow with a gynecologies or PCP. States she has had some urinary frequency but without pain urgency or blood to urine.    ROS per HPI.       Past Medical History:  Diagnosis Date  . Herpes 2002  . Infection   . Trichimoniasis [id number]    Patient Active Problem List   Diagnosis Date Noted  . h/o postpartum BTL  12/13/2011  . S/P ectopic pregnancy 12/13/2011    Past Surgical History:  Procedure Laterality Date  . DILATION AND CURETTAGE OF UTERUS    . LAPAROSCOPY  11/27/2011   Procedure: LAPAROSCOPY OPERATIVE;  Surgeon: Catalina AntiguaPeggy Constant, MD;  Location: [facility];  Service: Gynecology;  Laterality: Bilateral;  . SALPINGECTOMY     Bilateral for twin ectopic preg.   . TUBAL LIGATION  12-05-2010    OB History    Gravida Para Term Preterm AB Living   7 5     2 5    SAB TAB Ectopic Multiple Live Births     1 1   1        Home Medications    Prior to Admission medications   Medication Sig Start Date End Date Taking? Authorizing Provider  pantoprazole (PROTONIX) 20 MG tablet Take 1 tablet (20 mg total) by mouth daily. 06/04/17  Yes Aviva SignsWilliams, Marie L, CNM  fluconazole (DIFLUCAN) 200 MG tablet Take 1 tablet (200 mg  total) by mouth once for 1 dose. 11/21/17 11/21/17  Georgetta HaberBurky, Arlen Dupuis B, NP  metroNIDAZOLE (FLAGYL) 500 MG tablet Take 1 tablet (500 mg total) by mouth 2 (two) times daily for 7 days. 11/21/17 11/28/17  Georgetta HaberBurky, Jakarri Lesko B, NP  sucralfate (CARAFATE) 1 g tablet Take 1 tablet (1 g total) by mouth 4 (four) times daily -  with meals and at bedtime. 05/16/17   Ward, Chase PicketJaime Pilcher, PA-C    Family History Family History  Problem Relation Age of Onset  . Anesthesia problems Neg Hx     Social History Social History   Tobacco Use  . Smoking status: Never Smoker  . Smokeless tobacco: Never Used  Substance Use Topics  . Alcohol use: No  . Drug use: No     Allergies   Patient has no known allergies.   Review of Systems Review of Systems   Physical Exam Triage Vital Signs ED Triage Vitals  Enc Vitals Group     BP 11/21/17 1121 103/81     Pulse Rate 11/21/17 1121 74     Resp 11/21/17 1121 18     Temp 11/21/17 1121 98.4 F (36.9 C)     Temp Source 11/21/17 1121 Oral  SpO2 11/21/17 1121 100 %     Weight --      Height --      Head Circumference --      Peak Flow --      Pain Score 11/21/17 1124 6     Pain Loc --      Pain Edu? --      Excl. in GC? --    No data found.  Updated Vital Signs BP 103/81 (BP Location: Left Arm)   Pulse 74   Temp 98.4 F (36.9 C) (Oral)   Resp 18   LMP 10/17/2017 (Exact Date)   SpO2 100%   Visual Acuity Right Eye Distance:   Left Eye Distance:   Bilateral Distance:    Right Eye Near:   Left Eye Near:    Bilateral Near:     Physical Exam  Constitutional: She is oriented to person, place, and time. She appears well-developed and well-nourished. No distress.  Cardiovascular: Normal rate, regular rhythm and normal heart sounds.  Pulmonary/Chest: Effort normal and breath sounds normal.  Abdominal: There is no tenderness.  Genitourinary:  Genitourinary Comments: Without pelvic pain, denies lesions or sores; gu deferred   Neurological: She is  alert and oriented to person, place, and time.  Skin: Skin is warm and dry.     UC Treatments / Results  Labs (all labs ordered are listed, but only abnormal results are displayed) Labs Reviewed  POCT URINALYSIS DIP (DEVICE) - Abnormal; Notable for the following components:      Result Value   Hgb urine dipstick MODERATE (*)    Leukocytes, UA TRACE (*)    All other components within normal limits  URINE CULTURE  POCT PREGNANCY, URINE  URINE CYTOLOGY ANCILLARY ONLY    EKG  EKG Interpretation None       Radiology No results found.  Procedures Procedures (including critical care time)  Medications Ordered in UC Medications - No data to display   Initial Impression / Assessment and Plan / UC Course  I have reviewed the triage vital signs and the nursing notes.  Pertinent labs & imaging results that were available during my care of the patient were reviewed by me and considered in my medical decision making (see chart for details).     Urine cytology pending, will initiate yeast and bv treatment at this time. Urine sent for culture. Noted historically has had blood to urine, unknown source. Non toxic in appearance. Negative pregnancy, history of tubal ligation. Without vaginal bleeding. Encouraged establish and follow up with pcp and/or gynecologist for continued management of symptoms. Return precautions provided. Patient verbalized understanding and agreeable to plan.    Final Clinical Impressions(s) / UC Diagnoses   Final diagnoses:  Vaginal discharge    ED Discharge Orders        Ordered    metroNIDAZOLE (FLAGYL) 500 MG tablet  2 times daily     11/21/17 1219    fluconazole (DIFLUCAN) 200 MG tablet   Once     11/21/17 1219       Controlled Substance Prescriptions [place] Controlled Substance Registry consulted? Not Applicable   Georgetta Haber, NP 11/21/17 1226

## 2017-11-22 LAB — URINE CYTOLOGY ANCILLARY ONLY
CHLAMYDIA, DNA PROBE: NEGATIVE
Neisseria Gonorrhea: NEGATIVE
Trichomonas: NEGATIVE

## 2017-11-23 ENCOUNTER — Encounter (HOSPITAL_COMMUNITY): Payer: Self-pay | Admitting: Emergency Medicine

## 2017-11-23 ENCOUNTER — Emergency Department (HOSPITAL_COMMUNITY)
Admission: EM | Admit: 2017-11-23 | Discharge: 2017-11-23 | Disposition: A | Discharge status: Home / Self Care | Payer: Self-pay | Attending: Emergency Medicine | Admitting: Emergency Medicine

## 2017-11-23 DIAGNOSIS — M545 Low back pain: Secondary | ICD-10-CM | POA: Insufficient documentation

## 2017-11-23 DIAGNOSIS — M7918 Myalgia, other site: Secondary | ICD-10-CM

## 2017-11-23 DIAGNOSIS — N898 Other specified noninflammatory disorders of vagina: Secondary | ICD-10-CM | POA: Insufficient documentation

## 2017-11-23 DIAGNOSIS — R1030 Lower abdominal pain, unspecified: Secondary | ICD-10-CM | POA: Insufficient documentation

## 2017-11-23 DIAGNOSIS — R102 Pelvic and perineal pain: Secondary | ICD-10-CM | POA: Insufficient documentation

## 2017-11-23 DIAGNOSIS — Z79899 Other long term (current) drug therapy: Secondary | ICD-10-CM | POA: Insufficient documentation

## 2017-11-23 LAB — WET PREP, GENITAL
Sperm: NONE SEEN
Trich, Wet Prep: NONE SEEN
Yeast Wet Prep HPF POC: NONE SEEN

## 2017-11-23 LAB — COMPREHENSIVE METABOLIC PANEL
ALBUMIN: 3.6 g/dL (ref 3.5–5.0)
ALT: 15 U/L (ref 14–54)
AST: 18 U/L (ref 15–41)
Alkaline Phosphatase: 92 U/L (ref 38–126)
Anion gap: 8 (ref 5–15)
BUN: 10 mg/dL (ref 6–20)
CO2: 23 mmol/L (ref 22–32)
Calcium: 8.9 mg/dL (ref 8.9–10.3)
Chloride: 105 mmol/L (ref 101–111)
Creatinine, Ser: 0.89 mg/dL (ref 0.44–1.00)
GFR calc Af Amer: >60 mL/min (ref >60)
GFR calc non Af Amer: >60 mL/min (ref >60)
GLUCOSE: 116 mg/dL — AB (ref 65–99)
POTASSIUM: 3.9 mmol/L (ref 3.5–5.1)
SODIUM: 136 mmol/L (ref 135–145)
TOTAL PROTEIN: 7 g/dL (ref 6.5–8.1)
Total Bilirubin: 0.5 mg/dL (ref 0.3–1.2)

## 2017-11-23 LAB — URINALYSIS, ROUTINE W REFLEX MICROSCOPIC
Bilirubin Urine: NEGATIVE
Glucose, UA: NEGATIVE mg/dL
KETONES UR: NEGATIVE mg/dL
NITRITE: NEGATIVE
PROTEIN: NEGATIVE mg/dL
Specific Gravity, Urine: 1.013 (ref 1.005–1.030)
pH: 7 (ref 5.0–8.0)

## 2017-11-23 LAB — I-STAT BETA HCG BLOOD, ED (MC, WL, AP ONLY)

## 2017-11-23 LAB — URINE CULTURE

## 2017-11-23 LAB — CBC
HEMATOCRIT: 34.9 % — AB (ref 36.0–46.0)
HEMOGLOBIN: 11.1 g/dL — AB (ref 12.0–15.0)
MCH: 27.4 pg (ref 26.0–34.0)
MCHC: 31.8 g/dL (ref 30.0–36.0)
MCV: 86.2 fL (ref 78.0–100.0)
Platelets: 300 10*3/uL (ref 150–400)
RBC: 4.05 MIL/uL (ref 3.87–5.11)
RDW: 13.8 % (ref 11.5–15.5)
WBC: 4.9 10*3/uL (ref 4.0–10.5)

## 2017-11-23 LAB — LIPASE, BLOOD: Lipase: 25 U/L (ref 11–51)

## 2017-11-23 MED ORDER — LIDOCAINE HCL (PF) 1 % IJ SOLN
INTRAMUSCULAR | Status: AC
  Administered 2017-11-23: 5 mL
  Filled 2017-11-23: qty 5

## 2017-11-23 MED ORDER — CEFTRIAXONE SODIUM 250 MG IJ SOLR
250.0000 mg | Freq: Once | INTRAMUSCULAR | Status: AC
  Administered 2017-11-23: 250 mg via INTRAMUSCULAR
  Filled 2017-11-23: qty 250

## 2017-11-23 MED ORDER — AZITHROMYCIN 250 MG PO TABS
1000.0000 mg | ORAL_TABLET | Freq: Once | ORAL | Status: AC
  Administered 2017-11-23: 1000 mg via ORAL
  Filled 2017-11-23: qty 4

## 2017-11-23 NOTE — ED Triage Notes (Signed)
[facility] ED [facility]  States she has been having lower back pain, abdominal pain and discharge x 1 year.  Patient states she is supposed to be taking carafate, starting having reflux issues because of drinking alcohol recently.  States she restarted the carafate recently.  Patient states she is having sore/tender nipples, with radiation to her lower back.  Her biggest concern is that her bilateral legs have been cramping and tense in the past few days.  Patient states she has not had her cycle yet this month.

## 2017-11-23 NOTE — Discharge Instructions (Signed)
Please read attached information. If you experience any new or worsening signs or symptoms please return to the emergency room for evaluation. Please follow-up with your primary care provider or specialist as discussed. Please use medication prescribed only as directed and discontinue taking if you have any concerning signs or symptoms.   °

## 2017-11-23 NOTE — ED Provider Notes (Signed)
[facility]   CSN: [id number] Arrival date & time: 11/23/17  1033     History   Chief Complaint Chief Complaint  Patient presents with  . Abdominal Pain    HPI Brizeyda Lanna PocheJ Kinoshita is a 37 y.o. female.  HPI   37 year old female presents today with numerous complaints.  Patient notes she has had ongoing vaginal discharge.  She was seen in urgent care and diagnosed with BV without physical exam or testing.  She is placed on fluconazole and metronidazole.  Patient denies any significant pelvic pain, vaginal bleeding.  Patient notes some vague lower abdominal pain and lower back pain as well.  She denies any loss of distal sensation strength or motor function.  Patient also notes bilateral anterior thigh pain cramping in nature coming and going better with movement, resolved with eating mustard.  No neurological deficits.  Patient denies any dysuria or change in color or clarity or characteristics of her urine.  Patient reports she is sexually active.  No upper abdominal pain.   Past Medical History:  Diagnosis Date  . Herpes 2002  . Infection   . Trichimoniasis [id number]    Patient Active Problem List   Diagnosis Date Noted  . h/o postpartum BTL  12/13/2011  . S/P ectopic pregnancy 12/13/2011    Past Surgical History:  Procedure Laterality Date  . DILATION AND CURETTAGE OF UTERUS    . LAPAROSCOPY  11/27/2011   Procedure: LAPAROSCOPY OPERATIVE;  Surgeon: Catalina AntiguaPeggy Constant, MD;  Location: [facility];  Service: Gynecology;  Laterality: Bilateral;  . SALPINGECTOMY     Bilateral for twin ectopic preg.   . TUBAL LIGATION  12-05-2010    OB History    Gravida Para Term Preterm AB Living   7 5     2 5    SAB TAB Ectopic Multiple Live Births     1 1   1        Home Medications    Prior to Admission medications   Medication Sig Start Date End Date Taking? Authorizing Provider  metroNIDAZOLE (FLAGYL) 500 MG tablet Take 1 tablet (500 mg  total) by mouth 2 (two) times daily for 7 days. 11/21/17 11/28/17  Georgetta HaberBurky, Natalie B, NP  pantoprazole (PROTONIX) 20 MG tablet Take 1 tablet (20 mg total) by mouth daily. 06/04/17   Aviva SignsWilliams, Marie L, CNM  sucralfate (CARAFATE) 1 g tablet Take 1 tablet (1 g total) by mouth 4 (four) times daily -  with meals and at bedtime. 05/16/17   Ward, Chase PicketJaime Pilcher, PA-C    Family History Family History  Problem Relation Age of Onset  . Anesthesia problems Neg Hx     Social History Social History   Tobacco Use  . Smoking status: Never Smoker  . Smokeless tobacco: Never Used  Substance Use Topics  . Alcohol use: No  . Drug use: No     Allergies   Patient has no known allergies.   Review of Systems Review of Systems  All other systems reviewed and are negative.    Physical Exam Updated Vital Signs BP 108/78 (BP Location: Right Arm)   Pulse 85   Temp 99.1 F (37.3 C) (Oral)   Resp 16   LMP 10/17/2017   SpO2 100%   Physical Exam  Constitutional: She is oriented to person, place, and time. She appears well-developed and well-nourished.  HENT:  Head: Normocephalic and atraumatic.  Eyes: Conjunctivae are normal. Pupils are equal, round, and reactive  to light. Right eye exhibits no discharge. Left eye exhibits no discharge. No scleral icterus.  Neck: Normal range of motion. No JVD present. No tracheal deviation present.  Pulmonary/Chest: Effort normal. No stridor.  Abdominal: Soft. She exhibits no distension and no mass. There is no tenderness. There is no rebound and no guarding. No hernia.  Genitourinary:  Genitourinary Comments: Minimal white vaginal discharge in vaginal vault, no cervical motion tenderness, no adnexal tenderness or masses no bleeding  Musculoskeletal:  No swelling or edema noted to the bilateral lower extremities, full active range of motion sensation strength and motor function intact.  No CT or L-spine tenderness, minor tenderness palpation of left lateral lumbar  musculature  Neurological: She is alert and oriented to person, place, and time. Coordination normal.  Psychiatric: She has a normal mood and affect. Her behavior is normal. Judgment and thought content normal.  Nursing note and vitals reviewed.    ED Treatments / Results  Labs (all labs ordered are listed, but only abnormal results are displayed) Labs Reviewed  WET PREP, GENITAL - Abnormal; Notable for the following components:      Result Value   Clue Cells Wet Prep HPF POC PRESENT (*)    WBC, Wet Prep HPF POC MANY (*)    All other components within normal limits  COMPREHENSIVE METABOLIC PANEL - Abnormal; Notable for the following components:   Glucose, Bld 116 (*)    All other components within normal limits  CBC - Abnormal; Notable for the following components:   Hemoglobin 11.1 (*)    HCT 34.9 (*)    All other components within normal limits  URINALYSIS, ROUTINE W REFLEX MICROSCOPIC - Abnormal; Notable for the following components:   Hgb urine dipstick MODERATE (*)    Leukocytes, UA TRACE (*)    Bacteria, UA RARE (*)    Squamous Epithelial / LPF 0-5 (*)    All other components within normal limits  LIPASE, BLOOD  I-STAT BETA HCG BLOOD, ED ([facility], [facility], [facility] ONLY)  GC/CHLAMYDIA PROBE AMP ([facility]) NOT AT [facility]    EKG  EKG Interpretation None       Radiology No results found.  Procedures Procedures (including critical care time)  Medications Ordered in ED Medications  cefTRIAXone (ROCEPHIN) injection 250 mg (250 mg Intramuscular Given 11/23/17 1629)  azithromycin (ZITHROMAX) tablet 1,000 mg (1,000 mg Oral Given 11/23/17 1629)  lidocaine (PF) (XYLOCAINE) 1 % injection (5 mLs  Given 11/23/17 1629)     Initial Impression / Assessment and Plan / ED Course  I have reviewed the triage vital signs and the nursing notes.  Pertinent labs & imaging results that were available during my care of the patient were reviewed by me and considered in my medical decision making  (see chart for details).     Final Clinical Impressions(s) / ED Diagnoses   Final diagnoses:  Vaginal discharge  Musculoskeletal pain    Labs: Wet prep, i-STAT beta-hCG, urinalysis, lipase, CMP-clue cells, gonorrhea, chlamydia  Imaging:  Consults:  Therapeutics: Ceftriaxone, azithromycin  Discharge Meds:    Assessment/Plan: 37 year old female presents today with vaginal discharge.  Patient treated prophylactically for STDs, no signs of PID.  Abdomen soft nontender.  Patient with likely musculoskeletal pain.  Laboratory analysis reassuring here.  Patient encouraged to follow-up as an outpatient strict return precautions given.  Verbalized understanding and agreement to today's plan had no further questions or concerns at time of discharge.     ED Discharge Orders  None       Eyvonne Mechanic, PA-C 11/23/17 1656    Rolland Porter, MD 11/24/17 2202

## 2017-11-23 NOTE — ED Notes (Signed)
Declined W/C at D/C and was escorted to lobby by RN. 

## 2017-11-23 NOTE — Medical Student Note (Incomplete)
[facility]-EMERGENCY DEPT Provider Student Note For educational purposes for Medical, PA and NP students only and not part of the legal medical record.   CSN: [id number] Arrival date & time: 11/23/17  1033     History   Chief Complaint Chief Complaint  Patient presents with  . Abdominal Pain    HPI Theresa Mooney is a 37 y.o. female who presents with low back pain and bilateral leg cramps that started Monday. 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 Patient states it feels similar to numbness she gets when sitting too long. Reports no recent trauma. She has no difficulty walking or strength deficits. She does report urinary frequency but no dysuria or gross hematuria. She was recently treated for BV at [facility] with metronidazole and fluconazole.  HPI  Past Medical History:  Diagnosis Date  . Herpes 2002  . Infection   . Trichimoniasis [phone]    Patient Active Problem List   Diagnosis Date Noted  . h/o postpartum BTL  12/13/2011  . S/P ectopic pregnancy 12/13/2011    Past Surgical History:  Procedure Laterality Date  . DILATION AND CURETTAGE OF UTERUS    . LAPAROSCOPY  11/27/2011   Procedure: LAPAROSCOPY OPERATIVE;  Surgeon: Catalina Antigua, MD;  Location: [facility];  Service: Gynecology;  Laterality: Bilateral;  . SALPINGECTOMY     Bilateral for twin ectopic preg.   . TUBAL LIGATION  12-05-2010    OB History    Gravida Para Term Preterm AB Living   7 5     2 5    SAB TAB Ectopic Multiple Live Births     1 1   1        Home Medications    Prior to Admission medications   Medication Sig Start Date End Date Taking? Authorizing Provider  metroNIDAZOLE (FLAGYL) 500 MG tablet Take 1 tablet (500 mg total) by mouth 2 (two) times daily for 7 days. 11/21/17 11/28/17  Georgetta Haber, NP  pantoprazole (PROTONIX) 20 MG tablet Take 1 tablet (20 mg total) by mouth  daily. 06/04/17   Aviva Signs, CNM  sucralfate (CARAFATE) 1 g tablet Take 1 tablet (1 g total) by mouth 4 (four) times daily -  with meals and at bedtime. 05/16/17   Ward, Chase Picket, PA-C    Family History Family History  Problem Relation Age of Onset  . Anesthesia problems Neg Hx     Social History Social History   Tobacco Use  . Smoking status: Never Smoker  . Smokeless tobacco: Never Used  Substance Use Topics  . Alcohol use: No  . Drug use: No     Allergies   Patient has no known allergies.   Review of Systems Review of Systems  Constitutional: Negative for activity change, appetite change, chills, diaphoresis, fatigue, fever and unexpected weight change.  Genitourinary: Positive for menstrual problem (She reports her period is very irregular.), urgency and vaginal discharge. Negative for difficulty urinating, dyspareunia, dysuria, flank pain, vaginal bleeding and vaginal pain.  Musculoskeletal: Positive for back pain and myalgias (Patient reports b/l cramping that starts in her thigh and extends down her legs.). Negative for arthralgias.  Neurological: Positive for numbness (Intermittent numbness and tingling that begins in her thighs and extends into her feet. ). Negative for dizziness, weakness, light-headedness and headaches.       Bilateral 5/5 strength, ROM  and reflexes.     Physical Exam Updated Vital Signs BP 108/78 (BP Location: Right Arm)   Pulse 85   Temp 99.1 F (37.3 C) (Oral)   Resp 16   LMP 10/17/2017   SpO2 100%   Physical Exam  Constitutional: She appears well-developed and well-nourished.  Non-toxic appearance. She does not appear ill.  HENT:  Head: Normocephalic and atraumatic.  Pulmonary/Chest: Effort normal and breath sounds normal.  Abdominal: Soft. Normal appearance. There is no hepatosplenomegaly. There is no tenderness. There is no rebound, no guarding, no tenderness at McBurney's point and negative Murphy's sign.  Genitourinary:  No bleeding in the vagina. Vaginal discharge found.    Neuro:   ED Treatments / Results  Labs (all labs ordered are listed, but only abnormal results are displayed) Labs Reviewed  COMPREHENSIVE METABOLIC PANEL - Abnormal; Notable for the following components:      Result Value   Glucose, Bld 116 (*)    All other components within normal limits  CBC - Abnormal; Notable for the following components:   Hemoglobin 11.1 (*)    HCT 34.9 (*)    All other components within normal limits  URINALYSIS, ROUTINE W REFLEX MICROSCOPIC - Abnormal; Notable for the following components:   Hgb urine dipstick MODERATE (*)    Leukocytes, UA TRACE (*)    Bacteria, UA RARE (*)    Squamous Epithelial / LPF 0-5 (*)    All other components within normal limits  LIPASE, BLOOD  I-STAT BETA HCG BLOOD, ED ([facility], [facility], [facility] ONLY)    EKG  EKG Interpretation None       Radiology No results found.  Procedures Procedures (including critical care time)  Medications Ordered in ED Medications - No data to display   Initial Impression / Assessment and Plan / ED Course  I have reviewed the triage vital signs and the nursing notes.  Pertinent labs & imaging results that were available during my care of the patient were reviewed by me and considered in my medical decision making (see chart for details).     ***  Final Clinical Impressions(s) / ED Diagnoses   Final diagnoses:  None    New Prescriptions New Prescriptions   No medications on file

## 2017-11-24 LAB — GC/CHLAMYDIA PROBE AMP (~~LOC~~) NOT AT ARMC
Chlamydia: NEGATIVE
Neisseria Gonorrhea: NEGATIVE

## 2017-11-24 LAB — URINE CYTOLOGY ANCILLARY ONLY: CANDIDA VAGINITIS: NEGATIVE

## 2017-11-27 ENCOUNTER — Emergency Department (HOSPITAL_COMMUNITY): Payer: Self-pay

## 2017-11-27 ENCOUNTER — Encounter (HOSPITAL_COMMUNITY): Payer: Self-pay | Admitting: *Deleted

## 2017-11-27 ENCOUNTER — Emergency Department (HOSPITAL_COMMUNITY)
Admission: EM | Admit: 2017-11-27 | Discharge: 2017-11-27 | Disposition: A | Discharge status: Home / Self Care | Payer: Self-pay | Attending: Emergency Medicine | Admitting: Emergency Medicine

## 2017-11-27 DIAGNOSIS — R1013 Epigastric pain: Secondary | ICD-10-CM | POA: Insufficient documentation

## 2017-11-27 DIAGNOSIS — R109 Unspecified abdominal pain: Secondary | ICD-10-CM

## 2017-11-27 DIAGNOSIS — R1032 Left lower quadrant pain: Secondary | ICD-10-CM | POA: Insufficient documentation

## 2017-11-27 LAB — URINALYSIS, ROUTINE W REFLEX MICROSCOPIC
Bilirubin Urine: NEGATIVE
Glucose, UA: NEGATIVE mg/dL
KETONES UR: NEGATIVE mg/dL
NITRITE: NEGATIVE
PROTEIN: NEGATIVE mg/dL
Specific Gravity, Urine: 1.014 (ref 1.005–1.030)
pH: 5 (ref 5.0–8.0)

## 2017-11-27 LAB — CBC WITH DIFFERENTIAL/PLATELET
BASOS ABS: 0 10*3/uL (ref 0.0–0.1)
Basophils Relative: 0 %
Eosinophils Absolute: 0 10*3/uL (ref 0.0–0.7)
Eosinophils Relative: 1 %
HEMATOCRIT: 37.6 % (ref 36.0–46.0)
Hemoglobin: 12 g/dL (ref 12.0–15.0)
LYMPHS ABS: 2.8 10*3/uL (ref 0.7–4.0)
LYMPHS PCT: 51 %
MCH: 27.1 pg (ref 26.0–34.0)
MCHC: 31.9 g/dL (ref 30.0–36.0)
MCV: 85.1 fL (ref 78.0–100.0)
MONO ABS: 0.5 10*3/uL (ref 0.1–1.0)
Monocytes Relative: 8 %
NEUTROS ABS: 2.3 10*3/uL (ref 1.7–7.7)
Neutrophils Relative %: 40 %
Platelets: 306 10*3/uL (ref 150–400)
RBC: 4.42 MIL/uL (ref 3.87–5.11)
RDW: 13.6 % (ref 11.5–15.5)
WBC: 5.6 10*3/uL (ref 4.0–10.5)

## 2017-11-27 LAB — COMPREHENSIVE METABOLIC PANEL
ALBUMIN: 4 g/dL (ref 3.5–5.0)
ALT: 14 U/L (ref 14–54)
ANION GAP: 9 (ref 5–15)
AST: 17 U/L (ref 15–41)
Alkaline Phosphatase: 93 U/L (ref 38–126)
BILIRUBIN TOTAL: 0.5 mg/dL (ref 0.3–1.2)
BUN: 13 mg/dL (ref 6–20)
CHLORIDE: 105 mmol/L (ref 101–111)
CO2: 23 mmol/L (ref 22–32)
Calcium: 9.1 mg/dL (ref 8.9–10.3)
Creatinine, Ser: 0.88 mg/dL (ref 0.44–1.00)
GFR calc Af Amer: >60 mL/min (ref >60)
GFR calc non Af Amer: >60 mL/min (ref >60)
GLUCOSE: 94 mg/dL (ref 65–99)
POTASSIUM: 3.8 mmol/L (ref 3.5–5.1)
SODIUM: 137 mmol/L (ref 135–145)
TOTAL PROTEIN: 7.8 g/dL (ref 6.5–8.1)

## 2017-11-27 LAB — POC URINE PREG, ED: PREG TEST UR: NEGATIVE

## 2017-11-27 MED ORDER — PANTOPRAZOLE SODIUM 20 MG PO TBEC: 20.0000 mg | DELAYED_RELEASE_TABLET | Freq: Every day | ORAL | 0 refills | Status: DC

## 2017-11-27 MED ORDER — IBUPROFEN 800 MG PO TABS: 800.0000 mg | ORAL_TABLET | Freq: Three times a day (TID) | ORAL | 0 refills | Status: DC

## 2017-11-27 MED ORDER — SUCRALFATE 1 G PO TABS: 1.0000 g | ORAL_TABLET | Freq: Three times a day (TID) | ORAL | 0 refills | Status: DC

## 2017-11-27 NOTE — Discharge Instructions (Signed)

## 2017-11-27 NOTE — ED Notes (Signed)
Bed: WA20 Expected date:  Expected time:  Means of arrival:  Comments: 

## 2017-11-27 NOTE — ED Triage Notes (Signed)
Pt complains of left flank since last night, upper abdomen x 2 weeks, lower back pain that is worse when standing. Pt denies n/v/d. Pt states she feels pressure in lower abdomen while urinating. Pt is currently taking flagyl for BV.

## 2017-11-27 NOTE — ED Provider Notes (Signed)
Emergency Department Provider Note   I have reviewed the triage vital signs and the nursing notes.   HISTORY  Chief Complaint Flank Pain; Abdominal Pain; and Back Pain   HPI Theresa Mooney is a 37 y.o. female with PMH of BV and STD presents to the emergency department for evaluation of continued epigastric abdominal pain made worse with eating along with new onset left flank pain starting yesterday.  Patient states that her epigastric pain has been ongoing for the last week.  In the past few taking Carafate which improved her symptoms but states they have returned.  They are worse with eating greasy foods.  Yesterday she began having intermittent left flank pain which she describes as severe and slightly radiating from the back to the side of the left abdomen.  No history of kidney stones.  Denies any dysuria, hesitancy, urgency.  Denies hematuria.  She does have some mild discharge but is currently being treated for bacterial vaginosis.  She went to urgent care and was swabbed for STDs and told those were negative.  Denies any nausea, vomiting, diarrhea.  Symptoms not improved with over-the-counter medications.   Past Medical History:  Diagnosis Date  . Herpes 2002  . Infection   . Trichimoniasis [phone]    Patient Active Problem List   Diagnosis Date Noted  . h/o postpartum BTL  12/13/2011  . S/P ectopic pregnancy 12/13/2011    Past Surgical History:  Procedure Laterality Date  . DILATION AND CURETTAGE OF UTERUS    . LAPAROSCOPY  11/27/2011   Procedure: LAPAROSCOPY OPERATIVE;  Surgeon: Catalina Antigua, MD;  Location: [facility];  Service: Gynecology;  Laterality: Bilateral;  . SALPINGECTOMY     Bilateral for twin ectopic preg.   . TUBAL LIGATION  12-05-2010    Current Outpatient Rx  . Order #: [id number] Class: Historical Med  . Order #: [id number] Class: Normal  . Order #: [id number] Class: Print  . Order #: [id number] Class: Print  . Order #: [id number] Class: Print     Allergies Patient has no known allergies.  Family History  Problem Relation Age of Onset  . Anesthesia problems Neg Hx     Social History Social History   Tobacco Use  . Smoking status: Never Smoker  . Smokeless tobacco: Never Used  Substance Use Topics  . Alcohol use: No  . Drug use: No    Review of Systems  Constitutional: No fever/chills Eyes: No visual changes. ENT: No sore throat. Cardiovascular: Denies chest pain. Respiratory: Denies shortness of breath. Gastrointestinal: Positive epigastric and left sided abdominal pain.  No nausea, no vomiting.  No diarrhea.  No constipation. Genitourinary: Negative for dysuria. Musculoskeletal: Negative for back pain. Skin: Negative for rash. Neurological: Negative for headaches, focal weakness or numbness.  10-point ROS otherwise negative.  ____________________________________________   PHYSICAL EXAM:  VITAL SIGNS: ED Triage Vitals [11/27/17 0916]  Enc Vitals Group     BP (!) 127/101     Pulse Rate 79     Resp 18     Temp 98.1 F (36.7 C)     Temp Source Oral     SpO2 100 %     Weight 203 lb (92.1 kg)     Height 4\' 11"  (1.499 m)   Constitutional: Alert and oriented. Well appearing and in no acute distress. Eyes: Conjunctivae are normal. Head: Atraumatic. Nose: No congestion/rhinnorhea. Mouth/Throat: Mucous membranes are moist.  Neck: No stridor. Cardiovascular: Normal rate, regular rhythm. Good peripheral circulation. Grossly normal heart sounds.  Respiratory: Normal respiratory effort.  No retractions. Lungs CTAB. Gastrointestinal: Soft and nontender. No distention. Very mild left CVA tenderness.  Musculoskeletal: No lower extremity tenderness nor edema. No gross deformities of extremities. Neurologic:  Normal speech and language. No gross focal neurologic deficits are appreciated.  Skin:  Skin is warm, dry and intact. No rash noted.  ____________________________________________   LABS (all labs  ordered are listed, but only abnormal results are displayed)  Labs Reviewed  URINALYSIS, ROUTINE W REFLEX MICROSCOPIC - Abnormal; Notable for the following components:      Result Value   Hgb urine dipstick LARGE (*)    Leukocytes, UA TRACE (*)    Bacteria, UA FEW (*)    Squamous Epithelial / LPF 0-5 (*)    All other components within normal limits  COMPREHENSIVE METABOLIC PANEL  CBC WITH DIFFERENTIAL/PLATELET  POC URINE PREG, ED   ____________________________________________  RADIOLOGY  Ct Renal Stone Study  Result Date: 11/27/2017 CLINICAL DATA:  Upper abdominal pain x2 weeks, left flank pain since last night, low back pain that is worse when standing. EXAM: CT ABDOMEN AND PELVIS WITHOUT CONTRAST TECHNIQUE: Multidetector CT imaging of the abdomen and pelvis was performed following the standard protocol without IV contrast. COMPARISON:  None. FINDINGS: Lower chest: Lung bases are clear. Hepatobiliary: Unenhanced liver is unremarkable. Gallbladder is unremarkable. No intrahepatic or extrahepatic ductal dilatation. Pancreas: Within normal limits. Spleen: Within normal limits. Adrenals/Urinary Tract: Adrenal glands within normal limits. Six nonobstructing right renal calculi measuring up to 3 mm. Five nonobstructing left renal calculi measuring up to 3 mm. No ureteral or bladder calculi.  No hydronephrosis. Bladder is within normal limits. Stomach/Bowel: Stomach is notable for a tiny hiatal hernia. No evidence of bowel obstruction. Normal appendix (series 2/image 50). Vascular/Lymphatic: No evidence of abdominal aortic aneurysm. No suspicious abdominopelvic lymphadenopathy. Reproductive: Uterus is grossly unremarkable. Bilateral ovaries are within normal limits. Other: No abdominopelvic ascites. Tiny fat containing bilateral inguinal hernias (series 2/image 67). Musculoskeletal: Visualized osseous structures are within normal limits. IMPRESSION: Multiple bilateral nonobstructing renal calculi  measuring up to 3 mm. No ureteral or bladder calculi.  No hydronephrosis. Otherwise, no CT findings to account for the patient's abdominal pain. Electronically Signed   By: Charline Bills M.D.   On: 11/27/2017 11:26    ____________________________________________   PROCEDURES  Procedure(s) performed:   Procedures  None ____________________________________________   INITIAL IMPRESSION / ASSESSMENT AND PLAN / ED COURSE  Pertinent labs & imaging results that were available during my care of the patient were reviewed by me and considered in my medical decision making (see chart for details).  Patient presents to the emergency department with acute on chronic epigastric discomfort that seems most consistent with gastritis.  Plan to obtain CMP to evaluate biliruben and liver enzymes.  Shunt is not acutely tender over her gallbladder.  In terms of her left flank discomfort this could be related to gastritis pain but also some concern for initial presentation for kidney stone.  Plan for CT renal along with labs and reassess.  01:15 PM CT scan reviewed with bilateral nephrolithiasis but no ureterolithiasis.  No other acute abdominal abnormality on CT.  Labs show blood in the urine but no other acute finding.  Patient had negative STD testing, wet prep, pelvic within the last 5 days.  No evidence of UTI here.  No evidence of pyelonephritis.  Plan for patient to restart her Carafate and Protonix.  I have added Motrin as needed for flank discomfort.  With  bilateral nephrolithiasis she could have passed a small stone causing her flank symptoms but discussed with the patient that we cannot know this for sure.  I did provide contact information for a PCP and outpatient urologist.   At this time, I do not feel there is any life-threatening condition present. I have reviewed and discussed all results (EKG, imaging, lab, urine as appropriate), exam findings with patient. I have reviewed nursing notes and  appropriate previous records.  I feel the patient is safe to be discharged home without further emergent workup. Discussed usual and customary return precautions. Patient and family (if present) verbalize understanding and are comfortable with this plan.  Patient will follow-up with their primary care provider. If they do not have a primary care provider, information for follow-up has been provided to them. All questions have been answered.  ____________________________________________  FINAL CLINICAL IMPRESSION(S) / ED DIAGNOSES  Final diagnoses:  Left flank pain  Epigastric abdominal pain    NEW OUTPATIENT MEDICATIONS STARTED DURING THIS VISIT:  Discharge Medication List as of 11/27/2017  1:14 PM    START taking these medications   Details  ibuprofen (ADVIL,MOTRIN) 800 MG tablet Take 1 tablet (800 mg total) by mouth 3 (three) times daily., Starting Sun 11/27/2017, Print        Note:  This document was prepared using Dragon voice recognition software and may include unintentional dictation errors.  Alona BeneJoshua Lotus Santillo, MD Emergency Medicine    Jobina Maita, Arlyss RepressJoshua G, MD 11/27/17 [phone]

## 2018-01-20 ENCOUNTER — Encounter: Payer: Self-pay | Admitting: Internal Medicine

## 2018-03-08 ENCOUNTER — Ambulatory Visit: Payer: Self-pay | Admitting: Internal Medicine

## 2018-04-19 ENCOUNTER — Other Ambulatory Visit: Payer: Self-pay

## 2018-04-19 ENCOUNTER — Encounter (HOSPITAL_COMMUNITY): Payer: Self-pay | Admitting: Emergency Medicine

## 2018-04-19 ENCOUNTER — Ambulatory Visit (HOSPITAL_COMMUNITY)
Admission: EM | Admit: 2018-04-19 | Discharge: 2018-04-19 | Disposition: A | Discharge status: Home / Self Care | Payer: Self-pay | Attending: Family Medicine | Admitting: Family Medicine

## 2018-04-19 DIAGNOSIS — Z79899 Other long term (current) drug therapy: Secondary | ICD-10-CM | POA: Insufficient documentation

## 2018-04-19 DIAGNOSIS — E782 Mixed hyperlipidemia: Secondary | ICD-10-CM | POA: Insufficient documentation

## 2018-04-19 DIAGNOSIS — N898 Other specified noninflammatory disorders of vagina: Secondary | ICD-10-CM | POA: Insufficient documentation

## 2018-04-19 DIAGNOSIS — Z83438 Family history of other disorder of lipoprotein metabolism and other lipidemia: Secondary | ICD-10-CM | POA: Insufficient documentation

## 2018-04-19 DIAGNOSIS — Z833 Family history of diabetes mellitus: Secondary | ICD-10-CM | POA: Insufficient documentation

## 2018-04-19 DIAGNOSIS — Z8249 Family history of ischemic heart disease and other diseases of the circulatory system: Secondary | ICD-10-CM | POA: Insufficient documentation

## 2018-04-19 MED ORDER — FLUCONAZOLE 150 MG PO TABS: 150.0000 mg | ORAL_TABLET | Freq: Every day | ORAL | 0 refills | Status: DC

## 2018-04-19 MED ORDER — METRONIDAZOLE 500 MG PO TABS: 500.0000 mg | ORAL_TABLET | Freq: Two times a day (BID) | ORAL | 0 refills | Status: DC

## 2018-04-19 NOTE — Discharge Instructions (Addendum)
You were treated empirically for Bacterial vaginitis. Start flagyl as directed. I have also called in diflucan, you can take for yeast if experiencing worsening itching/clumpy vaginal discharge. Cytology sent, you will be contacted with any positive results that requires further treatment. Refrain from sexual activity and alcohol use for the next 7 days. Monitor for any worsening of symptoms, fever, abdominal pain, nausea, vomiting, to follow up for reevaluation.  As discussed, you can use over the counter miralax to help with bowel movement.

## 2018-04-19 NOTE — ED Provider Notes (Signed)
[facility]    CSN: [id number] Arrival date & time: 04/19/18  7829     History   Chief Complaint Chief Complaint  Patient presents with  . Vaginal Discharge    HPI Theresa Mooney is a 37 y.o. female.   37 year old female comes in for 2-week history of vaginal discharge.  States it started right when her cycle started, so she waited for her cycle to end.  She continued to  have discharge afterwards, and tried OTC medication with good relief for vaginal itching, but continues with vaginal odor and mild discharge.  She denies fever, chills, night sweats.  Denies abdominal pain, nausea, vomiting.  Denies urinary symptoms such as frequency, dysuria, hematuria.  She is sexually active with one female partner, occasional condom use.  LMP 04/12/2018.     Past Medical History:  Diagnosis Date  . 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 Elevated glucose   . Herpes 2002  . Infection   . Mixed hyperlipidemia   . Nicotine dependence   . Trichimoniasis 12-2010  . Urinary calculi     Patient Active Problem List   Diagnosis Date Noted  . h/o postpartum BTL  12/13/2011  . S/P ectopic pregnancy 12/13/2011    Past Surgical History:  Procedure Laterality Date  . DILATION AND CURETTAGE OF UTERUS    . LAPAROSCOPY  11/27/2011   Procedure: LAPAROSCOPY OPERATIVE;  Surgeon: Catalina Antigua, MD;  Location: [facility];  Service: Gynecology;  Laterality: Bilateral;  . SALPINGECTOMY     Bilateral for twin ectopic preg.   . TUBAL LIGATION  12-05-2010    OB History    Gravida  7   Para  5   Term      Preterm      AB  2   Living  5     SAB      TAB  1   Ectopic  1   Multiple      Live Births  1            Home Medications    Prior to Admission medications   Medication Sig Start Date End Date Taking? Authorizing Provider  NON FORMULARY OTC monistat medication used.   Yes [provider]  fluconazole (DIFLUCAN) 150 MG tablet Take 1 tablet (150 mg total)  by mouth daily. Take second dose 72 hours later if symptoms still persists. 04/19/18   Cathie Hoops, Gardner Servantes V, PA-C  ibuprofen (ADVIL,MOTRIN) 800 MG tablet Take 1 tablet (800 mg total) by mouth 3 (three) times daily. 11/27/17   Long, Arlyss Repress, MD  metroNIDAZOLE (FLAGYL) 500 MG tablet Take 1 tablet (500 mg total) by mouth 2 (two) times daily. 04/19/18   Cathie Hoops, Hope Holst V, PA-C  pantoprazole (PROTONIX) 20 MG tablet Take 1 tablet (20 mg total) by mouth daily. 11/27/17 12/27/17  Long, Arlyss Repress, MD  sucralfate (CARAFATE) 1 g tablet Take 1 tablet (1 g total) by mouth 4 (four) times daily -  with meals and at bedtime. 11/27/17   Long, Arlyss Repress, MD    Family History Family History  Problem Relation Age of Onset  . Hypertension Mother   . Diabetes Maternal Grandmother   . Heart disease Maternal Grandmother   . Hypertension Maternal Grandmother   . Diabetes Maternal Grandfather   . Heart disease Maternal Grandfather   . Hypertension Maternal Grandfather   . Hyperlipidemia Daughter   . Anesthesia problems Neg Hx     Social History Social History  Tobacco Use  . Smoking status: Never Smoker  . Smokeless tobacco: Never Used  Substance Use Topics  . Alcohol use: Yes    Comment: social  . Drug use: No     Allergies   Patient has no known allergies.   Review of Systems Review of Systems  Reason unable to perform ROS: See HPI as above.     Physical Exam Triage Vital Signs ED Triage Vitals [04/19/18 0830]  Enc Vitals Group     BP      Pulse      Resp      Temp      Temp src      SpO2      Weight      Height      Head Circumference      Peak Flow      Pain Score 0     Pain Loc      Pain Edu?      Excl. in GC?    No data found.  Updated Vital Signs LMP 04/12/2018   Visual Acuity Right Eye Distance:   Left Eye Distance:   Bilateral Distance:    Right Eye Near:   Left Eye Near:    Bilateral Near:     Physical Exam  Constitutional: She is oriented to person, place, and time. She appears  well-developed and well-nourished. No distress.  HENT:  Head: Normocephalic and atraumatic.  Eyes: Pupils are equal, round, and reactive to light. Conjunctivae are normal.  Cardiovascular: Normal rate, regular rhythm and normal heart sounds. Exam reveals no gallop and no friction rub.  No murmur heard. Pulmonary/Chest: Effort normal and breath sounds normal. She has no wheezes. She has no rales.  Abdominal: Soft. Bowel sounds are normal. She exhibits no mass. There is no tenderness. There is no rebound, no guarding and no CVA tenderness.  Neurological: She is alert and oriented to person, place, and time.  Skin: Skin is warm and dry.  Psychiatric: She has a normal mood and affect. Her behavior is normal. Judgment normal.   UC Treatments / Results  Labs (all labs ordered are listed, but only abnormal results are displayed) Labs Reviewed  CERVICOVAGINAL ANCILLARY ONLY    EKG None  Radiology No results found.  Procedures Procedures (including critical care time)  Medications Ordered in UC Medications - No data to display  Initial Impression / Assessment and Plan / UC Course  I have reviewed the triage vital signs and the nursing notes.  Pertinent labs & imaging results that were available during my care of the patient were reviewed by me and considered in my medical decision making (see chart for details).    Patient was treated empirically for BV. Flagyl as directed. Rx of diflucan called into pharmacy, can start if experiencing yeast symptoms. Cytology sent, patient will be contacted with any positive results that require additional treatment. Patient to refrain from sexual activity for the next 7 days. Return precautions given.   Final Clinical Impressions(s) / UC Diagnoses   Final diagnoses:  Vaginal discharge    ED Prescriptions    Medication Sig Dispense Auth. Provider   metroNIDAZOLE (FLAGYL) 500 MG tablet Take 1 tablet (500 mg total) by mouth 2 (two) times daily.  14 tablet Brayant Dorr V, PA-C   fluconazole (DIFLUCAN) 150 MG tablet Take 1 tablet (150 mg total) by mouth daily. Take second dose 72 hours later if symptoms still persists. 2 tablet Belinda FisherYu, Berda Shelvin V, PA-C  Belinda Fisher, PA-C 04/19/18 [phone]

## 2018-04-19 NOTE — ED Triage Notes (Signed)
Vaginal discharge for 2 weeks, odor to discharge

## 2018-04-19 NOTE — ED Notes (Signed)
Walked into room to go over AVS.  Pt was on the phone.  AVS left in room with her until she gets off the phone.

## 2018-04-20 LAB — CERVICOVAGINAL ANCILLARY ONLY
BACTERIAL VAGINITIS: POSITIVE — AB
Candida vaginitis: NEGATIVE
Chlamydia: NEGATIVE
Neisseria Gonorrhea: NEGATIVE
Trichomonas: NEGATIVE

## 2018-05-03 ENCOUNTER — Other Ambulatory Visit: Payer: Self-pay

## 2018-05-03 ENCOUNTER — Emergency Department (HOSPITAL_COMMUNITY)
Admission: EM | Admit: 2018-05-03 | Discharge: 2018-05-04 | Disposition: A | Discharge status: Home / Self Care | Payer: Self-pay | Attending: Emergency Medicine | Admitting: Emergency Medicine

## 2018-05-03 ENCOUNTER — Encounter (HOSPITAL_COMMUNITY): Payer: Self-pay | Admitting: Emergency Medicine

## 2018-05-03 DIAGNOSIS — Z79899 Other long term (current) drug therapy: Secondary | ICD-10-CM | POA: Insufficient documentation

## 2018-05-03 DIAGNOSIS — R1013 Epigastric pain: Secondary | ICD-10-CM | POA: Insufficient documentation

## 2018-05-03 LAB — CBC
HEMATOCRIT: 36.9 % (ref 36.0–46.0)
HEMOGLOBIN: 11.7 g/dL — AB (ref 12.0–15.0)
MCH: 27.7 pg (ref 26.0–34.0)
MCHC: 31.7 g/dL (ref 30.0–36.0)
MCV: 87.4 fL (ref 78.0–100.0)
Platelets: 340 10*3/uL (ref 150–400)
RBC: 4.22 MIL/uL (ref 3.87–5.11)
RDW: 14.2 % (ref 11.5–15.5)
WBC: 6 10*3/uL (ref 4.0–10.5)

## 2018-05-03 LAB — COMPREHENSIVE METABOLIC PANEL
ALT: 21 U/L (ref 0–44)
ANION GAP: 7 (ref 5–15)
AST: 24 U/L (ref 15–41)
Albumin: 3.9 g/dL (ref 3.5–5.0)
Alkaline Phosphatase: 86 U/L (ref 38–126)
BUN: 18 mg/dL (ref 6–20)
CO2: 28 mmol/L (ref 22–32)
Calcium: 9.1 mg/dL (ref 8.9–10.3)
Chloride: 104 mmol/L (ref 98–111)
Creatinine, Ser: 0.89 mg/dL (ref 0.44–1.00)
GFR calc non Af Amer: >60 mL/min (ref >60)
Glucose, Bld: 96 mg/dL (ref 70–99)
Potassium: 3.7 mmol/L (ref 3.5–5.1)
SODIUM: 139 mmol/L (ref 135–145)
Total Bilirubin: 0.3 mg/dL (ref 0.3–1.2)
Total Protein: 7.5 g/dL (ref 6.5–8.1)

## 2018-05-03 LAB — I-STAT BETA HCG BLOOD, ED (MC, WL, AP ONLY): I-stat hCG, quantitative: <5 m[IU]/mL (ref <5)

## 2018-05-03 LAB — LIPASE, BLOOD: Lipase: 28 U/L (ref 11–51)

## 2018-05-03 NOTE — ED Notes (Signed)
Attempted to get blood but was unsuccessful 

## 2018-05-03 NOTE — ED Triage Notes (Signed)
Pt from home with upper abdominal pain that she describes as a "sore spot" rated 8/10. Pt states she has pain that is improved following eating. Pt takes Carafate which is effective in relieving pain. Pt states she ran out of this today.

## 2018-05-04 LAB — URINALYSIS, ROUTINE W REFLEX MICROSCOPIC
BILIRUBIN URINE: NEGATIVE
Glucose, UA: NEGATIVE mg/dL
KETONES UR: NEGATIVE mg/dL
Nitrite: NEGATIVE
PH: 5 (ref 5.0–8.0)
Protein, ur: NEGATIVE mg/dL
Specific Gravity, Urine: 1.027 (ref 1.005–1.030)

## 2018-05-04 MED ORDER — FAMOTIDINE 20 MG PO TABS
20.0000 mg | ORAL_TABLET | Freq: Once | ORAL | Status: AC
  Administered 2018-05-04: 20 mg via ORAL
  Filled 2018-05-04: qty 1

## 2018-05-04 MED ORDER — SUCRALFATE 1 G PO TABS
1.0000 g | ORAL_TABLET | Freq: Once | ORAL | Status: AC
  Administered 2018-05-04: 1 g via ORAL
  Filled 2018-05-04: qty 1

## 2018-05-04 MED ORDER — SUCRALFATE 1 G PO TABS: 1.0000 g | ORAL_TABLET | Freq: Three times a day (TID) | ORAL | 1 refills | Status: DC

## 2018-05-04 MED ORDER — RANITIDINE HCL 150 MG PO TABS: 150.0000 mg | ORAL_TABLET | Freq: Two times a day (BID) | ORAL | 0 refills | Status: DC

## 2018-05-04 NOTE — ED Provider Notes (Signed)
[facility]-EMERGENCY DEPT Provider Note   CSN: [id number] Arrival date & time: 05/03/18  2101     History   Chief Complaint Chief Complaint  Patient presents with  . Abdominal Pain    HPI Theresa Mooney is a 37 y.o. female.  Patient presents for evaluation of upper abdominal pain limited to the epigastrium. She states she is familiar with this pain and has been treated with Carafate regularly until she ran out this morning. Her pain started to flare up after drinking "shots" of alcohol and eating a burger over one week ago. No nausea, vomiting or fever. She is moving her bowels regularly without diarrhea or melena. She has been referred to GI but has been unable to go yet. The pain sometimes radiates into her chest as burning pain. She reports increased belching.  The history is provided by the patient. No language interpreter was used.    Past Medical History:  Diagnosis Date  . 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 Elevated glucose   . Herpes 2002  . Infection   . Mixed hyperlipidemia   . Nicotine dependence   . Trichimoniasis 12-2010  . Urinary calculi     Patient Active Problem List   Diagnosis Date Noted  . h/o postpartum BTL  12/13/2011  . S/P ectopic pregnancy 12/13/2011    Past Surgical History:  Procedure Laterality Date  . DILATION AND CURETTAGE OF UTERUS    . LAPAROSCOPY  11/27/2011   Procedure: LAPAROSCOPY OPERATIVE;  Surgeon: Catalina Antigua, MD;  Location: [facility];  Service: Gynecology;  Laterality: Bilateral;  . SALPINGECTOMY     Bilateral for twin ectopic preg.   . TUBAL LIGATION  12-05-2010     OB History    Gravida  7   Para  5   Term      Preterm      AB  2   Living  5     SAB      TAB  1   Ectopic  1   Multiple      Live Births  1            Home Medications    Prior to Admission medications   Medication Sig Start Date End Date Taking? Authorizing Provider  sucralfate (CARAFATE) 1 g tablet  Take 1 tablet (1 g total) by mouth 4 (four) times daily -  with meals and at bedtime. 11/27/17  Yes Long, Arlyss Repress, MD  fluconazole (DIFLUCAN) 150 MG tablet Take 1 tablet (150 mg total) by mouth daily. Take second dose 72 hours later if symptoms still persists. Patient not taking: Reported on 05/04/2018 04/19/18   Belinda Fisher, PA-C  ibuprofen (ADVIL,MOTRIN) 800 MG tablet Take 1 tablet (800 mg total) by mouth 3 (three) times daily. Patient not taking: Reported on 05/04/2018 11/27/17   Long, Arlyss Repress, MD  metroNIDAZOLE (FLAGYL) 500 MG tablet Take 1 tablet (500 mg total) by mouth 2 (two) times daily. Patient not taking: Reported on 05/04/2018 04/19/18   Belinda Fisher, PA-C  pantoprazole (PROTONIX) 20 MG tablet Take 1 tablet (20 mg total) by mouth daily. Patient not taking: Reported on 05/03/2018 11/27/17 12/27/17  Long, Arlyss Repress, MD    Family History Family History  Problem Relation Age of Onset  . Hypertension Mother   . Diabetes Maternal Grandmother   . Heart disease Maternal Grandmother   . Hypertension Maternal Grandmother   . Diabetes Maternal Grandfather   .  Heart disease Maternal Grandfather   . Hypertension Maternal Grandfather   . Hyperlipidemia Daughter   . Anesthesia problems Neg Hx     Social History Social History   Tobacco Use  . Smoking status: Never Smoker  . Smokeless tobacco: Never Used  Substance Use Topics  . Alcohol use: Yes    Comment: social  . Drug use: No     Allergies   Patient has no known allergies.   Review of Systems Review of Systems  Constitutional: Negative for chills and fever.  HENT: Negative.   Respiratory: Negative.   Cardiovascular: Negative.   Gastrointestinal: Positive for abdominal pain. Negative for blood in stool, constipation, diarrhea, nausea and vomiting.  Genitourinary: Negative.   Musculoskeletal: Negative.   Skin: Negative.   Neurological: Negative.      Physical Exam Updated Vital Signs BP 111/71 (BP Location: Left Arm)   Pulse 90    Temp 98.5 F (36.9 C) (Oral)   Resp 14   LMP 04/12/2018   SpO2 100%   Physical Exam  Constitutional: She appears well-developed and well-nourished.  HENT:  Head: Normocephalic.  Neck: Normal range of motion. Neck supple.  Cardiovascular: Normal rate and regular rhythm.  Pulmonary/Chest: Effort normal and breath sounds normal.  Abdominal: Soft. Bowel sounds are normal. There is tenderness in the epigastric area. There is no rigidity, no rebound and no guarding.  Musculoskeletal: Normal range of motion.  Neurological: She is alert. No cranial nerve deficit.  Skin: Skin is warm and dry. No rash noted.  Psychiatric: She has a normal mood and affect.     ED Treatments / Results  Labs (all labs ordered are listed, but only abnormal results are displayed) Labs Reviewed  CBC - Abnormal; Notable for the following components:      Result Value   Hemoglobin 11.7 (*)    All other components within normal limits  URINALYSIS, ROUTINE W REFLEX MICROSCOPIC - Abnormal; Notable for the following components:   APPearance HAZY (*)    Hgb urine dipstick MODERATE (*)    Leukocytes, UA TRACE (*)    Bacteria, UA RARE (*)    All other components within normal limits  LIPASE, BLOOD  COMPREHENSIVE METABOLIC PANEL  I-STAT BETA HCG BLOOD, ED ([facility], [facility], [facility] ONLY)    EKG None  Radiology No results found.  Procedures Procedures (including critical care time)  Medications Ordered in ED Medications - No data to display   Initial Impression / Assessment and Plan / ED Course  I have reviewed the triage vital signs and the nursing notes.  Pertinent labs & imaging results that were available during my care of the patient were reviewed by me and considered in my medical decision making (see chart for details).     Patient here with epigastric pain that has been an intermittent problem, usually treated with carafate which she ran out of. She has taken Zantac today with some relief. No vomiting. No  melena.  Exam is benign. Minimally tender to epigastrium. She is not currently in pain outside of palpation. VSS. Labs unremarkable.   Will restart her carafate, add regular dosing of Zantac. She can follow up with her doctor until she is able to see GI for regular management of chronic problem.   Final Clinical Impressions(s) / ED Diagnoses   Final diagnoses:  None   1. Epigastric pain  ED Discharge Orders    None       Elpidio Anis, Cordelia Poche 05/05/18 2219  Devoria AlbeKnapp, Iva, MD 05/05/18 2258

## 2018-05-13 ENCOUNTER — Encounter

## 2018-06-08 DIAGNOSIS — Z5321 Procedure and treatment not carried out due to patient leaving prior to being seen by health care provider: Secondary | ICD-10-CM | POA: Insufficient documentation

## 2018-06-08 DIAGNOSIS — R252 Cramp and spasm: Secondary | ICD-10-CM | POA: Insufficient documentation

## 2018-06-09 ENCOUNTER — Other Ambulatory Visit: Payer: Self-pay

## 2018-06-09 ENCOUNTER — Encounter (HOSPITAL_COMMUNITY): Payer: Self-pay

## 2018-06-09 ENCOUNTER — Emergency Department (HOSPITAL_COMMUNITY)
Admission: EM | Admit: 2018-06-09 | Discharge: 2018-06-09 | Disposition: A | Discharge status: Home / Self Care | Payer: Self-pay | Attending: Emergency Medicine | Admitting: Emergency Medicine

## 2018-06-09 ENCOUNTER — Encounter (HOSPITAL_COMMUNITY): Payer: Self-pay | Admitting: *Deleted

## 2018-06-09 DIAGNOSIS — Z79899 Other long term (current) drug therapy: Secondary | ICD-10-CM | POA: Insufficient documentation

## 2018-06-09 DIAGNOSIS — E782 Mixed hyperlipidemia: Secondary | ICD-10-CM | POA: Insufficient documentation

## 2018-06-09 DIAGNOSIS — R1084 Generalized abdominal pain: Secondary | ICD-10-CM | POA: Insufficient documentation

## 2018-06-09 DIAGNOSIS — M791 Myalgia, unspecified site: Secondary | ICD-10-CM | POA: Insufficient documentation

## 2018-06-09 DIAGNOSIS — R319 Hematuria, unspecified: Secondary | ICD-10-CM | POA: Insufficient documentation

## 2018-06-09 DIAGNOSIS — R109 Unspecified abdominal pain: Secondary | ICD-10-CM

## 2018-06-09 LAB — URINALYSIS, ROUTINE W REFLEX MICROSCOPIC
BILIRUBIN URINE: NEGATIVE
Bilirubin Urine: NEGATIVE
GLUCOSE, UA: NEGATIVE mg/dL
Glucose, UA: NEGATIVE mg/dL
Ketones, ur: NEGATIVE mg/dL
Ketones, ur: NEGATIVE mg/dL
Nitrite: NEGATIVE
Nitrite: NEGATIVE
PH: 5 (ref 5.0–8.0)
PROTEIN: NEGATIVE mg/dL
PROTEIN: NEGATIVE mg/dL
Specific Gravity, Urine: 1.018 (ref 1.005–1.030)
Specific Gravity, Urine: 1.02 (ref 1.005–1.030)
pH: 6 (ref 5.0–8.0)

## 2018-06-09 LAB — TSH: TSH: 2.554 u[IU]/mL (ref 0.350–4.500)

## 2018-06-09 LAB — CBC WITH DIFFERENTIAL/PLATELET
Basophils Absolute: 0 10*3/uL (ref 0.0–0.1)
Basophils Relative: 1 %
EOS PCT: 2 %
Eosinophils Absolute: 0.1 10*3/uL (ref 0.0–0.7)
HCT: 36.5 % (ref 36.0–46.0)
Hemoglobin: 11.6 g/dL — ABNORMAL LOW (ref 12.0–15.0)
LYMPHS ABS: 2.1 10*3/uL (ref 0.7–4.0)
LYMPHS PCT: 51 %
MCH: 27.6 pg (ref 26.0–34.0)
MCHC: 31.8 g/dL (ref 30.0–36.0)
MCV: 86.7 fL (ref 78.0–100.0)
MONOS PCT: 10 %
Monocytes Absolute: 0.4 10*3/uL (ref 0.1–1.0)
Neutro Abs: 1.4 10*3/uL — ABNORMAL LOW (ref 1.7–7.7)
Neutrophils Relative %: 36 %
Platelets: 361 10*3/uL (ref 150–400)
RBC: 4.21 MIL/uL (ref 3.87–5.11)
RDW: 13.8 % (ref 11.5–15.5)
WBC: 4 10*3/uL (ref 4.0–10.5)

## 2018-06-09 LAB — COMPREHENSIVE METABOLIC PANEL
ALBUMIN: 4 g/dL (ref 3.5–5.0)
ALT: 16 U/L (ref 0–44)
AST: 18 U/L (ref 15–41)
Alkaline Phosphatase: 99 U/L (ref 38–126)
Anion gap: 9 (ref 5–15)
BUN: 12 mg/dL (ref 6–20)
CHLORIDE: 106 mmol/L (ref 98–111)
CO2: 27 mmol/L (ref 22–32)
Calcium: 9.2 mg/dL (ref 8.9–10.3)
Creatinine, Ser: 0.92 mg/dL (ref 0.44–1.00)
GFR calc Af Amer: >60 mL/min (ref >60)
GFR calc non Af Amer: >60 mL/min (ref >60)
GLUCOSE: 96 mg/dL (ref 70–99)
POTASSIUM: 3.9 mmol/L (ref 3.5–5.1)
SODIUM: 142 mmol/L (ref 135–145)
Total Bilirubin: 0.6 mg/dL (ref 0.3–1.2)
Total Protein: 7.5 g/dL (ref 6.5–8.1)

## 2018-06-09 LAB — POC URINE PREG, ED: PREG TEST UR: NEGATIVE

## 2018-06-09 LAB — MAGNESIUM: Magnesium: 2.2 mg/dL (ref 1.7–2.4)

## 2018-06-09 LAB — PREGNANCY, URINE: Preg Test, Ur: NEGATIVE

## 2018-06-09 MED ORDER — HYDROCODONE-ACETAMINOPHEN 5-325 MG PO TABS: 1.0000 | ORAL_TABLET | ORAL | 0 refills | Status: DC | PRN

## 2018-06-09 MED ORDER — KETOROLAC TROMETHAMINE 30 MG/ML IJ SOLN
30.0000 mg | Freq: Once | INTRAMUSCULAR | Status: AC
  Administered 2018-06-09: 30 mg via INTRAMUSCULAR
  Filled 2018-06-09: qty 1

## 2018-06-09 NOTE — ED Triage Notes (Signed)
patient c/o bilateral leg pain and left flank pain. Patient states she was in the ED last night but had to leave. patient states she was told she had a kidney stone on a previous visit.

## 2018-06-09 NOTE — ED Provider Notes (Signed)
[facility]-EMER[facility] DEPT Provider Note   CSN: [id number] Arrival date & time: 06/09/18  4098     History   Chief Complaint Chief Complaint  Patient presents with  . Flank Pain  . Leg Pain    HPI Theresa Mooney is a 37 y.o. female.  Pt presents to the ED today with multiple complaints.  She has pain in both arms and legs.  She has bilateral back pain.  Pt said sx have been going on for weeks.  She does have a hx of kidney stones.  Pt said the leg pain runs down the tops of her legs when she sits or lies down.  It is not there when she walks.  She denies n/v.  No f/c.  No dysuria.     Past Medical History:  Diagnosis Date  . 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 Elevated glucose   . Herpes 2002  . Infection   . Mixed hyperlipidemia   . Nicotine dependence   . Trichimoniasis 12-2010  . Urinary calculi     Patient Active Problem List   Diagnosis Date Noted  . h/o postpartum BTL  12/13/2011  . S/P ectopic pregnancy 12/13/2011    Past Surgical History:  Procedure Laterality Date  . DILATION AND CURETTAGE OF UTERUS    . LAPAROSCOPY  11/27/2011   Procedure: LAPAROSCOPY OPERATIVE;  Surgeon: Catalina Antigua, MD;  Location: [facility];  Service: Gynecology;  Laterality: Bilateral;  . SALPINGECTOMY     Bilateral for twin ectopic preg.   . TUBAL LIGATION  12-05-2010     OB History    Gravida  7   Para  5   Term      Preterm      AB  2   Living  5     SAB      TAB  1   Ectopic  1   Multiple      Live Births  1            Home Medications    Prior to Admission medications   Medication Sig Start Date End Date Taking? Authorizing Provider  Homeopathic Products (AZO YEAST PLUS PO) Take 1 tablet by mouth daily.   Yes [provider]  ranitidine (ZANTAC) 150 MG tablet Take 1 tablet (150 mg total) by mouth 2 (two) times daily. 05/04/18  Yes Upstill, Melvenia Beam, PA-C  sucralfate (CARAFATE) 1 g tablet Take 1 tablet (1 g total) by  mouth 4 (four) times daily -  with meals and at bedtime. 05/04/18  Yes Upstill, Melvenia Beam, PA-C  fluconazole (DIFLUCAN) 150 MG tablet Take 1 tablet (150 mg total) by mouth daily. Take second dose 72 hours later if symptoms still persists. Patient not taking: Reported on 05/04/2018 04/19/18   Belinda Fisher, PA-C  HYDROcodone-acetaminophen (NORCO/VICODIN) 5-325 MG tablet Take 1 tablet by mouth every 4 (four) hours as needed. 06/09/18   Jacalyn Lefevre, MD  ibuprofen (ADVIL,MOTRIN) 800 MG tablet Take 1 tablet (800 mg total) by mouth 3 (three) times daily. Patient not taking: Reported on 05/04/2018 11/27/17   Long, Arlyss Repress, MD  metroNIDAZOLE (FLAGYL) 500 MG tablet Take 1 tablet (500 mg total) by mouth 2 (two) times daily. Patient not taking: Reported on 05/04/2018 04/19/18   Belinda Fisher, PA-C  pantoprazole (PROTONIX) 20 MG tablet Take 1 tablet (20 mg total) by mouth daily. Patient not taking: Reported on 05/03/2018 11/27/17 12/27/17  Long, Arlyss Repress, MD  Family History Family History  Problem Relation Age of Onset  . Hypertension Mother   . Diabetes Maternal Grandmother   . Heart disease Maternal Grandmother   . Hypertension Maternal Grandmother   . Diabetes Maternal Grandfather   . Heart disease Maternal Grandfather   . Hypertension Maternal Grandfather   . Hyperlipidemia Daughter   . Anesthesia problems Neg Hx     Social History Social History   Tobacco Use  . Smoking status: Never Smoker  . Smokeless tobacco: Never Used  Substance Use Topics  . Alcohol use: Yes    Comment: social  . Drug use: No     Allergies   Patient has no known allergies.   Review of Systems Review of Systems  Gastrointestinal: Positive for abdominal pain.  Musculoskeletal: Positive for back pain and myalgias.  All other systems reviewed and are negative.    Physical Exam Updated Vital Signs BP 137/86 (BP Location: Left Arm)   Pulse 85   Temp 98.5 F (36.9 C) (Oral)   Resp 16   Ht 4\' 11"  (1.499 m)   Wt 93 kg   LMP  05/29/2018   SpO2 100%   BMI 41.40 kg/m   Physical Exam  Constitutional: She is oriented to person, place, and time. She appears well-developed and well-nourished.  HENT:  Head: Normocephalic and atraumatic.  Right Ear: External ear normal.  Left Ear: External ear normal.  Nose: Nose normal.  Mouth/Throat: Oropharynx is clear and moist.  Eyes: Pupils are equal, round, and reactive to light. Conjunctivae and EOM are normal.  Neck: Normal range of motion. Neck supple.  Cardiovascular: Normal rate, regular rhythm, normal heart sounds and intact distal pulses.  Pulmonary/Chest: Effort normal and breath sounds normal.  Abdominal: Soft. Bowel sounds are normal.  Musculoskeletal: Normal range of motion.  Neurological: She is alert and oriented to person, place, and time.  Skin: Skin is warm. Capillary refill takes less than 2 seconds.  Psychiatric: She has a normal mood and affect. Her behavior is normal. Judgment and thought content normal.  Nursing note and vitals reviewed.    ED Treatments / Results  Labs (all labs ordered are listed, but only abnormal results are displayed) Labs Reviewed  URINALYSIS, ROUTINE W REFLEX MICROSCOPIC - Abnormal; Notable for the following components:      Result Value   APPearance HAZY (*)    Hgb urine dipstick LARGE (*)    Leukocytes, UA SMALL (*)    Bacteria, UA RARE (*)    All other components within normal limits  CBC WITH DIFFERENTIAL/PLATELET - Abnormal; Notable for the following components:   Hemoglobin 11.6 (*)    Neutro Abs 1.4 (*)    All other components within normal limits  COMPREHENSIVE METABOLIC PANEL  MAGNESIUM  TSH  POC URINE PREG, ED    EKG None  Radiology No results found.  Procedures Procedures (including critical care time)  Medications Ordered in ED Medications  ketorolac (TORADOL) 30 MG/ML injection 30 mg (30 mg Intramuscular Given 06/09/18 1116)     Initial Impression / Assessment and Plan / ED Course  I  have reviewed the triage vital signs and the nursing notes.  Pertinent labs & imaging results that were available during my care of the patient were reviewed by me and considered in my medical decision making (see chart for details).    Pt is feeling better.  She does have hematuria and flank pain with hx of kidney stones.  No need for ct  renal today.  She is instructed to f/u with urology to ensure her hematuria goes away, otherwise, she will need a cystoscopy.  She knows to return if worse.  Final Clinical Impressions(s) / ED Diagnoses   Final diagnoses:  Flank pain  Hematuria, unspecified type  Myalgia    ED Discharge Orders         Ordered    HYDROcodone-acetaminophen (NORCO/VICODIN) 5-325 MG tablet  Every 4 hours PRN     06/09/18 1235           Jacalyn LefevreHaviland, Raynelle Fujikawa, MD 06/09/18 1242

## 2018-06-09 NOTE — ED Triage Notes (Signed)
Bilateral leg "throbbing, and cramping" that starts in the thighs and goes down to the ankles for about 2-3 days. She is also having "aching" in the arms and legs. No meds PTA. Pt says that she has had bilateral flank pain for about 4-5 weeks with odorous urine.

## 2018-06-09 NOTE — Discharge Instructions (Addendum)
You need to follow up with urology to make sure that the blood in your urine goes away.  If it does not, they will need to look in your bladder.

## 2018-06-11 ENCOUNTER — Encounter (HOSPITAL_COMMUNITY): Payer: Self-pay | Admitting: Emergency Medicine

## 2018-06-11 ENCOUNTER — Emergency Department (HOSPITAL_COMMUNITY)
Admission: EM | Admit: 2018-06-11 | Discharge: 2018-06-11 | Disposition: A | Discharge status: Home / Self Care | Payer: Self-pay | Attending: Emergency Medicine | Admitting: Emergency Medicine

## 2018-06-11 ENCOUNTER — Other Ambulatory Visit: Payer: Self-pay

## 2018-06-11 DIAGNOSIS — R310 Gross hematuria: Secondary | ICD-10-CM | POA: Insufficient documentation

## 2018-06-11 DIAGNOSIS — B9689 Other specified bacterial agents as the cause of diseases classified elsewhere: Secondary | ICD-10-CM | POA: Insufficient documentation

## 2018-06-11 DIAGNOSIS — N939 Abnormal uterine and vaginal bleeding, unspecified: Secondary | ICD-10-CM | POA: Insufficient documentation

## 2018-06-11 DIAGNOSIS — R110 Nausea: Secondary | ICD-10-CM | POA: Insufficient documentation

## 2018-06-11 DIAGNOSIS — N760 Acute vaginitis: Secondary | ICD-10-CM | POA: Insufficient documentation

## 2018-06-11 LAB — URINALYSIS, ROUTINE W REFLEX MICROSCOPIC
Bilirubin Urine: NEGATIVE
GLUCOSE, UA: NEGATIVE mg/dL
Ketones, ur: NEGATIVE mg/dL
Leukocytes, UA: NEGATIVE
Nitrite: NEGATIVE
PH: 7 (ref 5.0–8.0)
Protein, ur: NEGATIVE mg/dL
SPECIFIC GRAVITY, URINE: 1.013 (ref 1.005–1.030)

## 2018-06-11 LAB — WET PREP, GENITAL
Sperm: NONE SEEN
TRICH WET PREP: NONE SEEN
YEAST WET PREP: NONE SEEN

## 2018-06-11 LAB — PREGNANCY, URINE: Preg Test, Ur: NEGATIVE

## 2018-06-11 MED ORDER — ACETAMINOPHEN 500 MG PO TABS
1000.0000 mg | ORAL_TABLET | Freq: Once | ORAL | Status: AC
  Administered 2018-06-11: 1000 mg via ORAL
  Filled 2018-06-11: qty 2

## 2018-06-11 MED ORDER — METRONIDAZOLE 500 MG PO TABS
2000.0000 mg | ORAL_TABLET | Freq: Once | ORAL | Status: AC
  Administered 2018-06-11: 2000 mg via ORAL
  Filled 2018-06-11: qty 4

## 2018-06-11 MED ORDER — KETOROLAC TROMETHAMINE 60 MG/2ML IM SOLN
15.0000 mg | Freq: Once | INTRAMUSCULAR | Status: AC
  Administered 2018-06-11: 15 mg via INTRAMUSCULAR
  Filled 2018-06-11: qty 2

## 2018-06-11 MED ORDER — DOXYCYCLINE HYCLATE 100 MG PO CAPS: 100.0000 mg | ORAL_CAPSULE | Freq: Two times a day (BID) | ORAL | 0 refills | Status: DC

## 2018-06-11 NOTE — ED Notes (Signed)
RN attempted blood draw without success.

## 2018-06-11 NOTE — ED Provider Notes (Signed)
[facility]-EMERGENCY DEPT Provider Note   CSN: [id number] Arrival date & time: 06/11/18  1331     History   Chief Complaint Chief Complaint  Patient presents with  . Nausea  . Vaginal Discharge  . dizziness    HPI Theresa Mooney is a 37 y.o. female.  37 yo F with a chief complaint of pelvic cramping and vaginal bleeding.  This been going on for the past couple days.  She initially was seen in the ED about a week ago for a similar complaint.  She states that she is been having very copious vaginal discharge and she is always diagnosed with bacterial vaginosis and given medicine but comes back.  She denies likelihood of having a sexually transmitted disease.  She denies fevers or chills.  She has been having diffuse muscle aches.  Going on for the past week or so.  Denies cough congestion fevers chills denies nausea vomiting or diarrhea she denies tick exposure.  The history is provided by the patient.  Vaginal Discharge   This is a new problem. The current episode started more than 1 week ago. The problem occurs constantly. The problem has not changed since onset.Associated symptoms include nausea. Pertinent negatives include no fever, no vomiting and no dysuria.    Past Medical History:  Diagnosis Date  . 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 Elevated glucose   . Herpes 2002  . Infection   . Mixed hyperlipidemia   . Nicotine dependence   . Trichimoniasis 12-2010  . Urinary calculi     Patient Active Problem List   Diagnosis Date Noted  . h/o postpartum BTL  12/13/2011  . S/P ectopic pregnancy 12/13/2011    Past Surgical History:  Procedure Laterality Date  . DILATION AND CURETTAGE OF UTERUS    . LAPAROSCOPY  11/27/2011   Procedure: LAPAROSCOPY OPERATIVE;  Surgeon: Catalina AntiguaPeggy Constant, MD;  Location: [facility];  Service: Gynecology;  Laterality: Bilateral;  . SALPINGECTOMY     Bilateral for twin ectopic preg.   . TUBAL LIGATION  12-05-2010     OB  History    Gravida  7   Para  5   Term      Preterm      AB  2   Living  5     SAB      TAB  1   Ectopic  1   Multiple      Live Births  1            Home Medications    Prior to Admission medications   Medication Sig Start Date End Date Taking? Authorizing Provider  HYDROcodone-acetaminophen (NORCO/VICODIN) 5-325 MG tablet Take 1 tablet by mouth every 4 (four) hours as needed. 06/09/18  Yes Jacalyn LefevreHaviland, Julie, MD  doxycycline (VIBRAMYCIN) 100 MG capsule Take 1 capsule (100 mg total) by mouth 2 (two) times daily. One po bid x 7 days 06/11/18   Melene PlanFloyd, Leonetta Mcgivern, DO  fluconazole (DIFLUCAN) 150 MG tablet Take 1 tablet (150 mg total) by mouth daily. Take second dose 72 hours later if symptoms still persists. Patient not taking: Reported on 05/04/2018 04/19/18   Belinda FisherYu, Amy V, PA-C  ibuprofen (ADVIL,MOTRIN) 800 MG tablet Take 1 tablet (800 mg total) by mouth 3 (three) times daily. Patient not taking: Reported on 05/04/2018 11/27/17   Long, Arlyss RepressJoshua G, MD  metroNIDAZOLE (FLAGYL) 500 MG tablet Take 1 tablet (500 mg total) by mouth 2 (two) times daily. Patient not  taking: Reported on 05/04/2018 04/19/18   Belinda Fisher, PA-C  pantoprazole (PROTONIX) 20 MG tablet Take 1 tablet (20 mg total) by mouth daily. Patient not taking: Reported on 05/03/2018 11/27/17 12/27/17  Maia Plan, MD  ranitidine (ZANTAC) 150 MG tablet Take 1 tablet (150 mg total) by mouth 2 (two) times daily. Patient not taking: Reported on 06/11/2018 05/04/18   Elpidio Anis, PA-C  sucralfate (CARAFATE) 1 g tablet Take 1 tablet (1 g total) by mouth 4 (four) times daily -  with meals and at bedtime. Patient not taking: Reported on 06/11/2018 05/04/18   Elpidio Anis, PA-C    Family History Family History  Problem Relation Age of Onset  . Hypertension Mother   . Diabetes Maternal Grandmother   . Heart disease Maternal Grandmother   . Hypertension Maternal Grandmother   . Diabetes Maternal Grandfather   . Heart disease Maternal Grandfather     . Hypertension Maternal Grandfather   . Hyperlipidemia Daughter   . Anesthesia problems Neg Hx     Social History Social History   Tobacco Use  . Smoking status: Never Smoker  . Smokeless tobacco: Never Used  Substance Use Topics  . Alcohol use: Yes    Comment: social  . Drug use: No     Allergies   Patient has no known allergies.   Review of Systems Review of Systems  Constitutional: Negative for chills and fever.  HENT: Negative for congestion and rhinorrhea.   Eyes: Negative for redness and visual disturbance.  Respiratory: Negative for shortness of breath and wheezing.   Cardiovascular: Negative for chest pain and palpitations.  Gastrointestinal: Positive for nausea. Negative for vomiting.  Genitourinary: Positive for hematuria, pelvic pain, vaginal bleeding and vaginal discharge. Negative for dysuria and urgency.  Musculoskeletal: Negative for arthralgias and myalgias.  Skin: Negative for pallor and wound.  Neurological: Negative for dizziness and headaches.     Physical Exam Updated Vital Signs BP 124/87 (BP Location: Left Arm)   Pulse 94   Temp 98.3 F (36.8 C) (Oral)   Resp 16   LMP 05/29/2018   Physical Exam  Constitutional: She is oriented to person, place, and time. She appears well-developed and well-nourished. No distress.  HENT:  Head: Normocephalic and atraumatic.  Eyes: Pupils are equal, round, and reactive to light. EOM are normal.  Neck: Normal range of motion. Neck supple.  Cardiovascular: Normal rate and regular rhythm. Exam reveals no gallop and no friction rub.  No murmur heard. Pulmonary/Chest: Effort normal. She has no wheezes. She has no rales.  Abdominal: Soft. She exhibits no distension and no mass. There is no tenderness. There is no guarding.  Genitourinary:  Genitourinary Comments: Copious vaginal discharge, some erythema to the 12 o'clock position of the cervix.  Musculoskeletal: She exhibits no edema or tenderness.   Neurological: She is alert and oriented to person, place, and time.  Skin: Skin is warm and dry. She is not diaphoretic.  Psychiatric: She has a normal mood and affect. Her behavior is normal.  Nursing note and vitals reviewed.    ED Treatments / Results  Labs (all labs ordered are listed, but only abnormal results are displayed) Labs Reviewed  WET PREP, GENITAL - Abnormal; Notable for the following components:      Result Value   Clue Cells Wet Prep HPF POC PRESENT (*)    WBC, Wet Prep HPF POC MANY (*)    All other components within normal limits  URINALYSIS, ROUTINE W REFLEX  MICROSCOPIC - Abnormal; Notable for the following components:   Hgb urine dipstick MODERATE (*)    Bacteria, UA RARE (*)    All other components within normal limits  PREGNANCY, URINE  RPR  HIV ANTIBODY (ROUTINE TESTING W REFLEX)  GC/CHLAMYDIA PROBE AMP ([place]) NOT AT [facility]    EKG None  Radiology No results found.  Procedures Procedures (including critical care time)  Medications Ordered in ED Medications  metroNIDAZOLE (FLAGYL) tablet 2,000 mg (has no administration in time range)  acetaminophen (TYLENOL) tablet 1,000 mg (1,000 mg Oral Given 06/11/18 1558)  ketorolac (TORADOL) injection 15 mg (15 mg Intramuscular Given 06/11/18 1556)     Initial Impression / Assessment and Plan / ED Course  I have reviewed the triage vital signs and the nursing notes.  Pertinent labs & imaging results that were available during my care of the patient were reviewed by me and considered in my medical decision making (see chart for details).     37 yo F with a chief complaint of pelvic pain and cramping.  Going on for the past week though on further discussion she has had this multiple times in the past so.  She was recently seen in the ED and thought to have kidney stones and was sent to follow-up with urology but she has not yet followed up.   Will perform a pelvic exam if Tylenol and Toradol reassess.  UA  negative for pregnancy.  UA with hematuria, this is been a chronic issue for her.  I discussed with her that she does need to follow-up with urology for further evaluation. No cmt on pelvic exam.  Will treat presumptively with doxy for possible tick borne illness with diffuse arthralgias without other noted source in the summer.    BV on wet prep, will treat. GYN and Urology follow up.  4:20 PM:  I have discussed the diagnosis/risks/treatment options with the patient and believe the pt to be eligible for discharge home to follow-up with PCP, gyn, urology. We also discussed returning to the ED immediately if new or worsening sx occur. We discussed the sx which are most concerning (e.g., sudden worsening pain, fever, inability to tolerate by mouth) that necessitate immediate return. Medications administered to the patient during their visit and any new prescriptions provided to the patient are listed below.  Medications given during this visit Medications  metroNIDAZOLE (FLAGYL) tablet 2,000 mg (has no administration in time range)  acetaminophen (TYLENOL) tablet 1,000 mg (1,000 mg Oral Given 06/11/18 1558)  ketorolac (TORADOL) injection 15 mg (15 mg Intramuscular Given 06/11/18 1556)      The patient appears reasonably screen and/or stabilized for discharge and I doubt any other medical condition or other [facility] requiring further screening, evaluation, or treatment in the ED at this time prior to discharge.    Final Clinical Impressions(s) / ED Diagnoses   Final diagnoses:  BV (bacterial vaginosis)  Gross hematuria    ED Discharge Orders         Ordered    doxycycline (VIBRAMYCIN) 100 MG capsule  2 times daily     06/11/18 1619           Melene Plan, DO 06/11/18 1620

## 2018-06-11 NOTE — ED Triage Notes (Signed)
Pt returns to ED c/o ongoing pain with urination, vaginal discharge with odor. Pt states she is experiencing dizziness today.

## 2018-06-11 NOTE — Discharge Instructions (Signed)
Follow up with your GYN.  Follow up with urology.

## 2018-06-11 NOTE — ED Notes (Signed)
RN and tech attempted blood draw without success. Patient stated that she did not understand why we were testing for HIV. Spoke to patient and explained this to her. Patient states that she does still want these tests done.

## 2018-06-12 ENCOUNTER — Other Ambulatory Visit: Payer: Self-pay

## 2018-06-12 ENCOUNTER — Inpatient Hospital Stay (HOSPITAL_COMMUNITY)
Admission: AD | Admit: 2018-06-12 | Discharge: 2018-06-12 | Disposition: A | Discharge status: Home / Self Care | Payer: Self-pay | Source: Ambulatory Visit | Attending: Obstetrics & Gynecology | Admitting: Obstetrics & Gynecology

## 2018-06-12 ENCOUNTER — Encounter (HOSPITAL_COMMUNITY): Payer: Self-pay | Admitting: *Deleted

## 2018-06-12 DIAGNOSIS — R319 Hematuria, unspecified: Secondary | ICD-10-CM | POA: Insufficient documentation

## 2018-06-12 DIAGNOSIS — M79601 Pain in right arm: Secondary | ICD-10-CM | POA: Insufficient documentation

## 2018-06-12 DIAGNOSIS — M79602 Pain in left arm: Secondary | ICD-10-CM | POA: Insufficient documentation

## 2018-06-12 DIAGNOSIS — R52 Pain, unspecified: Secondary | ICD-10-CM

## 2018-06-12 DIAGNOSIS — E782 Mixed hyperlipidemia: Secondary | ICD-10-CM | POA: Insufficient documentation

## 2018-06-12 DIAGNOSIS — R110 Nausea: Secondary | ICD-10-CM | POA: Insufficient documentation

## 2018-06-12 DIAGNOSIS — M79605 Pain in left leg: Secondary | ICD-10-CM | POA: Insufficient documentation

## 2018-06-12 DIAGNOSIS — M79604 Pain in right leg: Secondary | ICD-10-CM | POA: Insufficient documentation

## 2018-06-12 DIAGNOSIS — Z87442 Personal history of urinary calculi: Secondary | ICD-10-CM | POA: Insufficient documentation

## 2018-06-12 LAB — URINALYSIS, ROUTINE W REFLEX MICROSCOPIC
BACTERIA UA: NONE SEEN
Bilirubin Urine: NEGATIVE
Glucose, UA: NEGATIVE mg/dL
Ketones, ur: 5 mg/dL — AB
NITRITE: NEGATIVE
Protein, ur: NEGATIVE mg/dL
SPECIFIC GRAVITY, URINE: 1.027 (ref 1.005–1.030)
pH: 5 (ref 5.0–8.0)

## 2018-06-12 LAB — GC/CHLAMYDIA PROBE AMP (~~LOC~~) NOT AT ARMC
Chlamydia: NEGATIVE
NEISSERIA GONORRHEA: NEGATIVE

## 2018-06-12 LAB — RPR: RPR Ser Ql: NONREACTIVE

## 2018-06-12 LAB — HIV ANTIBODY (ROUTINE TESTING W REFLEX): HIV Screen 4th Generation wRfx: NONREACTIVE

## 2018-06-12 NOTE — MAU Provider Note (Signed)
History     CSN: [id number]  Arrival date and time: 06/12/18 4098   First Provider Initiated Contact with Patient 06/12/18 1125      Chief Complaint  Patient presents with  . Nausea  . Hematuria  . Arm Pain  . Leg Pain   HPI Theresa Mooney is a 37 y.o. [id number] non pregnant female who presents with pain in her arms and legs. She states the pain is tingling in nature and does not inhibit her ability to use her limbs. She also reports blood in her urine.  She was seen in the ER yesterday and evaluated for the same complaints. There has been no change in her symptoms. She has not picked up the medication prescribed to her yesterday.   OB History    Gravida  7   Para  5   Term      Preterm      AB  2   Living  5     SAB      TAB  1   Ectopic  1   Multiple      Live Births  1           Past Medical History:  Diagnosis Date  . 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 Elevated glucose   . Herpes 2002  . Infection   . Mixed hyperlipidemia   . Nicotine dependence   . Trichimoniasis 12-2010  . Urinary calculi     Past Surgical History:  Procedure Laterality Date  . DILATION AND CURETTAGE OF UTERUS    . LAPAROSCOPY  11/27/2011   Procedure: LAPAROSCOPY OPERATIVE;  Surgeon: Catalina Antigua, MD;  Location: [facility];  Service: Gynecology;  Laterality: Bilateral;  . SALPINGECTOMY     Bilateral for twin ectopic preg.   . TUBAL LIGATION  12-05-2010    Family History  Problem Relation Age of Onset  . Hypertension Mother   . Diabetes Maternal Grandmother   . Heart disease Maternal Grandmother   . Hypertension Maternal Grandmother   . Diabetes Maternal Grandfather   . Heart disease Maternal Grandfather   . Hypertension Maternal Grandfather   . Hyperlipidemia Daughter   . Anesthesia problems Neg Hx     Social History   Tobacco Use  . Smoking status: Never Smoker  . Smokeless tobacco: Never Used  Substance Use Topics  . Alcohol use: Yes    Comment:  social  . Drug use: No    Allergies: No Known Allergies  Medications Prior to Admission  Medication Sig Dispense Refill Last Dose  . doxycycline (VIBRAMYCIN) 100 MG capsule Take 1 capsule (100 mg total) by mouth 2 (two) times daily. One po bid x 7 days 14 capsule 0   . fluconazole (DIFLUCAN) 150 MG tablet Take 1 tablet (150 mg total) by mouth daily. Take second dose 72 hours later if symptoms still persists. (Patient not taking: Reported on 05/04/2018) 2 tablet 0 Completed Course at Unknown time  . HYDROcodone-acetaminophen (NORCO/VICODIN) 5-325 MG tablet Take 1 tablet by mouth every 4 (four) hours as needed. 10 tablet 0 06/10/2018 at Unknown time  . ibuprofen (ADVIL,MOTRIN) 800 MG tablet Take 1 tablet (800 mg total) by mouth 3 (three) times daily. (Patient not taking: Reported on 05/04/2018) 21 tablet 0 Completed Course at Unknown time  . metroNIDAZOLE (FLAGYL) 500 MG tablet Take 1 tablet (500 mg total) by mouth 2 (two) times daily. (Patient not taking: Reported on 05/04/2018) 14 tablet 0 Completed Course at  Unknown time  . pantoprazole (PROTONIX) 20 MG tablet Take 1 tablet (20 mg total) by mouth daily. (Patient not taking: Reported on 05/03/2018) 30 tablet 0 Not Taking at Unknown time  . ranitidine (ZANTAC) 150 MG tablet Take 1 tablet (150 mg total) by mouth 2 (two) times daily. (Patient not taking: Reported on 06/11/2018) 60 tablet 0 Not Taking at Unknown time  . sucralfate (CARAFATE) 1 g tablet Take 1 tablet (1 g total) by mouth 4 (four) times daily -  with meals and at bedtime. (Patient not taking: Reported on 06/11/2018) 30 tablet 1 Not Taking at Unknown time    Review of Systems  Constitutional: Negative.  Negative for fatigue and fever.  HENT: Negative.   Respiratory: Negative.  Negative for shortness of breath.   Cardiovascular: Negative.  Negative for chest pain.  Gastrointestinal: Negative.  Negative for abdominal pain, constipation, diarrhea, nausea and vomiting.  Genitourinary: Positive for  hematuria. Negative for dysuria, vaginal bleeding and vaginal discharge.  Musculoskeletal:       Arm and leg pain  Neurological: Negative.  Negative for dizziness and headaches.   Physical Exam   Blood pressure 128/78, pulse 83, temperature 98.5 F (36.9 C), temperature source Oral, resp. rate 16, height 4\' 11"  (1.499 m), weight 92.1 kg, last menstrual period 05/29/2018, SpO2 100 %.  Physical Exam  Nursing note and vitals reviewed. Constitutional: She is oriented to person, place, and time. She appears well-developed and well-nourished. No distress.  HENT:  Head: Normocephalic.  Eyes: Pupils are equal, round, and reactive to light.  Cardiovascular: Normal rate, regular rhythm and normal heart sounds.  Respiratory: Effort normal and breath sounds normal. No respiratory distress.  GI: Soft. Bowel sounds are normal. She exhibits no distension. There is no tenderness.  Musculoskeletal: Normal range of motion. She exhibits no edema, tenderness or deformity.  Neurological: She is alert and oriented to person, place, and time.  Skin: Skin is warm and dry.  Psychiatric: She has a normal mood and affect. Her behavior is normal. Judgment and thought content normal.    MAU Course  Procedures  MDM Reviewed results and plan of care from ER yesterday. Discussed importance of compliance to prescribed medication regimen and appropriate follow up.   No changes in symptoms or presentation. No concern for any acute processes at this time.  Assessment and Plan   1. Generalized pain    -Discharge home in stable condition -Encouraged patient to pick up and start prescriptions -Pain precautions discussed -Patient advised to follow-up with PCP and gyn of choice for ongoing management of pain. -Patient may return to MAU as needed or if her condition were to change or worsen   Rolm BookbinderCaroline M Piccola [place] CNM 06/12/2018, 11:25 AM

## 2018-06-12 NOTE — Discharge Instructions (Signed)
Primary care follow up  [facility] (will see you even if you do not have sickle cell): [phone] [facility]: [phone] [facility]: [phone] Pain Without a Known Cause Pain can occur in any part of the body and can range from mild to severe. Sometimes no cause can be found for why you are having pain. Some types of pain that can occur without a known cause include:  Headache.  Back pain.  Abdominal pain.  Neck pain.  How is this diagnosed? Your health care provider will try to find the cause of your pain. This may include:  Physical exam.  Medical history.  Blood tests.  Urine tests.  X-rays.  If no cause is found, your health care provider may diagnose you with pain without a known cause. How is this treated? Treatment depends on the kind of pain you have. Your health care provider may prescribe medicines to help relieve your pain. Follow these instructions at home:  Take medicines only as directed by your health care provider.  Stop any activities that cause pain. During periods of severe pain, bed rest may help.  Try to reduce your stress with activities such as yoga or meditation. Talk to your health care provider for other stress-reducing activity recommendations.  Exercise regularly, if approved by your health care provider.  Eat a healthy diet that includes fruits and vegetables. This may improve pain. Talk to your health care provider if you have any questions about your diet. Contact a health care provider if: If you have a painful condition and no reason can be found for the pain or the pain gets worse, it is important to follow up with your health care provider. It may be necessary to repeat tests and look further for a possible cause. This information is not intended to replace advice given to you by your health care provider. Make sure you discuss any questions you have with your health care  provider. Document Released: 06/08/2001 Document Revised: 02/19/2016 Document Reviewed: 01/29/2014 Elsevier Interactive Patient Education  2018 [employer]

## 2018-06-12 NOTE — MAU Note (Signed)
Been having problems with her arms, sharp pains in her arms, legs, hands and back.  Pain in her rt arm.  Was trx for BV, still having blood in her urine was giiven medication for ? UTI, has not picked up that medication. They saw some white spots on her cervix when they were doing her exam.

## 2018-06-13 ENCOUNTER — Emergency Department (HOSPITAL_COMMUNITY)
Admission: EM | Admit: 2018-06-13 | Discharge: 2018-06-13 | Disposition: A | Discharge status: Home / Self Care | Payer: Self-pay | Attending: Emergency Medicine | Admitting: Emergency Medicine

## 2018-06-13 ENCOUNTER — Encounter (HOSPITAL_COMMUNITY): Payer: Self-pay

## 2018-06-13 ENCOUNTER — Other Ambulatory Visit: Payer: Self-pay

## 2018-06-13 DIAGNOSIS — X503XXA Overexertion from repetitive movements, initial encounter: Secondary | ICD-10-CM | POA: Insufficient documentation

## 2018-06-13 DIAGNOSIS — M79604 Pain in right leg: Secondary | ICD-10-CM | POA: Insufficient documentation

## 2018-06-13 DIAGNOSIS — Y9389 Activity, other specified: Secondary | ICD-10-CM | POA: Insufficient documentation

## 2018-06-13 DIAGNOSIS — Y9289 Other specified places as the place of occurrence of the external cause: Secondary | ICD-10-CM | POA: Insufficient documentation

## 2018-06-13 DIAGNOSIS — M79602 Pain in left arm: Secondary | ICD-10-CM | POA: Insufficient documentation

## 2018-06-13 DIAGNOSIS — M79605 Pain in left leg: Secondary | ICD-10-CM | POA: Insufficient documentation

## 2018-06-13 DIAGNOSIS — M79645 Pain in left finger(s): Secondary | ICD-10-CM | POA: Insufficient documentation

## 2018-06-13 DIAGNOSIS — M79644 Pain in right finger(s): Secondary | ICD-10-CM | POA: Insufficient documentation

## 2018-06-13 DIAGNOSIS — Y990 Civilian activity done for income or pay: Secondary | ICD-10-CM | POA: Insufficient documentation

## 2018-06-13 DIAGNOSIS — M79601 Pain in right arm: Secondary | ICD-10-CM | POA: Insufficient documentation

## 2018-06-13 MED ORDER — NAPROXEN 500 MG PO TABS: 500.0000 mg | ORAL_TABLET | Freq: Two times a day (BID) | ORAL | 0 refills | Status: DC

## 2018-06-13 NOTE — ED Notes (Signed)
Pt was requesting something to eat before being discharged and was provided small snack.

## 2018-06-13 NOTE — ED Triage Notes (Signed)
Per [facility] pt from home for bilat arm pains for week that feels cramping

## 2018-06-13 NOTE — ED Triage Notes (Signed)
She c/o bilat. Arm aches; and sometimes aching in lower extremities. 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 She is in no distress.

## 2018-06-13 NOTE — ED Provider Notes (Signed)
[facility] Emergency Department Provider Note MRN:  [id number]  Arrival date & time: 06/13/18     Chief Complaint   Extremity pain History of Present Illness   Theresa Mooney is a 37 y.o. year-old female with a history of trichomonas presenting to the ED with chief complaint of extremity pain.  2 to 3 weeks of persistent pain of all 4 extremities.  Began in the left leg, more recently has spread to the bilateral legs and arms, with the right arm hurting more than the left arm.  The pain is more located in the muscles, not the joints.  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  Endorsing sharp shooting pains to the bilateral fingers.  Denies neck pain, no recent trauma to the neck or back.  Denies recent fevers, no chest pain or shortness of breath, no abdominal pain.  The pain is worse with physical activity, improved with rest.  Pain is moderate in severity.  Review of Systems  A complete 10 system review of systems was obtained and all systems are negative except as noted in the HPI and PMH.   Patient's Health History    Past Medical History:  Diagnosis Date  . 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 Elevated glucose   . Herpes 2002  . Infection   . Mixed hyperlipidemia   . Nicotine dependence   . Trichimoniasis 12-2010  . Urinary calculi     Past Surgical History:  Procedure Laterality Date  . DILATION AND CURETTAGE OF UTERUS    . LAPAROSCOPY  11/27/2011   Procedure: LAPAROSCOPY OPERATIVE;  Surgeon: Catalina Antigua, MD;  Location: [facility];  Service: Gynecology;  Laterality: Bilateral;  . SALPINGECTOMY     Bilateral for twin ectopic preg.   . TUBAL LIGATION  12-05-2010    Family History  Problem Relation Age of Onset  . Hypertension Mother   . Diabetes Maternal Grandmother   . Heart disease Maternal Grandmother   . Hypertension Maternal Grandmother   . Diabetes Maternal  Grandfather   . Heart disease Maternal Grandfather   . Hypertension Maternal Grandfather   . Hyperlipidemia Daughter   . Anesthesia problems Neg Hx     Social History   Socioeconomic History  . Marital status: Single    Spouse name: Not on file  . Number of children: Not on file  . Years of education: Not on file  . Highest education level: Not on file  Occupational History  . Not on file  Social Needs  . Financial resource strain: Not on file  . Food insecurity:    Worry: Not on file    Inability: Not on file  . Transportation needs:    Medical: Not on file    Non-medical: Not on file  Tobacco Use  . Smoking status: Never Smoker  . Smokeless tobacco: Never Used  Substance and Sexual Activity  . Alcohol use: Yes    Comment: social  . Drug use: No  . Sexual activity: Yes    Birth control/protection: Surgical  Lifestyle  . Physical activity:    Days per week: Not on file    Minutes per session: Not on file  . Stress: Not on file  Relationships  . Social connections:    Talks on phone: Not on file    Gets together: Not on file    Attends religious service: Not on  file    Active member of club or organization: Not on file    Attends meetings of clubs or organizations: Not on file    Relationship status: Not on file  . Intimate partner violence:    Fear of current or ex partner: Not on file    Emotionally abused: Not on file    Physically abused: Not on file    Forced sexual activity: Not on file  Other Topics Concern  . Not on file  Social History Narrative  . Not on file     Physical Exam  Vital Signs and Nursing Notes reviewed Vitals:   06/13/18 1815 06/13/18 1821  BP: 115/72 119/78  Pulse: 77 64  Resp: 17 18  Temp:    SpO2: 99% 98%    CONSTITUTIONAL: Well-appearing, NAD NEURO:  Alert and oriented x 3, no focal deficits EYES:  eyes equal and reactive ENT/NECK:  no LAD, no JVD CARDIO: Regular rate, well-perfused, normal S1 and S2 PULM:  CTAB no  wheezing or rhonchi GI/GU:  normal bowel sounds, non-distended, non-tender MSK/SPINE:  No gross deformities, no edema, normal range of motion of bilateral shoulders, elbows, hips, knees, ankles SKIN:  no rash, atraumatic PSYCH:  Appropriate speech and behavior  Diagnostic and Interventional Summary    EKG Interpretation  Date/Time:    Ventricular Rate:    PR Interval:    QRS Duration:   QT Interval:    QTC Calculation:   R Axis:     Text Interpretation:        Labs Reviewed - No data to display  No orders to display    Medications - No data to display   Procedures Critical Care  ED Course and Medical Decision Making  I have reviewed the triage vital signs and the nursing notes.  Pertinent labs & imaging results that were available during my care of the patient were reviewed by me and considered in my medical decision making (see below for details).    Favoring muscle strain/soreness from overuse in this otherwise healthy 37 year old female.  The pain is not really located in the joints, normal range of motion, nothing to suggest septic joint.  History of trichomonas but no recent diarrheal illness, no recent STD to suggest Reiter's arthritis.  Was started on doxycycline 2 days ago for presumed tickborne causes of pain.  Will advise rest, light duty, NSAIDs and continuing this antibiotic regimen.  After the discussed management above, the patient was determined to be safe for discharge.  The patient was in agreement with this plan and all questions regarding their care were answered.  ED return precautions were discussed and the patient will return to the ED with any significant worsening of condition.   Elmer SowMichael M. Pilar PlateBero, MD [facility] [email or web address]  Final Clinical Impressions(s) / ED Diagnoses     ICD-10-CM   1. Pain in both lower extremities M79.604    M79.605   2. Pain in both upper extremities M79.601    M79.602      ED Discharge Orders         Ordered    naproxen (NAPROSYN) 500 MG tablet  2 times daily     06/13/18 1841             Sabas SousBero, Demarques Pilz M, MD 06/13/18 2307

## 2018-06-13 NOTE — Discharge Instructions (Addendum)
You were evaluated in the Emergency Department and after careful evaluation, we did not find any emergent condition requiring admission or further testing in the hospital.  Your symptoms today seem to be due to soreness from overuse.  It might also be related to a tick bite.  These continue to take your doxycycline antibiotic at home.  You should also take the anti-inflammatory provided today.  You should rest for the next 2 days and then return to work on light duty.  Please return to the Emergency Department if you experience any worsening of your condition.  We encourage you to follow up with a primary care provider.  Thank you for allowing us to be a part of your care.

## 2018-06-15 ENCOUNTER — Other Ambulatory Visit: Payer: Self-pay

## 2018-06-15 ENCOUNTER — Emergency Department (HOSPITAL_COMMUNITY)
Admission: EM | Admit: 2018-06-15 | Discharge: 2018-06-16 | Disposition: A | Discharge status: Home / Self Care | Payer: Self-pay | Attending: Emergency Medicine | Admitting: Emergency Medicine

## 2018-06-15 ENCOUNTER — Encounter (HOSPITAL_COMMUNITY): Payer: Self-pay | Admitting: Emergency Medicine

## 2018-06-15 DIAGNOSIS — M62838 Other muscle spasm: Secondary | ICD-10-CM | POA: Insufficient documentation

## 2018-06-15 DIAGNOSIS — Z7982 Long term (current) use of aspirin: Secondary | ICD-10-CM | POA: Insufficient documentation

## 2018-06-15 DIAGNOSIS — M542 Cervicalgia: Secondary | ICD-10-CM | POA: Insufficient documentation

## 2018-06-15 DIAGNOSIS — M7918 Myalgia, other site: Secondary | ICD-10-CM | POA: Insufficient documentation

## 2018-06-15 DIAGNOSIS — J029 Acute pharyngitis, unspecified: Secondary | ICD-10-CM | POA: Insufficient documentation

## 2018-06-15 DIAGNOSIS — H9203 Otalgia, bilateral: Secondary | ICD-10-CM | POA: Insufficient documentation

## 2018-06-15 DIAGNOSIS — R52 Pain, unspecified: Secondary | ICD-10-CM

## 2018-06-15 DIAGNOSIS — Z79899 Other long term (current) drug therapy: Secondary | ICD-10-CM | POA: Insufficient documentation

## 2018-06-15 LAB — BASIC METABOLIC PANEL
ANION GAP: 7 (ref 5–15)
BUN: 11 mg/dL (ref 6–20)
CALCIUM: 9.2 mg/dL (ref 8.9–10.3)
CO2: 28 mmol/L (ref 22–32)
CREATININE: 0.91 mg/dL (ref 0.44–1.00)
Chloride: 107 mmol/L (ref 98–111)
GFR calc non Af Amer: >60 mL/min (ref >60)
GLUCOSE: 94 mg/dL (ref 70–99)
Potassium: 3.9 mmol/L (ref 3.5–5.1)
Sodium: 142 mmol/L (ref 135–145)

## 2018-06-15 LAB — CBC WITH DIFFERENTIAL/PLATELET
BASOS ABS: 0 10*3/uL (ref 0.0–0.1)
BASOS PCT: 1 %
EOS PCT: 1 %
Eosinophils Absolute: 0 10*3/uL (ref 0.0–0.7)
HCT: 38.7 % (ref 36.0–46.0)
Hemoglobin: 12.4 g/dL (ref 12.0–15.0)
Lymphocytes Relative: 49 %
Lymphs Abs: 3.4 10*3/uL (ref 0.7–4.0)
MCH: 28 pg (ref 26.0–34.0)
MCHC: 32 g/dL (ref 30.0–36.0)
MCV: 87.4 fL (ref 78.0–100.0)
MONO ABS: 0.5 10*3/uL (ref 0.1–1.0)
Monocytes Relative: 7 %
NEUTROS PCT: 42 %
Neutro Abs: 2.8 10*3/uL (ref 1.7–7.7)
Platelets: 365 10*3/uL (ref 150–400)
RBC: 4.43 MIL/uL (ref 3.87–5.11)
RDW: 14.1 % (ref 11.5–15.5)
WBC: 6.8 10*3/uL (ref 4.0–10.5)

## 2018-06-15 LAB — URINALYSIS, ROUTINE W REFLEX MICROSCOPIC
BACTERIA UA: NONE SEEN
BILIRUBIN URINE: NEGATIVE
Glucose, UA: NEGATIVE mg/dL
Ketones, ur: NEGATIVE mg/dL
NITRITE: NEGATIVE
PH: 5 (ref 5.0–8.0)
Protein, ur: NEGATIVE mg/dL
RBC / HPF: >50 RBC/hpf — ABNORMAL HIGH (ref 0–5)
SPECIFIC GRAVITY, URINE: 1.025 (ref 1.005–1.030)

## 2018-06-15 LAB — I-STAT CG4 LACTIC ACID, ED: LACTIC ACID, VENOUS: 0.81 mmol/L (ref 0.5–1.9)

## 2018-06-15 LAB — CK: CK TOTAL: 71 U/L (ref 38–234)

## 2018-06-15 MED ORDER — METHOCARBAMOL 500 MG PO TABS: 500.0000 mg | ORAL_TABLET | Freq: Two times a day (BID) | ORAL | 0 refills | Status: DC

## 2018-06-15 MED ORDER — LIDOCAINE VISCOUS HCL 2 % MT SOLN
15.0000 mL | Freq: Once | OROMUCOSAL | Status: AC
  Administered 2018-06-15: 15 mL via OROMUCOSAL
  Filled 2018-06-15: qty 15

## 2018-06-15 MED ORDER — METHOCARBAMOL 500 MG PO TABS
1000.0000 mg | ORAL_TABLET | Freq: Once | ORAL | Status: AC
  Administered 2018-06-15: 1000 mg via ORAL
  Filled 2018-06-15: qty 2

## 2018-06-15 MED ORDER — KETOROLAC TROMETHAMINE 30 MG/ML IJ SOLN
30.0000 mg | Freq: Once | INTRAMUSCULAR | Status: AC
  Administered 2018-06-15: 30 mg via INTRAVENOUS
  Filled 2018-06-15: qty 1

## 2018-06-15 MED ORDER — SODIUM CHLORIDE 0.9 % IV BOLUS: 1000.0000 mL | Freq: Once | INTRAVENOUS | Status: DC

## 2018-06-15 NOTE — ED Triage Notes (Signed)
Pt arriving with bilateral arm/leg pain.Pt seen for same x4 recently. Was prescribed antibiotic last weekend states it has not helped. Pt also reporting pain on the left side of her face/neck.

## 2018-06-15 NOTE — ED Provider Notes (Signed)
[facility]-EMERGENCY DEPT Provider Note   CSN: [id number] Arrival date & time: 06/15/18  2005     History   Chief Complaint Chief Complaint  Patient presents with  . Generalized Body Aches    HPI Theresa Mooney is a 37 y.o. female.  Theresa Mooney is a 37 y.o. Female with a history of hyperlipidemia, marijuana use and kidney stones, who presents to the emergency department for evaluation of myalgias.  She has been seen multiple times in the ED for the same.  She reports pain in both arms and legs intermittently for the past several days.  She is also reporting some pain in her throat, any ear that she noticed today.  No drainage from the ear or change in hearing.  She denies nasal congestion or rhinorrhea.  She denies any fevers or chills, no rash.  No chest pain or shortness of breath, no abdominal pain, nausea or vomiting.  Patient reports she drinks maybe 3 16 ounce bottles of water a day and has not been eating much today.  She has been taking Naprosyn for pain, but has not taken any today, during 1 of her ED visit she was started on doxycycline for presumed tickborne illness as cause for myalgias, and is continuing to take this medication as directed.  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      Past Medical History:  Diagnosis Date  . 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 Elevated glucose   . Herpes 2002  . Infection   . Mixed hyperlipidemia   . Nicotine dependence   . Trichimoniasis 12-2010  . Urinary calculi     Patient Active Problem List   Diagnosis Date Noted  . h/o postpartum BTL  12/13/2011  . S/P ectopic pregnancy 12/13/2011    Past Surgical History:  Procedure Laterality Date  . DILATION AND CURETTAGE OF UTERUS    . LAPAROSCOPY  11/27/2011   Procedure: LAPAROSCOPY OPERATIVE;  Surgeon: Catalina Antigua, MD;  Location: [facility];   Service: Gynecology;  Laterality: Bilateral;  . SALPINGECTOMY     Bilateral for twin ectopic preg.   . TUBAL LIGATION  12-05-2010     OB History    Gravida  7   Para  5   Term      Preterm      AB  2   Living  5     SAB      TAB  1   Ectopic  1   Multiple      Live Births  1            Home Medications    Prior to Admission medications   Medication Sig Start Date End Date Taking? Authorizing Provider  ASPIRIN PO Take 1 tablet by mouth daily.    [provider]  doxycycline (VIBRAMYCIN) 100 MG capsule Take 1 capsule (100 mg total) by mouth 2 (two) times daily. One po bid x 7 days 06/11/18   Melene Plan, DO  fluconazole (DIFLUCAN) 150 MG tablet Take 1 tablet (150 mg total) by mouth daily. Take second dose 72 hours later if symptoms still persists. Patient not taking: Reported on 06/13/2018 04/19/18   Belinda Fisher, PA-C  HYDROcodone-acetaminophen (NORCO/VICODIN) 5-325 MG tablet Take 1 tablet by mouth every 4 (four) hours as needed. Patient not taking: Reported on  06/13/2018 06/09/18   Jacalyn Lefevre, MD  ibuprofen (ADVIL,MOTRIN) 800 MG tablet Take 1 tablet (800 mg total) by mouth 3 (three) times daily. Patient not taking: Reported on 06/13/2018 11/27/17   Long, Arlyss Repress, MD  metroNIDAZOLE (FLAGYL) 500 MG tablet Take 1 tablet (500 mg total) by mouth 2 (two) times daily. Patient not taking: Reported on 06/13/2018 04/19/18   Belinda Fisher, PA-C  naproxen (NAPROSYN) 500 MG tablet Take 1 tablet (500 mg total) by mouth 2 (two) times daily. 06/13/18   Sabas Sous, MD  pantoprazole (PROTONIX) 20 MG tablet Take 1 tablet (20 mg total) by mouth daily. 11/27/17 06/13/18  Long, Arlyss Repress, MD  ranitidine (ZANTAC) 150 MG tablet Take 1 tablet (150 mg total) by mouth 2 (two) times daily. 05/04/18   Elpidio Anis, PA-C  sucralfate (CARAFATE) 1 g tablet Take 1 tablet (1 g total) by mouth 4 (four) times daily -  with meals and at bedtime. 05/04/18   Elpidio Anis, PA-C    Family History Family  History  Problem Relation Age of Onset  . Hypertension Mother   . Diabetes Maternal Grandmother   . Heart disease Maternal Grandmother   . Hypertension Maternal Grandmother   . Diabetes Maternal Grandfather   . Heart disease Maternal Grandfather   . Hypertension Maternal Grandfather   . Hyperlipidemia Daughter   . Anesthesia problems Neg Hx     Social History Social History   Tobacco Use  . Smoking status: Never Smoker  . Smokeless tobacco: Never Used  Substance Use Topics  . Alcohol use: Yes    Comment: social  . Drug use: No     Allergies   Hydrocodone   Review of Systems Review of Systems  Constitutional: Negative for chills and fever.  HENT: Positive for ear pain and sore throat. Negative for ear discharge, hearing loss and rhinorrhea.   Eyes: Negative for visual disturbance.  Respiratory: Negative for cough and shortness of breath.   Cardiovascular: Negative for chest pain.  Gastrointestinal: Negative for abdominal pain, nausea and vomiting.  Genitourinary: Negative for dysuria and frequency.  Musculoskeletal: Positive for myalgias and neck pain. Negative for arthralgias, joint swelling and neck stiffness.  Skin: Negative for color change and rash.  Neurological: Negative for dizziness, syncope and light-headedness.     Physical Exam Updated Vital Signs BP 117/83 (BP Location: Left Arm)   Pulse 71   Temp 98.4 F (36.9 C) (Oral)   Resp 18   Ht 4\' 11"  (1.499 m)   Wt 92.1 kg   LMP 05/29/2018   SpO2 99%   BMI 41.00 kg/m   Physical Exam  Constitutional: She is oriented to person, place, and time. She appears well-developed and well-nourished. No distress.  HENT:  Head: Normocephalic and atraumatic.  TMs clear with good landmarks, moderate nasal mucosa edema with clear rhinorrhea, posterior oropharynx clear and moist, with no erythema, no edema or exudates  Eyes: Right eye exhibits no discharge. Left eye exhibits no discharge.  Neck: Normal range of  motion. Neck supple.  No stridor Tenderness to palpation over the distribution of the left trapezius, no midline tenderness, full range of motion, no lymphadenopathy  Cardiovascular: Normal rate, regular rhythm, normal heart sounds and intact distal pulses. Exam reveals no gallop and no friction rub.  No murmur heard. Pulmonary/Chest: Effort normal and breath sounds normal. No stridor. No respiratory distress. She has no wheezes. She has no rales.  Respirations equal and unlabored, patient able to speak in  full sentences, lungs clear to auscultation bilaterally  Abdominal: Soft. Bowel sounds are normal. She exhibits no distension and no mass. There is no tenderness. There is no guarding.  Abdomen soft, nondistended, nontender to palpation in all quadrants without guarding or peritoneal signs  Musculoskeletal: She exhibits no edema or deformity.  All joints are supple and easily movable, no appreciable swelling or deformity, no erythema or warmth, mild tenderness over muscles without deformity or swelling Moving all extremities without difficulty, all compartments soft  Neurological: She is alert and oriented to person, place, and time. Coordination normal.  Speech is clear, able to follow commands CN III-XII intact Normal strength in upper and lower extremities bilaterally including dorsiflexion and plantar flexion, strong and equal grip strength Sensation normal to light and sharp touch Moves extremities without ataxia, coordination intact  Skin: Skin is warm and dry. Capillary refill takes less than 2 seconds. She is not diaphoretic.  Psychiatric: She has a normal mood and affect. Her behavior is normal.  Nursing note and vitals reviewed.    ED Treatments / Results  Labs (all labs ordered are listed, but only abnormal results are displayed) Labs Reviewed  URINALYSIS, ROUTINE W REFLEX MICROSCOPIC - Abnormal; Notable for the following components:      Result Value   APPearance HAZY (*)     Hgb urine dipstick MODERATE (*)    Leukocytes, UA TRACE (*)    RBC / HPF >50 (*)    All other components within normal limits  CBC WITH DIFFERENTIAL/PLATELET  BASIC METABOLIC PANEL  CK  I-STAT CG4 LACTIC ACID, ED  I-STAT CG4 LACTIC ACID, ED    EKG None  Radiology No results found.  Procedures Procedures (including critical care time)  Medications Ordered in ED Medications  ketorolac (TORADOL) 30 MG/ML injection 30 mg (30 mg Intravenous Given 06/15/18 2305)  methocarbamol (ROBAXIN) tablet 1,000 mg (1,000 mg Oral Given 06/15/18 2245)  lidocaine (XYLOCAINE) 2 % viscous mouth solution 15 mL (15 mLs Mouth/Throat Given 06/15/18 2246)     Initial Impression / Assessment and Plan / ED Course  I have reviewed the triage vital signs and the nursing notes.  Pertinent labs & imaging results that were available during my care of the patient were reviewed by me and considered in my medical decision making (see chart for details).  Presents for muscle aches in all 4 extremities, she is been seen 4 times for the same recently, has been placed on doxycycline for presumed tickborne illness and has been taking this.  She reports she has been taking Naprosyn for pain.  Reports she now feels like she has a muscle spasm in her left neck and shoulder.  Has also been experiencing some sore throat and pain in her left ear.  No fevers or chills.  On arrival patient is well-appearing in no acute distress with normal vitals.  On exam lungs are clear, abdomen is benign, all compartments are soft, and patient is moving all joints without difficulty, no focal redness or swelling over any one joint suggest septic arthritis.  She did have some mild swelling her nasal mucosa, ears show no evidence of otitis, and posterior oropharynx is clear moist without erythema or swelling.  I think she probably has some element of allergic rhinitis causing pressure buildup in her ears, recommend Flonase and Zyrtec.  Check basic  labs as well as CK given persistent muscle aches.  Will give IV fluids, Toradol and Robaxin.  Labs are overall extremely reassuring, no  leukocytosis, normal hemoglobin, no acute electrolyte derangements, lactic acid is negative and CK is normal.  Urinalysis does show some blood in the urine, but this appears to be chronic, patient is known to have bilateral nonobstructing kidney stones.  She reports some intermittent flank pains but this is not been worsening since this is diagnosed back in March, no focal flank pain or urinary symptoms to suggest obstructing stone, she has upcoming follow-up with PCP as well as urology.  Nursing unable to obtain IV and since patient is tolerating p.o. I think it is reasonable to push IV fluids rather than continue to stick the patient.  Patient is in agreement with this plan.  Will treat with IM Toradol, Robaxin also given dose of Tylenol.  Encouraged to drink more water daily.  She has upcoming follow-up next week with PCP.  Return precautions have been discussed.  Patient expresses understanding and is in agreement with plan.  Stable for discharge home at this time.   Final Clinical Impressions(s) / ED Diagnoses   Final diagnoses:  Body aches  Muscle spasm  Otalgia of both ears    ED Discharge Orders    None       Legrand RamsFord, Neshia Mckenzie N, PA-C 06/16/18 0009    Loren RacerYelverton, David, MD 06/16/18 1719

## 2018-06-15 NOTE — Discharge Instructions (Signed)
Reassuring today.  Please make sure you increase your fluid intake and are drinking at least 5-6 bottles of water a day.  In addition to continuing Naprosyn you can also use Robaxin, heat and massage for muscle cramps.  Follow-up with your primary care doctor as planned.  If muscle cramping worsens you develop fevers, vomiting, rash, chest pain or abdominal pain or any other new or concerning symptoms return to ED  Ear pain is likely due to pressure buildup from nasal congestion, you can use Zyrtec once daily and Flonase in both nostrils once daily to help with this.

## 2018-06-16 MED ORDER — ACETAMINOPHEN 500 MG PO TABS
1000.0000 mg | ORAL_TABLET | Freq: Once | ORAL | Status: AC
  Administered 2018-06-16: 1000 mg via ORAL
  Filled 2018-06-16: qty 2

## 2018-06-24 ENCOUNTER — Emergency Department (HOSPITAL_BASED_OUTPATIENT_CLINIC_OR_DEPARTMENT_OTHER): Payer: Self-pay

## 2018-06-24 ENCOUNTER — Emergency Department (HOSPITAL_COMMUNITY)
Admission: EM | Admit: 2018-06-24 | Discharge: 2018-06-24 | Disposition: A | Discharge status: Home / Self Care | Payer: Self-pay | Attending: Emergency Medicine | Admitting: Emergency Medicine

## 2018-06-24 ENCOUNTER — Other Ambulatory Visit: Payer: Self-pay

## 2018-06-24 DIAGNOSIS — M79609 Pain in unspecified limb: Secondary | ICD-10-CM

## 2018-06-24 DIAGNOSIS — M7918 Myalgia, other site: Secondary | ICD-10-CM | POA: Insufficient documentation

## 2018-06-24 DIAGNOSIS — M791 Myalgia, unspecified site: Secondary | ICD-10-CM

## 2018-06-24 DIAGNOSIS — F1221 Cannabis dependence, in remission: Secondary | ICD-10-CM | POA: Insufficient documentation

## 2018-06-24 DIAGNOSIS — M79605 Pain in left leg: Secondary | ICD-10-CM | POA: Insufficient documentation

## 2018-06-24 DIAGNOSIS — Z79899 Other long term (current) drug therapy: Secondary | ICD-10-CM | POA: Insufficient documentation

## 2018-06-24 MED ORDER — ACETAMINOPHEN 325 MG PO TABS: 650.0000 mg | ORAL_TABLET | Freq: Once | ORAL | Status: DC

## 2018-06-24 MED ORDER — PREDNISONE 10 MG PO TABS: 40.0000 mg | ORAL_TABLET | Freq: Every day | ORAL | 0 refills | Status: DC

## 2018-06-24 NOTE — ED Provider Notes (Signed)
[facility]-EMERGENCY DEPT Provider Note   CSN: [id number] Arrival date & time: 06/24/18  1407     History   Chief Complaint Chief Complaint  Patient presents with  . Leg Pain  . Numbness  . Tingling    HPI Theresa Mooney is a 37 y.o. female  with a history of hyperlipidemia, marijuana use and kidney stones, who presents to the emergency department for evaluation of myalgias.  She has been seen multiple times in the ED for the same.  She reports pain in both legs intermittently for the past several days. Today patient reports left leg numbness and tingling with calf pain. Patient was evaluated here 9/19 and treated with muscle relaxer and also with doxycycline due to a tick bite. Patient reports she can not take the doxycycline because it made her sick. She also states that the hydrocodone was to strong. Patient states that the pain and tingling seems to move from arms to legs and back and forth.  HPI  Past Medical History:  Diagnosis Date  . 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 Elevated glucose   . Herpes 2002  . Infection   . Mixed hyperlipidemia   . Nicotine dependence   . Trichimoniasis 12-2010  . Urinary calculi     Patient Active Problem List   Diagnosis Date Noted  . h/o postpartum BTL  12/13/2011  . S/P ectopic pregnancy 12/13/2011    Past Surgical History:  Procedure Laterality Date  . DILATION AND CURETTAGE OF UTERUS    . LAPAROSCOPY  11/27/2011   Procedure: LAPAROSCOPY OPERATIVE;  Surgeon: Catalina Antigua, MD;  Location: [facility];  Service: Gynecology;  Laterality: Bilateral;  . SALPINGECTOMY     Bilateral for twin ectopic preg.   . TUBAL LIGATION  12-05-2010     OB History    Gravida  7   Para  5   Term      Preterm      AB  2   Living  5     SAB      TAB  1   Ectopic  1   Multiple      Live Births  1            Home Medications    Prior to Admission medications   Medication Sig Start Date End Date  Taking? Authorizing Provider  ASPIRIN PO Take 1 tablet by mouth daily.    [provider]  doxycycline (VIBRAMYCIN) 100 MG capsule Take 1 capsule (100 mg total) by mouth 2 (two) times daily. One po bid x 7 days 06/11/18   Melene Plan, DO  HYDROcodone-acetaminophen (NORCO/VICODIN) 5-325 MG tablet Take 1 tablet by mouth every 4 (four) hours as needed. Patient not taking: Reported on 06/13/2018 06/09/18   Jacalyn Lefevre, MD  methocarbamol (ROBAXIN) 500 MG tablet Take 1 tablet (500 mg total) by mouth 2 (two) times daily. 06/15/18   Dartha Lodge, PA-C  naproxen (NAPROSYN) 500 MG tablet Take 1 tablet (500 mg total) by mouth 2 (two) times daily. 06/13/18   Sabas Sous, MD  pantoprazole (PROTONIX) 20 MG tablet Take 1 tablet (20 mg total) by mouth daily. 11/27/17 06/13/18  Long, Arlyss Repress, MD  predniSONE (DELTASONE) 10 MG tablet Take 4 tablets (40 mg total) by mouth daily. 06/24/18   Janne Napoleon, NP  ranitidine (ZANTAC) 150 MG tablet Take 1 tablet (150 mg total) by mouth 2 (two) times daily. 05/04/18   Upstill,  Shari, PA-C  sucralfate (CARAFATE) 1 g tablet Take 1 tablet (1 g total) by mouth 4 (four) times daily -  with meals and at bedtime. 05/04/18   Elpidio Anis, PA-C    Family History Family History  Problem Relation Age of Onset  . Hypertension Mother   . Diabetes Maternal Grandmother   . Heart disease Maternal Grandmother   . Hypertension Maternal Grandmother   . Diabetes Maternal Grandfather   . Heart disease Maternal Grandfather   . Hypertension Maternal Grandfather   . Hyperlipidemia Daughter   . Anesthesia problems Neg Hx     Social History Social History   Tobacco Use  . Smoking status: Never Smoker  . Smokeless tobacco: Never Used  Substance Use Topics  . Alcohol use: Yes    Comment: social  . Drug use: No     Allergies   Hydrocodone   Review of Systems Review of Systems  Musculoskeletal: Positive for arthralgias and myalgias.  Neurological: Positive for  numbness. Negative for weakness.  All other systems reviewed and are negative.    Physical Exam Updated Vital Signs BP 116/76 (BP Location: Left Arm)   Pulse 74   Temp 99.2 F (37.3 C) (Oral)   Resp 16   Ht 4\' 11"  (1.499 m)   Wt 92.1 kg   LMP 05/29/2018   SpO2 99%   BMI 41.00 kg/m   Physical Exam  Constitutional: She appears well-developed and well-nourished. No distress.  HENT:  Head: Normocephalic and atraumatic.  Eyes: Conjunctivae and EOM are normal.  Neck: Neck supple.  Cardiovascular: Normal rate.  Pulmonary/Chest: Effort normal.  Abdominal: Soft. There is no tenderness.  Musculoskeletal:       Left lower leg: She exhibits tenderness. She exhibits no deformity and no laceration. Swelling: minimal.  Pedal pulses 2+, adequate circulation. Tender with palpation left calf and left posterior knee.  Neurological: She is alert. She has normal strength. Gait normal.  Reflex Scores:      Bicep reflexes are 2+ on the right side and 2+ on the left side.      Brachioradialis reflexes are 2+ on the right side and 2+ on the left side.      Patellar reflexes are 2+ on the right side and 2+ on the left side. Skin: Skin is warm and dry.  Nursing note and vitals reviewed.    ED Treatments / Results  Labs (all labs ordered are listed, but only abnormal results are displayed) Labs Reviewed - No data to display  Radiology No results found.  Procedures: DVT study of left lowe extremity negative. Procedures (including critical care time)  Medications Ordered in ED Medications - No data to display   Initial Impression / Assessment and Plan / ED Course  I have reviewed the triage vital signs and the nursing notes. 37 y.o. female with left leg pain and tingling stable for d/c with negative DVT study. Will treat for pain and inflammation and patient to f/u with her PCP. Patient agrees with plan.   Final Clinical Impressions(s) / ED Diagnoses   Final diagnoses:  Myalgia    Left leg pain    ED Discharge Orders         Ordered    predniSONE (DELTASONE) 10 MG tablet  Daily     06/24/18 1651           Damian Leavell Magdalena, NP 06/24/18 1659    Virgina Norfolk, DO 06/24/18 2046

## 2018-06-24 NOTE — Discharge Instructions (Addendum)
Your study to look for a blood clot today was negative. I am starting you on medication for pain and inflammation. Follow up with your doctor.

## 2018-06-24 NOTE — ED Triage Notes (Signed)
Patient arrives with c/o left leg numbness and tingling. Patient states she has similar symptoms in right but worse in left. Patient reports tenderness to calf. No swelling noted. Patient c/o lower back pain that radiates to side. Patient prescribed doxycycline 1 week ago for prophylaxis for a tick bite.

## 2018-06-24 NOTE — Progress Notes (Signed)
VASCULAR LAB PRELIMINARY  PRELIMINARY  PRELIMINARY  PRELIMINARY  Left lower extremity venous duplex completed.    Preliminary report:  There is no DVT or SVT noted in the left lower extremity.   Sameul Tagle, RVT 06/24/2018, 4:40 PM

## 2018-07-05 ENCOUNTER — Ambulatory Visit: Payer: Self-pay | Admitting: Obstetrics and Gynecology

## 2018-07-10 ENCOUNTER — Other Ambulatory Visit: Payer: Self-pay

## 2018-07-18 LAB — CYTOLOGY - PAP
Diagnosis: UNDETERMINED
HPV: NOT DETECTED

## 2018-07-21 ENCOUNTER — Ambulatory Visit (HOSPITAL_COMMUNITY)
Admission: EM | Admit: 2018-07-21 | Discharge: 2018-07-21 | Disposition: A | Discharge status: Home / Self Care | Payer: Self-pay | Attending: Family Medicine | Admitting: Family Medicine

## 2018-07-21 ENCOUNTER — Encounter (HOSPITAL_COMMUNITY): Payer: Self-pay | Admitting: Emergency Medicine

## 2018-07-21 DIAGNOSIS — B9689 Other specified bacterial agents as the cause of diseases classified elsewhere: Secondary | ICD-10-CM | POA: Insufficient documentation

## 2018-07-21 DIAGNOSIS — R319 Hematuria, unspecified: Secondary | ICD-10-CM

## 2018-07-21 DIAGNOSIS — N898 Other specified noninflammatory disorders of vagina: Secondary | ICD-10-CM

## 2018-07-21 DIAGNOSIS — Z885 Allergy status to narcotic agent status: Secondary | ICD-10-CM | POA: Insufficient documentation

## 2018-07-21 DIAGNOSIS — Z79899 Other long term (current) drug therapy: Secondary | ICD-10-CM | POA: Insufficient documentation

## 2018-07-21 DIAGNOSIS — Z7952 Long term (current) use of systemic steroids: Secondary | ICD-10-CM | POA: Insufficient documentation

## 2018-07-21 DIAGNOSIS — Z7982 Long term (current) use of aspirin: Secondary | ICD-10-CM | POA: Insufficient documentation

## 2018-07-21 DIAGNOSIS — N761 Subacute and chronic vaginitis: Secondary | ICD-10-CM | POA: Insufficient documentation

## 2018-07-21 DIAGNOSIS — Z3202 Encounter for pregnancy test, result negative: Secondary | ICD-10-CM

## 2018-07-21 LAB — POCT URINALYSIS DIP (DEVICE)
BILIRUBIN URINE: NEGATIVE
Glucose, UA: NEGATIVE mg/dL
KETONES UR: NEGATIVE mg/dL
LEUKOCYTES UA: NEGATIVE
Nitrite: NEGATIVE
PH: 6 (ref 5.0–8.0)
Protein, ur: NEGATIVE mg/dL
Specific Gravity, Urine: 1.02 (ref 1.005–1.030)
Urobilinogen, UA: 0.2 mg/dL (ref 0.0–1.0)

## 2018-07-21 LAB — POCT PREGNANCY, URINE: Preg Test, Ur: NEGATIVE

## 2018-07-21 MED ORDER — METRONIDAZOLE 500 MG PO TABS: 500.0000 mg | ORAL_TABLET | Freq: Two times a day (BID) | ORAL | 0 refills | Status: DC

## 2018-07-21 NOTE — ED Triage Notes (Signed)
Pt here for white vag d/c onset 1 week associated itching  Also reports UTI sx x2 days associated w/dysuria.   Sexually active in monogamous relationship w/condom use.   Sts she gets BVs frequently.   A&O x4... NAD... Ambulatory

## 2018-07-21 NOTE — ED Provider Notes (Signed)
[facility]    CSN: [id number] Arrival date & time: 07/21/18  4098     History   Chief Complaint Chief Complaint  Patient presents with  . Urinary Tract Infection  . Vaginal Discharge    HPI Theresa Mooney is a 37 y.o. female.   Patient is a 37 year old female that presents today with vaginal discharge, odor, lower abdominal cramping for the past couple weeks.  Symptoms have been constant and remain the same.  She has not taken anything for symptoms.  She has history of recurrent BV.  She started with urinary frequency and dysuria yesterday.  Reports that her last menstrual period was around the sixth of this month.  She is currently sexually active with one partner sometimes protected.  She was just checked for STDs 1 month ago and treated for BV.  She denies any fever, chills, body aches, back pain. She has chronic hematuria and was seen by the urologist this week.   ROS per HPI    Urinary Tract Infection  Associated symptoms: vaginal discharge   Vaginal Discharge    Past Medical History:  Diagnosis Date  . 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 Elevated glucose   . Herpes 2002  . Infection   . Mixed hyperlipidemia   . Nicotine dependence   . Trichimoniasis 12-2010  . Urinary calculi     Patient Active Problem List   Diagnosis Date Noted  . h/o postpartum BTL  12/13/2011  . S/P ectopic pregnancy 12/13/2011    Past Surgical History:  Procedure Laterality Date  . DILATION AND CURETTAGE OF UTERUS    . LAPAROSCOPY  11/27/2011   Procedure: LAPAROSCOPY OPERATIVE;  Surgeon: Catalina Antigua, MD;  Location: [facility];  Service: Gynecology;  Laterality: Bilateral;  . SALPINGECTOMY     Bilateral for twin ectopic preg.   . TUBAL LIGATION  12-05-2010    OB History    Gravida  7   Para  5   Term      Preterm      AB  2   Living  5     SAB      TAB  1   Ectopic  1   Multiple      Live Births  1            Home Medications     Prior to Admission medications   Medication Sig Start Date End Date Taking? Authorizing Provider  sucralfate (CARAFATE) 1 g tablet Take 1 tablet (1 g total) by mouth 4 (four) times daily -  with meals and at bedtime. 05/04/18  Yes Upstill, Shari, PA-C  ASPIRIN PO Take 1 tablet by mouth daily.    [provider]  doxycycline (VIBRAMYCIN) 100 MG capsule Take 1 capsule (100 mg total) by mouth 2 (two) times daily. One po bid x 7 days 06/11/18   Melene Plan, DO  HYDROcodone-acetaminophen (NORCO/VICODIN) 5-325 MG tablet Take 1 tablet by mouth every 4 (four) hours as needed. Patient not taking: Reported on 06/13/2018 06/09/18   Jacalyn Lefevre, MD  methocarbamol (ROBAXIN) 500 MG tablet Take 1 tablet (500 mg total) by mouth 2 (two) times daily. 06/15/18   Dartha Lodge, PA-C  metroNIDAZOLE (FLAGYL) 500 MG tablet Take 1 tablet (500 mg total) by mouth 2 (two) times daily. 07/21/18   Dahlia Byes A, NP  naproxen (NAPROSYN) 500 MG tablet Take 1 tablet (500 mg total) by mouth 2 (two) times daily. 06/13/18  Sabas Sous, MD  pantoprazole (PROTONIX) 20 MG tablet Take 1 tablet (20 mg total) by mouth daily. 11/27/17 06/13/18  Long, Arlyss Repress, MD  predniSONE (DELTASONE) 10 MG tablet Take 4 tablets (40 mg total) by mouth daily. 06/24/18   Janne Napoleon, NP  ranitidine (ZANTAC) 150 MG tablet Take 1 tablet (150 mg total) by mouth 2 (two) times daily. 05/04/18   Elpidio Anis, PA-C    Family History Family History  Problem Relation Age of Onset  . Hypertension Mother   . Diabetes Maternal Grandmother   . Heart disease Maternal Grandmother   . Hypertension Maternal Grandmother   . Diabetes Maternal Grandfather   . Heart disease Maternal Grandfather   . Hypertension Maternal Grandfather   . Hyperlipidemia Daughter   . Anesthesia problems Neg Hx     Social History Social History   Tobacco Use  . Smoking status: Never Smoker  . Smokeless tobacco: Never Used  Substance Use Topics  . Alcohol use: Yes     Comment: social  . Drug use: No     Allergies   Hydrocodone   Review of Systems Review of Systems  Genitourinary: Positive for vaginal discharge.     Physical Exam Triage Vital Signs ED Triage Vitals [07/21/18 0850]  Enc Vitals Group     BP 92/68     Pulse Rate 69     Resp 16     Temp 98.2 F (36.8 C)     Temp src      SpO2 100 %     Weight      Height      Head Circumference      Peak Flow      Pain Score      Pain Loc      Pain Edu?      Excl. in GC?    No data found.  Updated Vital Signs BP 92/68 (BP Location: Right Arm) Comment: Reported BP to NP Trac Kelli Robeck  Pulse 69   Temp 98.2 F (36.8 C)   Resp 16   LMP 07/01/2018   SpO2 100%   Visual Acuity Right Eye Distance:   Left Eye Distance:   Bilateral Distance:    Right Eye Near:   Left Eye Near:    Bilateral Near:     Physical Exam  Constitutional: She appears well-developed and well-nourished.  Very pleasant. Non toxic or ill appearing.   HENT:  Head: Normocephalic and atraumatic.  Eyes: Conjunctivae are normal.  Neck: Normal range of motion.  Pulmonary/Chest: Effort normal.  Abdominal: Soft. Bowel sounds are normal.  Abdomen soft, non tender. No CVA tenderness. No rebound tenderness.   Genitourinary:  Genitourinary Comments: Deferred patient not having any abdominal pain, back pain, pelvic pain.  Musculoskeletal: Normal range of motion.  Neurological: She is alert.  Skin: Skin is warm and dry.  Psychiatric: She has a normal mood and affect.  Nursing note and vitals reviewed.    UC Treatments / Results  Labs (all labs ordered are listed, but only abnormal results are displayed) Labs Reviewed  POCT URINALYSIS DIP (DEVICE) - Abnormal; Notable for the following components:      Result Value   Hgb urine dipstick MODERATE (*)    All other components within normal limits  POCT PREGNANCY, URINE  CERVICOVAGINAL ANCILLARY ONLY    EKG None  Radiology No results  found.  Procedures Procedures (including critical care time)  Medications Ordered in UC Medications - No data  to display  Initial Impression / Assessment and Plan / UC Course  I have reviewed the triage vital signs and the nursing notes.  Pertinent labs & imaging results that were available during my care of the patient were reviewed by me and considered in my medical decision making (see chart for details).     Self swab obtained to test for STDs, BV, yeast. We will go ahead and treat for BV based on history and symptoms. Urine negative for infection or pregnancy. Final Clinical Impressions(s) / UC Diagnoses   Final diagnoses:  Chronic vaginitis     Discharge Instructions     We are treating you today for bacterial vaginosis Your urine was negative for pregnancy or infection We will call you with any positive results of your swab    ED Prescriptions    Medication Sig Dispense Auth. Provider   metroNIDAZOLE (FLAGYL) 500 MG tablet Take 1 tablet (500 mg total) by mouth 2 (two) times daily. 14 tablet Dahlia Byes A, NP     Controlled Substance Prescriptions [place] Controlled Substance Registry consulted? Not Applicable   Janace Aris, NP 07/21/18 [phone]

## 2018-07-21 NOTE — Discharge Instructions (Signed)
We are treating you today for bacterial vaginosis Your urine was negative for pregnancy or infection We will call you with any positive results of your swab

## 2018-07-25 LAB — CERVICOVAGINAL ANCILLARY ONLY
BACTERIAL VAGINITIS: POSITIVE — AB
CANDIDA VAGINITIS: NEGATIVE
CHLAMYDIA, DNA PROBE: NEGATIVE
NEISSERIA GONORRHEA: NEGATIVE
TRICH (WINDOWPATH): NEGATIVE

## 2018-07-26 ENCOUNTER — Telehealth (HOSPITAL_COMMUNITY): Payer: Self-pay | Admitting: *Deleted

## 2018-07-26 NOTE — Telephone Encounter (Signed)
Patient called with questions about her Pap smear result and left voicemail. Called patient back. Patient stated she got her Pap smear result through Mychart. Explained to patient that her Pap smear showed some abnormal cells and that it was sent for HPV typing. Let her know the HPV was negative. Explained to patient that she will need a Pap smear in one year since the HPV was negative. Told patient if she doesn't have insurance she can either come to a free Pap smear screening at the [facility] [address] or make an appointment with [facility].Told patient she can call me to schedule. Patient verbalized understanding.

## 2018-07-27 ENCOUNTER — Encounter (HOSPITAL_COMMUNITY): Payer: Self-pay | Admitting: Emergency Medicine

## 2018-07-27 ENCOUNTER — Telehealth (HOSPITAL_COMMUNITY): Payer: Self-pay | Admitting: *Deleted

## 2018-07-27 ENCOUNTER — Emergency Department (HOSPITAL_COMMUNITY)
Admission: EM | Admit: 2018-07-27 | Discharge: 2018-07-27 | Disposition: A | Discharge status: Home / Self Care | Payer: Self-pay | Attending: Emergency Medicine | Admitting: Emergency Medicine

## 2018-07-27 DIAGNOSIS — I10 Essential (primary) hypertension: Secondary | ICD-10-CM | POA: Insufficient documentation

## 2018-07-27 DIAGNOSIS — Z79899 Other long term (current) drug therapy: Secondary | ICD-10-CM | POA: Insufficient documentation

## 2018-07-27 DIAGNOSIS — R51 Headache: Secondary | ICD-10-CM | POA: Insufficient documentation

## 2018-07-27 MED ORDER — ONDANSETRON 4 MG PO TBDP: ORAL_TABLET | ORAL | 0 refills | Status: DC

## 2018-07-27 NOTE — ED Provider Notes (Signed)
[facility]EMER[facility]PT Provider Note   CSN: [id number] Arrival date & time: 07/27/18  4098     History   Chief Complaint Chief Complaint  Patient presents with  . Blood Pressure Check  . Headache    HPI Theresa Mooney is a 37 y.o. female.  Patient complains of mildly elevated blood pressure when she is at work.  She has had a mild headache that is improved.  The history is provided by the patient. No language interpreter was used.  Headache   This is a new problem. The current episode started less than 1 hour ago. Episode frequency: Rarely. The headache is associated with nothing. The pain is located in the left unilateral region. The quality of the pain is described as dull. The pain is at a severity of 1/10. The pain is mild. The pain does not radiate. She has tried nothing for the symptoms. The treatment provided no relief.    Past Medical History:  Diagnosis Date  . 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 Elevated glucose   . Herpes 2002  . Infection   . Mixed hyperlipidemia   . Nicotine dependence   . Trichimoniasis 12-2010  . Urinary calculi     Patient Active Problem List   Diagnosis Date Noted  . h/o postpartum BTL  12/13/2011  . S/P ectopic pregnancy 12/13/2011    Past Surgical History:  Procedure Laterality Date  . DILATION AND CURETTAGE OF UTERUS    . LAPAROSCOPY  11/27/2011   Procedure: LAPAROSCOPY OPERATIVE;  Surgeon: Catalina Antigua, MD;  Location: [facility];  Service: Gynecology;  Laterality: Bilateral;  . SALPINGECTOMY     Bilateral for twin ectopic preg.   . TUBAL LIGATION  12-05-2010     OB History    Gravida  7   Para  5   Term      Preterm      AB  2   Living  5     SAB      TAB  1   Ectopic  1   Multiple      Live Births  1            Home Medications    Prior to Admission medications   Medication Sig Start Date End Date Taking? Authorizing Provider  metroNIDAZOLE (FLAGYL) 500 MG tablet  Take 1 tablet (500 mg total) by mouth 2 (two) times daily. 07/21/18  Yes Bast, Traci A, NP  sucralfate (CARAFATE) 1 g tablet Take 1 tablet (1 g total) by mouth 4 (four) times daily -  with meals and at bedtime. Patient taking differently: Take 1 g by mouth 4 (four) times daily as needed (stomach ulcers).  05/04/18  Yes Upstill, Melvenia Beam, PA-C  doxycycline (VIBRAMYCIN) 100 MG capsule Take 1 capsule (100 mg total) by mouth 2 (two) times daily. One po bid x 7 days Patient not taking: Reported on 07/27/2018 06/11/18   Melene Plan, DO  HYDROcodone-acetaminophen (NORCO/VICODIN) 5-325 MG tablet Take 1 tablet by mouth every 4 (four) hours as needed. Patient not taking: Reported on 07/27/2018 06/09/18   Jacalyn Lefevre, MD  methocarbamol (ROBAXIN) 500 MG tablet Take 1 tablet (500 mg total) by mouth 2 (two) times daily. Patient not taking: Reported on 07/27/2018 06/15/18   Dartha Lodge, PA-C  naproxen (NAPROSYN) 500 MG tablet Take 1 tablet (500 mg total) by mouth 2 (two) times daily. Patient not taking: Reported on 07/27/2018 06/13/18   Sabas Sous,  MD  ondansetron (ZOFRAN ODT) 4 MG disintegrating tablet 4mg  ODT q4 hours prn nausea/vomit 07/27/18   Bethann Berkshire, MD  pantoprazole (PROTONIX) 20 MG tablet Take 1 tablet (20 mg total) by mouth daily. Patient not taking: Reported on 07/27/2018 11/27/17 06/13/18  Long, Arlyss Repress, MD  predniSONE (DELTASONE) 10 MG tablet Take 4 tablets (40 mg total) by mouth daily. Patient not taking: Reported on 07/27/2018 06/24/18   Janne Napoleon, NP  ranitidine (ZANTAC) 150 MG tablet Take 1 tablet (150 mg total) by mouth 2 (two) times daily. Patient not taking: Reported on 07/27/2018 05/04/18   Elpidio Anis, PA-C    Family History Family History  Problem Relation Age of Onset  . Hypertension Mother   . Diabetes Maternal Grandmother   . Heart disease Maternal Grandmother   . Hypertension Maternal Grandmother   . Diabetes Maternal Grandfather   . Heart disease Maternal  Grandfather   . Hypertension Maternal Grandfather   . Hyperlipidemia Daughter   . Anesthesia problems Neg Hx     Social History Social History   Tobacco Use  . Smoking status: Never Smoker  . Smokeless tobacco: Never Used  Substance Use Topics  . Alcohol use: Yes    Comment: social  . Drug use: No     Allergies   Hydrocodone   Review of Systems Review of Systems  Constitutional: Negative for appetite change and fatigue.  HENT: Negative for congestion, ear discharge and sinus pressure.   Eyes: Negative for discharge.  Respiratory: Negative for cough.   Cardiovascular: Negative for chest pain.  Gastrointestinal: Negative for abdominal pain and diarrhea.  Genitourinary: Negative for frequency and hematuria.  Musculoskeletal: Negative for back pain.  Skin: Negative for rash.  Neurological: Positive for headaches. Negative for seizures.  Psychiatric/Behavioral: Negative for hallucinations.     Physical Exam Updated Vital Signs BP (!) 129/58 (BP Location: Left Arm)   Pulse 77   Temp 98.2 F (36.8 C) (Oral)   LMP 07/01/2018   SpO2 100%   Physical Exam  Constitutional: She is oriented to person, place, and time. She appears well-developed.  HENT:  Head: Normocephalic.  Eyes: Conjunctivae and EOM are normal. No scleral icterus.  Neck: Neck supple. No thyromegaly present.  Cardiovascular: Normal rate and regular rhythm. Exam reveals no gallop and no friction rub.  No murmur heard. Pulmonary/Chest: No stridor. She has no wheezes. She has no rales. She exhibits no tenderness.  Abdominal: She exhibits no distension. There is no tenderness. There is no rebound.  Musculoskeletal: Normal range of motion. She exhibits no edema.  Lymphadenopathy:    She has no cervical adenopathy.  Neurological: She is oriented to person, place, and time. She exhibits normal muscle tone. Coordination normal.  Skin: No rash noted. No erythema.  Psychiatric: She has a normal mood and  affect. Her behavior is normal.     ED Treatments / Results  Labs (all labs ordered are listed, but only abnormal results are displayed) Labs Reviewed - No data to display  EKG None  Radiology No results found.  Procedures Procedures (including critical care time)  Medications Ordered in ED Medications - No data to display   Initial Impression / Assessment and Plan / ED Course  I have reviewed the triage vital signs and the nursing notes.  Pertinent labs & imaging results that were available during my care of the patient were reviewed by me and considered in my medical decision making (see chart for details).  Patient with headache that has improved.  Patient is concerned about her blood pressure but it really has not been too high or too low.  I told her to check her blood pressure twice a week at work and then give the results or Dr.  Final Clinical Impressions(s) / ED Diagnoses   Final diagnoses:  Hypertension, unspecified type    ED Discharge Orders         Ordered    ondansetron (ZOFRAN ODT) 4 MG disintegrating tablet     07/27/18 1026           Bethann Berkshire, MD 07/27/18 1029

## 2018-07-27 NOTE — Discharge Instructions (Signed)
Check your blood pressure twice a week and record the readings.  Then bring these readings to your doctor during your appointment.

## 2018-07-27 NOTE — Telephone Encounter (Signed)
Telephoned patient, left a message to return a call to [facility]. 

## 2018-07-27 NOTE — ED Triage Notes (Signed)
Pt reports that she been checking her BP randomly since last week and will range from 90s systolic to 160 systolic. Reports last night at work was 104/70. Yesterday earlier in the day had headaches and BP was 140-160s systolic. Reports that she recently got [facility] card and doesn't get in with PCP til November. Pt concerned about her BP fluctuating and having family Hx of HTN.

## 2018-07-31 ENCOUNTER — Encounter (HOSPITAL_COMMUNITY): Payer: Self-pay | Admitting: *Deleted

## 2018-07-31 ENCOUNTER — Other Ambulatory Visit: Payer: Self-pay

## 2018-07-31 ENCOUNTER — Ambulatory Visit (HOSPITAL_COMMUNITY)
Admission: EM | Admit: 2018-07-31 | Discharge: 2018-07-31 | Disposition: A | Discharge status: Home / Self Care | Payer: Self-pay | Attending: Emergency Medicine | Admitting: Emergency Medicine

## 2018-07-31 DIAGNOSIS — R51 Headache: Secondary | ICD-10-CM

## 2018-07-31 DIAGNOSIS — R519 Headache, unspecified: Secondary | ICD-10-CM

## 2018-07-31 MED ORDER — GUAIFENESIN ER 600 MG PO TB12: 1200.0000 mg | ORAL_TABLET | Freq: Two times a day (BID) | ORAL | 0 refills | Status: AC | PRN

## 2018-07-31 MED ORDER — KETOROLAC TROMETHAMINE 60 MG/2ML IM SOLN
INTRAMUSCULAR | Status: AC
  Filled 2018-07-31: qty 2

## 2018-07-31 MED ORDER — TRIAMCINOLONE ACETONIDE 40 MG/ML IJ SUSP
INTRAMUSCULAR | Status: AC
  Filled 2018-07-31: qty 2

## 2018-07-31 MED ORDER — FLUTICASONE PROPIONATE 50 MCG/ACT NA SUSP: 1.0000 | Freq: Every day | NASAL | 2 refills | Status: DC

## 2018-07-31 MED ORDER — KETOROLAC TROMETHAMINE 60 MG/2ML IM SOLN
60.0000 mg | Freq: Once | INTRAMUSCULAR | Status: AC
  Administered 2018-07-31: 60 mg via INTRAMUSCULAR

## 2018-07-31 MED ORDER — IBUPROFEN 800 MG PO TABS: 800.0000 mg | ORAL_TABLET | Freq: Three times a day (TID) | ORAL | 0 refills | Status: DC

## 2018-07-31 MED ORDER — TRIAMCINOLONE ACETONIDE 40 MG/ML IJ SUSP
80.0000 mg | Freq: Once | INTRAMUSCULAR | Status: AC
  Administered 2018-07-31: 80 mg via INTRAMUSCULAR

## 2018-07-31 NOTE — Discharge Instructions (Signed)
Push fluids to ensure adequate hydration and keep secretions thin.  Tylenol and/or ibuprofen as needed for pain or fevers. Don't take additional ibuprofen for another 8 hours  Daily flonase Mucinex to loosen secretions.  If symptoms worsen or do not improve in the next week to return to be seen or to follow up with your PCP.

## 2018-07-31 NOTE — ED Triage Notes (Signed)
C/o headache and photophobia , with nasal drainage onset 1 week ago.

## 2018-07-31 NOTE — ED Provider Notes (Signed)
[facility]    CSN: [id number] Arrival date & time: 07/31/18  0818     History   Chief Complaint Chief Complaint  Patient presents with  . Headache    HPI Theresa Mooney is a 37 y.o. female.   Theresa Mooney presents with complaints of headache with ear pressure, sore throat, facial pressure which started 1 week ago. Throbbing generalized pain but primarily to ears and face. Slight runny nose only. Occasional productive cough. Had similar a few months ago which resolved without specific treatment. Has had sinus headaches in the past. Slight dizziness. No gi/gu complaints. Pain 6/10, which is improved from last night. No fevers. Has taken sinus and cold OTC medications which have helped some. No loss of vision, confusion, neck pain.     ROS per HPI.      Past Medical History:  Diagnosis Date  . 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 Elevated glucose   . Herpes 2002  . Infection   . Mixed hyperlipidemia   . Nicotine dependence   . Trichimoniasis 12-2010  . Urinary calculi     Patient Active Problem List   Diagnosis Date Noted  . h/o postpartum BTL  12/13/2011  . S/P ectopic pregnancy 12/13/2011    Past Surgical History:  Procedure Laterality Date  . DILATION AND CURETTAGE OF UTERUS    . LAPAROSCOPY  11/27/2011   Procedure: LAPAROSCOPY OPERATIVE;  Surgeon: Catalina Antigua, MD;  Location: [facility];  Service: Gynecology;  Laterality: Bilateral;  . SALPINGECTOMY     Bilateral for twin ectopic preg.   . TUBAL LIGATION  12-05-2010    OB History    Gravida  7   Para  5   Term      Preterm      AB  2   Living  5     SAB      TAB  1   Ectopic  1   Multiple      Live Births  1            Home Medications    Prior to Admission medications   Medication Sig Start Date End Date Taking? Authorizing Provider  fluticasone (FLONASE) 50 MCG/ACT nasal spray Place 1 spray into both nostrils daily. 07/31/18   Georgetta Haber, NP  guaiFENesin  (MUCINEX) 600 MG 12 hr tablet Take 2 tablets (1,200 mg total) by mouth 2 (two) times daily as needed for up to 5 days. 07/31/18 08/05/18  Georgetta Haber, NP  ibuprofen (ADVIL,MOTRIN) 800 MG tablet Take 1 tablet (800 mg total) by mouth 3 (three) times daily. 07/31/18   Georgetta Haber, NP  metroNIDAZOLE (FLAGYL) 500 MG tablet Take 1 tablet (500 mg total) by mouth 2 (two) times daily. 07/21/18   Janace Aris, NP  ondansetron (ZOFRAN ODT) 4 MG disintegrating tablet 4mg  ODT q4 hours prn nausea/vomit 07/27/18   Bethann Berkshire, MD    Family History Family History  Problem Relation Age of Onset  . Hypertension Mother   . Diabetes Maternal Grandmother   . Heart disease Maternal Grandmother   . Hypertension Maternal Grandmother   . Diabetes Maternal Grandfather   . Heart disease Maternal Grandfather   . Hypertension Maternal Grandfather   . Hyperlipidemia Daughter   . Anesthesia problems Neg Hx     Social History Social History   Tobacco Use  . Smoking status: Never Smoker  . Smokeless tobacco: Never Used  Substance Use Topics  .  Alcohol use: Not Currently    Comment: social  . Drug use: No     Allergies   Hydrocodone   Review of Systems Review of Systems   Physical Exam Triage Vital Signs ED Triage Vitals  Enc Vitals Group     BP 07/31/18 0851 128/86     Pulse Rate 07/31/18 0851 75     Resp 07/31/18 0851 16     Temp 07/31/18 0851 98 F (36.7 C)     Temp Source 07/31/18 0851 Oral     SpO2 07/31/18 0851 100 %     Weight --      Height --      Head Circumference --      Peak Flow --      Pain Score 07/31/18 0853 6     Pain Loc --      Pain Edu? --      Excl. in GC? --    No data found.  Updated Vital Signs BP 128/86 (BP Location: Right Arm)   Pulse 75   Temp 98 F (36.7 C) (Oral)   Resp 16   LMP 07/30/2018 (Exact Date)   SpO2 100%    Physical Exam  Constitutional: She is oriented to person, place, and time. She appears well-developed and well-nourished.  No distress.  HENT:  Head: Normocephalic and atraumatic.  Right Ear: Tympanic membrane, external ear and ear canal normal.  Left Ear: Tympanic membrane, external ear and ear canal normal.  Nose: Nose normal.  Mouth/Throat: Uvula is midline, oropharynx is clear and moist and mucous membranes are normal. No tonsillar exudate.  Eyes: Pupils are equal, round, and reactive to light. Conjunctivae and EOM are normal.  Neck: Normal range of motion. No neck rigidity.  Cardiovascular: Normal rate, regular rhythm and normal heart sounds.  Pulmonary/Chest: Effort normal and breath sounds normal.  Lymphadenopathy:    She has no cervical adenopathy.  Neurological: She is alert and oriented to person, place, and time. She has normal strength. She is not disoriented. No cranial nerve deficit or sensory deficit.  Skin: Skin is warm and dry.     UC Treatments / Results  Labs (all labs ordered are listed, but only abnormal results are displayed) Labs Reviewed - No data to display  EKG None  Radiology No results found.  Procedures Procedures (including critical care time)  Medications Ordered in UC Medications  ketorolac (TORADOL) injection 60 mg (60 mg Intramuscular Given 07/31/18 0953)  triamcinolone acetonide (KENALOG-40) injection 80 mg (80 mg Intramuscular Given 07/31/18 0953)    Initial Impression / Assessment and Plan / UC Course  I have reviewed the triage vital signs and the nursing notes.  Pertinent labs & imaging results that were available during my care of the patient were reviewed by me and considered in my medical decision making (see chart for details).     No red flag findings. Consistent with sinus headache. Kenalog IM provided today as well as toradol. Continue with supportive cares. Return precautions provided. If symptoms worsen or do not improve in the next week to return to be seen or to follow up with PCP.  Patient verbalized understanding and agreeable to plan.    Final Clinical Impressions(s) / UC Diagnoses   Final diagnoses:  Sinus headache     Discharge Instructions     Push fluids to ensure adequate hydration and keep secretions thin.  Tylenol and/or ibuprofen as needed for pain or fevers. Don't take additional ibuprofen for another  8 hours  Daily flonase Mucinex to loosen secretions.  If symptoms worsen or do not improve in the next week to return to be seen or to follow up with your PCP.     ED Prescriptions    Medication Sig Dispense Auth. Provider   fluticasone (FLONASE) 50 MCG/ACT nasal spray Place 1 spray into both nostrils daily. 16 g Linus Mako B, NP   guaiFENesin (MUCINEX) 600 MG 12 hr tablet Take 2 tablets (1,200 mg total) by mouth 2 (two) times daily as needed for up to 5 days. 20 tablet Linus Mako B, NP   ibuprofen (ADVIL,MOTRIN) 800 MG tablet Take 1 tablet (800 mg total) by mouth 3 (three) times daily. 21 tablet Georgetta Haber, NP     Controlled Substance Prescriptions [place] Controlled Substance Registry consulted? Not Applicable   Georgetta Haber, NP 07/31/18 [phone]

## 2018-08-04 ENCOUNTER — Telehealth (HOSPITAL_COMMUNITY): Payer: Self-pay | Admitting: *Deleted

## 2018-08-04 NOTE — Telephone Encounter (Signed)
Called patient to discuss Pap smear result. Explained to patient that her Pap smear did show some abnormal cells and that her Pap smear was sent for HPV typing. Let her know that HPV typing was negative. Told patient that based on that result her next Pap smear is due in one year. Explained the HPV virus to patient. Patient verbalized understanding.

## 2018-08-08 ENCOUNTER — Ambulatory Visit (INDEPENDENT_AMBULATORY_CARE_PROVIDER_SITE_OTHER): Payer: Self-pay | Admitting: Physician Assistant

## 2018-08-08 ENCOUNTER — Other Ambulatory Visit: Payer: Self-pay

## 2018-08-08 ENCOUNTER — Encounter (INDEPENDENT_AMBULATORY_CARE_PROVIDER_SITE_OTHER): Payer: Self-pay | Admitting: Physician Assistant

## 2018-08-08 VITALS — BP 119/81 | HR 82 | Temp 97.8°F | Ht 60.5 in | Wt 202.0 lb

## 2018-08-08 DIAGNOSIS — R319 Hematuria, unspecified: Secondary | ICD-10-CM

## 2018-08-08 DIAGNOSIS — R202 Paresthesia of skin: Secondary | ICD-10-CM

## 2018-08-08 DIAGNOSIS — R1013 Epigastric pain: Secondary | ICD-10-CM

## 2018-08-08 DIAGNOSIS — Z3202 Encounter for pregnancy test, result negative: Secondary | ICD-10-CM

## 2018-08-08 DIAGNOSIS — R109 Unspecified abdominal pain: Secondary | ICD-10-CM

## 2018-08-08 DIAGNOSIS — R1030 Lower abdominal pain, unspecified: Secondary | ICD-10-CM

## 2018-08-08 DIAGNOSIS — R52 Pain, unspecified: Secondary | ICD-10-CM

## 2018-08-08 DIAGNOSIS — Z131 Encounter for screening for diabetes mellitus: Secondary | ICD-10-CM

## 2018-08-08 LAB — POCT URINALYSIS DIPSTICK
BILIRUBIN UA: NEGATIVE
Glucose, UA: NEGATIVE
Ketones, UA: NEGATIVE
LEUKOCYTES UA: NEGATIVE
NITRITE UA: NEGATIVE
PH UA: 7 (ref 5.0–8.0)
PROTEIN UA: NEGATIVE
Spec Grav, UA: 1.01 (ref 1.010–1.025)
UROBILINOGEN UA: 0.2 U/dL

## 2018-08-08 LAB — POCT GLYCOSYLATED HEMOGLOBIN (HGB A1C): Hemoglobin A1C: 4.8 % (ref 4.0–5.6)

## 2018-08-08 LAB — POCT URINE PREGNANCY: Preg Test, Ur: NEGATIVE

## 2018-08-08 MED ORDER — GABAPENTIN 300 MG PO CAPS: 300.0000 mg | ORAL_CAPSULE | Freq: Three times a day (TID) | ORAL | 3 refills | Status: DC

## 2018-08-08 MED ORDER — OMEPRAZOLE 40 MG PO CPDR: 40.0000 mg | DELAYED_RELEASE_CAPSULE | Freq: Every day | ORAL | 3 refills | Status: DC

## 2018-08-08 MED ORDER — ACETAMINOPHEN 500 MG PO TABS: 1000.0000 mg | ORAL_TABLET | Freq: Three times a day (TID) | ORAL | 0 refills | Status: DC | PRN

## 2018-08-08 NOTE — Patient Instructions (Signed)
Paresthesia Paresthesia is a burning or prickling feeling. This feeling can happen in any part of the body. It often happens in the hands, arms, legs, or feet. Usually, it is not painful. In most cases, the feeling goes away in a short time and is not a sign of a serious problem. Follow these instructions at home:  Avoid drinking alcohol.  Try massage or needle therapy (acupuncture) to help with your problems.  Keep all follow-up visits as told by your doctor. This is important. Contact a doctor if:  You keep on having episodes of paresthesia.  Your burning or prickling feeling gets worse when you walk.  You have pain or cramps.  You feel dizzy.  You have a rash. Get help right away if:  You feel weak.  You have trouble walking or moving.  You have problems speaking, understanding, or seeing.  You feel confused.  You cannot control when you pee (urinate) or poop (bowel movement).  You lose feeling (numbness) after an injury.  You pass out (faint). This information is not intended to replace advice given to you by your health care provider. Make sure you discuss any questions you have with your health care provider. Document Released: 08/26/2008 Document Revised: 02/19/2016 Document Reviewed: 09/09/2014 Elsevier Interactive Patient Education  2018 Elsevier Inc.  

## 2018-08-08 NOTE — Progress Notes (Signed)
Subjective:  Patient ID: Theresa Mooney, female    DOB: 10/04/1980  Age: 37 y.o. MRN: [id number]  CC: HA, back pain, flank pain. Throbbing, aching pain in legs, feet  HPI Theresa Mooney is a 37 y.o. female with a medical history of HLD, HSV, urinary calculi, ectopic pregnancy, Nicotine dependence, and Cannabis dependence presents as a new patient with complaint of headache, shoulder pain, back pain, knee pain, bilateral leg pain, bilateral hand and foot tingling/throbbing, epigastric pain, lower abdominal pain, and bilateral flank pain. Since greater than a year ago. Was told she might have fibromyalgia by her previous PCP. Has reportedly been evaluated by urology for lower abdominal/suprapubic pain with no findings. Has also reportedly been to GI with no findings on work up. Was unable to return to specialists due to loss of insurance. US abdomen 06/13/17 was unremarkable. CT renal stone from 11/27/17 revealed "Multiple bilateral nonobstructing renal calculi measuring up to 3 mm". Urinalysis today revealed moderate amount of blood. Pt finished menstrual period last week. States she has recently had urinary frequency but it seems to have self resolved.     In regards to epigastric pain, says pain is worse when she drinks alcohol, and eats chocolate. Stopped drinking due to epigastric pain. Of note, she takes Ibuprofen 800 mg for generalized myalgias and arthralgias. Denies melena, BRBPR, and abdominal bloating. Says she was tested for H pylori but was found to be negative. Has taken Zantac and Nexium with temporary relief. Does not currently take any antacid.       Outpatient Medications Prior to Visit  Medication Sig Dispense Refill  . fluticasone (FLONASE) 50 MCG/ACT nasal spray Place 1 spray into both nostrils daily. 16 g 2  . ibuprofen (ADVIL,MOTRIN) 800 MG tablet Take 1 tablet (800 mg total) by mouth 3 (three) times daily. 21 tablet 0  . ondansetron (ZOFRAN ODT) 4 MG disintegrating tablet 4mg   ODT q4 hours prn nausea/vomit (Patient not taking: Reported on 08/08/2018) 12 tablet 0  . metroNIDAZOLE (FLAGYL) 500 MG tablet Take 1 tablet (500 mg total) by mouth 2 (two) times daily. 14 tablet 0   No facility-administered medications prior to visit.      ROS Review of Systems  Constitutional: Negative for chills, fever and malaise/fatigue.  Eyes: Negative for blurred vision.  Respiratory: Negative for shortness of breath.   Cardiovascular: Negative for chest pain and palpitations.  Gastrointestinal: Positive for abdominal pain. Negative for blood in stool, constipation, diarrhea, melena and nausea.  Genitourinary: Negative for dysuria and hematuria.  Musculoskeletal: Positive for back pain, joint pain and myalgias.  Skin: Negative for rash.  Neurological: Positive for headaches. Negative for tingling.  Psychiatric/Behavioral: Negative for depression. The patient is not nervous/anxious.     Objective:  BP 119/81 (BP Location: Left Arm, Patient Position: Sitting, Cuff Size: Large)   Pulse 82   Temp 97.8 F (36.6 C) (Oral)   Ht 5' 0.5" (1.537 m)   Wt 202 lb (91.6 kg)   LMP 07/30/2018 (Exact Date)   SpO2 98%   BMI 38.80 kg/m   BP/Weight 08/08/2018 07/31/2018 07/27/2018  Systolic BP 119 128 129  Diastolic BP 81 86 58  Wt. (Lbs) 202 - -  BMI 38.8 - -      Physical Exam  Constitutional: She is oriented to person, place, and time.  Well developed, obese, NAD, polite  HENT:  Head: Normocephalic and atraumatic.  Eyes: No scleral icterus.  Neck: Normal range of motion. Neck supple. No  thyromegaly present.  Cardiovascular: Normal rate, regular rhythm and normal heart sounds.  Pulmonary/Chest: Effort normal and breath sounds normal.  Abdominal: Soft. Bowel sounds are normal. There is tenderness (epigastric and hypogastric TTP).  Genitourinary:  Genitourinary Comments: No CVA TTP  Musculoskeletal: She exhibits no edema.  Neurological: She is alert and oriented to person,  place, and time.  Skin: Skin is warm and dry. No rash noted. No erythema. No pallor.  Psychiatric: She has a normal mood and affect. Her behavior is normal. Thought content normal.  Vitals reviewed.    Assessment & Plan:    1. Screening for diabetes mellitus - HgB A1c 4.8%  2. Flank pain - Urinalysis Dipstick with moderate blood. Unsure if due to renal calculi or menstrual contamination. Pt reportedly had negative work up at urology. - Begin acetaminophen (TYLENOL) 500 MG tablet; Take 2 tablets (1,000 mg total) by mouth every 8 (eight) hours as needed for up to 7 days.  Dispense: 21 tablet; Refill: 0  3. Lower abdominal pain - POCT urine pregnancy negative - Begin acetaminophen (TYLENOL) 500 MG tablet; Take 2 tablets (1,000 mg total) by mouth every 8 (eight) hours as needed for up to 7 days.  Dispense: 21 tablet; Refill: 0  4. Abdominal pain, epigastric - Begin omeprazole (PRILOSEC) 40 MG capsule; Take 1 capsule (40 mg total) by mouth daily.  Dispense: 30 capsule; Refill: 3 - Stop Ibuprofen 800 mg  5. Hematuria, unspecified type - Unsure if due to renal calculi or menstrual contamination. Pt reportedly had negative work up at urology. Will consider CT abdomen and pelvis. Pt unable to afford imaging at this time. I have given her an application for the [facility].  6. Paresthesia - Unknown etiology at this point. Will continue to work up as appropriate.  - Begin gabapentin (NEURONTIN) 300 MG capsule; Take 1 capsule (300 mg total) by mouth 3 (three) times daily.  Dispense: 90 capsule; Refill: 3  7. Generalized body aches - ANA w/Reflex - Sedimentation Rate - C-reactive protein - Basic Metabolic Panel   Meds ordered this encounter  Medications  . omeprazole (PRILOSEC) 40 MG capsule    Sig: Take 1 capsule (40 mg total) by mouth daily.    Dispense:  30 capsule    Refill:  3    Order Specific Question:   Supervising Provider    Answer:   Hoy Register [[id number]]   . acetaminophen (TYLENOL) 500 MG tablet    Sig: Take 2 tablets (1,000 mg total) by mouth every 8 (eight) hours as needed for up to 7 days.    Dispense:  21 tablet    Refill:  0    Order Specific Question:   Supervising Provider    Answer:   Hoy Register [[id number]]  . gabapentin (NEURONTIN) 300 MG capsule    Sig: Take 1 capsule (300 mg total) by mouth 3 (three) times daily.    Dispense:  90 capsule    Refill:  3    Order Specific Question:   Supervising Provider    Answer:   Hoy Register [[id number]]    Follow-up: Return in about 4 weeks (around 09/05/2018) for multiple symptoms.   Loletta Specter PA

## 2018-08-09 ENCOUNTER — Other Ambulatory Visit (INDEPENDENT_AMBULATORY_CARE_PROVIDER_SITE_OTHER): Payer: Self-pay | Admitting: Physician Assistant

## 2018-08-09 ENCOUNTER — Telehealth (INDEPENDENT_AMBULATORY_CARE_PROVIDER_SITE_OTHER): Payer: Self-pay

## 2018-08-09 DIAGNOSIS — R1013 Epigastric pain: Secondary | ICD-10-CM

## 2018-08-09 DIAGNOSIS — R1030 Lower abdominal pain, unspecified: Secondary | ICD-10-CM

## 2018-08-09 DIAGNOSIS — R109 Unspecified abdominal pain: Secondary | ICD-10-CM

## 2018-08-09 DIAGNOSIS — R202 Paresthesia of skin: Secondary | ICD-10-CM

## 2018-08-09 LAB — BASIC METABOLIC PANEL
BUN/Creatinine Ratio: 12 (ref 9–23)
BUN: 10 mg/dL (ref 6–20)
CALCIUM: 9.4 mg/dL (ref 8.7–10.2)
CHLORIDE: 102 mmol/L (ref 96–106)
CO2: 24 mmol/L (ref 20–29)
CREATININE: 0.81 mg/dL (ref 0.57–1.00)
GFR calc Af Amer: 110 mL/min/{1.73_m2} (ref >59)
GFR calc non Af Amer: 95 mL/min/{1.73_m2} (ref >59)
GLUCOSE: 83 mg/dL (ref 65–99)
Potassium: 4.4 mmol/L (ref 3.5–5.2)
Sodium: 141 mmol/L (ref 134–144)

## 2018-08-09 LAB — SEDIMENTATION RATE: Sed Rate: 37 mm/hr — ABNORMAL HIGH (ref 0–32)

## 2018-08-09 LAB — ANA W/REFLEX: Anti Nuclear Antibody(ANA): NEGATIVE

## 2018-08-09 LAB — C-REACTIVE PROTEIN: CRP: 8 mg/L (ref 0–10)

## 2018-08-09 MED ORDER — OMEPRAZOLE 40 MG PO CPDR: 40.0000 mg | DELAYED_RELEASE_CAPSULE | Freq: Every day | ORAL | 3 refills | Status: DC

## 2018-08-09 MED ORDER — GABAPENTIN 300 MG PO CAPS: 300.0000 mg | ORAL_CAPSULE | Freq: Three times a day (TID) | ORAL | 3 refills | Status: DC

## 2018-08-09 MED ORDER — ACETAMINOPHEN 500 MG PO TABS: 1000.0000 mg | ORAL_TABLET | Freq: Three times a day (TID) | ORAL | 0 refills | Status: AC | PRN

## 2018-08-09 MED FILL — OMEPRAZOLE DR 40 MG CAPSULE: 40 | 30 days supply | Qty: 30 | Fill #0

## 2018-08-09 MED FILL — GABAPENTIN 300 MG CAPSULE: 300 | 30 days supply | Qty: 90 | Fill #0

## 2018-08-09 NOTE — Telephone Encounter (Signed)
I have sent scripts to [facility].

## 2018-08-09 NOTE — Telephone Encounter (Signed)
Patient is aware that all prescriptions have been sent to [facility]. Maryjean Mornempestt S Shizuko Wojdyla, CMA

## 2018-08-09 NOTE — Telephone Encounter (Signed)
Patient is aware that labs show slight signs of chronic inflammation but no autoimmune disease. PCP will see patient on follow up and consider imaging if symptoms persist. Maryjean Mornempestt S , CMA

## 2018-08-09 NOTE — Telephone Encounter (Signed)
-----   Message from Loletta Specteroger David Gomez, PA-C sent at 08/09/2018 12:15 PM EST ----- Slight sign of chronic inflammation but no autoimmune disease. Will see patient on f/u and consider imaging if symptoms persist.

## 2018-08-09 NOTE — Telephone Encounter (Signed)
[facility]. Maryjean Mornempestt S Roberts, CMA

## 2018-08-11 ENCOUNTER — Emergency Department (HOSPITAL_COMMUNITY)
Admission: EM | Admit: 2018-08-11 | Discharge: 2018-08-12 | Disposition: A | Discharge status: Home / Self Care | Payer: Self-pay | Attending: Emergency Medicine | Admitting: Emergency Medicine

## 2018-08-11 ENCOUNTER — Encounter (HOSPITAL_COMMUNITY): Payer: Self-pay | Admitting: Emergency Medicine

## 2018-08-11 DIAGNOSIS — R51 Headache: Secondary | ICD-10-CM | POA: Insufficient documentation

## 2018-08-11 DIAGNOSIS — R6884 Jaw pain: Secondary | ICD-10-CM | POA: Insufficient documentation

## 2018-08-11 DIAGNOSIS — G8929 Other chronic pain: Secondary | ICD-10-CM | POA: Insufficient documentation

## 2018-08-11 DIAGNOSIS — M545 Low back pain: Secondary | ICD-10-CM | POA: Insufficient documentation

## 2018-08-11 DIAGNOSIS — R1013 Epigastric pain: Secondary | ICD-10-CM | POA: Insufficient documentation

## 2018-08-11 DIAGNOSIS — Z79899 Other long term (current) drug therapy: Secondary | ICD-10-CM | POA: Insufficient documentation

## 2018-08-11 LAB — COMPREHENSIVE METABOLIC PANEL
ALBUMIN: 4.3 g/dL (ref 3.5–5.0)
ALT: 15 U/L (ref 0–44)
AST: 19 U/L (ref 15–41)
Alkaline Phosphatase: 75 U/L (ref 38–126)
Anion gap: 11 (ref 5–15)
BUN: 20 mg/dL (ref 6–20)
CHLORIDE: 100 mmol/L (ref 98–111)
CO2: 23 mmol/L (ref 22–32)
Calcium: 8.9 mg/dL (ref 8.9–10.3)
Creatinine, Ser: 0.82 mg/dL (ref 0.44–1.00)
GFR calc Af Amer: >60 mL/min (ref >60)
Glucose, Bld: 86 mg/dL (ref 70–99)
Potassium: 3.8 mmol/L (ref 3.5–5.1)
SODIUM: 134 mmol/L — AB (ref 135–145)
Total Bilirubin: 0.5 mg/dL (ref 0.3–1.2)
Total Protein: 7.8 g/dL (ref 6.5–8.1)

## 2018-08-11 LAB — CBC
HEMATOCRIT: 40 % (ref 36.0–46.0)
Hemoglobin: 11.7 g/dL — ABNORMAL LOW (ref 12.0–15.0)
MCH: 27.2 pg (ref 26.0–34.0)
MCHC: 29.3 g/dL — AB (ref 30.0–36.0)
MCV: 93 fL (ref 80.0–100.0)
Platelets: 171 10*3/uL (ref 150–400)
RBC: 4.3 MIL/uL (ref 3.87–5.11)
RDW: 13.9 % (ref 11.5–15.5)
WBC: 6.6 10*3/uL (ref 4.0–10.5)
nRBC: 0 % (ref 0.0–0.2)

## 2018-08-11 LAB — URINALYSIS, ROUTINE W REFLEX MICROSCOPIC
BILIRUBIN URINE: NEGATIVE
Glucose, UA: NEGATIVE mg/dL
Ketones, ur: 5 mg/dL — AB
LEUKOCYTES UA: NEGATIVE
NITRITE: NEGATIVE
PH: 5 (ref 5.0–8.0)
Protein, ur: NEGATIVE mg/dL
SPECIFIC GRAVITY, URINE: 1.014 (ref 1.005–1.030)

## 2018-08-11 LAB — LIPASE, BLOOD: LIPASE: 29 U/L (ref 11–51)

## 2018-08-11 LAB — PREGNANCY, URINE: Preg Test, Ur: NEGATIVE

## 2018-08-11 MED ORDER — DIAZEPAM 5 MG PO TABS
5.0000 mg | ORAL_TABLET | Freq: Once | ORAL | Status: AC
  Administered 2018-08-11: 5 mg via ORAL
  Filled 2018-08-11: qty 1

## 2018-08-11 MED ORDER — LIDOCAINE 5 % EX PTCH
1.0000 | MEDICATED_PATCH | CUTANEOUS | Status: DC
  Administered 2018-08-11: 1 via TRANSDERMAL
  Filled 2018-08-11: qty 1

## 2018-08-11 MED ORDER — TRAMADOL HCL 50 MG PO TABS
50.0000 mg | ORAL_TABLET | Freq: Once | ORAL | Status: AC
  Administered 2018-08-11: 50 mg via ORAL
  Filled 2018-08-11: qty 1

## 2018-08-11 MED ORDER — ALUM & MAG HYDROXIDE-SIMETH 200-200-20 MG/5ML PO SUSP
30.0000 mL | Freq: Once | ORAL | Status: AC
  Administered 2018-08-11: 30 mL via ORAL
  Filled 2018-08-11: qty 30

## 2018-08-11 MED ORDER — ACETAMINOPHEN 500 MG PO TABS
1000.0000 mg | ORAL_TABLET | Freq: Once | ORAL | Status: AC
  Administered 2018-08-11: 1000 mg via ORAL
  Filled 2018-08-11: qty 2

## 2018-08-11 NOTE — ED Notes (Signed)
Pt attempting to give urine sample

## 2018-08-11 NOTE — ED Triage Notes (Signed)
Patient here from home with complaints of bilateral dental pain, abd pain, back pain, and headache. States that she was seen by PCP and had blood work done and was told that she has "chronic inflammation ".

## 2018-08-11 NOTE — ED Notes (Signed)
Pt given water for PO challenge 

## 2018-08-11 NOTE — ED Provider Notes (Signed)
Patient signed out to me by Jefm BryantGekas at shift change.  Patient with multiple vague complaints.  VSS.  NAD.  Screening labs are pending.   Anticipate discharge.  Labs are reassuring.  DC to home with PCP follow-up.   Roxy HorsemanBrowning, Javar Eshbach, PA-C 08/12/18 0008    Marily MemosMesner, Jason, MD 08/12/18 [phone]

## 2018-08-11 NOTE — ED Provider Notes (Signed)
[facility]-EMERGENCY DEPT Provider Note   CSN: [id number] Arrival date & time: 08/11/18  1942    History   Chief Complaint Chief Complaint  Patient presents with  . Dental Pain  . Headache  . Abdominal Pain  . Back Pain    HPI Theresa Mooney is a 37 y.o. female who presents with multiple complaints. PMH significant for kidney stones, hx of ectopic pregnancy, herpes.  Dental/jaw pain/headache - states this has been going on a couple days. It is over bilateral jaws. She thought it was a sinus infection and has been treating her congestion which helps her headaches but not her dental pain. The pain radiates throughtout her head and to her ears. She hasn't seen a dentist in years. She has the orange card and is working on seeing a dentist. No fevers or difficulty swallowing. She was seen at [facility] for a sinus headache on 11/4. No vision changes, dizziness, syncope.  Back pain: This is a chronic problem. It has been going on for months. It is over the middle of her back. It radiates around to her bilateral flanks. She works as a Financial risk analyst and standing for long periods of time make it worse. Heating pads and rest make it better. No bowel/bladder incontinence. No numbness/tingling, leg weakness. She had an ANA, CRP, and sed rate drawn at her last visit since she had polyarthralgias which showed normal CRP and ANA but sed rate was slightly elevated (37)  Abdominal pain: This is a chronic problem. She is being seen by her PCP for this. She was prescribed Gabapentin and Omeprazole and just got these from the pharmacy yesterday. The pain is in her epigastric area and sometimes in the lower abdomen. She is being referred to GI for this problem. No nausea, vomiting, diarrhea, constipation, urinary symptoms, vaginal discharge or bleeding. Past surgical hx remarkable for tubal ligation. Her PCP told her she has chronic inflammation because her Sed rate came back elevated. She has also seen  urology for blood in the urine and sounds like she had a cystoscopy which was normal. CT in March was negative.   HPI  Past Medical History:  Diagnosis Date  . 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 Elevated glucose   . Herpes 2002  . Infection   . Mixed hyperlipidemia   . Nicotine dependence   . Trichimoniasis 12-2010  . Urinary calculi     Patient Active Problem List   Diagnosis Date Noted  . h/o postpartum BTL  12/13/2011  . S/P ectopic pregnancy 12/13/2011    Past Surgical History:  Procedure Laterality Date  . DILATION AND CURETTAGE OF UTERUS    . LAPAROSCOPY  11/27/2011   Procedure: LAPAROSCOPY OPERATIVE;  Surgeon: Catalina Antigua, MD;  Location: [facility];  Service: Gynecology;  Laterality: Bilateral;  . SALPINGECTOMY     Bilateral for twin ectopic preg.   . TUBAL LIGATION  12-05-2010     OB History    Gravida  7   Para  5   Term      Preterm      AB  2   Living  5     SAB      TAB  1   Ectopic  1   Multiple      Live Births  1            Home Medications    Prior to Admission medications   Medication Sig Start Date End Date  Taking? Authorizing Provider  acetaminophen (TYLENOL) 500 MG tablet Take 2 tablets (1,000 mg total) by mouth every 8 (eight) hours as needed for up to 7 days. 08/09/18 08/16/18  Loletta SpecterGomez, Roger David, PA-C  fluticasone [facility](FLONASE) 50 MCG/ACT nasal spray Place 1 spray into both nostrils daily. 07/31/18   Georgetta HaberBurky, Natalie B, NP  gabapentin (NEURONTIN) 300 MG capsule Take 1 capsule (300 mg total) by mouth 3 (three) times daily. 08/09/18   Loletta SpecterGomez, Roger David, PA-C  omeprazole (PRILOSEC) 40 MG capsule Take 1 capsule (40 mg total) by mouth daily. 08/09/18   Loletta SpecterGomez, Roger David, PA-C    Family History Family History  Problem Relation Age of Onset  . Hypertension Mother   . Diabetes Maternal Grandmother   . Heart disease Maternal Grandmother   . Hypertension Maternal Grandmother   . Diabetes Maternal Grandfather   . Heart  disease Maternal Grandfather   . Hypertension Maternal Grandfather   . Hyperlipidemia Daughter   . Anesthesia problems Neg Hx     Social History Social History   Tobacco Use  . Smoking status: Never Smoker  . Smokeless tobacco: Never Used  Substance Use Topics  . Alcohol use: Not Currently    Comment: social  . Drug use: No     Allergies   Hydrocodone   Review of Systems Review of Systems  Constitutional: Negative for fever.  HENT: Positive for dental problem. Negative for ear pain.   Respiratory: Negative for shortness of breath.   Cardiovascular: Negative for chest pain.  Gastrointestinal: Positive for abdominal pain. Negative for constipation, diarrhea, nausea and vomiting.  Genitourinary: Negative for dysuria, frequency, vaginal bleeding and vaginal discharge.  Musculoskeletal: Positive for arthralgias, back pain and myalgias.  Skin: Negative for wound.  Neurological: Positive for headaches.  All other systems reviewed and are negative.    Physical Exam Updated Vital Signs BP 122/90 (BP Location: Left Arm)   Pulse 93   Temp 98.8 F (37.1 C) (Oral)   Resp 16   LMP 07/30/2018 (Exact Date)   SpO2 99%   Physical Exam  Constitutional: She is oriented to person, place, and time. She appears well-developed and well-nourished. No distress.  Calm and cooperative. Well appearing  HENT:  Head: Normocephalic and atraumatic.  Teeth are normal appearing. Bilateral TMJ tenderness  Eyes: Pupils are equal, round, and reactive to light. Conjunctivae are normal. Right eye exhibits no discharge. Left eye exhibits no discharge. No scleral icterus.  Neck: Normal range of motion.  Cardiovascular: Normal rate and regular rhythm.  Pulmonary/Chest: Effort normal and breath sounds normal. No respiratory distress.  Abdominal: Soft. Bowel sounds are normal. She exhibits no distension. There is no tenderness (very mild epigastric tenderness).  Neurological: She is alert and oriented  to person, place, and time.  Skin: Skin is warm and dry.  Psychiatric: She has a normal mood and affect. Her behavior is normal.  Nursing note and vitals reviewed.    ED Treatments / Results  Labs (all labs ordered are listed, but only abnormal results are displayed) Labs Reviewed  LIPASE, BLOOD  COMPREHENSIVE METABOLIC PANEL  CBC  URINALYSIS, ROUTINE W REFLEX MICROSCOPIC  PREGNANCY, URINE    EKG None  Radiology No results found.  Procedures Procedures (including critical care time)  Medications Ordered in ED Medications  lidocaine (LIDODERM) 5 % 1 patch (1 patch Transdermal Patch Applied 08/11/18 2224)  acetaminophen (TYLENOL) tablet 1,000 mg (1,000 mg Oral Given 08/11/18 2221)  diazepam (VALIUM) tablet 5 mg (5 mg Oral  Given 08/11/18 2221)  traMADol (ULTRAM) tablet 50 mg (50 mg Oral Given 08/11/18 2221)  alum & mag hydroxide-simeth (MAALOX/MYLANTA) 200-200-20 MG/5ML suspension 30 mL (30 mLs Oral Given 08/11/18 2221)     Initial Impression / Assessment and Plan / ED Course  I have reviewed the triage vital signs and the nursing notes.  Pertinent labs & imaging results that were available during my care of the patient were reviewed by me and considered in my medical decision making (see chart for details).  37 year old female presents with multiple complaints. They are being worked up as an outpatient and she states that she was just in more pain tonight so came to the ED. Her vitals are normal. I wonder that due to her multiple somatic complaints that stress may be playing a role. She was given medications for MSK and abdominal pain. At shift change her labs are "collected" and still not in process. Care transferred to [facility] who will dispo. Anticipate d/c with close PCP f/u.  Final Clinical Impressions(s) / ED Diagnoses   Final diagnoses:  Jaw pain  Epigastric pain  Chronic bilateral low back pain without sciatica    ED Discharge Orders    None         Bethel Born, PA-C 08/11/18 2253    Lorre Nick, MD 08/14/18 1327

## 2018-08-11 NOTE — ED Notes (Signed)
Unable to obtain IV access, attempted x2, Judeth CornfieldStephanie, RN attempted x2 as well. Butterfly stuck for labs.

## 2018-08-11 NOTE — ED Notes (Signed)
Pt's bloodwork and urine sent to lab for testing.

## 2018-08-11 NOTE — ED Notes (Signed)
Pt attempted to give urine sample with no success.

## 2018-08-12 NOTE — Discharge Instructions (Signed)
Consider seeing a dentist for your jaw pain.

## 2018-08-16 ENCOUNTER — Encounter (HOSPITAL_COMMUNITY): Payer: Self-pay | Admitting: *Deleted

## 2018-08-16 ENCOUNTER — Inpatient Hospital Stay (HOSPITAL_COMMUNITY)
Admission: AD | Admit: 2018-08-16 | Discharge: 2018-08-16 | Disposition: A | Discharge status: Home / Self Care | Payer: Self-pay | Source: Ambulatory Visit | Attending: Obstetrics & Gynecology | Admitting: Obstetrics & Gynecology

## 2018-08-16 DIAGNOSIS — N760 Acute vaginitis: Secondary | ICD-10-CM

## 2018-08-16 DIAGNOSIS — Z3202 Encounter for pregnancy test, result negative: Secondary | ICD-10-CM

## 2018-08-16 DIAGNOSIS — B9689 Other specified bacterial agents as the cause of diseases classified elsewhere: Secondary | ICD-10-CM

## 2018-08-16 DIAGNOSIS — R109 Unspecified abdominal pain: Secondary | ICD-10-CM | POA: Insufficient documentation

## 2018-08-16 LAB — URINALYSIS, ROUTINE W REFLEX MICROSCOPIC
Bacteria, UA: NONE SEEN
Bilirubin Urine: NEGATIVE
GLUCOSE, UA: NEGATIVE mg/dL
Ketones, ur: NEGATIVE mg/dL
Leukocytes, UA: NEGATIVE
NITRITE: NEGATIVE
Protein, ur: NEGATIVE mg/dL
SPECIFIC GRAVITY, URINE: 1.02 (ref 1.005–1.030)
pH: 5 (ref 5.0–8.0)

## 2018-08-16 LAB — WET PREP, GENITAL
Sperm: NONE SEEN
TRICH WET PREP: NONE SEEN
Yeast Wet Prep HPF POC: NONE SEEN

## 2018-08-16 LAB — POCT PREGNANCY, URINE: Preg Test, Ur: NEGATIVE

## 2018-08-16 MED ORDER — CLINDAMYCIN HCL 300 MG PO CAPS: 300.0000 mg | ORAL_CAPSULE | Freq: Two times a day (BID) | ORAL | 0 refills | Status: DC

## 2018-08-16 NOTE — MAU Provider Note (Signed)
Chief Complaint: sore nipples; Back Pain; and Abdominal Pain   First Provider Initiated Contact with Patient 08/16/18 0858     SUBJECTIVE HPI: Theresa Mooney is a 37 y.o. nonpregnant female who presents to [facility] reporting abdominal pain, back pain, and breast pain. She has had 8 ED & 3 urgent care visits since August for multiple complaints including abdominal pain & back pain. She was seen by her PCP last week & has follow up with them 12/11. She has a GI appt tomorrow morning for abdominal pain. Hx of kidney stones diagnosed this year with f/u at urologist. Per her PCP, she needs to f/u with urology for kidney stones & persistent hematuria.   Patient states she is here to make sure she's not pregnant & to evaluate for vaginal discharge. Concerned for pregnancy d/t abdominal cramping & nipple soreness. With further questioning, she states her breasts feels like they sometimes due when her period is about to start. Denies feeling any masses, and no nipple drainage.  Patient with hx of bilateral salpingectomy d/t bilateral ectopic pregnancy in 2013.  Currently in monogamous relationships & denies concern for STI.  Was treated for BV last month but reports the same discharge has returned.  Denies fever/chills, n/v/d, dysuria, or vaginal bleeding.   Location: breast, abdomen, back Quality: cramping, sore Severity: 6/10 on pain scale Duration: breast=3 days, abd/back pain = 3 months Timing: constant Modifying factors: nothing makes better or worse Associated signs and symptoms: vaginal discharge  Past Medical History:  Diagnosis Date  . 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 Elevated glucose   . Herpes 2002  . Infection   . Mixed hyperlipidemia   . Nicotine dependence   . Trichimoniasis 12-2010  . Urinary calculi    OB History  Gravida Para Term Preterm AB Living  7 5     2 5   SAB TAB Ectopic Multiple Live Births    1 1   1     # Outcome Date GA Lbr Len/2nd Weight  Sex Delivery Anes PTL Lv  7 Para           6 Para           5 Para           4 Para           3 Para         LIV  2 TAB           1 Ectopic            Past Surgical History:  Procedure Laterality Date  . DILATION AND CURETTAGE OF UTERUS    . LAPAROSCOPY  11/27/2011   Procedure: LAPAROSCOPY OPERATIVE;  Surgeon: Catalina Antigua, MD;  Location: [facility];  Service: Gynecology;  Laterality: Bilateral;  . SALPINGECTOMY     Bilateral for twin ectopic preg.   . TUBAL LIGATION  12-05-2010   Social History   Socioeconomic History  . Marital status: Single    Spouse name: Not on file  . Number of children: Not on file  . Years of education: Not on file  . Highest education level: Not on file  Occupational History  . Not on file  Social Needs  . Financial resource strain: Not on file  . Food insecurity:    Worry: Not on file    Inability: Not on file  . Transportation needs:    Medical: Not on file    Non-medical: Not on file  Tobacco Use  . Smoking status: Never Smoker  . Smokeless tobacco: Never Used  Substance and Sexual Activity  . Alcohol use: Not Currently    Comment: social  . Drug use: No  . Sexual activity: Yes    Birth control/protection: Surgical  Lifestyle  . Physical activity:    Days per week: Not on file    Minutes per session: Not on file  . Stress: Not on file  Relationships  . Social connections:    Talks on phone: Not on file    Gets together: Not on file    Attends religious service: Not on file    Active member of club or organization: Not on file    Attends meetings of clubs or organizations: Not on file    Relationship status: Not on file  . Intimate partner violence:    Fear of current or ex partner: Not on file    Emotionally abused: Not on file    Physically abused: Not on file    Forced sexual activity: Not on file  Other Topics Concern  . Not on file  Social History Narrative  . Not on file   Family History  Problem Relation Age of Onset   . Hypertension Mother   . Diabetes Maternal Grandmother   . Heart disease Maternal Grandmother   . Hypertension Maternal Grandmother   . Diabetes Maternal Grandfather   . Heart disease Maternal Grandfather   . Hypertension Maternal Grandfather   . Hyperlipidemia Daughter   . Anesthesia problems Neg Hx    No current facility-administered medications on file prior to encounter.    Current Outpatient Medications on File Prior to Encounter  Medication Sig Dispense Refill  . acetaminophen (TYLENOL) 500 MG tablet Take 2 tablets (1,000 mg total) by mouth every 8 (eight) hours as needed for up to 7 days. (Patient not taking: Reported on 08/11/2018) 21 tablet 0  . fluticasone (FLONASE) 50 MCG/ACT nasal spray Place 1 spray into both nostrils daily. (Patient taking differently: Place 1 spray into both nostrils daily as needed for allergies. ) 16 g 2  . gabapentin (NEURONTIN) 300 MG capsule Take 1 capsule (300 mg total) by mouth 3 (three) times daily. 90 capsule 3  . omeprazole (PRILOSEC) 40 MG capsule Take 1 capsule (40 mg total) by mouth daily. 30 capsule 3   Allergies  Allergen Reactions  . Hydrocodone Nausea Only    Dizziness    I have reviewed patient's Past Medical Hx, Surgical Hx, Family Hx, Social Hx, medications and allergies.   Review of Systems  Constitutional: Negative.   Gastrointestinal: Positive for abdominal pain. Negative for constipation, diarrhea, nausea and vomiting.  Genitourinary: Positive for vaginal discharge. Negative for dyspareunia, dysuria, flank pain, frequency and vaginal bleeding.  Musculoskeletal: Positive for back pain.    OBJECTIVE Patient Vitals for the past 24 hrs:  BP Temp Pulse Resp Weight  08/16/18 1052 119/72 - 71 18 -  08/16/18 0822 134/86 98.3 F (36.8 C) 75 18 91.6 kg   Constitutional: Well-developed, well-nourished female in no acute distress.  Cardiovascular: normal rate & rhythm, no murmur Breast: no masses. No skin changes. No nipple  drainage Respiratory: normal rate and effort. Lung sounds clear throughout GI: Abd soft, non-tender, Pos BS x 4. No guarding or rebound tenderness. No CVAT Neurologic: Alert and oriented x 4.  GU:     SPECULUM EXAM: NEFG, small amount of thin yellow discharge. Cervix pink/smooth  BIMANUAL: No CMT. uterus normal size, no  adnexal tenderness or masses.    LAB RESULTS Results for orders placed or performed during the hospital encounter of 08/16/18 (from the past 24 hour(s))  Urinalysis, Routine w reflex microscopic     Status: Abnormal   Collection Time: 08/16/18  9:21 AM  Result Value Ref Range   Color, Urine YELLOW YELLOW   APPearance HAZY (A) CLEAR   Specific Gravity, Urine 1.020 1.005 - 1.030   pH 5.0 5.0 - 8.0   Glucose, UA NEGATIVE NEGATIVE mg/dL   Hgb urine dipstick LARGE (A) NEGATIVE   Bilirubin Urine NEGATIVE NEGATIVE   Ketones, ur NEGATIVE NEGATIVE mg/dL   Protein, ur NEGATIVE NEGATIVE mg/dL   Nitrite NEGATIVE NEGATIVE   Leukocytes, UA NEGATIVE NEGATIVE   RBC / HPF 6-10 0 - 5 RBC/hpf   WBC, UA 0-5 0 - 5 WBC/hpf   Bacteria, UA NONE SEEN NONE SEEN   Squamous Epithelial / LPF 0-5 0 - 5   Mucus PRESENT   Pregnancy, urine POC     Status: None   Collection Time: 08/16/18  9:26 AM  Result Value Ref Range   Preg Test, Ur NEGATIVE NEGATIVE  Wet prep, genital     Status: Abnormal   Collection Time: 08/16/18  9:32 AM  Result Value Ref Range   Yeast Wet Prep HPF POC NONE SEEN NONE SEEN   Trich, Wet Prep NONE SEEN NONE SEEN   Clue Cells Wet Prep HPF POC PRESENT (A) NONE SEEN   WBC, Wet Prep HPF POC MANY (A) NONE SEEN   Sperm NONE SEEN     IMAGING No results found.  MAU COURSE Orders Placed This Encounter  Procedures  . Wet prep, genital  . Urinalysis, Routine w reflex microscopic  . Pregnancy, urine POC  . Discharge patient   Meds ordered this encounter  Medications  . clindamycin (CLEOCIN) 300 MG capsule    Sig: Take 1 capsule (300 mg total) by mouth 2 (two) times  daily.    Dispense:  14 capsule    Refill:  0    Order Specific Question:   Supervising Provider    Answer:   Jaynie Collins A [[id number]]    MDM VSS, NAD UPT negative. D/w patient that pregnancy is extremely unlikely/impossible s/p bilateral salpingectomy Benign abdominal exam. No CVAT. Pt has appropriate f/u with her PCP & GI.   ASSESSMENT 1. BV (bacterial vaginosis)   2. Pregnancy examination or test, negative result     PLAN Discharge home in stable condition. Rx clinda for recurrent BV Keep scheduled f/u with PCP & GI  Allergies as of 08/16/2018      Reactions   Hydrocodone Nausea Only   Dizziness      Medication List    TAKE these medications   acetaminophen 500 MG tablet Commonly known as:  TYLENOL Take 2 tablets (1,000 mg total) by mouth every 8 (eight) hours as needed for up to 7 days.   clindamycin 300 MG capsule Commonly known as:  CLEOCIN Take 1 capsule (300 mg total) by mouth 2 (two) times daily.   fluticasone 50 MCG/ACT nasal spray Commonly known as:  FLONASE Place 1 spray into both nostrils daily. What changed:    when to take this  reasons to take this   gabapentin 300 MG capsule Commonly known as:  NEURONTIN Take 1 capsule (300 mg total) by mouth 3 (three) times daily.   omeprazole 40 MG capsule Commonly known as:  PRILOSEC Take 1 capsule (40 mg total) by  mouth daily.        Judeth HornLawrence, Tequilla Cousineau, NP 08/16/2018  11:15 AM

## 2018-08-16 NOTE — Discharge Instructions (Signed)
Bacterial Vaginosis Bacterial vaginosis is a vaginal infection that occurs when the normal balance of bacteria in the vagina is disrupted. It results from an overgrowth of certain bacteria. This is the most common vaginal infection among women ages 15-44. Because bacterial vaginosis increases your risk for STIs (sexually transmitted infections), getting treated can help reduce your risk for chlamydia, gonorrhea, herpes, and HIV (human immunodeficiency virus). Treatment is also important for preventing complications in pregnant women, because this condition can cause an early (premature) delivery. What are the causes? This condition is caused by an increase in harmful bacteria that are normally present in small amounts in the vagina. However, the reason that the condition develops is not fully understood. What increases the risk? The following factors may make you more likely to develop this condition:  Having a new sexual partner or multiple sexual partners.  Having unprotected sex.  Douching.  Having an intrauterine device (IUD).  Smoking.  Drug and alcohol abuse.  Taking certain antibiotic medicines.  Being pregnant.  You cannot get bacterial vaginosis from toilet seats, bedding, swimming pools, or contact with objects around you. What are the signs or symptoms? Symptoms of this condition include:  Grey or white vaginal discharge. The discharge can also be watery or foamy.  A fish-like odor with discharge, especially after sexual intercourse or during menstruation.  Itching in and around the vagina.  Burning or pain with urination.  Some women with bacterial vaginosis have no signs or symptoms. How is this diagnosed? This condition is diagnosed based on:  Your medical history.  A physical exam of the vagina.  Testing a sample of vaginal fluid under a microscope to look for a large amount of bad bacteria or abnormal cells. Your health care provider may use a cotton swab  or a small wooden spatula to collect the sample.  How is this treated? This condition is treated with antibiotics. These may be given as a pill, a vaginal cream, or a medicine that is put into the vagina (suppository). If the condition comes back after treatment, a second round of antibiotics may be needed. Follow these instructions at home: Medicines  Take over-the-counter and prescription medicines only as told by your health care provider.  Take or use your antibiotic as told by your health care provider. Do not stop taking or using the antibiotic even if you start to feel better. General instructions  If you have a female sexual partner, tell her that you have a vaginal infection. She should see her health care provider and be treated if she has symptoms. If you have a female sexual partner, he does not need treatment.  During treatment: ? Avoid sexual activity until you finish treatment. ? Do not douche. ? Avoid alcohol as directed by your health care provider. ? Avoid breastfeeding as directed by your health care provider.  Drink enough water and fluids to keep your urine clear or pale yellow.  Keep the area around your vagina and rectum clean. ? Wash the area daily with warm water. ? Wipe yourself from front to back after using the toilet.  Keep all follow-up visits as told by your health care provider. This is important. How is this prevented?  Do not douche.  Wash the outside of your vagina with warm water only.  Use protection when having sex. This includes latex condoms and dental dams.  Limit how many sexual partners you have. To help prevent bacterial vaginosis, it is best to have sex with just   one partner (monogamous).  Make sure you and your sexual partner are tested for STIs.  Wear cotton or cotton-lined underwear.  Avoid wearing tight pants and pantyhose, especially during summer.  Limit the amount of alcohol that you drink.  Do not use any products that  contain nicotine or tobacco, such as cigarettes and e-cigarettes. If you need help quitting, ask your health care provider.  Do not use illegal drugs. Where to find more information:  Centers for Disease Control and Prevention: www.cdc.gov/std  American Sexual Health Association (ASHA): www.ashastd.org  U.S. Department of Health and Human Services, Office on Women's Health: www.womenshealth.gov/ or https://www.womenshealth.gov/a-z-topics/bacterial-vaginosis Contact a health care provider if:  Your symptoms do not improve, even after treatment.  You have more discharge or pain when urinating.  You have a fever.  You have pain in your abdomen.  You have pain during sex.  You have vaginal bleeding between periods. Summary  Bacterial vaginosis is a vaginal infection that occurs when the normal balance of bacteria in the vagina is disrupted.  Because bacterial vaginosis increases your risk for STIs (sexually transmitted infections), getting treated can help reduce your risk for chlamydia, gonorrhea, herpes, and HIV (human immunodeficiency virus). Treatment is also important for preventing complications in pregnant women, because the condition can cause an early (premature) delivery.  This condition is treated with antibiotic medicines. These may be given as a pill, a vaginal cream, or a medicine that is put into the vagina (suppository). This information is not intended to replace advice given to you by your health care provider. Make sure you discuss any questions you have with your health care provider. Document Released: 09/13/2005 Document Revised: 01/17/2017 Document Reviewed: 05/29/2016 Elsevier Interactive Patient Education  2018 [employer]  

## 2018-08-16 NOTE — MAU Note (Signed)
Bladder Scanner not charged, unable to do scan.  I&O cath done for specimen.

## 2018-08-16 NOTE — MAU Note (Signed)
Pt presents to MAU with complaints of sore breasts, lower back and abdominal pain since Saturday. Denies any VB or abnormal discharge

## 2018-08-17 ENCOUNTER — Encounter: Payer: Self-pay | Admitting: Gastroenterology

## 2018-08-17 ENCOUNTER — Ambulatory Visit: Payer: Self-pay | Admitting: Gastroenterology

## 2018-08-17 VITALS — BP 116/68 | HR 72 | Ht 60.0 in | Wt 198.0 lb

## 2018-08-17 DIAGNOSIS — R1084 Generalized abdominal pain: Secondary | ICD-10-CM

## 2018-08-17 DIAGNOSIS — Z791 Long term (current) use of non-steroidal anti-inflammatories (NSAID): Secondary | ICD-10-CM

## 2018-08-17 LAB — GC/CHLAMYDIA PROBE AMP (~~LOC~~) NOT AT ARMC
CHLAMYDIA, DNA PROBE: NEGATIVE
NEISSERIA GONORRHEA: NEGATIVE

## 2018-08-17 MED ORDER — OMEPRAZOLE 40 MG PO CPDR: 40.0000 mg | DELAYED_RELEASE_CAPSULE | Freq: Every day | ORAL | 3 refills | Status: DC

## 2018-08-17 MED ORDER — SUCRALFATE 1 G PO TABS: 1.0000 g | ORAL_TABLET | Freq: Four times a day (QID) | ORAL | 3 refills | Status: DC

## 2018-08-17 NOTE — Progress Notes (Addendum)
Referring Provider: No ref. provider found Primary Care Physician:  Clent Demark, PA-C   Reason for Consultation:  Abdominal pain   IMPRESSION:  Epigastric abdominal pain    - no source identified on ultrasound on renal protocol CT    - normal liver enzymes and lipase ESR 37, CRP 8 Frequent NSAIDs  Differential: NSAID-related PUD, esophagitis, gastritis, functional pain and less likely hepatobiliary sources.  PLAN: Records from [facility] (On [facility] Dr. 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 Altamease Oiler for further evaluation of abdominal pain.  The history is obtained through the patient and review of her electronic health record.    Multiple complaints started in July. Patient is most concerned about back and bilateral flank pain. But, also has abdominal pain. Attributed to drinking too much lemonade in July. Stopped all alcohol including hard core liquor to attempt to improve the pain earlier this year. Prior MJ use but not since January 2019.  H pylori by breath test was negative in March 2019. GI referral recommended but insurance/finances prevented her from coming at that time.  Started feeling better and was actually symptom free.   Flare of symptoms began a few weeks ago. Started after taking Flagyl for bacterial vaginoosis. Feels like it's putting more acid in there. Pain is now described as a  midepigastric, constant, non-radiating, stabbing. Associated mild nausea. Some heartburn. Pain worsens with alcohol and chocolate.  Broccoli and cabbage improve her symptoms. + eructation. No dysphagia or odynophagia. Good  appetite. No weight change. No change in bowel habits. No change in pain with defecation. Pain has been severe enough that she has been evaluated in the ED and an urgent care.   Stopped taking ibuprofen 800 mg two weeks ago. Previously prescribed for headache, arthralgias and sinus pain. Dr. Altamease Oiler started Nexium in addition to Carafate.   No prior evaluation by GI. No prior endoscopy.   Grandmother had similar problems and had PUD.   US abdomen 06/13/17 was unremarkable. CT renal stone from 11/27/17 revealed "Multiple bilateral nonobstructing renal calculi measuring up to 3 mm".  Labs from November show a normal CBC and CMP including liver enzymes, normal lipase, and Sed rate 37, CRP 8   Past Medical History:  Diagnosis Date  . Cannabis dependence in remission ([place])   . Elevated glucose   . Herpes 2002  . Infection   . Mixed hyperlipidemia   . Nicotine dependence   . Trichimoniasis 12-2010  . Urinary calculi     Past Surgical History:  Procedure Laterality Date  . DILATION AND CURETTAGE OF UTERUS    . LAPAROSCOPY  11/27/2011   Procedure: LAPAROSCOPY OPERATIVE;  Surgeon: Mora Bellman, MD;  Location: [facility];  Service: Gynecology;  Laterality: Bilateral;  . SALPINGECTOMY     Bilateral for twin ectopic preg.   . TUBAL LIGATION  12-05-2010    Current Outpatient Medications  Medication Sig Dispense Refill  . clindamycin (CLEOCIN) 300 MG capsule Take 1 capsule (300 mg total) by mouth 2 (two) times daily. 14 capsule 0  . fluticasone (FLONASE) 50 MCG/ACT nasal spray Place 1 spray into both  nostrils daily. (Patient taking differently: Place 1 spray into both nostrils daily as needed for allergies. ) 16 g 2  . gabapentin (NEURONTIN) 300 MG capsule Take 1 capsule (300 mg total) by mouth 3 (three) times daily. 90 capsule 3  . omeprazole (PRILOSEC) 40 MG capsule Take 1 capsule (40 mg total) by mouth daily. 90 capsule 3  . sucralfate (CARAFATE) 1 g tablet Take 1 tablet (1 g total) by mouth 4  (four) times daily. 120 tablet 3   No current facility-administered medications for this visit.     Allergies as of 08/17/2018 - Review Complete 08/17/2018  Allergen Reaction Noted  . Hydrocodone Nausea Only 06/13/2018    Family History  Problem Relation Age of Onset  . Hypertension Mother   . Diabetes Maternal Grandmother   . Heart disease Maternal Grandmother   . Hypertension Maternal Grandmother   . Diabetes Maternal Grandfather   . Heart disease Maternal Grandfather   . Hypertension Maternal Grandfather   . Hyperlipidemia Daughter   . Anesthesia problems Neg Hx     Social History   Socioeconomic History  . Marital status: Single    Spouse name: Not on file  . Number of children: Not on file  . Years of education: Not on file  . Highest education level: Not on file  Occupational History  . Not on file  Social Needs  . Financial resource strain: Not on file  . Food insecurity:    Worry: Not on file    Inability: Not on file  . Transportation needs:    Medical: Not on file    Non-medical: Not on file  Tobacco Use  . Smoking status: Never Smoker  . Smokeless tobacco: Never Used  Substance and Sexual Activity  . Alcohol use: Not Currently    Comment: social  . Drug use: No  . Sexual activity: Yes    Birth control/protection: Surgical  Lifestyle  . Physical activity:    Days per week: Not on file    Minutes per session: Not on file  . Stress: Not on file  Relationships  . Social connections:    Talks on phone: Not on file    Gets together: Not on file    Attends religious service: Not on file    Active member of club or organization: Not on file    Attends meetings of clubs or organizations: Not on file    Relationship status: Not on file  . Intimate partner violence:    Fear of current or ex partner: Not on file    Emotionally abused: Not on file    Physically abused: Not on file    Forced sexual activity: Not on file  Other Topics Concern  . Not on  file  Social History Narrative  . Not on file    Review of Systems: 12 system ROS is negative except as noted above with the additions of back pain, hematuria, headaches.  Filed Weights   08/17/18 0835  Weight: 198 lb (89.8 kg)    Physical Exam: Vital signs were reviewed. General:   Alert, well-nourished, pleasant and cooperative in NAD Head:  Normocephalic and atraumatic. Eyes:  Sclera clear, no icterus.   Conjunctiva pink. Mouth:  No deformity or lesions.   Neck:  Supple; no thyromegaly. Lungs:  Clear throughout to auscultation.   No wheezes. Heart:  Regular rate and rhythm; no murmurs Abdomen:  Soft, mild epigastric tenderness, normal bowel sounds. No rebound or guarding. No hepatosplenomegaly  Rectal:  Deferred  Msk:  Symmetrical without gross deformities. Extremities:  No gross deformities or edema. Neurologic:  Alert and  oriented x4;  grossly nonfocal Skin:  No rash or bruise. Psych:  Alert and cooperative. Normal mood and affect.   Aubriee Szeto L. Tarri Glenn Md, MPH [facility] 08/17/2018, 12:25 PM

## 2018-08-17 NOTE — Patient Instructions (Signed)
Avoid all NSAIDs  We have sent the following medications to your pharmacy for you to pick up at your convenience: Omeprazole 40 mg twice a day for 2 months  Carafate 1 gram twice a day, can use up to four times a day if needed before meals and at bedtime.

## 2018-09-06 ENCOUNTER — Encounter (INDEPENDENT_AMBULATORY_CARE_PROVIDER_SITE_OTHER): Payer: Self-pay | Admitting: Physician Assistant

## 2018-09-06 ENCOUNTER — Ambulatory Visit (INDEPENDENT_AMBULATORY_CARE_PROVIDER_SITE_OTHER): Payer: Self-pay | Admitting: Physician Assistant

## 2018-09-06 ENCOUNTER — Other Ambulatory Visit: Payer: Self-pay

## 2018-09-06 VITALS — BP 124/78 | HR 68 | Temp 97.9°F | Ht 60.0 in | Wt 196.0 lb

## 2018-09-06 DIAGNOSIS — K2970 Gastritis, unspecified, without bleeding: Secondary | ICD-10-CM

## 2018-09-06 DIAGNOSIS — R52 Pain, unspecified: Secondary | ICD-10-CM

## 2018-09-06 DIAGNOSIS — K2990 Gastroduodenitis, unspecified, without bleeding: Secondary | ICD-10-CM

## 2018-09-06 NOTE — Patient Instructions (Signed)
Joint Pain Joint pain can be caused by many things. The joint can be bruised, infected, weak from aging, or sore from exercise. The pain will probably go away if you follow your doctor's instructions for home care. If your joint pain continues, more tests may be needed to help find the cause of your condition. Follow these instructions at home: Watch your condition for any changes. Follow these instructions as told to lessen the pain that you are feeling:  Take medicines only as told by your doctor.  Rest the sore joint for as long as told by your doctor. If your doctor tells you to, raise (elevate) the painful joint above the level of your heart while you are sitting or lying down.  Do not do things that cause pain or make the pain worse.  If told, put ice on the painful area: ? Put ice in a plastic bag. ? Place a towel between your skin and the bag. ? Leave the ice on for 20 minutes, 2-3 times per day.  Wear an elastic bandage, splint, or sling as told by your doctor. Loosen the bandage or splint if your fingers or toes lose feeling (become numb) and tingle, or if they turn cold and blue.  Begin exercising or stretching the joint as told by your doctor. Ask your doctor what types of exercise are safe for you.  Keep all follow-up visits as told by your doctor. This is important.  Contact a doctor if:  Your pain gets worse and medicine does not help it.  Your joint pain does not get better in 3 days.  You have more bruising or swelling.  You have a fever.  You lose 10 pounds (4.5 kg) or more without trying. Get help right away if:  You are not able to move the joint.  Your fingers or toes become numb or they turn cold and blue. This information is not intended to replace advice given to you by your health care provider. Make sure you discuss any questions you have with your health care provider. Document Released: 09/01/2009 Document Revised: 02/19/2016 Document Reviewed:  06/25/2014 Elsevier Interactive Patient Education  2018 Elsevier Inc.  

## 2018-09-06 NOTE — Progress Notes (Signed)
Subjective:  Patient ID: Theresa Mooney, female    DOB: 1981-01-20  Age: 37 y.o. MRN: [id number]  CC: f/u   HPI Theresa Mooney is a 37 y.o. female with a medical history of HLD, HSV, urinary calculi, ectopic pregnancy, Nicotine dependence, and Cannabis dependence presents to f/u on abdominal pain. States she has been doing well with omeprazole and carafate. Also went to GI and was advised to increase omeprazole 40 mg to BID. She will have EGD if gastritis is not resolved.     In regards to generalized body aches and arthralgias, says she continues with the same mild pains. Attributed to repetitive motions at work. Pains felt in the pectoralis bilaterally, hands, knees, shoulders. Pains are relieved with rest. Work up thus far revealed mildly positive ESR. Negative ANA, CRP, BMP, HCG.   Outpatient Medications Prior to Visit  Medication Sig Dispense Refill  . gabapentin (NEURONTIN) 300 MG capsule Take 1 capsule (300 mg total) by mouth 3 (three) times daily. 90 capsule 3  . omeprazole (PRILOSEC) 40 MG capsule Take 1 capsule (40 mg total) by mouth daily. 90 capsule 3  . sucralfate (CARAFATE) 1 g tablet Take 1 tablet (1 g total) by mouth 4 (four) times daily. 120 tablet 3  . fluticasone (FLONASE) 50 MCG/ACT nasal spray Place 1 spray into both nostrils daily. (Patient taking differently: Place 1 spray into both nostrils daily as needed for allergies. ) 16 g 2  . clindamycin (CLEOCIN) 300 MG capsule Take 1 capsule (300 mg total) by mouth 2 (two) times daily. 14 capsule 0   No facility-administered medications prior to visit.      ROS Review of Systems  Constitutional: Negative for chills, fever and malaise/fatigue.  Eyes: Negative for blurred vision.  Respiratory: Negative for shortness of breath.   Cardiovascular: Negative for chest pain and palpitations.  Gastrointestinal: Negative for abdominal pain and nausea.  Genitourinary: Negative for dysuria and hematuria.  Musculoskeletal:  Positive for joint pain and myalgias.  Skin: Negative for rash.  Neurological: Negative for tingling and headaches.  Psychiatric/Behavioral: Negative for depression. The patient is not nervous/anxious.     Objective:  BP 124/78 (BP Location: Right Arm, Patient Position: Sitting, Cuff Size: Large)   Pulse 68   Temp 97.9 F (36.6 C) (Oral)   Ht 5' (1.524 m)   Wt 196 lb (88.9 kg)   LMP 08/27/2018 (Exact Date)   SpO2 100%   BMI 38.28 kg/m   BP/Weight 09/06/2018 08/17/2018 35/46/5681  Systolic BP 275 170 017  Diastolic BP 78 68 72  Wt. (Lbs) 196 198 201.94  BMI 38.28 38.67 38.79      Physical Exam  Constitutional: She is oriented to person, place, and time.  Well developed, obese, NAD, polite  HENT:  Head: Normocephalic and atraumatic.  Eyes: No scleral icterus.  Neck: Normal range of motion. Neck supple. No thyromegaly present.  Cardiovascular: Normal rate, regular rhythm and normal heart sounds.  Pulmonary/Chest: Effort normal and breath sounds normal.  Abdominal: Soft. Bowel sounds are normal. There is no tenderness.  Musculoskeletal: She exhibits no edema or deformity.  Joints with full aROM throughout.  Neurological: She is alert and oriented to person, place, and time.  Skin: Skin is warm and dry. No rash noted. No erythema. No pallor.  Psychiatric: She has a normal mood and affect. Her behavior is normal. Thought content normal.  Vitals reviewed.    Assessment & Plan:   1. Gastritis and gastroduodenitis - Continue  with GI recommendations. Continue Omeprazole and Carafate.   2. Generalized body aches - Work up thus far revealed mildly positive ESR. Negative ANA, CRP, BMP, HCG. - Rheumatoid factor - CYCLIC CITRUL PEPTIDE ANTIBODY, IGG/IGA   Follow-up: Return if symptoms worsen or fail to improve, for Will call with results.Clent Demark PA

## 2018-09-08 ENCOUNTER — Telehealth (INDEPENDENT_AMBULATORY_CARE_PROVIDER_SITE_OTHER): Payer: Self-pay

## 2018-09-08 LAB — CYCLIC CITRUL PEPTIDE ANTIBODY, IGG/IGA: CYCLIC CITRULLIN PEPTIDE AB: 7 U (ref 0–19)

## 2018-09-08 LAB — RHEUMATOID FACTOR

## 2018-09-08 NOTE — Telephone Encounter (Signed)
-----   Message from Loletta Specteroger David Gomez, PA-C sent at 09/08/2018  8:24 AM EST ----- Negative for rheumatoid arthritis.

## 2018-09-08 NOTE — Telephone Encounter (Signed)
Patient aware that she is negative for rheumatoid arthritis. Theresa Mooney S [name], CMA

## 2018-09-22 ENCOUNTER — Inpatient Hospital Stay (HOSPITAL_COMMUNITY)
Admission: AD | Admit: 2018-09-22 | Discharge: 2018-09-22 | Disposition: A | Discharge status: Home / Self Care | Payer: Self-pay | Source: Ambulatory Visit | Attending: Obstetrics & Gynecology | Admitting: Obstetrics & Gynecology

## 2018-09-22 ENCOUNTER — Encounter (HOSPITAL_COMMUNITY): Payer: Self-pay | Admitting: *Deleted

## 2018-09-22 DIAGNOSIS — N644 Mastodynia: Secondary | ICD-10-CM | POA: Insufficient documentation

## 2018-09-22 LAB — URINALYSIS, ROUTINE W REFLEX MICROSCOPIC
Bacteria, UA: NONE SEEN
Bilirubin Urine: NEGATIVE
Glucose, UA: NEGATIVE mg/dL
KETONES UR: NEGATIVE mg/dL
Leukocytes, UA: NEGATIVE
NITRITE: NEGATIVE
Protein, ur: 30 mg/dL — AB
Specific Gravity, Urine: 1.029 (ref 1.005–1.030)
pH: 5 (ref 5.0–8.0)

## 2018-09-22 LAB — POCT PREGNANCY, URINE: Preg Test, Ur: NEGATIVE

## 2018-09-22 NOTE — MAU Note (Signed)
Pt reports bilateral breast pain x 4 days , L>R.

## 2018-09-22 NOTE — MAU Provider Note (Signed)
History     CSN: [id number]  Arrival date and time: 09/22/18 1758   First Provider Initiated Contact with Patient 09/22/18 1845      Chief Complaint  Patient presents with  . Breast Pain   37 y.o. female here with bilateral breast tenderness x4 days. Denies lumps or masses. No color or temperature changes. Reports nipple discomfort prior to last menses. Menses is due next week. She is sexually active but hx of bilateral salpingectomy so no concern for pregnancy. Denies family hx of breast cancer.   Past Medical History:  Diagnosis Date  . 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 Elevated glucose   . Herpes 2002  . Infection   . Mixed hyperlipidemia   . Nicotine dependence   . Trichimoniasis 12-2010  . Urinary calculi     Past Surgical History:  Procedure Laterality Date  . DILATION AND CURETTAGE OF UTERUS    . LAPAROSCOPY  11/27/2011   Procedure: LAPAROSCOPY OPERATIVE;  Surgeon: Catalina AntiguaPeggy Constant, MD;  Location: [facility];  Service: Gynecology;  Laterality: Bilateral;  . SALPINGECTOMY     Bilateral for twin ectopic preg.   . TUBAL LIGATION  12-05-2010    Family History  Problem Relation Age of Onset  . Hypertension Mother   . Diabetes Maternal Grandmother   . Heart disease Maternal Grandmother   . Hypertension Maternal Grandmother   . Diabetes Maternal Grandfather   . Heart disease Maternal Grandfather   . Hypertension Maternal Grandfather   . Hyperlipidemia Daughter   . Anesthesia problems Neg Hx     Social History   Tobacco Use  . Smoking status: Never Smoker  . Smokeless tobacco: Never Used  Substance Use Topics  . Alcohol use: Not Currently    Comment: social  . Drug use: No    Allergies:  Allergies  Allergen Reactions  . Hydrocodone Nausea Only    Dizziness    No medications prior to admission.    Review of Systems  Constitutional: Negative.    Physical Exam   Blood pressure 140/75, pulse 89, temperature 99.1 F (37.3 C), temperature  source Oral, resp. rate 16, height 5' (1.524 m), weight 88.9 kg, last menstrual period 08/27/2018, SpO2 98 %.  Physical Exam  Constitutional: She is oriented to person, place, and time. She appears well-developed and well-nourished.  HENT:  Head: Normocephalic.  Cardiovascular: Normal rate.  Respiratory: Effort normal. No respiratory distress. Right breast exhibits no inverted nipple, no mass, no nipple discharge, no skin change and no tenderness. Left breast exhibits no inverted nipple, no mass, no nipple discharge, no skin change and no tenderness. Breasts are symmetrical.  Musculoskeletal: Normal range of motion.  Neurological: She is alert and oriented to person, place, and time.  Skin: Skin is warm and dry.  Psychiatric: She has a normal mood and affect.   Results for orders placed or performed during the hospital encounter of 09/22/18 (from the past 24 hour(s))  Urinalysis, Routine w reflex microscopic     Status: Abnormal   Collection Time: 09/22/18  6:20 PM  Result Value Ref Range   Color, Urine YELLOW YELLOW   APPearance HAZY (A) CLEAR   Specific Gravity, Urine 1.029 1.005 - 1.030   pH 5.0 5.0 - 8.0   Glucose, UA NEGATIVE NEGATIVE mg/dL   Hgb urine dipstick LARGE (A) NEGATIVE   Bilirubin Urine NEGATIVE NEGATIVE   Ketones, ur NEGATIVE NEGATIVE mg/dL   Protein, ur 30 (A) NEGATIVE mg/dL   Nitrite NEGATIVE  NEGATIVE   Leukocytes, UA NEGATIVE NEGATIVE   RBC / HPF 6-10 0 - 5 RBC/hpf   WBC, UA 0-5 0 - 5 WBC/hpf   Bacteria, UA NONE SEEN NONE SEEN   Squamous Epithelial / LPF 6-10 0 - 5   Mucus PRESENT   Pregnancy, urine POC     Status: None   Collection Time: 09/22/18  6:36 PM  Result Value Ref Range   Preg Test, Ur NEGATIVE NEGATIVE   MAU Course  Procedures  MDM No evidence of mass. Tenderness likely d/t PMS. Recommend daily Vit E 1 week prior to menses. Stable for discharge home.   Assessment and Plan   1. Breast tenderness    Discharge home Follow up with PCP  prn  Allergies as of 09/22/2018      Reactions   Hydrocodone Nausea Only   Dizziness      Medication List    TAKE these medications   fluticasone 50 MCG/ACT nasal spray Commonly known as:  FLONASE Place 1 spray into both nostrils daily. What changed:    when to take this  reasons to take this   gabapentin 300 MG capsule Commonly known as:  NEURONTIN Take 1 capsule (300 mg total) by mouth 3 (three) times daily.   omeprazole 40 MG capsule Commonly known as:  PRILOSEC Take 1 capsule (40 mg total) by mouth daily.   sucralfate 1 g tablet Commonly known as:  CARAFATE Take 1 tablet (1 g total) by mouth 4 (four) times daily.      Donette LarryMelanie Carlene Bickley, CNM 09/22/2018, 7:54 PM

## 2018-09-22 NOTE — Discharge Instructions (Signed)
Premenstrual Syndrome  Premenstrual syndrome (PMS) is a group of physical, emotional, and behavioral symptoms that affect women of childbearing age. PMS starts 1-2 weeks before the start of a woman's period and goes away a few days after the period starts. It often recurs in a predictable pattern.  PMS can range from mild to severe. When it is severe, it is called premenstrual dysphoric disorder (PMDD). PMS can interfere in many ways with normal daily activities.  What are the causes?  The cause of this condition is not known, but it seems to be related to hormone changes that happen before menstruation.  What are the signs or symptoms?  Symptoms of this condition often happen every month. They go away completely after your period starts. Physical symptoms include:  · Bloating.  · Breast pain.  · Headaches.  · Extreme fatigue.  · Backaches.  · Swelling of the hands and feet.  · Weight gain.  · Hot flashes.  Emotional and behavioral symptoms include:  · Mood swings.  · Depression.  · Angry outbursts.  · Irritability.  · Anxiety.  · Crying spells.  · Food cravings or appetite changes.  · Changes in sexual desire.  · Confusion.  · Aggression.  · Social withdrawal.  · Poor concentration.  How is this diagnosed?  This condition is diagnosed if symptoms of PMS:  · Are present in the 5 days before your period starts.  · End within 4 days after your period starts.  · Happen at least 3 months in a row.  · Interfere with some of your normal activities.  Other conditions that can cause some of these symptoms must be ruled out before PMS can be diagnosed.  How is this treated?  This condition may be treated by:  · Maintaining a healthy lifestyle. This includes eating a balanced diet and exercising regularly.  · Taking medicines. Medicines can help relieve symptoms such as cramps, aches, pains, headaches, and breast tenderness. Depending on the severity of the condition, your health care provider may  recommend:  ? Over-the-counter pain medicines.  ? Prescription medicines for PMDD.  Follow these instructions at home:  Eating and drinking    · Eat a well-balanced diet.  · Avoid caffeine and alcohol.  · Limit the amount of salt and salty foods you eat. This will help lessen bloating.  · Drink enough fluid to keep your urine clear or pale yellow.  · Take a multivitamin if told to by your health care provider.  Lifestyle  · Do not use any tobacco products, such as cigarettes, chewing tobacco, and e-cigarettes. If you need help quitting, ask your health care provider.  · Exercise regularly as suggested by your health care provider.  · Get enough sleep.  · Practice relaxation techniques.  · Limit stress.  Other Instructions  · For 2-3 months, write down your symptoms, their severity, and how long they last. This will help your health care provider choose the best treatment for you.  · Take over-the-counter and prescription medicines only as told by your health care provider.  · If you are using oral contraceptive pills, use them as told by your health care provider.  This information is not intended to replace advice given to you by your health care provider. Make sure you discuss any questions you have with your health care provider.  Document Released: 09/10/2000 Document Revised: 10/15/2015 Document Reviewed: 06/13/2015  Elsevier Interactive Patient Education © 2019 Elsevier Inc.

## 2018-09-25 ENCOUNTER — Ambulatory Visit (HOSPITAL_COMMUNITY)
Admission: EM | Admit: 2018-09-25 | Discharge: 2018-09-25 | Disposition: A | Discharge status: Home / Self Care | Payer: Self-pay | Attending: Nurse Practitioner | Admitting: Nurse Practitioner

## 2018-09-25 ENCOUNTER — Encounter (HOSPITAL_COMMUNITY): Payer: Self-pay

## 2018-09-25 DIAGNOSIS — K219 Gastro-esophageal reflux disease without esophagitis: Secondary | ICD-10-CM | POA: Insufficient documentation

## 2018-09-25 MED ORDER — ALUM & MAG HYDROXIDE-SIMETH 200-200-20 MG/5ML PO SUSP
30.0000 mL | Freq: Once | ORAL | Status: AC
  Administered 2018-09-25: 30 mL via ORAL

## 2018-09-25 MED ORDER — HYOSCYAMINE SULFATE 0.125 MG SL SUBL: 0.1250 mg | SUBLINGUAL_TABLET | SUBLINGUAL | 0 refills | Status: DC | PRN

## 2018-09-25 MED ORDER — LIDOCAINE VISCOUS HCL 2 % MT SOLN
OROMUCOSAL | Status: AC
  Filled 2018-09-25: qty 15

## 2018-09-25 MED ORDER — LIDOCAINE VISCOUS HCL 2 % MT SOLN
15.0000 mL | Freq: Once | OROMUCOSAL | Status: AC
  Administered 2018-09-25: 15 mL via ORAL

## 2018-09-25 MED ORDER — ALUM & MAG HYDROXIDE-SIMETH 200-200-20 MG/5ML PO SUSP
ORAL | Status: AC
  Filled 2018-09-25: qty 30

## 2018-09-25 MED ORDER — HYOSCYAMINE SULFATE 0.125 MG SL SUBL: 0.2500 mg | SUBLINGUAL_TABLET | Freq: Once | SUBLINGUAL | Status: DC

## 2018-09-25 NOTE — Discharge Instructions (Addendum)
Take Omeprazole and Carafate as prescribed by Dr. Orvan FalconerBeavers consistently. You can also take the Hyoscyamine that I have prescribed for you today as needed. Drink plenty of fluids. Try eating smaller meals. Elevate head of bed at night. Avoid caffeine, chocolate, nicotine and peppermint. Avoiding tight fitting clothing. Follow-up with Dr. Orvan FalconerBeavers in 2 weeks. Call as soon as possible to schedule this appointment.

## 2018-09-25 NOTE — ED Triage Notes (Signed)
Pt present breast/chest pain that started about 1 wk ago. Pt states it hurts when she takes a deep breath.

## 2018-09-25 NOTE — ED Provider Notes (Signed)
[facility]    CSN: [id number] Arrival date & time: 09/25/18  1635     History   Chief Complaint Chief Complaint  Patient presents with  . Chest Pain  . Breast Problem    HPI Theresa Mooney is a 37 y.o. female.   Subjective:   Theresa Mooney is an 37 y.o. female who presents for evaluation of heartburn. This has been associated with belching, chest pain, midespigastric pain and upper abdominal discomfort. She denies abdominal bloating, choking on food, cough, difficulty swallowing, dysphagia, hematemesis, melena, nausea, odynophagia and symptoms primarily relate to meals, and lying down after meals. Symptoms have been present for 1 week. She denies dysphagia. She has not lost weight. She denies melena, hematochezia, hematemesis and coffee ground emesis. Medical therapy in the past has included: antacids which has provided some relief.   Notably, the patient has had several visits in the past with similar complaints. She was referred to GI and followed-up with them back on 08/17/18. She was prescribed Omeprazole 40 mg BID x 8 weeks and  Carafate 1g BID-QID. She was also told to avoid all NSAIDs as well as adhere to GERD lifestyle modifications. She is to have an EGD in the future if these supportive measures were ineffective. Patient admits to not taking the prescribe therapies consistently.   The following portions of the patient's history were reviewed and updated as appropriate: allergies, current medications, past family history, past medical history, past social history, past surgical history and problem list.        Past Medical History:  Diagnosis Date  . 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 Elevated glucose   . Herpes 2002  . Infection   . Mixed hyperlipidemia   . Nicotine dependence   . Trichimoniasis 12-2010  . Urinary calculi     Patient Active Problem List   Diagnosis Date Noted  . h/o postpartum BTL  12/13/2011  . S/P ectopic pregnancy  12/13/2011    Past Surgical History:  Procedure Laterality Date  . DILATION AND CURETTAGE OF UTERUS    . LAPAROSCOPY  11/27/2011   Procedure: LAPAROSCOPY OPERATIVE;  Surgeon: Catalina AntiguaPeggy Constant, MD;  Location: [facility];  Service: Gynecology;  Laterality: Bilateral;  . SALPINGECTOMY     Bilateral for twin ectopic preg.   . TUBAL LIGATION  12-05-2010    OB History    Gravida  7   Para  5   Term      Preterm      AB  2   Living  5     SAB      TAB  1   Ectopic  1   Multiple      Live Births  1            Home Medications    Prior to Admission medications   Medication Sig Start Date End Date Taking? Authorizing Provider  fluticasone (FLONASE) 50 MCG/ACT nasal spray Place 1 spray into both nostrils daily. Patient taking differently: Place 1 spray into both nostrils daily as needed for allergies.  07/31/18   Georgetta HaberBurky, Natalie B, NP  gabapentin (NEURONTIN) 300 MG capsule Take 1 capsule (300 mg total) by mouth 3 (three) times daily. 08/09/18   Loletta SpecterGomez, Roger David, PA-C  hyoscyamine (LEVSIN SL) 0.125 MG SL tablet Place 1 tablet (0.125 mg total) under the tongue every 4 (four) hours as needed (heartburn). 09/25/18   Lurline IdolMurrill, Opha Mcghee, FNP  omeprazole (PRILOSEC) 40 MG capsule Take  1 capsule (40 mg total) by mouth daily. 08/17/18   Tressia Danas, MD  sucralfate (CARAFATE) 1 g tablet Take 1 tablet (1 g total) by mouth 4 (four) times daily. 08/17/18   Tressia Danas, MD    Family History Family History  Problem Relation Age of Onset  . Hypertension Mother   . Diabetes Maternal Grandmother   . Heart disease Maternal Grandmother   . Hypertension Maternal Grandmother   . Diabetes Maternal Grandfather   . Heart disease Maternal Grandfather   . Hypertension Maternal Grandfather   . Hyperlipidemia Daughter   . Anesthesia problems Neg Hx     Social History Social History   Tobacco Use  . Smoking status: Never Smoker  . Smokeless tobacco: Never Used  Substance Use  Topics  . Alcohol use: Not Currently    Comment: social  . Drug use: No     Allergies   Hydrocodone   Review of Systems Review of Systems  Constitutional: Negative for fever.  HENT: Negative.   Eyes: Negative.   Respiratory: Positive for chest tightness. Negative for shortness of breath.   Gastrointestinal: Negative for abdominal distention, abdominal pain, blood in stool, constipation, diarrhea, nausea and vomiting.  Genitourinary: Negative.   Neurological: Positive for dizziness.  All other systems reviewed and are negative.    Physical Exam Triage Vital Signs ED Triage Vitals  Enc Vitals Group     BP 09/25/18 1658 121/88     Pulse Rate 09/25/18 1658 70     Resp 09/25/18 1658 18     Temp 09/25/18 1658 97.7 F (36.5 C)     Temp Source 09/25/18 1658 Oral     SpO2 09/25/18 1658 99 %     Weight --      Height --      Head Circumference --      Peak Flow --      Pain Score 09/25/18 1700 7     Pain Loc --      Pain Edu? --      Excl. in GC? --    No data found.  Updated Vital Signs BP 121/88 (BP Location: Right Arm)   Pulse 70   Temp 97.7 F (36.5 C) (Oral)   Resp 18   LMP 08/27/2018 (Exact Date)   SpO2 99%   Visual Acuity Right Eye Distance:   Left Eye Distance:   Bilateral Distance:    Right Eye Near:   Left Eye Near:    Bilateral Near:     Physical Exam Constitutional:      General: She is not in acute distress.    Appearance: She is well-developed. She is not ill-appearing or toxic-appearing.  HENT:     Head: Normocephalic.  Neck:     Musculoskeletal: Normal range of motion.  Cardiovascular:     Rate and Rhythm: Normal rate and regular rhythm.  Pulmonary:     Effort: Pulmonary effort is normal.  Abdominal:     General: Bowel sounds are normal.     Palpations: Abdomen is soft.     Tenderness: There is no abdominal tenderness.  Musculoskeletal: Normal range of motion.  Lymphadenopathy:     Cervical: No cervical adenopathy.  Skin:     General: Skin is warm and dry.  Neurological:     General: No focal deficit present.     Mental Status: She is alert and oriented to person, place, and time.  Psychiatric:        Mood  and Affect: Mood normal.        Behavior: Behavior normal.      UC Treatments / Results  Labs (all labs ordered are listed, but only abnormal results are displayed) Labs Reviewed - No data to display  EKG None  Radiology No results found.  Procedures Procedures (including critical care time)  Medications Ordered in UC Medications  alum & mag hydroxide-simeth (MAALOX/MYLANTA) 200-200-20 MG/5ML suspension 30 mL (30 mLs Oral Given 09/25/18 1747)  alum & mag hydroxide-simeth (MAALOX/MYLANTA) 200-200-20 MG/5ML suspension 30 mL (30 mLs Oral Given 09/25/18 1747)    And  lidocaine (XYLOCAINE) 2 % viscous mouth solution 15 mL (15 mLs Oral Given 09/25/18 1748)    Initial Impression / Assessment and Plan / UC Course  I have reviewed the triage vital signs and the nursing notes.  Pertinent labs & imaging results that were available during my care of the patient were reviewed by me and considered in my medical decision making (see chart for details).     37 year old female presenting with frequent belching, chest pain, epigastric discomfort and upper abdominal discomfort. She was evaluated by GI for similar complaints back on 08/17/18. She was prescribed Omeprazole 40 mg BID x 8 weeks and Carafate 1g BID-QID. She was also told to avoid all NSAIDs as well as adhere to GERD lifestyle modifications.  Patient admits to not consistently following these prescribed therapies.  Is alert and oriented x3.  Afebrile.  Vital signs stable.  Physical exam unremarkable.  Patient was given a GI cocktail in the clinic with some relief in her symptoms.  Advised patient to follow prescribed therapies from a GI and to be consistent with this. Will also add Hyoscyamine PRN. Nonpharmacologic treatments were discussed including:  eating smaller meals, elevation of the head of bed at night, avoidance of caffeine, chocolate, nicotine and peppermint, and avoiding tight fitting clothing.  Follow-up with Dr. Orvan Falconer in in 2 weeks (which is about 8 weeks from her initial appointment with GI as she was advised to follow-up with them in 8 weeks.)  Today's evaluation has revealed no signs of a dangerous process. Discussed diagnosis with patient. Patient aware of their diagnosis, possible red flag symptoms to watch out for and need for close follow up. Patient understands verbal and written discharge instructions. Patient comfortable with plan and disposition.  Patient has a clear mental status at this time, good insight into illness (after discussion and teaching) and has clear judgment to make decisions regarding their care.  Documentation was completed with the aid of voice recognition software. Transcription may contain typographical errors. Final Clinical Impressions(s) / UC Diagnoses   Final diagnoses:  Gastroesophageal reflux disease, esophagitis presence not specified     Discharge Instructions     Take Omeprazole and Carafate as prescribed by Dr. Orvan Falconer consistently. You can also take the Hyoscyamine that I have prescribed for you today as needed. Drink plenty of fluids. Try eating smaller meals. Elevate head of bed at night. Avoid caffeine, chocolate, nicotine and peppermint. Avoiding tight fitting clothing. Follow-up with Dr. Orvan Falconer in 2 weeks. Call as soon as possible to schedule this appointment.      ED Prescriptions    Medication Sig Dispense Auth. Provider   hyoscyamine (LEVSIN SL) 0.125 MG SL tablet Place 1 tablet (0.125 mg total) under the tongue every 4 (four) hours as needed (heartburn). 30 tablet Lurline Idol, FNP     Controlled Substance Prescriptions [place] Controlled Substance Registry consulted? Not Applicable   Cathlean Sauer,  Lelon MastSamantha, FNP 09/25/18 [phone]

## 2018-09-26 ENCOUNTER — Encounter (HOSPITAL_COMMUNITY): Payer: Self-pay

## 2018-09-26 ENCOUNTER — Emergency Department (HOSPITAL_COMMUNITY): Payer: Self-pay

## 2018-09-26 ENCOUNTER — Emergency Department (HOSPITAL_COMMUNITY)
Admission: EM | Admit: 2018-09-26 | Discharge: 2018-09-26 | Disposition: A | Discharge status: Home / Self Care | Payer: Self-pay | Attending: Emergency Medicine | Admitting: Emergency Medicine

## 2018-09-26 ENCOUNTER — Telehealth: Payer: Self-pay | Admitting: Gastroenterology

## 2018-09-26 DIAGNOSIS — K219 Gastro-esophageal reflux disease without esophagitis: Secondary | ICD-10-CM | POA: Insufficient documentation

## 2018-09-26 DIAGNOSIS — R1013 Epigastric pain: Secondary | ICD-10-CM

## 2018-09-26 DIAGNOSIS — R072 Precordial pain: Secondary | ICD-10-CM | POA: Insufficient documentation

## 2018-09-26 DIAGNOSIS — Z79899 Other long term (current) drug therapy: Secondary | ICD-10-CM | POA: Insufficient documentation

## 2018-09-26 LAB — URINALYSIS, ROUTINE W REFLEX MICROSCOPIC
BILIRUBIN URINE: NEGATIVE
GLUCOSE, UA: NEGATIVE mg/dL
KETONES UR: NEGATIVE mg/dL
LEUKOCYTES UA: NEGATIVE
Nitrite: NEGATIVE
PH: 6 (ref 5.0–8.0)
Protein, ur: NEGATIVE mg/dL
RBC / HPF: >50 RBC/hpf — ABNORMAL HIGH (ref 0–5)
Specific Gravity, Urine: 1.017 (ref 1.005–1.030)

## 2018-09-26 LAB — I-STAT BETA HCG BLOOD, ED (MC, WL, AP ONLY): I-stat hCG, quantitative: <5 m[IU]/mL (ref <5)

## 2018-09-26 LAB — COMPREHENSIVE METABOLIC PANEL
ALT: 20 U/L (ref 0–44)
AST: 31 U/L (ref 15–41)
Albumin: 4.2 g/dL (ref 3.5–5.0)
Alkaline Phosphatase: 84 U/L (ref 38–126)
Anion gap: 10 (ref 5–15)
BUN: 12 mg/dL (ref 6–20)
CO2: 23 mmol/L (ref 22–32)
Calcium: 8.9 mg/dL (ref 8.9–10.3)
Chloride: 104 mmol/L (ref 98–111)
Creatinine, Ser: 0.97 mg/dL (ref 0.44–1.00)
Glucose, Bld: 91 mg/dL (ref 70–99)
POTASSIUM: 5.4 mmol/L — AB (ref 3.5–5.1)
SODIUM: 137 mmol/L (ref 135–145)
Total Bilirubin: 1.5 mg/dL — ABNORMAL HIGH (ref 0.3–1.2)
Total Protein: 7.9 g/dL (ref 6.5–8.1)

## 2018-09-26 LAB — CBC WITH DIFFERENTIAL/PLATELET
ABS IMMATURE GRANULOCYTES: 0.02 10*3/uL (ref 0.00–0.07)
BASOS PCT: 0 %
Basophils Absolute: 0 10*3/uL (ref 0.0–0.1)
EOS ABS: 0 10*3/uL (ref 0.0–0.5)
Eosinophils Relative: 1 %
HCT: 44.5 % (ref 36.0–46.0)
Hemoglobin: 13 g/dL (ref 12.0–15.0)
IMMATURE GRANULOCYTES: 0 %
Lymphocytes Relative: 45 %
Lymphs Abs: 2.1 10*3/uL (ref 0.7–4.0)
MCH: 27.4 pg (ref 26.0–34.0)
MCHC: 29.2 g/dL — ABNORMAL LOW (ref 30.0–36.0)
MCV: 93.7 fL (ref 80.0–100.0)
MONOS PCT: 6 %
Monocytes Absolute: 0.3 10*3/uL (ref 0.1–1.0)
NEUTROS ABS: 2.2 10*3/uL (ref 1.7–7.7)
NEUTROS PCT: 48 %
PLATELETS: 265 10*3/uL (ref 150–400)
RBC: 4.75 MIL/uL (ref 3.87–5.11)
RDW: 13.9 % (ref 11.5–15.5)
WBC: 4.7 10*3/uL (ref 4.0–10.5)
nRBC: 0 % (ref 0.0–0.2)

## 2018-09-26 LAB — I-STAT TROPONIN, ED: Troponin i, poc: 0 ng/mL (ref 0.00–0.08)

## 2018-09-26 LAB — LIPASE, BLOOD: LIPASE: 37 U/L (ref 11–51)

## 2018-09-26 MED ORDER — ALUM & MAG HYDROXIDE-SIMETH 200-200-20 MG/5ML PO SUSP
30.0000 mL | Freq: Once | ORAL | Status: AC
  Administered 2018-09-26: 30 mL via ORAL
  Filled 2018-09-26: qty 30

## 2018-09-26 MED ORDER — LIDOCAINE VISCOUS HCL 2 % MT SOLN
15.0000 mL | Freq: Once | OROMUCOSAL | Status: AC
  Administered 2018-09-26: 15 mL via ORAL
  Filled 2018-09-26: qty 15

## 2018-09-26 MED ORDER — FAMOTIDINE 20 MG PO TABS
20.0000 mg | ORAL_TABLET | Freq: Once | ORAL | Status: AC
  Administered 2018-09-26: 20 mg via ORAL
  Filled 2018-09-26: qty 1

## 2018-09-26 NOTE — Telephone Encounter (Signed)
Please have her schedule an office visit.  Dr. Orvan FalconerBeavers wanted a follow up in 8 weeks.

## 2018-09-26 NOTE — ED Provider Notes (Signed)
[facility]EMERGENCY DEPT Provider Note   CSN: [id number] Arrival date & time: 09/26/18  1338     History   Chief Complaint Chief Complaint  Patient presents with  . Gastroesophageal Reflux    HPI Theresa Mooney is a 37 y.o. female with a PMHx of HLD and GERD, who presents to the ED with complaints of 1wk of chest pain, epigastric pain, and belching.  Patient states that she has been having generalized chest soreness for the last week, intermittently, describes it as 8/10 intermittent soreness across the center and anterior part of her chest, nonradiating, worse with eating, breathing, and movement, and somewhat improved with Tums.  No other treatments tried prior to arrival.  She reports that she has belching but it does not relieve the pain entirely, but she has been belching a lot.  She also reports having some mild epigastric discomfort as well.  Of note, pt was seen yesterday at [facility] for similar complaints, given GI cocktail which helped, and she was discharged home with hyoscyamine 0.125mg  SL tablets to take q4h PRN. She has been seen in the past there for similar complaints, advised to f/up with GI, which she did on 08/17/18 and they recommended omeprazole 40mg  BID x8wks + Carafate 1g BID-QID; she admits she hasn't been compliant with these medications.    She denies any fevers, chills, lightheadedness, diaphoresis, cough, shortness of breath, nausea, vomiting, diarrhea, constipation, dysuria, hematuria, myalgias, arthralgias, numbness, tingling, focal weakness, leg swelling, recent travel/surgery/immobilization, estrogen use, personal or family history of DVT/PE, or any other complaints at this time.  She is a non-smoker.  She thinks her uncle may have had an MI but she's not sure, no other known FHx of cardiac disease.  Of note, she is currently on her menstrual cycle.   The history is provided by the patient and medical records. No language interpreter was used.    Gastroesophageal Reflux  Associated symptoms include chest pain and abdominal pain. Pertinent negatives include no shortness of breath.    Past Medical History:  Diagnosis Date  . 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 Elevated glucose   . Herpes 2002  . Infection   . Mixed hyperlipidemia   . Nicotine dependence   . Trichimoniasis 12-2010  . Urinary calculi     Patient Active Problem List   Diagnosis Date Noted  . h/o postpartum BTL  12/13/2011  . S/P ectopic pregnancy 12/13/2011    Past Surgical History:  Procedure Laterality Date  . DILATION AND CURETTAGE OF UTERUS    . LAPAROSCOPY  11/27/2011   Procedure: LAPAROSCOPY OPERATIVE;  Surgeon: Catalina Antigua, MD;  Location: [facility];  Service: Gynecology;  Laterality: Bilateral;  . SALPINGECTOMY     Bilateral for twin ectopic preg.   . TUBAL LIGATION  12-05-2010     OB History    Gravida  7   Para  5   Term      Preterm      AB  2   Living  5     SAB      TAB  1   Ectopic  1   Multiple      Live Births  1            Home Medications    Prior to Admission medications   Medication Sig Start Date End Date Taking? Authorizing Provider  fluticasone (FLONASE) 50 MCG/ACT nasal spray Place 1 spray into both nostrils daily. Patient taking  differently: Place 1 spray into both nostrils daily as needed for allergies.  07/31/18   Georgetta HaberBurky, Natalie B, NP  gabapentin (NEURONTIN) 300 MG capsule Take 1 capsule (300 mg total) by mouth 3 (three) times daily. 08/09/18   Loletta SpecterGomez, Roger David, PA-C  hyoscyamine (LEVSIN SL) 0.125 MG SL tablet Place 1 tablet (0.125 mg total) under the tongue every 4 (four) hours as needed (heartburn). 09/25/18   Lurline IdolMurrill, Samantha, FNP  omeprazole (PRILOSEC) 40 MG capsule Take 1 capsule (40 mg total) by mouth daily. 08/17/18   Tressia DanasBeavers, Kimberly, MD  sucralfate (CARAFATE) 1 g tablet Take 1 tablet (1 g total) by mouth 4 (four) times daily. 08/17/18   Tressia DanasBeavers, Kimberly, MD    Family  History Family History  Problem Relation Age of Onset  . Hypertension Mother   . Diabetes Maternal Grandmother   . Heart disease Maternal Grandmother   . Hypertension Maternal Grandmother   . Diabetes Maternal Grandfather   . Heart disease Maternal Grandfather   . Hypertension Maternal Grandfather   . Hyperlipidemia Daughter   . Anesthesia problems Neg Hx     Social History Social History   Tobacco Use  . Smoking status: Never Smoker  . Smokeless tobacco: Never Used  Substance Use Topics  . Alcohol use: Not Currently    Comment: social  . Drug use: No     Allergies   Hydrocodone   Review of Systems Review of Systems  Constitutional: Negative for chills, diaphoresis and fever.  Respiratory: Negative for cough and shortness of breath.   Cardiovascular: Positive for chest pain. Negative for leg swelling.  Gastrointestinal: Positive for abdominal pain. Negative for constipation, diarrhea, nausea and vomiting.  Genitourinary: Negative for dysuria and hematuria.  Musculoskeletal: Negative for arthralgias and myalgias.  Skin: Negative for color change.  Allergic/Immunologic: Negative for immunocompromised state.  Neurological: Negative for weakness, light-headedness and numbness.  Psychiatric/Behavioral: Negative for confusion.   All other systems reviewed and are negative for acute change except as noted in the HPI.    Physical Exam Updated Vital Signs BP 127/71 (BP Location: Left Arm)   Pulse 79   Temp 99 F (37.2 C) (Oral)   Resp 18   Ht 5' (1.524 m)   Wt 88.9 kg   LMP 08/27/2018 (Exact Date)   SpO2 100%   BMI 38.28 kg/m   Physical Exam Vitals signs and nursing note reviewed.  Constitutional:      General: She is not in acute distress.    Appearance: Normal appearance. She is well-developed. She is not toxic-appearing.     Comments: Afebrile, nontoxic, NAD  HENT:     Head: Normocephalic and atraumatic.  Eyes:     General:        Right eye: No  discharge.        Left eye: No discharge.     Conjunctiva/sclera: Conjunctivae normal.  Neck:     Musculoskeletal: Normal range of motion and neck supple.  Cardiovascular:     Rate and Rhythm: Normal rate and regular rhythm.     Pulses: Normal pulses.     Heart sounds: Normal heart sounds, S1 normal and S2 normal. No murmur. No friction rub. No gallop.      Comments: RRR, nl s1/s2, no m/r/g, distal pulses intact, no pedal edema  Pulmonary:     Effort: Pulmonary effort is normal. No respiratory distress.     Breath sounds: Normal breath sounds. No decreased breath sounds, wheezing, rhonchi or rales.  Comments: CTAB in all lung fields, no w/r/r, no hypoxia or increased WOB, speaking in full sentences, SpO2 100% on RA  Chest:     Chest wall: Tenderness present. No deformity or crepitus.       Comments: Chest wall with mild TTP across entire anterior chest, without crepitus, deformities, or retractions  Abdominal:     General: Bowel sounds are normal. There is no distension.     Palpations: Abdomen is soft. Abdomen is not rigid.     Tenderness: There is abdominal tenderness in the epigastric area. There is no right CVA tenderness, left CVA tenderness, guarding or rebound. Negative signs include Murphy's sign and McBurney's sign.       Comments: Soft, nondistended, +BS throughout, with mild epigastric TTP, no r/g/r, neg murphy's, neg mcburney's, no CVA TTP   Musculoskeletal: Normal range of motion.     Comments: MAE x4 Strength and sensation grossly intact in all extremities Distal pulses intact Gait steady No pedal edema, neg homan's bilaterally   Skin:    General: Skin is warm and dry.     Findings: No rash.  Neurological:     Mental Status: She is alert and oriented to person, place, and time.     Sensory: Sensation is intact. No sensory deficit.     Motor: Motor function is intact.  Psychiatric:        Mood and Affect: Mood and affect normal.        Behavior: Behavior  normal.      ED Treatments / Results  Labs (all labs ordered are listed, but only abnormal results are displayed) Labs Reviewed  CBC WITH DIFFERENTIAL/PLATELET - Abnormal; Notable for the following components:      Result Value   MCHC 29.2 (*)    All other components within normal limits  COMPREHENSIVE METABOLIC PANEL - Abnormal; Notable for the following components:   Potassium 5.4 (*)    Total Bilirubin 1.5 (*)    All other components within normal limits  URINALYSIS, ROUTINE W REFLEX MICROSCOPIC - Abnormal; Notable for the following components:   Hgb urine dipstick LARGE (*)    RBC / HPF >50 (*)    Bacteria, UA RARE (*)    All other components within normal limits  LIPASE, BLOOD  I-STAT BETA HCG BLOOD, ED ([facility], [facility], [facility] ONLY)  I-STAT TROPONIN, ED    EKG EKG Interpretation  Date/Time:  Tuesday September 26 2018 15:05:15 EST Ventricular Rate:  71 PR Interval:    QRS Duration: 109 QT Interval:  399 QTC Calculation: 434 R Axis:   14 Text Interpretation:  Sinus rhythm Low voltage, precordial leads Borderline T abnormalities, anterior leads No significant change since last tracing Confirmed by Marily Memos [phone]) on 09/26/2018 4:11:33 PM   Radiology Dg Chest 2 View  Result Date: 09/26/2018 CLINICAL DATA:  Chest pain EXAM: CHEST - 2 VIEW COMPARISON:  May 20, 2017 FINDINGS: No edema or consolidation. Heart size and pulmonary vascularity are normal. No adenopathy. No pneumothorax. No bone lesions. IMPRESSION: No edema or consolidation. Electronically Signed   By: Bretta Bang III M.D.   On: 09/26/2018 15:24    Procedures Procedures (including critical care time)  Medications Ordered in ED Medications  alum & mag hydroxide-simeth (MAALOX/MYLANTA) 200-200-20 MG/5ML suspension 30 mL (30 mLs Oral Given 09/26/18 1436)    And  lidocaine (XYLOCAINE) 2 % viscous mouth solution 15 mL (15 mLs Oral Given 09/26/18 1436)  famotidine (PEPCID) tablet 20 mg (20 mg Oral  Given  09/26/18 1436)     Initial Impression / Assessment and Plan / ED Course  I have reviewed the triage vital signs and the nursing notes.  Pertinent labs & imaging results that were available during my care of the patient were reviewed by me and considered in my medical decision making (see chart for details).     37 y.o. female here with chest pain x1 wk intermittently. [facility], has been seen by GI last month and was supposed to be on PPI and carafate however hasn't been consistent with it. On exam, mild epigastric TTP, nonperitoneal, neg murphy's sign, with diffuse chest wall TTP, clear lungs, no tachycardia or hypoxia, no leg swelling, PERC neg. Overall symptoms c/w GERD/gastritis and possibly musculoskeletal chest wall pain, however given that this is her second visit in 2 days, will check labs, EKG, CXR, and give GI cocktail and pepcid, and reassess. Doubt PE, doubt need for d-dimer. Doubt gallbladder pathology, doubt need for U/S. Will reassess shortly.   4:54 PM CBC w/diff WNL. CMP with K 5.4 but hemolyzed, Tbili 1.5 but otherwise remainder of CMP WNL; no EKG changes to suggest hyperkalemia, I suspect it's from hemolysis. Lipase WNL. U/A with Hgb but otherwise no evidence of infection or other concerning findings (pt on menses). BetaHCG neg. Trop neg. EKG without acute ischemic findings. CXR negative for acute findings.  Overall, symptoms consistent with gastritis/GERD/PUD and musculoskeletal chest pain. Doubt other emergent pathology, doubt need for further emergent work up. Discussed diet/lifestyle modifications for symptoms, advised using rx's already given to her by her prior providers (PPI, carafate, etc), advised tylenol and avoidance/sparing use of NSAIDs only on full stomach, discussed other OTC remedies for symptomatic relief, and f/up with PCP or GI in 5-7 days for recheck of symptoms and ongoing evaluation/management. I explained the diagnosis  and have given explicit precautions to return to the ER including for any other new or worsening symptoms. The patient understands and accepts the medical plan as it's been dictated and I have answered their questions. Discharge instructions concerning home care and prescriptions have been given. The patient is STABLE and is discharged to home in good condition.    Final Clinical Impressions(s) / ED Diagnoses   Final diagnoses:  Precordial chest pain  Epigastric pain  Gastroesophageal reflux disease, esophagitis presence not specified    ED Discharge Orders    [address]       Shloime Keilman, RavannaMercedes, New JerseyPA-C 09/26/18 1654    Alvira MondaySchlossman, Erin, MD 09/26/18 [phone]

## 2018-09-26 NOTE — ED Triage Notes (Signed)
Pt presents with c/o chest soreness and soreness around her breast. [facility]. Pt reports she has a difficult time belching without pain.

## 2018-09-26 NOTE — Telephone Encounter (Signed)
Pt has been sched 10/12/2018 at 9:45 AM sched w/pt

## 2018-09-26 NOTE — Discharge Instructions (Signed)
Your symptoms could be from gastritis/indigestion or an ulcer, or could be from gas pain, muscle pain, and other nonemergent issues. You should take your Omeprazole and Carafate as directed by your prior doctor's from earlier visits, and avoid spicy/fatty/acidic foods, avoid soda/coffee/tea/alcohol. Avoid laying down flat within 30 minutes of eating. Avoid NSAIDs like ibuprofen/aleve/motrin/etc on an empty stomach. May consider using over the counter tums/maalox/zantac/etc as needed for additional relief. Use tylenol as needed for pain. You may consider using heat to the areas of pain, no more than 20 minutes every hour. Follow up with your gastroenterologist and/or your regular doctor in 5-7 days for recheck of symptoms. Return to the ER for changes or worsening symptoms.   SEEK IMMEDIATE MEDICAL ATTENTION IF: You develop a fever.  Your chest pains become severe or intolerable.  You develop new, unexplained symptoms (problems).  You develop shortness of breath, nausea, vomiting, sweating or feel light headed.  You develop a new cough or you cough up blood. You develop new leg swelling

## 2018-09-26 NOTE — Telephone Encounter (Signed)
PT call in to sched a EDG pleas advise on scheduling thanks

## 2018-09-30 ENCOUNTER — Encounter (HOSPITAL_COMMUNITY): Payer: Self-pay | Admitting: Emergency Medicine

## 2018-09-30 ENCOUNTER — Emergency Department (HOSPITAL_COMMUNITY)
Admission: EM | Admit: 2018-09-30 | Discharge: 2018-09-30 | Disposition: A | Discharge status: Home / Self Care | Payer: BLUE CROSS/BLUE SHIELD | Attending: Emergency Medicine | Admitting: Emergency Medicine

## 2018-09-30 DIAGNOSIS — N644 Mastodynia: Secondary | ICD-10-CM | POA: Diagnosis not present

## 2018-09-30 DIAGNOSIS — Z79899 Other long term (current) drug therapy: Secondary | ICD-10-CM | POA: Diagnosis not present

## 2018-09-30 NOTE — Discharge Instructions (Signed)
Try using heat on the sore breasts as well as tylenol or ibuprofen for pain.

## 2018-09-30 NOTE — ED Triage Notes (Signed)
Pt c/o bilateral breast tenderness. Pt states minor relief with heat, but is not taking OTC pain meds.

## 2018-09-30 NOTE — ED Provider Notes (Signed)
[facility]-EMERGENCY DEPT Provider Note   CSN: [id number] Arrival date & time: 09/30/18  1013     History   Chief Complaint Chief Complaint  Patient presents with  . breast tenderness    HPI Theresa Mooney is a 38 y.o. female.  Patient is a 38 year old female presenting today with complaints of bilateral breast tenderness that is been going on for approximately 1 week.  She states for the last several months around the time of her cycle she is developed breast tenderness.  She states she sometimes can have pain in her nipples bilaterally even between menses.  She denies any nipple discharge, color changes of her skin or abscesses.  She has not taken anything for this and states that she does follow-up with family planning and was going to see them to get a mammogram scheduled.  She denies any trauma, fever or infectious symptoms.  Her menses is currently ending now.  She has a tubal ligation for birth control.  She takes no hormonal suppliments or implants  The history is provided by the patient.    Past Medical History:  Diagnosis Date  . 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 Elevated glucose   . Herpes 2002  . Infection   . Mixed hyperlipidemia   . Nicotine dependence   . Trichimoniasis 12-2010  . Urinary calculi     Patient Active Problem List   Diagnosis Date Noted  . h/o postpartum BTL  12/13/2011  . S/P ectopic pregnancy 12/13/2011    Past Surgical History:  Procedure Laterality Date  . DILATION AND CURETTAGE OF UTERUS    . LAPAROSCOPY  11/27/2011   Procedure: LAPAROSCOPY OPERATIVE;  Surgeon: Catalina AntiguaPeggy Constant, MD;  Location: [facility];  Service: Gynecology;  Laterality: Bilateral;  . SALPINGECTOMY     Bilateral for twin ectopic preg.   . TUBAL LIGATION  12-05-2010     OB History    Gravida  7   Para  5   Term      Preterm      AB  2   Living  5     SAB      TAB  1   Ectopic  1   Multiple      Live Births  1              Home Medications    Prior to Admission medications   Medication Sig Start Date End Date Taking? Authorizing Provider  fluticasone (FLONASE) 50 MCG/ACT nasal spray Place 1 spray into both nostrils daily. Patient taking differently: Place 1 spray into both nostrils daily as needed for allergies.  07/31/18  Yes Burky, Dorene GrebeNatalie B, NP  gabapentin (NEURONTIN) 300 MG capsule Take 1 capsule (300 mg total) by mouth 3 (three) times daily. 08/09/18  Yes Loletta SpecterGomez, Roger David, PA-C  omeprazole (PRILOSEC) 40 MG capsule Take 1 capsule (40 mg total) by mouth daily. 08/17/18  Yes Tressia DanasBeavers, Kimberly, MD  sucralfate (CARAFATE) 1 g tablet Take 1 tablet (1 g total) by mouth 4 (four) times daily. 08/17/18  Yes Tressia DanasBeavers, Kimberly, MD  hyoscyamine (LEVSIN SL) 0.125 MG SL tablet Place 1 tablet (0.125 mg total) under the tongue every 4 (four) hours as needed (heartburn). 09/25/18   Lurline IdolMurrill, Samantha, FNP    Family History Family History  Problem Relation Age of Onset  . Hypertension Mother   . Diabetes Maternal Grandmother   . Heart disease Maternal Grandmother   . Hypertension Maternal Grandmother   .  Diabetes Maternal Grandfather   . Heart disease Maternal Grandfather   . Hypertension Maternal Grandfather   . Hyperlipidemia Daughter   . Anesthesia problems Neg Hx     Social History Social History   Tobacco Use  . Smoking status: Never Smoker  . Smokeless tobacco: Never Used  Substance Use Topics  . Alcohol use: Not Currently    Comment: social  . Drug use: No     Allergies   Hydrocodone   Review of Systems Review of Systems  All other systems reviewed and are negative.    Physical Exam Updated Vital Signs BP 116/78 (BP Location: Left Arm)   Pulse 82   Temp 98.4 F (36.9 C) (Oral)   Resp 16   LMP 09/26/2018   SpO2 99%   Physical Exam Vitals signs and nursing note reviewed.  Constitutional:      General: She is not in acute distress.    Appearance: Normal appearance. She is  normal weight.  HENT:     Head: Normocephalic.  Cardiovascular:     Rate and Rhythm: Normal rate.  Pulmonary:     Effort: Pulmonary effort is normal.  Chest:     Breasts:        Right: Tenderness present. No bleeding, inverted nipple, mass, nipple discharge or skin change.        Left: Tenderness present. No bleeding, inverted nipple, mass, nipple discharge or skin change.     Comments: Tenderness with palpation in bilateral upper breast.  No notable masses. Skin:    General: Skin is warm and dry.     Capillary Refill: Capillary refill takes less than 2 seconds.  Neurological:     General: No focal deficit present.     Mental Status: She is alert. Mental status is at baseline.  Psychiatric:        Mood and Affect: Mood normal.        Behavior: Behavior normal.      ED Treatments / Results  Labs (all labs ordered are listed, but only abnormal results are displayed) Labs Reviewed - No data to display  EKG None  Radiology No results found.  Procedures Procedures (including critical care time)  Medications Ordered in ED Medications - No data to display   Initial Impression / Assessment and Plan / ED Course  I have reviewed the triage vital signs and the nursing notes.  Pertinent labs & imaging results that were available during my care of the patient were reviewed by me and considered in my medical decision making (see chart for details).     Patient presenting today with bilateral breast tenderness.  This is been occurring for several months.  It seems to be pronounced around the time of her menses.  She is feeling knots in her breast but denies any color change or nipple discharge.  She has no evidence of abscess today, skin changes or discharge.  Discussed with patient this could be related to hormonal fluxes but it would be important for her to go get a mammogram.  She does see a physician with family-planning who can order that for her.  Discussed that we are unable  to order that test from the emergency department but that she should get this done through her regular doctor.  Final Clinical Impressions(s) / ED Diagnoses   Final diagnoses:  Breast tenderness in female    ED Discharge Orders    None       Gwyneth SproutPlunkett, Beryle Zeitz, MD 09/30/18  1139  

## 2018-10-02 ENCOUNTER — Telehealth (INDEPENDENT_AMBULATORY_CARE_PROVIDER_SITE_OTHER): Payer: Self-pay | Admitting: Physician Assistant

## 2018-10-02 NOTE — Telephone Encounter (Signed)
Patient called to request a referral to the breast center to have a mammograph done. States that she has pain in both breast for 3 weeks and the pain goes down her back as well.  Please advice [phone]  Thank you Theresa Mooney

## 2018-10-02 NOTE — Telephone Encounter (Signed)
Pt should make appointment with next provider to discuss these symptoms.

## 2018-10-02 NOTE — Telephone Encounter (Signed)
FWD to PCP. Meriam Chojnowski S Chaselynn Kepple, CMA  

## 2018-10-03 NOTE — Telephone Encounter (Signed)
Patient voicemail full. She is scheduled for 10/17/2018 with [facility]. Maryjean Morn, CMA

## 2018-10-07 ENCOUNTER — Encounter: Payer: Self-pay | Admitting: Emergency Medicine

## 2018-10-07 ENCOUNTER — Ambulatory Visit
Admission: EM | Admit: 2018-10-07 | Discharge: 2018-10-07 | Disposition: A | Discharge status: Home / Self Care | Payer: BLUE CROSS/BLUE SHIELD | Attending: Family Medicine | Admitting: Family Medicine

## 2018-10-07 ENCOUNTER — Other Ambulatory Visit: Payer: Self-pay

## 2018-10-07 DIAGNOSIS — N760 Acute vaginitis: Secondary | ICD-10-CM | POA: Diagnosis not present

## 2018-10-07 DIAGNOSIS — N644 Mastodynia: Secondary | ICD-10-CM | POA: Insufficient documentation

## 2018-10-07 MED ORDER — CLINDAMYCIN HCL 150 MG PO CAPS: 150.0000 mg | ORAL_CAPSULE | Freq: Four times a day (QID) | ORAL | 0 refills | Status: DC

## 2018-10-07 NOTE — ED Triage Notes (Signed)
PT stated she has been having white discharge and breast tenderness for a week. No pain in her stomach from this but hx of gastric issues.  No frequent urination, some blood in urine but from hx of urology.

## 2018-10-07 NOTE — Discharge Instructions (Addendum)
We will treat you today for BV Sending swab for testing.  We will call with any positive results.  Your breast exam did not reveal any masses  Follow up with you doctor Monday for the referral

## 2018-10-08 NOTE — ED Provider Notes (Signed)
[facility]    CSN: [id number] Arrival date & time: 10/07/18  1331     History   Chief Complaint Chief Complaint  Patient presents with  . Breast Pain  . Vaginal Discharge    HPI Theresa Mooney is a 38 y.o. female.   Patient is a 38 year old female presents for vaginal discharge, odor, irritation x1 week.  Her symptoms have been constant and remain the same.  She does have a history of bacterial vaginosis.  She is currently sexually active with one partner.  She has not done anything to treat her symptoms. Patient's last menstrual period was 09/26/2018.  Birth control is tubal ligation.  She denies any associated fever, chills, back pain, pelvic pain, abdominal pain.  Denies any urinary symptoms.  She is also complaining of breast tenderness.  This is been going on for many months.  It is waxing waning and worsening around her menstrual cycle.  She has been seen for this multiple times.  Last time she was seen for this was 09/30/2017 in the ER.  She is seen her primary about the same issue.  She is post to have referral to the breast center.  Denies any fever, chills, erythema, masses.  Denies any nipple discharge.  He has been using essential oils to the breast with some relief of the discomfort.  ROS per HPI       Past Medical History:  Diagnosis Date  . 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 Elevated glucose   . Herpes 2002  . Infection   . Mixed hyperlipidemia   . Nicotine dependence   . Trichimoniasis 12-2010  . Urinary calculi     Patient Active Problem List   Diagnosis Date Noted  . h/o postpartum BTL  12/13/2011  . S/P ectopic pregnancy 12/13/2011    Past Surgical History:  Procedure Laterality Date  . DILATION AND CURETTAGE OF UTERUS    . LAPAROSCOPY  11/27/2011   Procedure: LAPAROSCOPY OPERATIVE;  Surgeon: Catalina AntiguaPeggy Constant, MD;  Location: [facility];  Service: Gynecology;  Laterality: Bilateral;  . SALPINGECTOMY     Bilateral for twin ectopic  preg.   . TUBAL LIGATION  12-05-2010    OB History    Gravida  7   Para  5   Term      Preterm      AB  2   Living  5     SAB      TAB  1   Ectopic  1   Multiple      Live Births  1            Home Medications    Prior to Admission medications   Medication Sig Start Date End Date Taking? Authorizing Provider  clindamycin (CLEOCIN) 150 MG capsule Take 1 capsule (150 mg total) by mouth every 6 (six) hours. 10/07/18   Gaynelle Pastrana, Gloris Manchesterraci A, NP  fluticasone (FLONASE) 50 MCG/ACT nasal spray Place 1 spray into both nostrils daily. Patient taking differently: Place 1 spray into both nostrils daily as needed for allergies.  07/31/18   Georgetta HaberBurky, Natalie B, NP  gabapentin (NEURONTIN) 300 MG capsule Take 1 capsule (300 mg total) by mouth 3 (three) times daily. 08/09/18   Loletta SpecterGomez, Roger David, PA-C  hyoscyamine (LEVSIN SL) 0.125 MG SL tablet Place 1 tablet (0.125 mg total) under the tongue every 4 (four) hours as needed (heartburn). 09/25/18   Lurline IdolMurrill, Samantha, FNP  omeprazole (PRILOSEC) 40 MG capsule Take 1  capsule (40 mg total) by mouth daily. 08/17/18   Tressia DanasBeavers, Kimberly, MD  sucralfate (CARAFATE) 1 g tablet Take 1 tablet (1 g total) by mouth 4 (four) times daily. 08/17/18   Tressia DanasBeavers, Kimberly, MD    Family History Family History  Problem Relation Age of Onset  . Hypertension Mother   . Diabetes Maternal Grandmother   . Heart disease Maternal Grandmother   . Hypertension Maternal Grandmother   . Diabetes Maternal Grandfather   . Heart disease Maternal Grandfather   . Hypertension Maternal Grandfather   . Hyperlipidemia Daughter   . Anesthesia problems Neg Hx     Social History Social History   Tobacco Use  . Smoking status: Never Smoker  . Smokeless tobacco: Never Used  Substance Use Topics  . Alcohol use: Not Currently    Comment: social  . Drug use: No     Allergies   Hydrocodone   Review of Systems Review of Systems   Physical Exam Triage Vital Signs ED  Triage Vitals  Enc Vitals Group     BP 10/07/18 1351 121/78     Pulse Rate 10/07/18 1351 70     Resp --      Temp 10/07/18 1351 (!) 97.3 F (36.3 C)     Temp Source 10/07/18 1351 Oral     SpO2 10/07/18 1351 98 %     Weight 10/07/18 1349 193 lb (87.5 kg)     Height 10/07/18 1349 5' (1.524 m)     Head Circumference --      Peak Flow --      Pain Score 10/07/18 1349 5     Pain Loc --      Pain Edu? --      Excl. in GC? --    No data found.  Updated Vital Signs BP 121/78 (BP Location: Right Arm)   Pulse 70   Temp (!) 97.3 F (36.3 C) (Oral)   Ht 5' (1.524 m)   Wt 193 lb (87.5 kg)   LMP 09/26/2018   SpO2 98%   BMI 37.69 kg/m   Visual Acuity Right Eye Distance:   Left Eye Distance:   Bilateral Distance:    Right Eye Near:   Left Eye Near:    Bilateral Near:     Physical Exam Vitals signs and nursing note reviewed.  Constitutional:      General: She is not in acute distress.    Appearance: Normal appearance. She is not ill-appearing, toxic-appearing or diaphoretic.  HENT:     Head: Normocephalic and atraumatic.     Nose: Nose normal.  Eyes:     Conjunctiva/sclera: Conjunctivae normal.  Neck:     Musculoskeletal: Normal range of motion.  Pulmonary:     Effort: Pulmonary effort is normal.  Chest:     Chest wall: Tenderness present.     Breasts: Breasts are symmetrical.        Right: Normal. No swelling, bleeding, inverted nipple, mass, nipple discharge, skin change or tenderness.        Left: Normal. No swelling, bleeding, inverted nipple, mass, nipple discharge or tenderness.       Comments: Tenderness to mid chest wall.  Nontender to breast.  No masses, edema, dimpling or nipple discharge.  Abdominal:     Palpations: Abdomen is soft.     Tenderness: There is no abdominal tenderness. There is no right CVA tenderness or left CVA tenderness.  Genitourinary:    Comments: Deferred  Musculoskeletal: Normal range  of motion.  Skin:    General: Skin is warm and  dry.  Neurological:     Mental Status: She is alert.  Psychiatric:        Mood and Affect: Mood normal.      UC Treatments / Results  Labs (all labs ordered are listed, but only abnormal results are displayed) Labs Reviewed  CERVICOVAGINAL ANCILLARY ONLY    EKG None  Radiology No results found.  Procedures Procedures (including critical care time)  Medications Ordered in UC Medications - No data to display  Initial Impression / Assessment and Plan / UC Course  I have reviewed the triage vital signs and the nursing notes.  Pertinent labs & imaging results that were available during my care of the patient were reviewed by me and considered in my medical decision making (see chart for details).    Breast tenderness Breast exam normal.  Patient tender to chest wall. She has been seen for this multiple times.  She is supposed to have a referral to the breast center this coming Monday.  Instructed to contact her doctor Monday.  Vaginitis Patient having vaginal discharge x1 week.  History of BV.  We will go ahead and treat for BV Self swab sent for testing Lab results pending we will call with any positive results.   Final Clinical Impressions(s) / UC Diagnoses   Final diagnoses:  Vaginitis and vulvovaginitis  Breast tenderness in female     Discharge Instructions     We will treat you today for BV Sending swab for testing.  We will call with any positive results.  Your breast exam did not reveal any masses  Follow up with you doctor Monday for the referral     ED Prescriptions    Medication Sig Dispense Auth. Provider   clindamycin (CLEOCIN) 150 MG capsule Take 1 capsule (150 mg total) by mouth every 6 (six) hours. 28 capsule Dahlia Byes A, NP     Controlled Substance Prescriptions [place] Controlled Substance Registry consulted? Not Applicable   Janace Aris, NP 10/08/18 1022

## 2018-10-09 LAB — CERVICOVAGINAL ANCILLARY ONLY
Bacterial vaginitis: POSITIVE — AB
Candida vaginitis: NEGATIVE
Chlamydia: NEGATIVE
Neisseria Gonorrhea: NEGATIVE
Trichomonas: NEGATIVE

## 2018-10-10 ENCOUNTER — Other Ambulatory Visit: Payer: Self-pay | Admitting: Specialist

## 2018-10-10 DIAGNOSIS — N644 Mastodynia: Secondary | ICD-10-CM

## 2018-10-11 ENCOUNTER — Ambulatory Visit (INDEPENDENT_AMBULATORY_CARE_PROVIDER_SITE_OTHER): Payer: BLUE CROSS/BLUE SHIELD | Admitting: Family Medicine

## 2018-10-11 ENCOUNTER — Encounter: Payer: Self-pay | Admitting: Family Medicine

## 2018-10-11 VITALS — BP 93/60 | HR 86 | Temp 98.8°F | Resp 17 | Ht 60.0 in | Wt 188.0 lb

## 2018-10-11 DIAGNOSIS — M25532 Pain in left wrist: Secondary | ICD-10-CM

## 2018-10-11 DIAGNOSIS — Z3202 Encounter for pregnancy test, result negative: Secondary | ICD-10-CM | POA: Diagnosis not present

## 2018-10-11 DIAGNOSIS — M25531 Pain in right wrist: Secondary | ICD-10-CM | POA: Diagnosis not present

## 2018-10-11 DIAGNOSIS — N644 Mastodynia: Secondary | ICD-10-CM | POA: Diagnosis not present

## 2018-10-11 DIAGNOSIS — Z1389 Encounter for screening for other disorder: Secondary | ICD-10-CM | POA: Diagnosis not present

## 2018-10-11 DIAGNOSIS — R3121 Asymptomatic microscopic hematuria: Secondary | ICD-10-CM

## 2018-10-11 DIAGNOSIS — M797 Fibromyalgia: Secondary | ICD-10-CM | POA: Diagnosis not present

## 2018-10-11 DIAGNOSIS — Z7689 Persons encountering health services in other specified circumstances: Secondary | ICD-10-CM

## 2018-10-11 MED ORDER — DULOXETINE HCL 20 MG PO CPEP: 20.0000 mg | ORAL_CAPSULE | Freq: Two times a day (BID) | ORAL | 3 refills | Status: DC

## 2018-10-11 MED ORDER — DICLOFENAC SODIUM 1 % TD GEL: 4.0000 g | Freq: Four times a day (QID) | TRANSDERMAL | 1 refills | Status: DC

## 2018-10-11 MED ORDER — MELOXICAM 7.5 MG PO TABS: 7.5000 mg | ORAL_TABLET | Freq: Every day | ORAL | 2 refills | Status: DC | PRN

## 2018-10-11 NOTE — Patient Instructions (Addendum)
[facility]    Theresa Mooney was seen by Joaquin CourtsKimberly Harris, FNP today.   Theresa Mooney primary care provider is Bing NeighborsHarris, Kimberly S, FNP.   For the best care possible, you should try to see Joaquin CourtsKimberly Harris, FNP-C whenever you come to the clinic.   We look forward to seeing you again soon!  If you have any questions about your visit today, please call us at [phone] or feel free to reach your primary care provider via MyChart.         Fibromyalgia Myofascial pain syndrome and fibromyalgia are both pain disorders. This pain may be felt mainly in your muscles.  Myofascial pain syndrome: ? Always has tender points in the muscle that will cause pain when pressed (trigger points). The pain may come and go. ? Usually affects your neck, upper back, and shoulder areas. The pain often radiates into your arms and hands.  Fibromyalgia: ? Has muscle pains and tenderness that come and go. ? Is often associated with fatigue and sleep problems. ? Has trigger points. ? Tends to be long-lasting (chronic), but is not life-threatening. Fibromyalgia and myofascial pain syndrome are not the same. However, they often occur together. If you have both conditions, each can make the other worse. Both are common and can cause enough pain and fatigue to make day-to-day activities difficult. Both can be hard to diagnose because their symptoms are common in many other conditions. What are the causes? The exact causes of these conditions are not known. What increases the risk? You are more likely to develop this condition if:  You have a family history of the condition.  You have certain triggers, such as: ? Spine disorders. ? An injury (trauma) or other physical stressors. ? Being under a lot of stress. ? Medical conditions such as osteoarthritis, rheumatoid arthritis, or lupus. What are the signs or symptoms? Fibromyalgia The main  symptom of fibromyalgia is widespread pain and tenderness in your muscles. Pain is sometimes described as stabbing, shooting, or burning. You may also have:  Tingling or numbness.  Sleep problems and fatigue.  Problems with attention and concentration (fibro fog). Other symptoms may include:  Bowel and bladder problems.  Headaches.  Visual problems.  Problems with odors and noises.  Depression or mood changes.  Painful menstrual periods (dysmenorrhea).  Dry skin or eyes. These symptoms can vary over time. Myofascial pain syndrome Symptoms of myofascial pain syndrome include:  Tight, ropy bands of muscle.  Uncomfortable sensations in muscle areas. These may include aching, cramping, burning, numbness, tingling, and weakness.  Difficulty moving certain parts of the body freely (poor range of motion). How is this diagnosed? This condition may be diagnosed by your symptoms and medical history. You will also have a physical exam. In general:  Fibromyalgia is diagnosed if you have pain, fatigue, and other symptoms for more than 3 months, and symptoms cannot be explained by another condition.  Myofascial pain syndrome is diagnosed if you have trigger points in your muscles, and those trigger points are tender and cause pain elsewhere in your body (referred pain). How is this treated? Treatment for these conditions depends on the type that you have.  For fibromyalgia: ? Pain medicines, such as NSAIDs. ? Medicines for treating depression. ? Medicines for treating seizures. ? Medicines that relax the muscles.  For myofascial pain: ? Pain medicines, such as NSAIDs. ? Cooling and stretching of muscles. ?  Trigger point injections. ? Sound wave (ultrasound) treatments to stimulate muscles. Treating these conditions often requires a team of health care providers. These may include:  Your primary care provider.  Physical therapist.  Complementary health care providers, such  as massage therapists or acupuncturists.  Psychiatrist for cognitive behavioral therapy. Follow these instructions at home: Medicines  Take over-the-counter and prescription medicines only as told by your health care provider.  Do not drive or use heavy machinery while taking prescription pain medicine.  If you are taking prescription pain medicine, take actions to prevent or treat constipation. Your health care provider may recommend that you: ? Drink enough fluid to keep your urine pale yellow. ? Eat foods that are high in fiber, such as fresh fruits and vegetables, whole grains, and beans. ? Limit foods that are high in fat and processed sugars, such as fried or sweet foods. ? Take an over-the-counter or prescription medicine for constipation. Lifestyle   Exercise as directed by your health care provider or physical therapist.  Practice relaxation techniques to control your stress. You may want to try: ? Biofeedback. ? Visual imagery. ? Hypnosis. ? Muscle relaxation. ? Yoga. ? Meditation.  Maintain a healthy lifestyle. This includes eating a healthy diet and getting enough sleep.  Do not use any products that contain nicotine or tobacco, such as cigarettes and e-cigarettes. If you need help quitting, ask your health care provider. General instructions  Talk to your health care provider about complementary treatments, such as acupuncture or massage.  Consider joining a support group with others who are diagnosed with this condition.  Do not do activities that stress or strain your muscles. This includes repetitive motions and heavy lifting.  Keep all follow-up visits as told by your health care provider. This is important. Where to find more information  National Fibromyalgia Association: www.fmaware.org  Arthritis Foundation: www.arthritis.org  American Chronic Pain Association: www.theacpa.org Contact a health care provider if:  You have new symptoms.  Your  symptoms get worse or your pain is severe.  You have side effects from your medicines.  You have trouble sleeping.  Your condition is causing depression or anxiety. Summary  Myofascial pain syndrome and fibromyalgia are pain disorders.  Myofascial pain syndrome has tender points in the muscle that will cause pain when pressed (trigger points). Fibromyalgia also has muscle pains and tenderness that come and go, but this condition is often associated with fatigue and sleep disturbances.  Fibromyalgia and myofascial pain syndrome are not the same but often occur together, causing pain and fatigue that make day-to-day activities difficult.  Treatment for fibromyalgia includes taking medicines to relax the muscles and medicines for pain, depression, or seizures. Treatment for myofascial pain syndrome includes taking medicines for pain, cooling and stretching of muscles, and injecting medicines into trigger points.  Follow your health care provider's instructions for taking medicines and maintaining a healthy lifestyle. This information is not intended to replace advice given to you by your health care provider. Make sure you discuss any questions you have with your health care provider. Document Released: 09/13/2005 Document Revised: 09/28/2017 Document Reviewed: 09/28/2017 Elsevier Interactive Patient Education  2019 [employer].

## 2018-10-11 NOTE — Progress Notes (Signed)
Theresa Mooney, is a 38 y.o. female  JJH:[id number]  YJE:[id number]  DOB - 11/08/1980  CC:  Chief Complaint  Patient presents with  . Establish Care  . Generalized Body Aches    B breast pain. is scheduled to have mammo done on 10/16/18. also possible B carpal tunnel(R>L) is R hand dominant       HPI: Theresa Mooney is a 38 y.o. female is here today to establish care.   Theresa Mooney has h/o postpartum BTL  and S/P ectopic pregnancy on their problem list.   Bilateral Wrist Pain Bilateral wrist pain on-going intermittently for one  Year. Pain is now radiating upward from wrist and up to the elbow. 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 The wrist pain has gradually worsened.  She has not taken any OTC medications.  She has not attempted relief with wrist splints. She chronically takes gabapentin however reports this has not improved pain at all.  No prior work-up by Orthopedics. Wrist pain has been an intermittent problem in the past however normally resolves within intervention.  She feels this episode of bilateral wrist pain is worse and would like to follow-up with orthopedic specialist.  Generalized Body Aches  Generalized body aches has been a generalized ongoing issue.  She was previously followed at [facility] clinic and worked up extensively for source of her pain.  At one point a previous healthcare provider mentioned that patient likely suffered from fibromyalgia.  She has had normal rheumatoid panel and lupus screening is also been negative.  She is currently not prescribed an SSRI.  She has been prescribed gabapentin without significant relief of generalized pain symptoms.  The pain had improved until Christmas she reports drinking alcohol and eating foods rich in fat.  She had previously changed her diet to try to reduce inflammation after blood work revealed an elevated sed rate. She also uses Tumeric which has been helpful in management of generalized  pain. She doesn't engage in routine physical activity. Her current Body mass index is 36.72 kg/m.  She has not attempted relief with antiinflammatory therapy as an adjunct to Gabapentin.   Patient denies new headaches, chest pain, abdominal pain, nausea, new weakness , numbness or tingling, SOB, edema, or worrisome cough.     Current medications: Current Outpatient Medications:  .  clindamycin (CLEOCIN) 150 MG capsule, Take 1 capsule (150 mg total) by mouth every 6 (six) hours., Disp: 28 capsule, Rfl: 0 .  gabapentin (NEURONTIN) 300 MG capsule, Take 1 capsule (300 mg total) by mouth 3 (three) times daily., Disp: 90 capsule, Rfl: 3 .  omeprazole (PRILOSEC) 40 MG capsule, Take 1 capsule (40 mg total) by mouth daily., Disp: 90 capsule, Rfl: 3 .  sucralfate (CARAFATE) 1 g tablet, Take 1 tablet (1 g total) by mouth 4 (four) times daily., Disp: 120 tablet, Rfl: 3   Pertinent family medical history: family history includes Diabetes in her maternal grandfather and maternal grandmother; Heart disease in her maternal grandfather and maternal grandmother; Hyperlipidemia in her daughter; Hypertension in her maternal grandfather, maternal grandmother, and mother.   Allergies  Allergen Reactions  . Hydrocodone Nausea Only    Dizziness    Social History   Socioeconomic History  . Marital status: Single    Spouse name: Not on file  . Number of children: Not on file  . Years of education: Not on file  . Highest education level: Not on file  Occupational History  . Not  on file  Social Needs  . Financial resource strain: Not on file  . Food insecurity:    Worry: Not on file    Inability: Not on file  . Transportation needs:    Medical: Not on file    Non-medical: Not on file  Tobacco Use  . Smoking status: Never Smoker  . Smokeless tobacco: Never Used  Substance and Sexual Activity  . Alcohol use: Not Currently    Comment: social  . Drug use: No  . Sexual activity: Yes    Birth  control/protection: Surgical  Lifestyle  . Physical activity:    Days per week: Not on file    Minutes per session: Not on file  . Stress: Not on file  Relationships  . Social connections:    Talks on phone: Not on file    Gets together: Not on file    Attends religious service: Not on file    Active member of club or organization: Not on file    Attends meetings of clubs or organizations: Not on file    Relationship status: Not on file  . Intimate partner violence:    Fear of current or ex partner: Not on file    Emotionally abused: Not on file    Physically abused: Not on file    Forced sexual activity: Not on file  Other Topics Concern  . Not on file  Social History Narrative  . Not on file    Review of Systems: Constitutional: Negative for fever, chills, diaphoresis, activity change, appetite change and fatigue. HENT: Negative for ear pain, nosebleeds, congestion, facial swelling, rhinorrhea, neck pain, neck stiffness and ear discharge.  Eyes: Negative for pain, discharge, redness, itching and visual disturbance. Respiratory: Negative for cough, choking, chest tightness, shortness of breath, wheezing and stridor.  Cardiovascular: Negative for chest pain, palpitations and leg swelling. Gastrointestinal: Negative for abdominal distention. Genitourinary: Negative for dysuria, urgency, frequency, hematuria, flank pain, decreased urine volume, difficulty urinating. Musculoskeletal: Negative for back pain, joint swelling, arthralgia and gait problem. Neurological: Negative for dizziness, tremors, seizures, syncope, facial asymmetry, speech difficulty, weakness, light-headedness, numbness and headaches.  Hematological: Negative for adenopathy. Does not bruise/bleed easily. Psychiatric/Behavioral: Negative for hallucinations, behavioral problems, confusion, dysphoric mood, decreased concentration and agitation.  Objective:   Vitals:   10/11/18 1009  BP: 93/60  Pulse: 86  Resp:  17  Temp: 98.8 F (37.1 C)  SpO2: 96%    BP Readings from Last 3 Encounters:  10/11/18 93/60  10/07/18 121/78  09/30/18 116/78    Filed Weights   10/11/18 1009  Weight: 188 lb (85.3 kg)      Physical Exam: Constitutional: Patient appears well-developed and well-nourished. No distress. HENT: Normocephalic, atraumatic, External right and left ear normal. Oropharynx is clear and moist.  Eyes: Conjunctivae and EOM are normal. PERRLA, no scleral icterus. Neck: Normal ROM. Neck supple. No JVD. No tracheal deviation. No thyromegaly. CVS: RRR, S1/S2 +, no murmurs, no gallops, no carotid bruit.  Pulmonary: Effort and breath sounds normal, no stridor, rhonchi, wheezes, rales.  Abdominal: Soft. BS +, no distension, tenderness, rebound or guarding.  Musculoskeletal: Bilateral wrist Positive Phalen's test. Tenderness with range of motion. Trace edema bilateral wrist.  Neuro: Alert. Normal muscle tone coordination. Normal gait. BUE and BLE strength 5/5. Bilateral hand grips symmetrical. Skin: Skin is warm and dry. No rash noted. Not diaphoretic. No erythema. No pallor. Psychiatric: Normal mood and affect. Behavior, judgment, thought content normal. Lab Results (prior encounters)  Lab  Results  Component Value Date   WBC 4.7 09/26/2018   HGB 13.0 09/26/2018   HCT 44.5 09/26/2018   MCV 93.7 09/26/2018   PLT 265 09/26/2018   Lab Results  Component Value Date   CREATININE 0.97 09/26/2018   BUN 12 09/26/2018   NA 137 09/26/2018   K 5.4 (H) 09/26/2018   CL 104 09/26/2018   CO2 23 09/26/2018    Lab Results  Component Value Date   HGBA1C 4.8 08/08/2018    No results found for: CHOL, TRIG, HDL, CHOLHDL, VLDL, LDLCALC      Assessment and plan:  1. Encounter to establish care 2. Screening for blood or protein in urine -UA unremarkable, pregnancy negative   3. Breast pain -Patient has a pending ultrasound of the breast ordered and [facility].   4.  Fibromyalgia Trial Cymbalta 20 mg twice daily for treatment and management of fibromyalgia symptoms. Checking: - Sedimentation Rate, if elevated would recommend daily meloxicam for at least 30 days to reduce inflammation.  5. Bilateral wrist pain  Exam findings are significant for what appears to be carpal tunnel syndrome.  Patient advised to purchase an over-the-counter wrist splint to use while at work.  I am referring her to orthopedic surgery for further work-up and evaluation. - Sedimentation Rate - Ambulatory referral to Orthopedic Surgery  Meds ordered this encounter  Medications  . diclofenac sodium (VOLTAREN) 1 % GEL    Sig: Apply 4 g topically 4 (four) times daily.    Dispense:  100 g    Refill:  1  . DULoxetine (CYMBALTA) 20 MG capsule    Sig: Take 1 capsule (20 mg total) by mouth 2 (two) times daily.    Dispense:  30 capsule    Refill:  3  . meloxicam (MOBIC) 7.5 MG tablet    Sig: Take 1 tablet (7.5 mg total) by mouth daily as needed for pain.    Dispense:  30 tablet    Refill:  2     Orders Placed This Encounter  Procedures  . Sedimentation Rate  . Ambulatory referral to Orthopedic Surgery    Referral Priority:   Routine    Referral Type:   Surgical    Referral Reason:   Specialty Services Required    Requested Specialty:   Orthopedic Surgery    Number of Visits Requested:   5  . POCT urine pregnancy  . POCT URINALYSIS DIP (CLINITEK)     Return in about 6 weeks (around 11/22/2018).   The patient was given clear instructions to go to ER or return to medical center if symptoms don't improve, worsen or new problems develop. The patient verbalized understanding. The patient was advised  to call and obtain lab results if they haven't heard anything from out office within 7-10 business days.  Joaquin CourtsKimberly Kerri-Anne Haeberle, FNP [facility] [address], [place] [id number] [phone]: [phone]    This note has been created with  Dragon speech recognition software and Paediatric nursesmart phrase technology. Any transcriptional errors are unintentional.

## 2018-10-12 ENCOUNTER — Ambulatory Visit: Payer: BLUE CROSS/BLUE SHIELD | Admitting: Gastroenterology

## 2018-10-12 LAB — SEDIMENTATION RATE: Sed Rate: 97 mm/hr — ABNORMAL HIGH (ref 0–32)

## 2018-10-12 NOTE — Progress Notes (Deleted)
Referring Provider: Clent Demark, PA-C Primary Care Physician:  Scot Jun, FNP   Chief Complaint:  Abdominal pain   IMPRESSION:  Epigastric abdominal pain    - no source identified on ultrasound on renal protocol CT    - normal liver enzymes and lipase ESR 37, CRP 8 Frequent NSAIDs  Differential: NSAID-related PUD, esophagitis, gastritis, functional pain and less likely hepatobiliary sources.  PLAN: Records from [facility] (On [facility] Dr. 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  The interval history is obtained through the patient and review of her electronic health record.  She was treated with omeprazole 40 mg BID x 8 weeks, Carafate 1g BID-QID, and lifestyle modifications were reviewed.   Prior complaints: Multiple complaints started in July. Patient is most concerned about back and bilateral flank pain. But, also has abdominal pain. Attributed to drinking too much lemonade in July. Stopped all alcohol including hard core liquor to attempt to improve the pain earlier this year. Prior MJ use but not since January 2019.  H pylori by breath test was negative in March 2019. GI referral recommended but insurance/finances prevented her from coming at that time.  Started feeling better and was actually symptom free.   Flare of symptoms began a few weeks ago. Started after taking Flagyl for bacterial vaginoosis. Feels like it's putting more acid in there. Pain is now described as a  midepigastric, constant, non-radiating, stabbing. Associated mild nausea. Some heartburn. Pain worsens with alcohol and chocolate.  Broccoli and cabbage improve her symptoms. + eructation. No  dysphagia or odynophagia. Good appetite. No weight change. No change in bowel habits. No change in pain with defecation. Pain has been severe enough that she has been evaluated in the ED and an urgent care.   Stopped taking ibuprofen 800 mg two weeks ago. Previously prescribed for headache, arthralgias and sinus pain. Dr. Altamease Oiler started Nexium in addition to Carafate.   No prior evaluation by GI. No prior endoscopy.   Grandmother had similar problems and had PUD.   US abdomen 06/13/17 was unremarkable. CT renal stone from 11/27/17 revealed "Multiple bilateral nonobstructing renal calculi measuring up to 3 mm".  Labs from November show a normal CBC and CMP including liver enzymes, normal lipase, and Sed rate 37, CRP 8   Past Medical History:  Diagnosis Date  . Cannabis dependence in remission ([place])   . Elevated glucose   . Herpes 2002  . Infection   . Mixed hyperlipidemia   . Nicotine dependence   . Trichimoniasis 12-2010  . Urinary calculi     Past Surgical History:  Procedure Laterality Date  . DILATION AND CURETTAGE OF UTERUS    . LAPAROSCOPY  11/27/2011   Procedure: LAPAROSCOPY OPERATIVE;  Surgeon: Mora Bellman, MD;  Location: [facility];  Service: Gynecology;  Laterality: Bilateral;  . SALPINGECTOMY     Bilateral for twin ectopic preg.   . TUBAL LIGATION  12-05-2010    Current Outpatient Medications  Medication Sig Dispense Refill  . clindamycin (CLEOCIN) 150 MG capsule Take 1 capsule (150 mg total) by mouth every 6 (six) hours. 28 capsule 0  . diclofenac sodium (VOLTAREN) 1 % GEL Apply 4 g topically  4 (four) times daily. 100 g 1  . DULoxetine (CYMBALTA) 20 MG capsule Take 1 capsule (20 mg total) by mouth 2 (two) times daily. 30 capsule 3  . gabapentin (NEURONTIN) 300 MG capsule Take 1 capsule (300 mg total) by mouth 3 (three) times daily. 90 capsule 3  . meloxicam (MOBIC) 7.5 MG tablet Take 1 tablet (7.5 mg total) by mouth daily as needed for pain. 30 tablet 2  . omeprazole  (PRILOSEC) 40 MG capsule Take 1 capsule (40 mg total) by mouth daily. 90 capsule 3  . sucralfate (CARAFATE) 1 g tablet Take 1 tablet (1 g total) by mouth 4 (four) times daily. 120 tablet 3   No current facility-administered medications for this visit.     Allergies as of 10/12/2018 - Review Complete 10/11/2018  Allergen Reaction Noted  . Hydrocodone Nausea Only 06/13/2018    Family History  Problem Relation Age of Onset  . Hypertension Mother   . Diabetes Maternal Grandmother   . Heart disease Maternal Grandmother   . Hypertension Maternal Grandmother   . Diabetes Maternal Grandfather   . Heart disease Maternal Grandfather   . Hypertension Maternal Grandfather   . Hyperlipidemia Daughter   . Anesthesia problems Neg Hx     Social History   Socioeconomic History  . Marital status: Single    Spouse name: Not on file  . Number of children: Not on file  . Years of education: Not on file  . Highest education level: Not on file  Occupational History  . Not on file  Social Needs  . Financial resource strain: Not on file  . Food insecurity:    Worry: Not on file    Inability: Not on file  . Transportation needs:    Medical: Not on file    Non-medical: Not on file  Tobacco Use  . Smoking status: Never Smoker  . Smokeless tobacco: Never Used  Substance and Sexual Activity  . Alcohol use: Not Currently    Comment: social  . Drug use: No  . Sexual activity: Yes    Birth control/protection: Surgical  Lifestyle  . Physical activity:    Days per week: Not on file    Minutes per session: Not on file  . Stress: Not on file  Relationships  . Social connections:    Talks on phone: Not on file    Gets together: Not on file    Attends religious service: Not on file    Active member of club or organization: Not on file    Attends meetings of clubs or organizations: Not on file    Relationship status: Not on file  . Intimate partner violence:    Fear of current or ex partner:  Not on file    Emotionally abused: Not on file    Physically abused: Not on file    Forced sexual activity: Not on file  Other Topics Concern  . Not on file  Social History Narrative  . Not on file    Review of Systems: 12 system ROS is negative except as noted above with the additions of back pain, hematuria, headaches.  There were no vitals filed for this visit.  Physical Exam: Vital signs were reviewed. General:   Alert, well-nourished, pleasant and cooperative in NAD Head:  Normocephalic and atraumatic. Eyes:  Sclera clear, no icterus.   Conjunctiva pink. Mouth:  No deformity or lesions.   Neck:  Supple; no thyromegaly. Lungs:  Clear throughout to auscultation.  No wheezes. Heart:  Regular rate and rhythm; no murmurs Abdomen:  Soft, mild epigastric tenderness, normal bowel sounds. No rebound or guarding. No hepatosplenomegaly Rectal:  Deferred  Msk:  Symmetrical without gross deformities. Extremities:  No gross deformities or edema. Neurologic:  Alert and  oriented x4;  grossly nonfocal Skin:  No rash or bruise. Psych:  Alert and cooperative. Normal mood and affect.   Coalton Arch L. Tarri Glenn Md, MPH [facility] 10/12/2018, 8:51 AM

## 2018-10-13 ENCOUNTER — Telehealth: Payer: Self-pay | Admitting: Family Medicine

## 2018-10-13 LAB — POCT URINALYSIS DIP (CLINITEK)
BILIRUBIN UA: NEGATIVE
Glucose, UA: NEGATIVE mg/dL
Leukocytes, UA: NEGATIVE
Nitrite, UA: NEGATIVE
PH UA: 5.5 (ref 5.0–8.0)
POC PROTEIN,UA: NEGATIVE
Spec Grav, UA: >=1.03 — AB (ref 1.010–1.025)
Urobilinogen, UA: 0.2 E.U./dL

## 2018-10-13 LAB — POCT URINE PREGNANCY: Preg Test, Ur: NEGATIVE

## 2018-10-13 NOTE — Telephone Encounter (Signed)
Do you have any suggestions on foods that cause inflammation that the patient can avoid?

## 2018-10-13 NOTE — Telephone Encounter (Signed)
I had a very detailed discussion with the patient during her office visit regarding avoiding alcohol, fried foods, processed foods, foods rich in sugar, and high sodium foods. All of which can increase inflammation. Any other foods not within this food group are ok to eat. She can also utilize sources on the Internet to find options for meal preparations.  As mention in my chart message, she is to take Meloxicam daily until her follow-up next month.

## 2018-10-13 NOTE — Telephone Encounter (Signed)
Patient would like clarification on blood work, patient would like to know what she can or can't eat. Please follow up.

## 2018-10-13 NOTE — Telephone Encounter (Signed)
Patient notified

## 2018-10-16 ENCOUNTER — Other Ambulatory Visit: Payer: BLUE CROSS/BLUE SHIELD

## 2018-10-16 ENCOUNTER — Ambulatory Visit: Payer: BLUE CROSS/BLUE SHIELD

## 2018-10-16 ENCOUNTER — Other Ambulatory Visit: Payer: Self-pay | Admitting: Nurse Practitioner

## 2018-10-16 ENCOUNTER — Ambulatory Visit
Admission: RE | Admit: 2018-10-16 | Discharge: 2018-10-16 | Disposition: A | Discharge status: Home / Self Care | Payer: BLUE CROSS/BLUE SHIELD | Source: Ambulatory Visit | Attending: Specialist | Admitting: Specialist

## 2018-10-16 ENCOUNTER — Telehealth: Payer: Self-pay | Admitting: Family Medicine

## 2018-10-16 DIAGNOSIS — N644 Mastodynia: Secondary | ICD-10-CM

## 2018-10-16 NOTE — Telephone Encounter (Signed)
Patient called because she has some questions about the issues going on with her breast and her lab results, please follow up.

## 2018-10-16 NOTE — Telephone Encounter (Signed)
There is nothing further to discuss until after her breast imaging is done. This is scheduled for today. Once the reports are back she will be contacted.

## 2018-10-17 ENCOUNTER — Ambulatory Visit (INDEPENDENT_AMBULATORY_CARE_PROVIDER_SITE_OTHER): Payer: Self-pay | Admitting: Family Medicine

## 2018-10-19 ENCOUNTER — Telehealth: Payer: Self-pay

## 2018-10-19 DIAGNOSIS — K2990 Gastroduodenitis, unspecified, without bleeding: Principal | ICD-10-CM

## 2018-10-19 DIAGNOSIS — K2970 Gastritis, unspecified, without bleeding: Secondary | ICD-10-CM

## 2018-10-19 NOTE — Telephone Encounter (Signed)
Patient called stating that the pharmacy needs a prior authorization from insurance before they can release  diclofenac sodium (VOLTAREN) 1 % GEL [[id number]]  Patient also is requesting referral to [facility] because she states [facility] is no longer taking her insurance.  Patient would also like to know what the cause of the large amount of blood in her urine was.  Please follow up.   

## 2018-10-19 NOTE — Telephone Encounter (Signed)
I've reviewed patient's insurance & it looks like she has Development worker, community Enhanced with [facility]. [facility] is not in network so she will need to be referred to a [facility] location

## 2018-10-19 NOTE — Telephone Encounter (Signed)
I am placing a new referral to   Gastroenterology at [facility] [address], [place], [place] [address]  Advise patient I am uncertain of the cause of hematuria. This could be do to several benign cause, there could be thinning in the line of the endometrium prior to menstruation which would cause blood in urine. Menstrual periods could cause blood in urine. In review of EMR, she has had a couple instances of hematuria. I don't have a specific explanation, as I've only seen her in office once. If she would like additional work-up, i'll be happy to place a referral to [facility] given last present in urine for some time.  Please advise the patient appointment.

## 2018-10-19 NOTE — Telephone Encounter (Signed)
Prior authorization initiated using Covermymeds. Waiting to hear back on determination.

## 2018-10-19 NOTE — Telephone Encounter (Signed)
Patient called stating that the pharmacy needs a prior authorization from insurance before they can release  diclofenac sodium (VOLTAREN) 1 % GEL [[id number]]  Patient also is requesting referral to [facility] because she states [facility] is no longer taking her insurance.  Patient would also like to know what the cause of the large amount of blood in her urine was.  Please follow up.

## 2018-10-20 NOTE — Telephone Encounter (Signed)
mychart message sent

## 2018-10-20 NOTE — Telephone Encounter (Signed)
Patient would like to be referred back to Urology for second opinion.

## 2018-10-20 NOTE — Addendum Note (Signed)
Addended by: Bing Neighbors on: 10/20/2018 09:16 PM   Modules accepted: Orders

## 2018-10-27 ENCOUNTER — Ambulatory Visit: Payer: Self-pay | Admitting: Obstetrics and Gynecology

## 2018-11-06 ENCOUNTER — Ambulatory Visit (INDEPENDENT_AMBULATORY_CARE_PROVIDER_SITE_OTHER): Payer: Self-pay | Admitting: Orthopaedic Surgery

## 2018-11-16 ENCOUNTER — Encounter: Payer: Self-pay | Admitting: Obstetrics & Gynecology

## 2018-11-16 ENCOUNTER — Encounter: Payer: BLUE CROSS/BLUE SHIELD | Admitting: Obstetrics & Gynecology

## 2018-11-22 ENCOUNTER — Ambulatory Visit: Payer: BLUE CROSS/BLUE SHIELD | Admitting: Family Medicine

## 2018-12-18 ENCOUNTER — Telehealth: Payer: BLUE CROSS/BLUE SHIELD | Admitting: Obstetrics & Gynecology

## 2018-12-18 ENCOUNTER — Other Ambulatory Visit: Payer: Self-pay

## 2018-12-18 ENCOUNTER — Telehealth: Payer: Self-pay | Admitting: Obstetrics & Gynecology

## 2018-12-18 DIAGNOSIS — N920 Excessive and frequent menstruation with regular cycle: Secondary | ICD-10-CM

## 2018-12-18 NOTE — Progress Notes (Signed)
She was scheduled for a telemedicine visit.  I called and let the phone ring for about 20 rings. She did not answer and no voicemail was available.

## 2018-12-18 NOTE — Telephone Encounter (Signed)
Called the patient to inform of the cancellation of appointment with the option of a telehealth visit. The patient stated she would like to discuss some concerns with the nurse. Changed the visit to a virtual and informed the patient to expect a call around the time of her appointment.

## 2019-03-26 ENCOUNTER — Other Ambulatory Visit: Payer: Self-pay

## 2019-03-26 ENCOUNTER — Encounter (HOSPITAL_COMMUNITY): Payer: Self-pay | Admitting: Emergency Medicine

## 2019-03-26 ENCOUNTER — Ambulatory Visit (HOSPITAL_COMMUNITY)
Admission: EM | Admit: 2019-03-26 | Discharge: 2019-03-26 | Disposition: A | Discharge status: Home / Self Care | Payer: BLUE CROSS/BLUE SHIELD | Attending: Family Medicine | Admitting: Family Medicine

## 2019-03-26 DIAGNOSIS — R809 Proteinuria, unspecified: Secondary | ICD-10-CM | POA: Diagnosis present

## 2019-03-26 DIAGNOSIS — R1030 Lower abdominal pain, unspecified: Secondary | ICD-10-CM | POA: Diagnosis not present

## 2019-03-26 DIAGNOSIS — Z113 Encounter for screening for infections with a predominantly sexual mode of transmission: Secondary | ICD-10-CM

## 2019-03-26 DIAGNOSIS — Z202 Contact with and (suspected) exposure to infections with a predominantly sexual mode of transmission: Secondary | ICD-10-CM | POA: Insufficient documentation

## 2019-03-26 DIAGNOSIS — N898 Other specified noninflammatory disorders of vagina: Secondary | ICD-10-CM | POA: Diagnosis not present

## 2019-03-26 DIAGNOSIS — Z3202 Encounter for pregnancy test, result negative: Secondary | ICD-10-CM | POA: Diagnosis not present

## 2019-03-26 HISTORY — DX: Unspecified ectopic pregnancy without intrauterine pregnancy: O00.90

## 2019-03-26 LAB — POCT URINALYSIS DIP (DEVICE)
Glucose, UA: NEGATIVE mg/dL
Ketones, ur: NEGATIVE mg/dL
Leukocytes,Ua: NEGATIVE
Nitrite: NEGATIVE
Protein, ur: >=300 mg/dL — AB
Specific Gravity, Urine: >=1.03 (ref 1.005–1.030)
Urobilinogen, UA: 2 mg/dL — ABNORMAL HIGH (ref 0.0–1.0)
pH: 6 (ref 5.0–8.0)

## 2019-03-26 LAB — POCT PREGNANCY, URINE: Preg Test, Ur: NEGATIVE

## 2019-03-26 MED ORDER — AZITHROMYCIN 250 MG PO TABS
1000.0000 mg | ORAL_TABLET | Freq: Once | ORAL | Status: AC
  Administered 2019-03-26: 1000 mg via ORAL

## 2019-03-26 MED ORDER — LIDOCAINE HCL 2 % IJ SOLN
INTRAMUSCULAR | Status: AC
  Filled 2019-03-26: qty 20

## 2019-03-26 MED ORDER — CEFTRIAXONE SODIUM 250 MG IJ SOLR
INTRAMUSCULAR | Status: AC
  Filled 2019-03-26: qty 250

## 2019-03-26 MED ORDER — AZITHROMYCIN 250 MG PO TABS
ORAL_TABLET | ORAL | Status: AC
  Filled 2019-03-26: qty 4

## 2019-03-26 MED ORDER — CEFTRIAXONE SODIUM 250 MG IJ SOLR
250.0000 mg | Freq: Once | INTRAMUSCULAR | Status: AC
  Administered 2019-03-26: 250 mg via INTRAMUSCULAR

## 2019-03-26 NOTE — ED Triage Notes (Signed)
Pt sts some lower abd and back pain with vaginal discharge and odor

## 2019-03-26 NOTE — Discharge Instructions (Addendum)
You were treated for STD exposure today with antibiotics.  Additional tests were sent and we will call you if additional treatment is needed.  All sexual partners need to be treated.  You have protein in your urine today and this needs to be followed up by your primary care provider in 1 to 2 weeks.

## 2019-03-26 NOTE — ED Provider Notes (Signed)
[facility]    CSN: [id number] Arrival date & time: 03/26/19  1024     History   Chief Complaint Chief Complaint  Patient presents with  . Abdominal Pain  . Vaginal Discharge    HPI Theresa Mooney is a 38 y.o. female.   Patient presents today with lower abdominal pain which is intermittent and mild x3 days.  She states she started her menstrual cycle yesterday and it is very heavy; she would like to have a pregnancy test because she has had a previous ectopic pregnancy and is concerned for this.  She also reports vaginal discharge that is malodorous times several days.  She is sexually active and does not use birth control; he reports the same sexual partner x5 months.  Patient denies fever chills nausea vomiting diarrhea constipation or other symptoms.  Last bowel movement yesterday and normal.  LMP: 03/25/2019.   The history is provided by the patient. No language interpreter was used.    Past Medical History:  Diagnosis Date  . 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 Ectopic pregnancy   . Elevated glucose   . Herpes 2002  . Infection   . Mixed hyperlipidemia   . Nicotine dependence   . Trichimoniasis 12-2010  . Urinary calculi     Patient Active Problem List   Diagnosis Date Noted  . h/o postpartum BTL  12/13/2011  . S/P ectopic pregnancy 12/13/2011    Past Surgical History:  Procedure Laterality Date  . DILATION AND CURETTAGE OF UTERUS    . LAPAROSCOPY  11/27/2011   Procedure: LAPAROSCOPY OPERATIVE;  Surgeon: Mora Bellman, MD;  Location: [facility];  Service: Gynecology;  Laterality: Bilateral;  . SALPINGECTOMY     Bilateral for twin ectopic preg.   . TUBAL LIGATION  12-05-2010    OB History    Gravida  7   Para  5   Term      Preterm      AB  2   Living  5     SAB      TAB  1   Ectopic  1   Multiple      Live Births  1            Home Medications    Prior to Admission medications   Medication Sig Start Date End Date  Taking? Authorizing Provider  diclofenac sodium (VOLTAREN) 1 % GEL Apply 4 g topically 4 (four) times daily. 10/11/18   Scot Jun, FNP  DULoxetine (CYMBALTA) 20 MG capsule Take 1 capsule (20 mg total) by mouth 2 (two) times daily. 10/11/18   Scot Jun, FNP  gabapentin (NEURONTIN) 300 MG capsule Take 1 capsule (300 mg total) by mouth 3 (three) times daily. 08/09/18   Clent Demark, PA-C  meloxicam (MOBIC) 7.5 MG tablet Take 1 tablet (7.5 mg total) by mouth daily as needed for pain. 10/11/18   Scot Jun, FNP  omeprazole (PRILOSEC) 40 MG capsule Take 1 capsule (40 mg total) by mouth daily. 08/17/18   Thornton Park, MD  sucralfate (CARAFATE) 1 g tablet Take 1 tablet (1 g total) by mouth 4 (four) times daily. 08/17/18   Thornton Park, MD    Family History Family History  Problem Relation Age of Onset  . Hypertension Mother   . Diabetes Maternal Grandmother   . Heart disease Maternal Grandmother   . Hypertension Maternal Grandmother   . Diabetes Maternal Grandfather   . Heart disease  Maternal Grandfather   . Hypertension Maternal Grandfather   . Hyperlipidemia Daughter   . Breast cancer Maternal Aunt   . Anesthesia problems Neg Hx     Social History Social History   Tobacco Use  . Smoking status: Never Smoker  . Smokeless tobacco: Never Used  Substance Use Topics  . Alcohol use: Not Currently    Comment: social  . Drug use: No     Allergies   Hydrocodone   Review of Systems Review of Systems  Constitutional: Negative for chills and fever.  HENT: Negative for ear pain and sore throat.   Eyes: Negative for pain and visual disturbance.  Respiratory: Negative for cough and shortness of breath.   Cardiovascular: Negative for chest pain and palpitations.  Gastrointestinal: Positive for abdominal pain. Negative for vomiting.  Genitourinary: Positive for vaginal discharge. Negative for dysuria and hematuria.  Musculoskeletal: Negative for  arthralgias and back pain.  Skin: Negative for color change and rash.  Neurological: Negative for seizures and syncope.  All other systems reviewed and are negative.    Physical Exam Triage Vital Signs ED Triage Vitals  Enc Vitals Group     BP 03/26/19 1108 109/74     Pulse Rate 03/26/19 1108 64     Resp 03/26/19 1108 18     Temp 03/26/19 1108 98.4 F (36.9 C)     Temp Source 03/26/19 1108 Oral     SpO2 03/26/19 1108 99 %     Weight --      Height --      Head Circumference --      Peak Flow --      Pain Score 03/26/19 1109 8     Pain Loc --      Pain Edu? --      Excl. in GC? --    No data found.  Updated Vital Signs BP 109/74 (BP Location: Left Arm)   Pulse 64   Temp 98.4 F (36.9 C) (Oral)   Resp 18   SpO2 99%   Visual Acuity Right Eye Distance:   Left Eye Distance:   Bilateral Distance:    Right Eye Near:   Left Eye Near:    Bilateral Near:     Physical Exam Vitals signs and nursing note reviewed.  Constitutional:      General: She is not in acute distress.    Appearance: She is well-developed.  HENT:     Head: Normocephalic and atraumatic.  Eyes:     Conjunctiva/sclera: Conjunctivae normal.  Neck:     Musculoskeletal: Neck supple.  Cardiovascular:     Rate and Rhythm: Normal rate and regular rhythm.  Pulmonary:     Effort: Pulmonary effort is normal. No respiratory distress.     Breath sounds: Normal breath sounds.  Abdominal:     General: Bowel sounds are normal.     Palpations: Abdomen is soft.     Tenderness: There is no abdominal tenderness. There is no right CVA tenderness, left CVA tenderness, guarding or rebound.  Skin:    General: Skin is warm and dry.  Neurological:     Mental Status: She is alert.      UC Treatments / Results  Labs (all labs ordered are listed, but only abnormal results are displayed) Labs Reviewed  POCT URINALYSIS DIP (DEVICE) - Abnormal; Notable for the following components:      Result Value   Bilirubin  Urine SMALL (*)    Hgb urine dipstick LARGE (*)  Protein, ur >=300 (*)    Urobilinogen, UA 2.0 (*)    All other components within normal limits  POC URINE PREG, ED  POCT PREGNANCY, URINE  CERVICOVAGINAL ANCILLARY ONLY    EKG None  Radiology No results found.  Procedures Procedures (including critical care time)  Medications Ordered in UC Medications  cefTRIAXone (ROCEPHIN) injection 250 mg (250 mg Intramuscular Given 03/26/19 1302)  azithromycin (ZITHROMAX) tablet 1,000 mg (1,000 mg Oral Given 03/26/19 1302)  azithromycin (ZITHROMAX) 250 MG tablet (has no administration in time range)  cefTRIAXone (ROCEPHIN) 250 MG injection (has no administration in time range)  lidocaine (XYLOCAINE) 2 % (with pres) injection (has no administration in time range)    Initial Impression / Assessment and Plan / UC Course  I have reviewed the triage vital signs and the nursing notes.  Pertinent labs & imaging results that were available during my care of the patient were reviewed by me and considered in my medical decision making (see chart for details).  Clinical Course as of Mar 26 1307  Mon Mar 26, 2019  1228 POC urine pregnancy [KT]    Clinical Course User Index [KT] Mickie Bailate, Courtney Fenlon H, NP   STD exposure.  Proteinuria.  Treated today with Rocephin and Zithromax.  STD labs pending.  Discussed with patient that she needs to increase her oral water intake to 8 to 10 glasses/day.  Discussed that she needs to follow-up protein in her urine with her primary care provider in 1-2 weeks.  Discussed possible causes of proteinuria.  Final Clinical Impressions(s) / UC Diagnoses   Final diagnoses:  Exposure to sexually transmitted disease (STD)  Proteinuria, unspecified type     Discharge Instructions     You were treated for STD exposure today with antibiotics.  Additional tests were sent and we will call you if additional treatment is needed.  All sexual partners need to be treated.  You have  protein in your urine today and this needs to be followed up by your primary care provider in 1 to 2 weeks.     ED Prescriptions    None     Controlled Substance Prescriptions [place] Controlled Substance Registry consulted? Not Applicable   Mickie Bailate, Joaquim Tolen H, NP 03/26/19 1309

## 2019-03-27 LAB — CERVICOVAGINAL ANCILLARY ONLY
Chlamydia: NEGATIVE
Neisseria Gonorrhea: NEGATIVE
Trichomonas: NEGATIVE

## 2019-04-04 ENCOUNTER — Telehealth (INDEPENDENT_AMBULATORY_CARE_PROVIDER_SITE_OTHER): Payer: BLUE CROSS/BLUE SHIELD | Admitting: Family Medicine

## 2019-04-04 ENCOUNTER — Encounter: Payer: Self-pay | Admitting: Family Medicine

## 2019-04-04 ENCOUNTER — Other Ambulatory Visit (HOSPITAL_COMMUNITY)
Admission: RE | Admit: 2019-04-04 | Discharge: 2019-04-04 | Disposition: A | Discharge status: Home / Self Care | Payer: BLUE CROSS/BLUE SHIELD | Source: Ambulatory Visit | Attending: Family Medicine | Admitting: Family Medicine

## 2019-04-04 DIAGNOSIS — N898 Other specified noninflammatory disorders of vagina: Secondary | ICD-10-CM | POA: Diagnosis not present

## 2019-04-04 DIAGNOSIS — R809 Proteinuria, unspecified: Secondary | ICD-10-CM

## 2019-04-04 DIAGNOSIS — Z1389 Encounter for screening for other disorder: Secondary | ICD-10-CM

## 2019-04-04 LAB — POCT URINALYSIS DIP (CLINITEK)
Bilirubin, UA: NEGATIVE
Glucose, UA: NEGATIVE mg/dL
Leukocytes, UA: NEGATIVE
Nitrite, UA: NEGATIVE
POC PROTEIN,UA: NEGATIVE
Spec Grav, UA: >=1.03 — AB (ref 1.010–1.025)
Urobilinogen, UA: 0.2 E.U./dL
pH, UA: 5.5 (ref 5.0–8.0)

## 2019-04-04 MED ORDER — CLINDAMYCIN HCL 300 MG PO CAPS: 300.0000 mg | ORAL_CAPSULE | Freq: Two times a day (BID) | ORAL | 0 refills | Status: AC

## 2019-04-04 MED ORDER — SUCRALFATE 1 G PO TABS: 1.0000 g | ORAL_TABLET | Freq: Four times a day (QID) | ORAL | 3 refills | Status: DC

## 2019-04-04 NOTE — Progress Notes (Deleted)
Worked up patient for her Mychart visit with provider. She states that she is still having foul smelling vaginal discharge. Requested a swab be collected.

## 2019-04-04 NOTE — Progress Notes (Signed)
Virtual Visit via Video Note  I connected with Theresa Mooney on 04/04/19 at 10:10 AM EDT by a video enabled telemedicine application and verified that I am speaking with the correct person using two identifiers.  Location: Patient: Located is located in her personal vehicle  Provider: Located at primary care office     I discussed the limitations of evaluation and management by telemedicine and the availability of in person appointments. The patient expressed understanding and agreed to proceed.  History of Present Illness: Theresa Mooney is present via today's telemedicine encounter for evaluation of vaginal discharge. Recently seen on 03/26/19 at urgent care.  She was tested and treated presumptively for STDs.  Her vaginal specimen came back as all negative.  She reports today that she continues to have an odor rate discharge.  The discharge is not irritating, has not changed colors or volume.  She reports a history of bacterial vaginosis which she has been treated in the past with clindamycin.  She denies any recent sexual partner changes since her last urgent care visit.  She denies any dysuria.  During her urgent care visit she was seen to have a gross amount of protein in her urine.  She reports she has been eating large volumes of protein recent and this could have accounted for the protein.  She has been worked up recently in the past for hematuria which is persistently been present in urine.  She reports that the urologist advised her that this was a benign finding and could not identify any cause.  She has no other complaints today.Patient's last menstrual period was 03/23/2019.   Assessment and Plan: 1. Screening for blood or protein in urine - POCT URINALYSIS DIP (CLINITEK) - Urine Culture  2. Foul smelling vaginal discharge Treating for BV, clindamycin 300 mg twice daily x7 days. - Cervicovaginal ancillary only, pending  3. Proteinuria, unspecified type >300 mg noted on recent  UA. Repeat today is negative. Will culture urine to rule out early onset of UTI.   Follow Up Instructions: CPE in November   I discussed the assessment and treatment plan with the patient. The patient was provided an opportunity to ask questions and all were answered. The patient agreed with the plan and demonstrated an understanding of the instructions.   The patient was advised to call back or seek an in-person evaluation if the symptoms worsen or if the condition fails to improve as anticipated.  I provided 25 minutes of non-face-to-face time during this encounter.   Molli Barrows, FNP

## 2019-04-06 LAB — URINE CULTURE: Organism ID, Bacteria: NO GROWTH

## 2019-04-06 LAB — CERVICOVAGINAL ANCILLARY ONLY
Bacterial vaginitis: POSITIVE — AB
Candida vaginitis: NEGATIVE
Chlamydia: NEGATIVE
Neisseria Gonorrhea: NEGATIVE
Trichomonas: NEGATIVE

## 2019-05-28 ENCOUNTER — Ambulatory Visit
Admission: EM | Admit: 2019-05-28 | Discharge: 2019-05-28 | Disposition: A | Discharge status: Home / Self Care | Payer: BLUE CROSS/BLUE SHIELD | Attending: Physician Assistant | Admitting: Physician Assistant

## 2019-05-28 ENCOUNTER — Other Ambulatory Visit: Payer: Self-pay

## 2019-05-28 ENCOUNTER — Ambulatory Visit: Payer: BLUE CROSS/BLUE SHIELD | Admitting: Family Medicine

## 2019-05-28 DIAGNOSIS — N3090 Cystitis, unspecified without hematuria: Secondary | ICD-10-CM | POA: Insufficient documentation

## 2019-05-28 DIAGNOSIS — N898 Other specified noninflammatory disorders of vagina: Secondary | ICD-10-CM | POA: Insufficient documentation

## 2019-05-28 LAB — POCT URINALYSIS DIP (MANUAL ENTRY)
Bilirubin, UA: NEGATIVE
Glucose, UA: NEGATIVE mg/dL
Ketones, POC UA: NEGATIVE mg/dL
Nitrite, UA: NEGATIVE
Protein Ur, POC: NEGATIVE mg/dL
Spec Grav, UA: 1.015 (ref 1.010–1.025)
Urobilinogen, UA: 0.2 E.U./dL
pH, UA: 6.5 (ref 5.0–8.0)

## 2019-05-28 MED ORDER — CEPHALEXIN 500 MG PO CAPS: 500.0000 mg | ORAL_CAPSULE | Freq: Two times a day (BID) | ORAL | 0 refills | Status: DC

## 2019-05-28 MED ORDER — ONDANSETRON 4 MG PO TBDP: 4.0000 mg | ORAL_TABLET | Freq: Three times a day (TID) | ORAL | 0 refills | Status: DC | PRN

## 2019-05-28 MED ORDER — METRONIDAZOLE 500 MG PO TABS: 500.0000 mg | ORAL_TABLET | Freq: Two times a day (BID) | ORAL | 0 refills | Status: DC

## 2019-05-28 NOTE — ED Triage Notes (Signed)
Pt seen by provider prior to triage. 

## 2019-05-28 NOTE — Discharge Instructions (Addendum)
Your urine was positive for an urinary tract infection. Start keflex as directed. Cytology sent. You can take flagyl and use zofran for nausea if still have vaginal odor. Keep hydrated, urine should be clear to pale yellow in color. Monitor for any worsening of symptoms, fever, worsening abdominal pain, nausea/vomiting, flank pain, follow up for reevaluation.

## 2019-05-28 NOTE — ED Provider Notes (Signed)
[name]    CSN: [id number] Arrival date & time: 05/28/19  1152      History   Chief Complaint Chief Complaint  Patient presents with  . Urinary Tract Infection    HPI Theresa Mooney is a 38 y.o. female.   38 year old female comes in for few day history of urinary symptoms. She has had dysuria, urinary frequency. She has had abdominal pain/pressure during urination. Denies fever, chills, night sweats. Denies back/flank pain. She has had vaginal discharge for the past week and has vaginal odor and mild itching. LMP 05/19/2019. Sexually active, 1 female partner, no condom use. No birth control use.      Past Medical History:  Diagnosis Date  . 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 Ectopic pregnancy   . Elevated glucose   . Herpes 2002  . Infection   . Mixed hyperlipidemia   . Nicotine dependence   . Trichimoniasis 12-2010  . Urinary calculi     Patient Active Problem List   Diagnosis Date Noted  . h/o postpartum BTL  12/13/2011  . S/P ectopic pregnancy 12/13/2011    Past Surgical History:  Procedure Laterality Date  . DILATION AND CURETTAGE OF UTERUS    . LAPAROSCOPY  11/27/2011   Procedure: LAPAROSCOPY OPERATIVE;  Surgeon: Mora Bellman, MD;  Location: [facility];  Service: Gynecology;  Laterality: Bilateral;  . SALPINGECTOMY     Bilateral for twin ectopic preg.   . TUBAL LIGATION  12-05-2010    OB History    Gravida  7   Para  5   Term      Preterm      AB  2   Living  5     SAB      TAB  1   Ectopic  1   Multiple      Live Births  1            Home Medications    Prior to Admission medications   Medication Sig Start Date End Date Taking? Authorizing Provider  cephALEXin (KEFLEX) 500 MG capsule Take 1 capsule (500 mg total) by mouth 2 (two) times daily. 05/28/19   Tasia Catchings, Amy V, PA-C  DULoxetine (CYMBALTA) 20 MG capsule Take 1 capsule (20 mg total) by mouth 2 (two) times daily. 10/11/18   Scot Jun, FNP   gabapentin (NEURONTIN) 300 MG capsule Take 1 capsule (300 mg total) by mouth 3 (three) times daily. 08/09/18   Clent Demark, PA-C  metroNIDAZOLE (FLAGYL) 500 MG tablet Take 1 tablet (500 mg total) by mouth 2 (two) times daily. 05/28/19   Tasia Catchings, Amy V, PA-C  ondansetron (ZOFRAN ODT) 4 MG disintegrating tablet Take 1 tablet (4 mg total) by mouth every 8 (eight) hours as needed for nausea or vomiting. 05/28/19   Tasia Catchings, Amy V, PA-C  sucralfate (CARAFATE) 1 g tablet Take 1 tablet (1 g total) by mouth 4 (four) times daily. 04/04/19   Scot Jun, FNP    Family History Family History  Problem Relation Age of Onset  . Hypertension Mother   . Diabetes Maternal Grandmother   . Heart disease Maternal Grandmother   . Hypertension Maternal Grandmother   . Diabetes Maternal Grandfather   . Heart disease Maternal Grandfather   . Hypertension Maternal Grandfather   . Hyperlipidemia Daughter   . Breast cancer Maternal Aunt   . Anesthesia problems Neg Hx     Social History Social History  Tobacco Use  . Smoking status: Never Smoker  . Smokeless tobacco: Never Used  Substance Use Topics  . Alcohol use: Not Currently    Comment: social  . Drug use: No     Allergies   Hydrocodone   Review of Systems Review of Systems  Reason unable to perform ROS: See HPI as above.     Physical Exam Triage Vital Signs ED Triage Vitals  Enc Vitals Group     BP      Pulse      Resp      Temp      Temp src      SpO2      Weight      Height      Head Circumference      Peak Flow      Pain Score      Pain Loc      Pain Edu?      Excl. in GC?    No data found.  Updated Vital Signs BP (!) 139/91 (BP Location: Left Arm)   Pulse 62   Temp 97.9 F (36.6 C) (Oral)   Resp 18   SpO2 98%   Physical Exam Constitutional:      General: She is not in acute distress.    Appearance: She is well-developed. She is not ill-appearing, toxic-appearing or diaphoretic.  HENT:     Head:  Normocephalic and atraumatic.  Eyes:     Conjunctiva/sclera: Conjunctivae normal.     Pupils: Pupils are equal, round, and reactive to light.  Cardiovascular:     Rate and Rhythm: Normal rate and regular rhythm.     Heart sounds: Normal heart sounds. No murmur. No friction rub. No gallop.   Pulmonary:     Effort: Pulmonary effort is normal.     Breath sounds: Normal breath sounds. No wheezing or rales.  Abdominal:     General: Bowel sounds are normal.     Palpations: Abdomen is soft.     Tenderness: There is no abdominal tenderness. There is no right CVA tenderness, left CVA tenderness, guarding or rebound.  Skin:    General: Skin is warm and dry.  Neurological:     Mental Status: She is alert and oriented to person, place, and time.  Psychiatric:        Behavior: Behavior normal.        Judgment: Judgment normal.    UC Treatments / Results  Labs (all labs ordered are listed, but only abnormal results are displayed) Labs Reviewed  POCT URINALYSIS DIP (MANUAL ENTRY) - Abnormal; Notable for the following components:      Result Value   Clarity, UA cloudy (*)    Blood, UA moderate (*)    Leukocytes, UA Large (3+) (*)    All other components within normal limits  URINE CULTURE  CERVICOVAGINAL ANCILLARY ONLY    EKG   Radiology No results found.  Procedures Procedures (including critical care time)  Medications Ordered in UC Medications - No data to display  Initial Impression / Assessment and Plan / UC Course  I have reviewed the triage vital signs and the nursing notes.  Pertinent labs & imaging results that were available during my care of the patient were reviewed by me and considered in my medical decision making (see chart for details).  Urine dipstick positive for UTI. Start antibiotics as directed. Push fluids. Cytology sent.  Patient would like to be covered for BV empirically.  She  has had nausea with Flagyl in the past, will also add on Zofran as needed.   Discussed can wait until finishing Keflex before starting Flagyl.  Return precautions given.   Final Clinical Impressions(s) / UC Diagnoses   Final diagnoses:  Cystitis  Vaginal discharge   ED Prescriptions    Medication Sig Dispense Auth. Provider   cephALEXin (KEFLEX) 500 MG capsule Take 1 capsule (500 mg total) by mouth 2 (two) times daily. 10 capsule Yu, Amy V, PA-C   metroNIDAZOLE (FLAGYL) 500 MG tablet Take 1 tablet (500 mg total) by mouth 2 (two) times daily. 14 tablet Yu, Amy V, PA-C   ondansetron (ZOFRAN ODT) 4 MG disintegrating tablet Take 1 tablet (4 mg total) by mouth every 8 (eight) hours as needed for nausea or vomiting. 20 tablet Threasa AlphaYu, Amy V, PA-C        Yu, Amy V, [place]PA-C 05/28/19 1332

## 2019-05-29 LAB — URINE CULTURE

## 2019-05-30 LAB — CERVICOVAGINAL ANCILLARY ONLY
Chlamydia: NEGATIVE
Neisseria Gonorrhea: NEGATIVE
Trichomonas: NEGATIVE

## 2019-06-13 ENCOUNTER — Other Ambulatory Visit: Payer: Self-pay

## 2019-06-13 ENCOUNTER — Emergency Department (HOSPITAL_COMMUNITY)
Admission: EM | Admit: 2019-06-13 | Discharge: 2019-06-14 | Disposition: A | Discharge status: Home / Self Care | Payer: Medicaid Other | Attending: Emergency Medicine | Admitting: Emergency Medicine

## 2019-06-13 DIAGNOSIS — M545 Low back pain, unspecified: Secondary | ICD-10-CM

## 2019-06-13 DIAGNOSIS — Z79899 Other long term (current) drug therapy: Secondary | ICD-10-CM | POA: Insufficient documentation

## 2019-06-13 DIAGNOSIS — N898 Other specified noninflammatory disorders of vagina: Secondary | ICD-10-CM | POA: Diagnosis not present

## 2019-06-13 LAB — URINALYSIS, ROUTINE W REFLEX MICROSCOPIC
Bilirubin Urine: NEGATIVE
Glucose, UA: NEGATIVE mg/dL
Ketones, ur: NEGATIVE mg/dL
Nitrite: NEGATIVE
Protein, ur: NEGATIVE mg/dL
Specific Gravity, Urine: 1.024 (ref 1.005–1.030)
pH: 5 (ref 5.0–8.0)

## 2019-06-13 LAB — WET PREP, GENITAL
Clue Cells Wet Prep HPF POC: NONE SEEN
Sperm: NONE SEEN
Trich, Wet Prep: NONE SEEN
Yeast Wet Prep HPF POC: NONE SEEN

## 2019-06-13 LAB — PREGNANCY, URINE: Preg Test, Ur: NEGATIVE

## 2019-06-13 LAB — I-STAT CREATININE, ED: Creatinine, Ser: 0.7 mg/dL (ref 0.44–1.00)

## 2019-06-13 NOTE — Discharge Instructions (Addendum)
Your lab work today did not show any signs of yeast infection or BV today.  Your urine does not look infected. Please follow-up with women's health clinic as discussed.  Use the back exercises printed for your information from today's visit.  Continue using heat pad 20 minutes at a time 3-4 times a day your lower back to improve symptoms.  Continue using your diclofenac, can take twice daily with meals.

## 2019-06-13 NOTE — ED Provider Notes (Signed)
[facility] DEPT Provider Note   CSN: [id number] Arrival date & time: 06/13/19  1441     History   Chief Complaint Chief Complaint  Patient presents with  . Back Pain    HPI Theresa Mooney is a 38 y.o. female.  Patient complains of two weeks of back and bilateral flank pain that is achy intermittent and provoked from "doing to much" at work or urinating. Sometimes pain comes without provocation however.  Denies any symptoms at night.  Patient states she has history of PUD on Carafate and has recently been eating fried, greasy food and tomato based foods over the past 2 months.   Patient was dx with UTI 8/31 and started taking abx that day (keflex) and started flagyl after for empiric BV treatment which she is still taking. Last UTI was '07.   No fever, chills, no frequency/urgency, no gross blood in urine.   CT confirmed kidney stones 11/27/2017 which this feels similar to current pain.  Patient states that she has a long history of blood in her urine but states she does not see blood in her urine. Patient saw urologist and had cystoscopy within the last year that patient states was normal.   Patient denies any bowel or bladder incontinence, fever, IV drug use, cancer numbness, loss of motor control neck or back stiffness.      HPI  Past Medical History:  Diagnosis Date  . Cannabis dependence in remission ([facility])   . Ectopic pregnancy   . Elevated glucose   . Herpes 2002  . Infection   . Mixed hyperlipidemia   . Nicotine dependence   . Trichimoniasis 12-2010  . Urinary calculi     Patient Active Problem List   Diagnosis Date Noted  . h/o postpartum BTL  12/13/2011  . S/P ectopic pregnancy 12/13/2011    Past Surgical History:  Procedure Laterality Date  . DILATION AND CURETTAGE OF UTERUS    . LAPAROSCOPY  11/27/2011   Procedure: LAPAROSCOPY OPERATIVE;  Surgeon: Mora Bellman, MD;  Location: [facility];  Service: Gynecology;  Laterality:  Bilateral;  . SALPINGECTOMY     Bilateral for twin ectopic preg.   . TUBAL LIGATION  12-05-2010     OB History    Gravida  7   Para  5   Term      Preterm      AB  2   Living  5     SAB      TAB  1   Ectopic  1   Multiple      Live Births  1            Home Medications    Prior to Admission medications   Medication Sig Start Date End Date Taking? Authorizing Provider  DULoxetine (CYMBALTA) 20 MG capsule Take 1 capsule (20 mg total) by mouth 2 (two) times daily. 10/11/18  Yes Scot Jun, FNP  metroNIDAZOLE (FLAGYL) 500 MG tablet Take 1 tablet (500 mg total) by mouth 2 (two) times daily. 05/28/19  Yes Yu, Amy V, PA-C  sucralfate (CARAFATE) 1 g tablet Take 1 tablet (1 g total) by mouth 4 (four) times daily. Patient taking differently: Take 1 g by mouth 4 (four) times daily as needed (stomach issues).  04/04/19  Yes Scot Jun, FNP  cephALEXin (KEFLEX) 500 MG capsule Take 1 capsule (500 mg total) by mouth 2 (two) times daily. Patient not taking: Reported on 06/13/2019 05/28/19  Cathie Hoops, Amy V, PA-C  gabapentin (NEURONTIN) 300 MG capsule Take 1 capsule (300 mg total) by mouth 3 (three) times daily. Patient not taking: Reported on 06/13/2019 08/09/18   Loletta Specter, PA-C  ondansetron [facility] ODT) 4 MG disintegrating tablet Take 1 tablet (4 mg total) by mouth every 8 (eight) hours as needed for nausea or vomiting. Patient not taking: Reported on 06/13/2019 05/28/19   Lurline Idol    Family History Family History  Problem Relation Age of Onset  . Hypertension Mother   . Diabetes Maternal Grandmother   . Heart disease Maternal Grandmother   . Hypertension Maternal Grandmother   . Diabetes Maternal Grandfather   . Heart disease Maternal Grandfather   . Hypertension Maternal Grandfather   . Hyperlipidemia Daughter   . Breast cancer Maternal Aunt   . Anesthesia problems Neg Hx     Social History Social History   Tobacco Use  . Smoking status:  Never Smoker  . Smokeless tobacco: Never Used  Substance Use Topics  . Alcohol use: Not Currently    Comment: social  . Drug use: No     Allergies   Hydrocodone   Review of Systems Review of Systems  Constitutional: Negative for chills and fever.  HENT: Negative for congestion, sinus pressure and sinus pain.   Eyes: Negative for pain.  Respiratory: Negative for cough and shortness of breath.   Cardiovascular: Negative for chest pain and leg swelling.  Gastrointestinal: Negative for abdominal pain and vomiting.  Genitourinary: Positive for dysuria.  Musculoskeletal: Positive for back pain. Negative for myalgias and neck pain.  Skin: Negative for rash.  Neurological: Negative for dizziness and headaches.     Physical Exam Updated Vital Signs BP 120/71   Pulse 84   Temp 99.3 F (37.4 C)   Resp 16   SpO2 95%   Physical Exam Vitals signs and nursing note reviewed. Exam conducted with a chaperone present.  Constitutional:      General: She is not in acute distress.    Appearance: Normal appearance. She is not ill-appearing.  HENT:     Head: Normocephalic and atraumatic.     Mouth/Throat:     Mouth: Mucous membranes are moist.  Eyes:     General: No scleral icterus.       Right eye: No discharge.        Left eye: No discharge.     Conjunctiva/sclera: Conjunctivae normal.  Neck:     Musculoskeletal: Normal range of motion and neck supple. No neck rigidity.  Cardiovascular:     Rate and Rhythm: Normal rate and regular rhythm.     Pulses: Normal pulses.     Heart sounds: Normal heart sounds.  Pulmonary:     Effort: Pulmonary effort is normal.     Breath sounds: Normal breath sounds. No stridor.  Abdominal:     General: There is no distension.     Tenderness: There is abdominal tenderness (Suprapubic). There is no right CVA tenderness, left CVA tenderness or guarding.     Comments: Bilateral paralumbar flank tenderness to palpation  Genitourinary:    General:  Normal vulva.     Comments: Moderate white vaginal discharge present in vaginal vault. No CMT or adnexal tenderness. No apparent lesions to the vulva, vagina or cervix.  Musculoskeletal:     Right lower leg: No edema.     Left lower leg: No edema.     Comments: Patient is able to move neck normally,  bend forward.  Full range of motion of lower extremities.  Reproducible lumbar/flank tenderness to palpation that is mild.  Skin:    General: Skin is warm and dry.  Neurological:     Mental Status: She is alert and oriented to person, place, and time. Mental status is at baseline.  Psychiatric:        Mood and Affect: Mood normal.        Behavior: Behavior normal.      ED Treatments / Results  Labs (all labs ordered are listed, but only abnormal results are displayed) Labs Reviewed  WET PREP, GENITAL - Abnormal; Notable for the following components:      Result Value   WBC, Wet Prep HPF POC MODERATE (*)    All other components within normal limits  URINALYSIS, ROUTINE W REFLEX MICROSCOPIC - Abnormal; Notable for the following components:   APPearance HAZY (*)    Hgb urine dipstick MODERATE (*)    Leukocytes,Ua TRACE (*)    Bacteria, UA RARE (*)    All other components within normal limits  PREGNANCY, URINE  RPR  HIV ANTIBODY (ROUTINE TESTING W REFLEX)  I-STAT CREATININE, ED    EKG None  Radiology No results found.  Procedures Procedures (including critical care time)  Medications Ordered in ED Medications - No data to display   Initial Impression / Assessment and Plan / ED Course  I have reviewed the triage vital signs and the nursing notes.  Pertinent labs & imaging results that were available during my care of the patient were reviewed by me and considered in my medical decision making (see chart for details).        Patient is 38 year old female with recent UTI diagnosis and treatment in urgent care presenting for bilateral lower back pain 2 weeks and some  vaginal discharge and discomfort.   Patient presentation most concerning for muscular strain or kidney stone.  Patient has no red flag symptoms of back pain.  Doubt pyelonephritis or nephrolithiasis due to benign presentation, patient comfortable, normal vitals, negative CVA tenderness.  Moderate blood is chronic problem and patient's trace leuks and rare bacteria are reassuringly improved from urgent care UA. Will do pelvic exam to assess vaginal discomfort the patient describes. Pelvic exam within normal limits no concerning finds. Doubt TOA, PID, other infection.   Checked POC creatinine as patient was concerned for her kidney function.  This was within normal limits.    Pelvic exam was suspicious for yeast.  Wet prep is benign shows only moderate WBCs.  HIV/RPR collected per patient request.  Pregnancy test was negative.  Will discharge home with follow-up instructions to see women's health clinic.  Patient able to tolerate p.o. and in no acute distress at time of discharge.   Patient understanding of need for follow up with women's health. Do not feel any additional emergent workup is needed at this time.     Final Clinical Impressions(s) / ED Diagnoses   Final diagnoses:  None    ED Discharge Orders    None       Gailen ShelterFondaw, Almira Phetteplace S, [place] 06/13/19 2311    Linwood DibblesKnapp, Jon, MD 06/14/19 2027

## 2019-06-13 NOTE — ED Provider Notes (Signed)
Patient seen in conjunction with PA Fondaw.  Refer to his note for full history and physical examination.  Briefly, patient is a 38 year old female presenting for evaluation of progressively improving urinary symptoms, mild bilateral low back and pelvic pain that has been ongoing for several weeks.  She was seen at urgent care on 05/28/2019 and treated for both BV and UTI and reports improvement in her symptoms but has some mild vaginal irritation which worsens with urination.  Abdominal examination is benign, with no peritoneal signs.  She has mild bilateral low back pain reproducible on palpation, no midline spine tenderness or red flag signs concerning for cauda equina or spinal abscess.  No CVA tenderness.  Her UA today is equivocal, has some blood in it but she reports this is chronic and unchanged and she has seen urology for this in the past and even underwent cystoscopy.  Pelvic examination is not concerning for PID.  Low suspicion of ovarian torsion, ectopic regnancy, TOA, or acute surgical abdominal pathology.  Wet prep shows WBCs but no evidence of yeast or BV.  No further emergent work-up required at this time.  We will have the patient follow-up with OB/GYN for reevaluation on an outpatient basis.  We discussed conservative therapy and management of back pain with diclofenac which she has been prescribed previously which she states has been helpful.  Discussed strict ED return precautions. Patient verbalized understanding of and agreement with plan and is safe for discharge home at this time.    Debroah Baller 06/13/19 2306    Dorie Rank, MD 06/14/19 2027

## 2019-06-13 NOTE — ED Triage Notes (Signed)
Patient reports flank pain in her back. Patient reports she went out of town and "had a few drinks" and developed a UTI which she is being treated for but now is concerned about a kidney stone. Patient reports she has had them in the past.

## 2019-06-14 LAB — HIV ANTIBODY (ROUTINE TESTING W REFLEX): HIV Screen 4th Generation wRfx: NONREACTIVE

## 2019-06-14 LAB — RPR: RPR Ser Ql: NONREACTIVE

## 2019-07-09 ENCOUNTER — Ambulatory Visit (INDEPENDENT_AMBULATORY_CARE_PROVIDER_SITE_OTHER): Payer: Self-pay | Admitting: Family Medicine

## 2019-07-09 DIAGNOSIS — G8929 Other chronic pain: Secondary | ICD-10-CM

## 2019-07-09 DIAGNOSIS — M545 Low back pain, unspecified: Secondary | ICD-10-CM

## 2019-07-09 DIAGNOSIS — N390 Urinary tract infection, site not specified: Secondary | ICD-10-CM

## 2019-07-09 DIAGNOSIS — B9689 Other specified bacterial agents as the cause of diseases classified elsewhere: Secondary | ICD-10-CM

## 2019-07-09 DIAGNOSIS — N760 Acute vaginitis: Secondary | ICD-10-CM

## 2019-07-09 MED ORDER — SULFAMETHOXAZOLE-TRIMETHOPRIM 800-160 MG PO TABS: 1.0000 | ORAL_TABLET | Freq: Two times a day (BID) | ORAL | 0 refills | Status: DC

## 2019-07-09 MED ORDER — METRONIDAZOLE 1.3 % VA GEL: 1.0000 | Freq: Every day | VAGINAL | 1 refills | Status: DC

## 2019-07-09 NOTE — Progress Notes (Signed)
Following up on ED visit for lower back pain. States that she is still having sharp stabbing pain. Rates pain as 5/10. Nothing makes it better or worse. Denies nausea, vomiting, diarrhea, constipation.

## 2019-07-09 NOTE — Progress Notes (Signed)
Virtual Visit via Telephone Note  I connected with Theresa Mooney on 07/09/19 at 3:20 PM EDT by telephone and verified that I am speaking with the correct person using two identifiers.   I discussed the limitations, risks, security and privacy concerns of performing an evaluation and management service by telephone and the availability of in person appointments. I also discussed with the patient that there may be a patient responsible charge related to this service. The patient expressed understanding and agreed to proceed.  Patient Location: Home Provider Location: [facility] Others participating in call: none   History of Present Illness:      38 yo female with complaint of back pain which has improved status post ED visit but still with some mid-line back pain currently without radiation. She continues to have some urinary frequency. She feels that she may also have Bacterial Vaginosis-BV.  She reports mild vaginal discharge- mostly clear as well as mild vaginal odor similar to prior BV.  She denies abdominal, vaginal or pelvic pain. She does have an appointment scheduled with GYN in follow-up of her ED visit.   She would like to have treatment for bacterial vaginosis but would like to have metronidazole gel as she states that she is also recently resumed the use of alcohol.  Patient does have a history of gastritis and states that she has scheduled an appointment for follow-up with gastroenterology for later this month. She denies in blood in the stool and no black stools. She has also had some urinary frequency as well as mid to lower back pain and she believes she may still have a urinary tract infection. No blood in the urine and she does not have pain similar to past kidney stones. No fever or chills. No nausea. No loss of appetite. No headaches or dizziness. No chest pain or palpitations.   Past Medical History:  Diagnosis Date  . 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 Ectopic  pregnancy   . Elevated glucose   . Herpes 2002  . Infection   . Mixed hyperlipidemia   . Nicotine dependence   . Trichimoniasis 12-2010  . Urinary calculi     Past Surgical History:  Procedure Laterality Date  . DILATION AND CURETTAGE OF UTERUS    . LAPAROSCOPY  11/27/2011   Procedure: LAPAROSCOPY OPERATIVE;  Surgeon: Catalina Antigua, MD;  Location: [facility];  Service: Gynecology;  Laterality: Bilateral;  . SALPINGECTOMY     Bilateral for twin ectopic preg.   . TUBAL LIGATION  12-05-2010    Family History  Problem Relation Age of Onset  . Hypertension Mother   . Diabetes Maternal Grandmother   . Heart disease Maternal Grandmother   . Hypertension Maternal Grandmother   . Diabetes Maternal Grandfather   . Heart disease Maternal Grandfather   . Hypertension Maternal Grandfather   . Hyperlipidemia Daughter   . Breast cancer Maternal Aunt   . Anesthesia problems Neg Hx     Social History   Tobacco Use  . Smoking status: Never Smoker  . Smokeless tobacco: Never Used  Substance Use Topics  . Alcohol use: Not Currently    Comment: social  . Drug use: No     Allergies  Allergen Reactions  . Hydrocodone Nausea Only    Dizziness       Observations/Objective: No vital signs or physical exam conducted as visit was done via telephone  Assessment and Plan: 1. Chronic midline low back pain without sciatica Patient with  complaint of chronic issues with midline low back pain currently without radiation.  She may take over-the-counter pain medication as needed and or use warm moist heat or heating pad to the area to help with back pain which sounds as if it is likely musculoskeletal.  2. Recurrent UTI (urinary tract infection) Patient with history of recurrent urinary tract infections as well as history of kidney stones.  She denies any current significant mid back pain and no hematuria.  She has had prior follow-up with urology.  She is currently having some urinary frequency and  patient will be sent in prescription for Septra DS twice daily x3 days and patient should make sure that she increases her intake of water.  Make follow-up appointment in 1 to 2 weeks if symptoms do not improve. - sulfamethoxazole-trimethoprim (BACTRIM DS) 800-160 MG tablet; Take 1 tablet by mouth 2 (two) times daily.  Dispense: 6 tablet; Refill: 0  3. BV (bacterial vaginosis) She reports history of recurrent bacterial vaginosis and states that she feels as if she has an abnormal vaginal odor as well as mild vaginal discharge and would like to use metronidazole gel for treatment.  She does have follow-up appointment with gynecology for later this month. - metroNIDAZOLE 1.3 % GEL; Place 1 applicator vaginally at bedtime for 5 days.  Dispense: 5 g; Refill: 1  Follow Up Instructions:Return in about 2 weeks (around 07/23/2019) for Back pain/urinary symptoms-2 weeks if not better.    I discussed the assessment and treatment plan with the patient. The patient was provided an opportunity to ask questions and all were answered. The patient agreed with the plan and demonstrated an understanding of the instructions.   The patient was advised to call back or seek an in-person evaluation if the symptoms worsen or if the condition fails to improve as anticipated.  I provided 13 minutes of non-face-to-face time during this encounter.   Antony Blackbird, MD

## 2019-07-10 ENCOUNTER — Other Ambulatory Visit: Payer: Self-pay

## 2019-07-10 DIAGNOSIS — B9689 Other specified bacterial agents as the cause of diseases classified elsewhere: Secondary | ICD-10-CM

## 2019-07-10 DIAGNOSIS — N760 Acute vaginitis: Secondary | ICD-10-CM

## 2019-07-10 MED ORDER — METRONIDAZOLE 0.75 % VA GEL: 1.0000 | Freq: Every day | VAGINAL | 0 refills | Status: AC

## 2019-07-26 ENCOUNTER — Ambulatory Visit: Payer: Self-pay | Admitting: Obstetrics and Gynecology

## 2019-08-10 ENCOUNTER — Telehealth: Payer: Self-pay

## 2019-08-10 NOTE — Telephone Encounter (Signed)
Called patient to do their pre-visit COVID screening.  Have you tested positive for COVID or are you currently waiting for COVID test results? no  Have you recently traveled internationally([place], [place], [place], [place], [place]) or within the [place] to a hotspot area([place], [place], [place], [place], [place])? no  Are you currently experiencing any of the following symptoms: fever, cough, SHOB, fatigue, body aches, loss of smell/taste, rash, diarrhea, vomiting, severe headaches, weakness, sore throat? no  Have you been in contact with anyone who has recently travelled? no  Have you been in contact with anyone who is experiencing any of the above symptoms or been diagnosed with COVID  or works in or has recently visited a SNF? no  Asked patient to come in fasting for labs.

## 2019-08-13 ENCOUNTER — Ambulatory Visit (INDEPENDENT_AMBULATORY_CARE_PROVIDER_SITE_OTHER): Payer: Self-pay | Admitting: Family Medicine

## 2019-08-13 DIAGNOSIS — M545 Low back pain, unspecified: Secondary | ICD-10-CM

## 2019-08-13 DIAGNOSIS — Z01419 Encounter for gynecological examination (general) (routine) without abnormal findings: Secondary | ICD-10-CM

## 2019-08-13 DIAGNOSIS — Z8742 Personal history of other diseases of the female genital tract: Secondary | ICD-10-CM

## 2019-08-13 DIAGNOSIS — G8929 Other chronic pain: Secondary | ICD-10-CM

## 2019-08-13 DIAGNOSIS — Z13220 Encounter for screening for lipoid disorders: Secondary | ICD-10-CM

## 2019-08-13 MED ORDER — MELOXICAM 7.5 MG PO TABS: 7.5000 mg | ORAL_TABLET | Freq: Every day | ORAL | 5 refills | Status: DC

## 2019-08-13 MED ORDER — METHOCARBAMOL 750 MG PO TABS: 750.0000 mg | ORAL_TABLET | Freq: Four times a day (QID) | ORAL | 5 refills | Status: DC | PRN

## 2019-08-13 NOTE — Progress Notes (Signed)
CPE. Would like to make a fasting lab appointment.  Scheduled to see OBGYN 08/15/2019 for pap.  No concerns right now.

## 2019-08-13 NOTE — Progress Notes (Signed)
Virtual Visit via Telephone Note  I connected with@ on 08/13/19 at  9:10 AM EST by telephone and verified that I am speaking with the correct person using two identifiers.   I discussed the limitations, risks, security and privacy concerns of performing an evaluation and management service by telephone and the availability of in person appointments. I also discussed with the patient that there may be a patient responsible charge related to this service. The patient expressed understanding and agreed to proceed.  Patient Location: Home Provider Location: Home Office Others participating in call: none   History of Present Illness:      38 year old female who is scheduled for annual well exam but unfortunately visit had to be changed to virtual.  She reports that she would still like to be scheduled for fasting lab work.  She does have an appointment with her GYN in follow-up of an abnormal Pap but states that she may have her Pap repeated at this office.  She recalls being told that her Pap smear was abnormal but she does not recall or understand what the actual abnormality was regarding her prior Pap smear.  She denies any current issues with abdominal or pelvic pain.  No vaginal discharge.  She feels that her menses are occurring regularly.  She has found no abnormalities on self breast exam.  She denies any breast pain and no skin changes.  No axillary fullness or enlarged lymph nodes in the axillary area/armpits.         She continues to have issues with chronic low back pain and would like refill of medications that she has taken in the past.  She is not sure of the names of the medications but 1 medication was for pain and the other medication was a muscle relaxant.  She has issues with low back pain-mid back with an out radiation.  Back pain is dull and aching and currently between a 4-6 on a 0-10 pain scale.  She denies any urinary frequency or dysuria.  She has had some occasional acid reflux  symptoms/burping/belching and occasional mild mid upper abdominal burning sensation.  No nausea/vomiting/diarrhea or constipation.  No blood in the stool or dark stools.  No chest pain or palpitations, no shortness of breath or cough.            Past Medical History:  Diagnosis Date  . 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 Ectopic pregnancy   . Elevated glucose   . Herpes 2002  . Infection   . Mixed hyperlipidemia   . Nicotine dependence   . Trichimoniasis 12-2010  . Urinary calculi     Past Surgical History:  Procedure Laterality Date  . DILATION AND CURETTAGE OF UTERUS    . LAPAROSCOPY  11/27/2011   Procedure: LAPAROSCOPY OPERATIVE;  Surgeon: Catalina Antigua, MD;  Location: [facility];  Service: Gynecology;  Laterality: Bilateral;  . SALPINGECTOMY     Bilateral for twin ectopic preg.   . TUBAL LIGATION  12-05-2010    Family History  Problem Relation Age of Onset  . Hypertension Mother   . Diabetes Maternal Grandmother   . Heart disease Maternal Grandmother   . Hypertension Maternal Grandmother   . Diabetes Maternal Grandfather   . Heart disease Maternal Grandfather   . Hypertension Maternal Grandfather   . Hyperlipidemia Daughter   . Breast cancer Maternal Aunt   . Anesthesia problems Neg Hx     Social History   Tobacco Use  .  Smoking status: Never Smoker  . Smokeless tobacco: Never Used  Substance Use Topics  . Alcohol use: Not Currently    Comment: social  . Drug use: No     Allergies  Allergen Reactions  . Hydrocodone Nausea Only    Dizziness    Review of Systems  Constitutional: Negative for chills, fever, malaise/fatigue and weight loss.  HENT: Negative for congestion and sore throat.   Respiratory: Negative for cough and shortness of breath.   Cardiovascular: Negative for chest pain and palpitations.  Gastrointestinal: Positive for abdominal pain and heartburn. Negative for blood in stool, constipation and diarrhea.       Has GI follow-up and on  sucralfate as needed  Genitourinary: Positive for flank pain. Negative for dysuria, frequency and urgency.       History of hematuria and small non-obstructing kidney stones  Musculoskeletal: Positive for back pain and myalgias. Negative for joint pain.  Neurological: Negative for dizziness and headaches.  Psychiatric/Behavioral: Negative for depression. The patient is not nervous/anxious.      Observations/Objective: No vital signs or physical exam conducted as visit was done via telephone  Assessment and Plan: 1. Well woman exam; 4.  History of abnormal Pap smear Patient would like to come in for fasting labs as well as scheduling for Pap/pelvic.  On review of chart, patient did have prior abnormal Pap with atypical squamous cells of undetermined significance-ASCUS.  Discussed ASCUS Pap with the patient.  She can decide if she wants to follow-up with her GYN or schedule an office follow-up for Pap/pelvic. - Comprehensive metabolic panel; Future - Lipid Panel; Future - CBC; Future  2. Chronic midline low back pain without sciatica On review of chart, patient has been on meloxicam and Robaxin in the past.  These medications will be refilled and sent to patient's pharmacy.  She is having some reflux type symptoms, she is encouraged to eat prior to taking the meloxicam.  She should stop the medication and contact the office if acid reflux symptoms worsen. - meloxicam (MOBIC) 7.5 MG tablet; Take 1 tablet (7.5 mg total) by mouth daily. As needed for back pain, take after eating  Dispense: 30 tablet; Refill: 5 - methocarbamol (ROBAXIN) 750 MG tablet; Take 1 tablet (750 mg total) by mouth every 6 (six) hours as needed for muscle spasms.  Dispense: 60 tablet; Refill: 5  3. Screening for lipid disorders Patient will have fasting lab visit for lipid panel as a screening test for lipoid disorders.  She will additionally have CBC in follow-up of her use of nonsteroidal anti-inflammatory type medication  for back pain and comprehensive metabolic panel in follow-up of long-term use of medication as well as to screen for elevated glucose/diabetes. - Lipid Panel; Future  Follow Up Instructions: Schedule fasting labs and appointment for Pap/pelvic exam in 2 to 3 weeks    I discussed the assessment and treatment plan with the patient. The patient was provided an opportunity to ask questions and all were answered. The patient agreed with the plan and demonstrated an understanding of the instructions.   The patient was advised to call back or seek an in-person evaluation if the symptoms worsen or if the condition fails to improve as anticipated.  I provided 13 minutes of non-face-to-face time during this encounter.   Antony Blackbird, MD

## 2019-08-13 NOTE — Patient Instructions (Signed)

## 2019-08-15 ENCOUNTER — Ambulatory Visit: Payer: Self-pay

## 2019-08-26 ENCOUNTER — Encounter (HOSPITAL_COMMUNITY): Payer: Self-pay | Admitting: Emergency Medicine

## 2019-08-26 ENCOUNTER — Ambulatory Visit (HOSPITAL_COMMUNITY)
Admission: EM | Admit: 2019-08-26 | Discharge: 2019-08-26 | Disposition: A | Discharge status: Home / Self Care | Payer: Self-pay | Attending: Family Medicine | Admitting: Family Medicine

## 2019-08-26 ENCOUNTER — Other Ambulatory Visit: Payer: Self-pay

## 2019-08-26 DIAGNOSIS — K5900 Constipation, unspecified: Secondary | ICD-10-CM

## 2019-08-26 MED ORDER — DOCUSATE SODIUM 100 MG PO CAPS: 100.0000 mg | ORAL_CAPSULE | Freq: Two times a day (BID) | ORAL | 0 refills | Status: DC

## 2019-08-26 MED ORDER — FLEET ENEMA 7-19 GM/118ML RE ENEM: 1.0000 | ENEMA | Freq: Once | RECTAL | 0 refills | Status: AC

## 2019-08-26 MED ORDER — BISACODYL 10 MG RE SUPP: 10.0000 mg | RECTAL | 0 refills | Status: DC | PRN

## 2019-08-26 NOTE — ED Triage Notes (Signed)
Pt states she was constipated on Monday and strained really hard. States ever since then shes had tailbone pain. Pt tried taking prep H, states shes used the bathroom twice but she still feels the pressure in her tailbone.

## 2019-08-26 NOTE — Discharge Instructions (Addendum)
Increase water and fiber in your diet.  You may use either a fleet enema or suppository to promote bowel movement.  Please start daily stool softer as well.  If symptoms worsen or do not improve in the next week to return to be seen or to follow up with your PCP.

## 2019-08-26 NOTE — ED Provider Notes (Signed)
[facility]    CSN: [id number] Arrival date & time: 08/26/19  1025      History   Chief Complaint Chief Complaint  Patient presents with  . Tailbone Pain    HPI Kang NEALY HICKMON is a 38 y.o. female.   Gavriella Oliva Bustard presents with complaints of rectal pain for the past 6 days. Started after episode of feeling constipated and unable to fully pass a BM. She feels bloated. She experienced blood on tissue with wiping. She has tried prep h and has increased water intake, no further blood with wiping. Has had some passage of stool. Now when she sits she feels rectal discomfort. No abdominal pain, no pelvic pain. Denies any previous similar.     ROS per HPI, negative if not otherwise mentioned.      Past Medical History:  Diagnosis Date  . Cannabis dependence in remission ([place])   . Ectopic pregnancy   . Elevated glucose   . Herpes 2002  . Infection   . Mixed hyperlipidemia   . Nicotine dependence   . Trichimoniasis 12-2010  . Urinary calculi     Patient Active Problem List   Diagnosis Date Noted  . h/o postpartum BTL  12/13/2011  . S/P ectopic pregnancy 12/13/2011    Past Surgical History:  Procedure Laterality Date  . DILATION AND CURETTAGE OF UTERUS    . LAPAROSCOPY  11/27/2011   Procedure: LAPAROSCOPY OPERATIVE;  Surgeon: Mora Bellman, MD;  Location: [facility];  Service: Gynecology;  Laterality: Bilateral;  . SALPINGECTOMY     Bilateral for twin ectopic preg.   . TUBAL LIGATION  12-05-2010    OB History    Gravida  7   Para  5   Term      Preterm      AB  2   Living  5     SAB      TAB  1   Ectopic  1   Multiple      Live Births  1            Home Medications    Prior to Admission medications   Medication Sig Start Date End Date Taking? Authorizing Provider  bisacodyl (DULCOLAX) 10 MG suppository Place 1 suppository (10 mg total) rectally as needed for moderate constipation. 08/26/19   Zigmund Gottron, NP  docusate sodium  (COLACE) 100 MG capsule Take 1 capsule (100 mg total) by mouth every 12 (twelve) hours. 08/26/19   Zigmund Gottron, NP  meloxicam (MOBIC) 7.5 MG tablet Take 1 tablet (7.5 mg total) by mouth daily. As needed for back pain, take after eating 08/13/19   Fulp, Cammie, MD  methocarbamol (ROBAXIN) 750 MG tablet Take 1 tablet (750 mg total) by mouth every 6 (six) hours as needed for muscle spasms. 08/13/19   Fulp, Cammie, MD  sodium phosphate (FLEET) 7-19 GM/118ML ENEM Place 133 mLs (1 enema total) rectally once for 1 dose. 08/26/19 08/26/19  Zigmund Gottron, NP    Family History Family History  Problem Relation Age of Onset  . Hypertension Mother   . Diabetes Maternal Grandmother   . Heart disease Maternal Grandmother   . Hypertension Maternal Grandmother   . Diabetes Maternal Grandfather   . Heart disease Maternal Grandfather   . Hypertension Maternal Grandfather   . Hyperlipidemia Daughter   . Breast cancer Maternal Aunt   . Anesthesia problems Neg Hx     Social History Social History   Tobacco  Use  . Smoking status: Never Smoker  . Smokeless tobacco: Never Used  Substance Use Topics  . Alcohol use: Not Currently    Comment: social  . Drug use: No     Allergies   Hydrocodone   Review of Systems Review of Systems   Physical Exam Triage Vital Signs ED Triage Vitals  Enc Vitals Group     BP 08/26/19 1104 (!) 106/21     Pulse Rate 08/26/19 1056 79     Resp 08/26/19 1056 16     Temp 08/26/19 1056 98.5 F (36.9 C)     Temp src --      SpO2 08/26/19 1056 98 %     Weight --      Height --      Head Circumference --      Peak Flow --      Pain Score 08/26/19 1059 10     Pain Loc --      Pain Edu? --      Excl. in GC? --    No data found.  Updated Vital Signs BP 119/82 (BP Location: Left Arm)   Pulse 79   Temp 98.5 F (36.9 C)   Resp 16   LMP 08/05/2019   SpO2 98%    Physical Exam Constitutional:      General: She is not in acute distress.     Appearance: She is well-developed.  Cardiovascular:     Rate and Rhythm: Normal rate.  Pulmonary:     Effort: Pulmonary effort is normal.  Genitourinary:    Comments: No visible or palpable abscess presence; no visible external hemorrhoids; no frank bleeding; stool present to rectal vault on digital rectal exam Skin:    General: Skin is warm and dry.  Neurological:     Mental Status: She is alert and oriented to person, place, and time.      UC Treatments / Results  Labs (all labs ordered are listed, but only abnormal results are displayed) Labs Reviewed - No data to display  EKG   Radiology No results found.  Procedures Procedures (including critical care time)  Medications Ordered in UC Medications - No data to display  Initial Impression / Assessment and Plan / UC Course  I have reviewed the triage vital signs and the nursing notes.  Pertinent labs & imaging results that were available during my care of the patient were reviewed by me and considered in my medical decision making (see chart for details).     Exam is overall reassuring. No indication of abscess presence. No external hemorrhoids. Suspect constipation is still source of discomfort. Recommend suppository or fleet enema to aid in passage of stool. Stool softener additionally recommended. Return precautions provided. Patient verbalized understanding and agreeable to plan.   Final Clinical Impressions(s) / UC Diagnoses   Final diagnoses:  Constipation, unspecified constipation type     Discharge Instructions     Increase water and fiber in your diet.  You may use either a fleet enema or suppository to promote bowel movement.  Please start daily stool softer as well.  If symptoms worsen or do not improve in the next week to return to be seen or to follow up with your PCP.      ED Prescriptions    Medication Sig Dispense Auth. Provider   docusate sodium (COLACE) 100 MG capsule Take 1 capsule (100  mg total) by mouth every 12 (twelve) hours. 60 capsule Georgetta HaberBurky, Rahsaan Weakland B, NP  bisacodyl (DULCOLAX) 10 MG suppository Place 1 suppository (10 mg total) rectally as needed for moderate constipation. 12 suppository Demetris Capell B, NP   sodium phosphate (FLEET) 7-19 GM/118ML ENEM Place 133 mLs (1 enema total) rectally once for 1 dose. 1 enema Georgetta Haber, NP     PDMP not reviewed this encounter.   Georgetta Haber, NP 08/26/19 1152

## 2019-08-31 ENCOUNTER — Telehealth: Payer: Self-pay

## 2019-08-31 NOTE — Telephone Encounter (Signed)
Called patient to do their pre-visit COVID screening.  Call went to voicemail. Unable to do prescreening.  

## 2019-09-03 ENCOUNTER — Ambulatory Visit: Payer: Self-pay

## 2019-10-01 ENCOUNTER — Ambulatory Visit: Payer: Self-pay

## 2019-10-19 ENCOUNTER — Telehealth: Payer: Self-pay

## 2019-10-19 NOTE — Telephone Encounter (Signed)

## 2019-10-22 ENCOUNTER — Other Ambulatory Visit: Payer: Self-pay

## 2019-10-22 ENCOUNTER — Ambulatory Visit (INDEPENDENT_AMBULATORY_CARE_PROVIDER_SITE_OTHER): Payer: Self-pay | Admitting: Family Medicine

## 2019-10-22 ENCOUNTER — Other Ambulatory Visit (HOSPITAL_COMMUNITY)
Admission: RE | Admit: 2019-10-22 | Discharge: 2019-10-22 | Disposition: A | Discharge status: Home / Self Care | Payer: Medicaid Other | Source: Ambulatory Visit | Attending: Family Medicine | Admitting: Family Medicine

## 2019-10-22 VITALS — BP 119/76 | HR 75 | Temp 98.2°F | Resp 18 | Ht 59.0 in | Wt 205.0 lb

## 2019-10-22 DIAGNOSIS — Z124 Encounter for screening for malignant neoplasm of cervix: Secondary | ICD-10-CM | POA: Insufficient documentation

## 2019-10-22 DIAGNOSIS — Z113 Encounter for screening for infections with a predominantly sexual mode of transmission: Secondary | ICD-10-CM

## 2019-10-22 DIAGNOSIS — R87610 Atypical squamous cells of undetermined significance on cytologic smear of cervix (ASC-US): Secondary | ICD-10-CM

## 2019-10-22 DIAGNOSIS — Z791 Long term (current) use of non-steroidal anti-inflammatories (NSAID): Secondary | ICD-10-CM

## 2019-10-22 DIAGNOSIS — M2550 Pain in unspecified joint: Secondary | ICD-10-CM

## 2019-10-22 DIAGNOSIS — G8929 Other chronic pain: Secondary | ICD-10-CM

## 2019-10-22 DIAGNOSIS — Z13220 Encounter for screening for lipoid disorders: Secondary | ICD-10-CM

## 2019-10-22 DIAGNOSIS — Z79899 Other long term (current) drug therapy: Secondary | ICD-10-CM

## 2019-10-22 DIAGNOSIS — N898 Other specified noninflammatory disorders of vagina: Secondary | ICD-10-CM

## 2019-10-22 NOTE — Patient Instructions (Signed)

## 2019-10-22 NOTE — Progress Notes (Signed)
Patient presented for PAP Patient would like testing for HIV, Hepatitis, syphilis. Patient complains of rectal pain when sitting for extended time.

## 2019-10-22 NOTE — Progress Notes (Signed)
Established Patient Office Visit  Subjective:  Patient ID: Theresa Mooney, female    DOB: 1981/04/26  Age: 39 y.o. MRN: [id number]  CC:  Chief Complaint  Patient presents with  . Gynecologic Exam    HPI Theresa Mooney, 39 yo female, who presents for pap smear and has history of abnormal ASCUS pap in 2019. She would also like testing for sexually transmitted diseases as well as testing for hepatitis.  She would also like blood work and follow-up of chronic issues including multiple joint pain.  She currently takes OTC pain medicines are prescribed meloxicam as needed.  She wonders if she may possibly have fibromyalgia.  She complains of generalized aching all over.  She reports that about a month ago she was treated at an urgent care in [place] for bacterial vaginitis and urinary tract infection.  She now has some complaint of vaginal itching as well as vaginal discharge and she wonders if she may have BV or yeast infection after antibiotic use.  Last period occurred earlier this month.  Periods occur around the same time on a monthly basis.  No current lower abdominal, pelvic or vaginal pain.  She does have some mid upper abdominal pain for which she is followed by gastroenterology and she needs to make a follow-up appointment.  She is currently on Carafate.  Past Medical History:  Diagnosis Date  . 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 Ectopic pregnancy   . Elevated glucose   . Herpes 2002  . Infection   . Mixed hyperlipidemia   . Nicotine dependence   . Trichimoniasis 12-2010  . Urinary calculi     Past Surgical History:  Procedure Laterality Date  . DILATION AND CURETTAGE OF UTERUS    . LAPAROSCOPY  11/27/2011   Procedure: LAPAROSCOPY OPERATIVE;  Surgeon: Catalina Antigua, MD;  Location: [facility];  Service: Gynecology;  Laterality: Bilateral;  . SALPINGECTOMY     Bilateral for twin ectopic preg.   . TUBAL LIGATION  12-05-2010    Family History  Problem Relation  Age of Onset  . Hypertension Mother   . Diabetes Maternal Grandmother   . Heart disease Maternal Grandmother   . Hypertension Maternal Grandmother   . Diabetes Maternal Grandfather   . Heart disease Maternal Grandfather   . Hypertension Maternal Grandfather   . Hyperlipidemia Daughter   . Breast cancer Maternal Aunt   . Anesthesia problems Neg Hx     Social History   Socioeconomic History  . Marital status: Single    Spouse name: Not on file  . Number of children: Not on file  . Years of education: Not on file  . Highest education level: Not on file  Occupational History  . Not on file  Tobacco Use  . Smoking status: Never Smoker  . Smokeless tobacco: Never Used  Substance and Sexual Activity  . Alcohol use: Not Currently    Comment: social  . Drug use: No  . Sexual activity: Yes    Birth control/protection: Surgical  Other Topics Concern  . Not on file  Social History Narrative  . Not on file   Social Determinants of Health   Financial Resource Strain:   . Difficulty of Paying Living Expenses: Not on file  Food Insecurity:   . Worried About Programme researcher, broadcasting/film/video in the Last Year: Not on file  . Ran Out of Food in the Last Year: Not on file  Transportation Needs:   .  Lack of Transportation (Medical): Not on file  . Lack of Transportation (Non-Medical): Not on file  Physical Activity:   . Days of Exercise per Week: Not on file  . Minutes of Exercise per Session: Not on file  Stress:   . Feeling of Stress : Not on file  Social Connections:   . Frequency of Communication with Friends and Family: Not on file  . Frequency of Social Gatherings with Friends and Family: Not on file  . Attends Religious Services: Not on file  . Active Member of Clubs or Organizations: Not on file  . Attends Banker Meetings: Not on file  . Marital Status: Not on file  Intimate Partner Violence:   . Fear of Current or Ex-Partner: Not on file  . Emotionally Abused: Not on  file  . Physically Abused: Not on file  . Sexually Abused: Not on file    Outpatient Medications Prior to Visit  Medication Sig Dispense Refill  . bisacodyl (DULCOLAX) 10 MG suppository Place 1 suppository (10 mg total) rectally as needed for moderate constipation. 12 suppository 0  . docusate sodium (COLACE) 100 MG capsule Take 1 capsule (100 mg total) by mouth every 12 (twelve) hours. 60 capsule 0  . meloxicam (MOBIC) 7.5 MG tablet Take 1 tablet (7.5 mg total) by mouth daily. As needed for back pain, take after eating 30 tablet 5  . methocarbamol (ROBAXIN) 750 MG tablet Take 1 tablet (750 mg total) by mouth every 6 (six) hours as needed for muscle spasms. 60 tablet 5   No facility-administered medications prior to visit.    Allergies  Allergen Reactions  . Hydrocodone Nausea Only    Dizziness    ROS Review of Systems  Constitutional: Positive for fatigue. Negative for chills and fever.  HENT: Negative for sore throat and trouble swallowing.   Eyes: Negative for photophobia and visual disturbance.  Respiratory: Negative for cough and shortness of breath.   Cardiovascular: Negative for chest pain, palpitations and leg swelling.  Gastrointestinal: Positive for abdominal pain (epigastric abdominal pain; followed by GI). Negative for blood in stool, constipation, diarrhea and nausea.  Endocrine: Negative for polydipsia, polyphagia and polyuria.  Genitourinary: Negative for dysuria and frequency.  Musculoskeletal: Positive for arthralgias and back pain.  Neurological: Negative for dizziness and headaches.  Hematological: Negative for adenopathy. Does not bruise/bleed easily.      Objective:    Physical Exam  Constitutional: She is oriented to person, place, and time. She appears well-developed and well-nourished.  Overweight for height female in no acute distress wearing mask as per office COVID-19 protocol  Neck: No JVD present. No thyromegaly present.  Cardiovascular: Normal  rate.  Pulmonary/Chest: Effort normal and breath sounds normal.  Normal breast exam, no axillary adenopathy, no palpable skin changes, no breast tenderness, no nipple discharge  Abdominal: Soft. There is abdominal tenderness (Epigastric discomfort to palpation, no rebound or guarding). There is no rebound and no guarding.  Genitourinary:    Vaginal discharge present.     Genitourinary Comments: Scant vaginal discharge clear to light yellow, normal appearance to the cervix with the exception of mild change in skin color/texture just above the superior os; no cervical motion tenderness, no adnexal tenderness   Musculoskeletal:        General: No tenderness or edema.     Cervical back: Normal range of motion and neck supple.  Lymphadenopathy:    She has no cervical adenopathy.  Neurological: She is alert and oriented to  person, place, and time.  Skin: Skin is warm and dry.  Psychiatric: She has a normal mood and affect. Her behavior is normal.  Nursing note and vitals reviewed.   BP 119/76 (BP Location: Left Arm, Patient Position: Sitting, Cuff Size: Large)   Pulse 75   Temp 98.2 F (36.8 C) (Oral)   Resp 18   Ht 4\' 11"  (1.499 m)   Wt 205 lb (93 kg)   LMP 10/11/2018   SpO2 98%   BMI 41.40 kg/m  Wt Readings from Last 3 Encounters:  10/22/19 205 lb (93 kg)  10/11/18 188 lb (85.3 kg)  10/07/18 193 lb (87.5 kg)     Health Maintenance Due  Topic Date Due  . INFLUENZA VACCINE  04/28/2019  . PAP SMEAR-Modifier  07/11/2019   Pap smear done at today's visit.  Patient declines influenza immunization at today's visit but will consider at future visit   Lab Results  Component Value Date   TSH 2.554 06/09/2018   Lab Results  Component Value Date   WBC 4.7 09/26/2018   HGB 13.0 09/26/2018   HCT 44.5 09/26/2018   MCV 93.7 09/26/2018   PLT 265 09/26/2018   Lab Results  Component Value Date   NA 137 09/26/2018   K 5.4 (H) 09/26/2018   CO2 23 09/26/2018   GLUCOSE 91 09/26/2018    BUN 12 09/26/2018   CREATININE 0.70 06/13/2019   BILITOT 1.5 (H) 09/26/2018   ALKPHOS 84 09/26/2018   AST 31 09/26/2018   ALT 20 09/26/2018   PROT 7.9 09/26/2018   ALBUMIN 4.2 09/26/2018   CALCIUM 8.9 09/26/2018   ANIONGAP 10 09/26/2018   No results found for: CHOL No results found for: HDL No results found for: LDLCALC No results found for: TRIG No results found for: [facility] Lab Results  Component Value Date   HGBA1C 4.8 08/08/2018      Assessment & Plan:  1. Pap smear for cervical cancer screening Pap smear done at today's visit for cervical cancer screening.  Patient provided educational material on cervical cancer screening as part of after visit summary.  Patient does have history of abnormal, ASCUS Pap in 2019. - Cytology - PAP([place])  2. Atypical squamous cells of undetermined significance (ASCUS) on Papanicolaou smear of cervix Handout provided on cervical cancer screening and discussed ASCUS findings on her last Pap smear.  She will be notified if any further interventions such as colposcopy are needed based on upcoming results from today's Pap - Cytology - PAP([place])  3. Screening for lipid disorders Lipid panel screening for lipid disorder -Lipid panel  4. Vaginal discharge Patient with presence of vaginal discharge on exam and cervicovaginal ancillary testing done for gonorrhea chlamydia, trichomonas, bacterial vaginitis and candidal vaginitis.  Blood testing also done for HIV and syphilis and she will be notified of the results and if any further treatment is needed based on these results - Cervicovaginal ancillary only - HIV antibody (with reflex) - RPR  5. Chronic pain of multiple joints Patient with complaint of chronic pain in multiple joints for which she has had some blood work and work-up in the past.  She will have repeat arthritis panel at today's visit as well as comprehensive metabolic panel, CBC and hepatitis panel she has been  encouraged to schedule follow-up with the new primary care provider at this office in about 4 weeks. - Arthritis Panel - Comprehensive metabolic panel - CBC - HP5  6. Encounter for long-term current  use of medication Comprehensive metabolic panel in follow-up of long-term use of chronic medications and over-the-counter medications - Comprehensive metabolic panel  7. Encounter for long-term (current) use of NSAIDs CBC done for long-term/chronic use of nonsteroidal anti-inflammatories and Cox 2 like anti-inflammatory medication-Mobic - CBC  8. Screening for STD (sexually transmitted disease) Patient requested screening for sexually transmitted diseases and cervicovaginal ancillary testing done for gonorrhea and chlamydia, trichomonas and patient had blood test for HIV, syphilis and hepatitis.  She will be notified of the results and if any further therapy is needed based on the results. - Cervicovaginal ancillary only - HIV antibody (with reflex) - RPR - HP5   An After Visit Summary was printed and given to the patient.   Follow-up: Return for schedule follow-up of joint pain with new [facility] provider.    Cain Saupe, MD

## 2019-10-23 LAB — LIPID PANEL
Chol/HDL Ratio: 2.5 ratio (ref 0.0–4.4)
Cholesterol, Total: 198 mg/dL (ref 100–199)
HDL: 79 mg/dL
LDL Chol Calc (NIH): 105 mg/dL — ABNORMAL HIGH (ref 0–99)
Triglycerides: 79 mg/dL (ref 0–149)
VLDL Cholesterol Cal: 14 mg/dL (ref 5–40)

## 2019-10-23 LAB — CERVICOVAGINAL ANCILLARY ONLY
Bacterial Vaginitis (gardnerella): NEGATIVE
Candida Glabrata: NEGATIVE
Candida Vaginitis: NEGATIVE
Chlamydia: NEGATIVE
Comment: NEGATIVE
Comment: NEGATIVE
Comment: NEGATIVE
Comment: NEGATIVE
Comment: NEGATIVE
Comment: NORMAL
Neisseria Gonorrhea: NEGATIVE
Trichomonas: NEGATIVE

## 2019-10-24 ENCOUNTER — Telehealth: Payer: Self-pay | Admitting: Family Medicine

## 2019-10-24 LAB — CBC
Hematocrit: 41.8 % (ref 34.0–46.6)
Hemoglobin: 13.1 g/dL (ref 11.1–15.9)
MCH: 28.4 pg (ref 26.6–33.0)
MCHC: 31.3 g/dL — ABNORMAL LOW (ref 31.5–35.7)
MCV: 91 fL (ref 79–97)
Platelets: 317 x10E3/uL (ref 150–450)
RBC: 4.62 x10E6/uL (ref 3.77–5.28)
RDW: 12.7 % (ref 11.7–15.4)
WBC: 4.3 x10E3/uL (ref 3.4–10.8)

## 2019-10-24 LAB — ARTHRITIS PANEL
Anti Nuclear Antibody (ANA): NEGATIVE
Rheumatoid fact SerPl-aCnc: <10 [IU]/mL (ref 0.0–13.9)
Sed Rate: 76 mm/h — ABNORMAL HIGH (ref 0–32)
Uric Acid: 4.6 mg/dL (ref 2.6–6.2)

## 2019-10-24 LAB — COMPREHENSIVE METABOLIC PANEL WITH GFR
ALT: 15 IU/L (ref 0–32)
AST: 19 IU/L (ref 0–40)
Albumin/Globulin Ratio: 1.4 (ref 1.2–2.2)
Albumin: 4.5 g/dL (ref 3.8–4.8)
Alkaline Phosphatase: 108 IU/L (ref 39–117)
BUN/Creatinine Ratio: 15 (ref 9–23)
BUN: 13 mg/dL (ref 6–20)
Bilirubin Total: <0.2 mg/dL (ref 0.0–1.2)
CO2: 24 mmol/L (ref 20–29)
Calcium: 9.9 mg/dL (ref 8.7–10.2)
Chloride: 101 mmol/L (ref 96–106)
Creatinine, Ser: 0.88 mg/dL (ref 0.57–1.00)
GFR calc Af Amer: 98 mL/min/1.73
GFR calc non Af Amer: 85 mL/min/1.73
Globulin, Total: 3.2 g/dL (ref 1.5–4.5)
Glucose: 85 mg/dL (ref 65–99)
Potassium: 4.4 mmol/L (ref 3.5–5.2)
Sodium: 137 mmol/L (ref 134–144)
Total Protein: 7.7 g/dL (ref 6.0–8.5)

## 2019-10-24 LAB — HP5
Hep B Core Total Ab: NEGATIVE
Hep B Surface Ab, Qual: NONREACTIVE
Hep C Virus Ab: 0.1 {s_co_ratio} (ref 0.0–0.9)
Hepatitis B Surface Ag: NEGATIVE
hep A Total Ab: NEGATIVE

## 2019-10-24 LAB — CYTOLOGY - PAP
Comment: NEGATIVE
Diagnosis: NEGATIVE
Diagnosis: REACTIVE
High risk HPV: NEGATIVE

## 2019-10-24 LAB — SYPHILIS: RPR W/REFLEX TO RPR TITER AND TREPONEMAL ANTIBODIES, TRADITIONAL SCREENING AND DIAGNOSIS ALGORITHM: RPR Ser Ql: NONREACTIVE

## 2019-10-24 LAB — HIV ANTIBODY (ROUTINE TESTING W REFLEX): HIV Screen 4th Generation wRfx: NONREACTIVE

## 2019-10-24 NOTE — Telephone Encounter (Signed)
Made patient aware of Normal Pap Smear Results as instructed by MD Fulp. Verbalized understanding

## 2019-10-24 NOTE — Telephone Encounter (Signed)
Pt called requesting Pap results to be addressed. Please advice!

## 2019-10-24 NOTE — Telephone Encounter (Signed)
Patient called about her Pap

## 2019-10-24 NOTE — Telephone Encounter (Signed)
Normal pap smear.

## 2019-11-22 ENCOUNTER — Ambulatory Visit: Payer: Medicaid Other | Admitting: Internal Medicine

## 2020-01-09 ENCOUNTER — Ambulatory Visit (INDEPENDENT_AMBULATORY_CARE_PROVIDER_SITE_OTHER): Payer: Medicaid Other | Admitting: Internal Medicine

## 2020-01-09 ENCOUNTER — Encounter: Payer: Self-pay | Admitting: Internal Medicine

## 2020-01-09 DIAGNOSIS — J302 Other seasonal allergic rhinitis: Secondary | ICD-10-CM

## 2020-01-09 DIAGNOSIS — R0602 Shortness of breath: Secondary | ICD-10-CM

## 2020-01-09 MED ORDER — ALBUTEROL SULFATE HFA 108 (90 BASE) MCG/ACT IN AERS: 2.0000 | INHALATION_SPRAY | Freq: Four times a day (QID) | RESPIRATORY_TRACT | 1 refills | Status: DC | PRN

## 2020-01-09 NOTE — Progress Notes (Signed)
Virtual Visit via Telephone Note  I connected with [name], on 01/09/2020 at 1:33 PM by telephone due to the COVID-19 pandemic and verified that I am speaking with the correct person using two identifiers.   Consent: I discussed the limitations, risks, security and privacy concerns of performing an evaluation and management service by telephone and the availability of in person appointments. I also discussed with the patient that there may be a patient responsible charge related to this service. The patient expressed understanding and agreed to proceed.   Location of Patient: Home   Location of Provider: Clinic    Persons participating in Telemedicine visit: Meleana J [facility] Dr. Juleen China    History of Present Illness: Patient has a visit to follow up on ER visit at outside facility in [place]. Patient reports she went there was SOB and couldn't breathe. She had CXR done and blood work. She needed a breathing treatment. She had to go twice. This all started 3/1 and then went back a couple weeks ago for the breathing treatment.   Reports that she was so out of it and by the time she woke up she was back at the house. She isn't really sure what they diagnosed her with. She was prescribed an Albuterol inhaler.   Denies h/o asthma or underlying lung disease. Does suffer from seasonal allergies. Typically gets worse in springtime and summertime. She denies tobacco use.   Reports her breathing has improved. Only gets bad when she is really physically exerting herself. This is an improvement from last month. She can use her Albuterol inhaler and it will help with her breathing. Not taking any medications for allergies.    Past Medical History:  Diagnosis Date  . Cannabis dependence in remission ([place])   . Ectopic pregnancy   . Elevated glucose   . Herpes 2002  . Infection   . Mixed hyperlipidemia   . Nicotine dependence   . Trichimoniasis 12-2010  . Urinary  calculi    Allergies  Allergen Reactions  . Hydrocodone Nausea Only    Dizziness    Current Outpatient Medications on File Prior to Visit  Medication Sig Dispense Refill  . meloxicam (MOBIC) 7.5 MG tablet Take 1 tablet (7.5 mg total) by mouth daily. As needed for back pain, take after eating 30 tablet 5  . methocarbamol (ROBAXIN) 750 MG tablet Take 1 tablet (750 mg total) by mouth every 6 (six) hours as needed for muscle spasms. 60 tablet 5   No current facility-administered medications on file prior to visit.    Observations/Objective: NAD. Speaking clearly.  Work of breathing normal.  Alert and oriented. Mood appropriate.   Assessment and Plan: 1. Shortness of breath Suspect related to seasonal allergies or to a viral illness. Continue Albuterol prn. Has no known underlying lung abnormalities and does not smoke tobacco products. Stable from respiratory standpoint at present and symptoms well managed with occasional albuterol use. Discussed with patient reasons to seek emergency medical care. Also discussed that if she finds herself needing to use Albuterol frequently we would need to send for PFTs.  - albuterol (VENTOLIN HFA) 108 (90 Base) MCG/ACT inhaler; Inhale 2 puffs into the lungs every 6 (six) hours as needed for wheezing or shortness of breath.  Dispense: 18 g; Refill: 1  2. Seasonal allergic rhinitis, unspecified trigger Advised to use OTC oral antihistamine in an attempt to control her allergic symptoms.    Follow Up Instructions: PRN and for routine medical  care    I discussed the assessment and treatment plan with the patient. The patient was provided an opportunity to ask questions and all were answered. The patient agreed with the plan and demonstrated an understanding of the instructions.   The patient was advised to call back or seek an in-person evaluation if the symptoms worsen or if the condition fails to improve as anticipated.     I provided 12 minutes  total of non-face-to-face time during this encounter including median intraservice time, reviewing previous notes, investigations, ordering medications, medical decision making, coordinating care and patient verbalized understanding at the end of the visit.    Marcy Siren, D.O. Primary Care at [facility]  01/09/2020, 1:33 PM

## 2020-01-24 ENCOUNTER — Telehealth: Payer: Self-pay | Admitting: Family Medicine

## 2020-01-24 NOTE — Telephone Encounter (Signed)
Called pt she stated she would go to [facility]

## 2020-01-24 NOTE — Telephone Encounter (Signed)
Pt called asking for flagyl stated that she has BV and she talked with the dr about it in a televisit and she needs it sent to [facility] [place] on [address]

## 2020-01-24 NOTE — Telephone Encounter (Signed)
Patient has not discussed this with a provider at this office since October 2020. She will need an appointment to discuss or she can go to urgent care for evaluation.

## 2020-03-06 ENCOUNTER — Encounter (HOSPITAL_COMMUNITY): Payer: Self-pay

## 2020-03-06 ENCOUNTER — Other Ambulatory Visit: Payer: Self-pay

## 2020-03-06 ENCOUNTER — Ambulatory Visit (HOSPITAL_COMMUNITY)
Admission: EM | Admit: 2020-03-06 | Discharge: 2020-03-06 | Disposition: A | Discharge status: Home / Self Care | Payer: Medicaid Other

## 2020-03-06 DIAGNOSIS — R102 Pelvic and perineal pain: Secondary | ICD-10-CM | POA: Insufficient documentation

## 2020-03-06 DIAGNOSIS — B9689 Other specified bacterial agents as the cause of diseases classified elsewhere: Secondary | ICD-10-CM

## 2020-03-06 DIAGNOSIS — N898 Other specified noninflammatory disorders of vagina: Secondary | ICD-10-CM | POA: Insufficient documentation

## 2020-03-06 DIAGNOSIS — N760 Acute vaginitis: Secondary | ICD-10-CM | POA: Insufficient documentation

## 2020-03-06 MED ORDER — CLINDAMYCIN HCL 300 MG PO CAPS: 300.0000 mg | ORAL_CAPSULE | Freq: Two times a day (BID) | ORAL | 0 refills | Status: DC

## 2020-03-06 MED ORDER — NAPROXEN 500 MG PO TABS: 500.0000 mg | ORAL_TABLET | Freq: Two times a day (BID) | ORAL | 0 refills | Status: DC

## 2020-03-06 NOTE — ED Triage Notes (Signed)
Pt is here with Vaginal discharge states she has a hx of BV with left flank pain that started 2 weeks ago. Pt has not taken any meds to relieve discomfort.

## 2020-03-06 NOTE — ED Provider Notes (Signed)
[place]   MRN: [id number] DOB: 1981/08/02  Subjective:   Theresa Mooney is a 39 y.o. female presenting for 2-week history of persistent vaginal discharge with bad odor, pelvic pains.  Patient has a history of BV and has had very similar symptoms before.  Has taken Flagyl for this and it has cleared up.  Denies fever, nausea, vomiting dysuria, hematuria.  Patient does admit that sometimes she uses different soaps and this can affect her.  No current facility-administered medications for this encounter.  Current Outpatient Medications:  .  albuterol (VENTOLIN HFA) 108 (90 Base) MCG/ACT inhaler, Inhale 1-2 puffs into the lungs every 6 (six) hours as needed for wheezing or shortness of breath., Disp: , Rfl:  .  albuterol (VENTOLIN HFA) 108 (90 Base) MCG/ACT inhaler, Inhale 2 puffs into the lungs every 6 (six) hours as needed for wheezing or shortness of breath., Disp: 18 g, Rfl: 1 .  meloxicam (MOBIC) 7.5 MG tablet, Take 1 tablet (7.5 mg total) by mouth daily. As needed for back pain, take after eating, Disp: 30 tablet, Rfl: 5 .  methocarbamol (ROBAXIN) 750 MG tablet, Take 1 tablet (750 mg total) by mouth every 6 (six) hours as needed for muscle spasms., Disp: 60 tablet, Rfl: 5 .  metroNIDAZOLE (FLAGYL) 500 MG tablet, SMARTSIG:1 Tablet(s) By Mouth Every 12 Hours, Disp: , Rfl:    Allergies  Allergen Reactions  . Hydrocodone Nausea Only    Dizziness    Past Medical History:  Diagnosis Date  . Cannabis dependence in remission ([facility])   . Ectopic pregnancy   . Elevated glucose   . Herpes 2002  . Infection   . Mixed hyperlipidemia   . Nicotine dependence   . Trichimoniasis 12-2010  . Urinary calculi      Past Surgical History:  Procedure Laterality Date  . DILATION AND CURETTAGE OF UTERUS    . LAPAROSCOPY  11/27/2011   Procedure: LAPAROSCOPY OPERATIVE;  Surgeon: Mora Bellman, MD;  Location: [facility];  Service: Gynecology;  Laterality: Bilateral;  . SALPINGECTOMY      Bilateral for twin ectopic preg.   . TUBAL LIGATION  12-05-2010    Family History  Problem Relation Age of Onset  . Hypertension Mother   . Diabetes Maternal Grandmother   . Heart disease Maternal Grandmother   . Hypertension Maternal Grandmother   . Diabetes Maternal Grandfather   . Heart disease Maternal Grandfather   . Hypertension Maternal Grandfather   . Hyperlipidemia Daughter   . Breast cancer Maternal Aunt   . Anesthesia problems Neg Hx     Social History   Tobacco Use  . Smoking status: Never Smoker  . Smokeless tobacco: Never Used  Vaping Use  . Vaping Use: Never used  Substance Use Topics  . Alcohol use: Not Currently    Comment: social  . Drug use: No    ROS   Objective:   Vitals: BP 112/75 (BP Location: Right Arm)   Pulse 77   Temp 99.3 F (37.4 C) (Oral)   Resp 19   Wt 203 lb (92.1 kg)   LMP 02/18/2020   SpO2 100%   BMI 41.00 kg/m   Physical Exam Constitutional:      General: She is not in acute distress.    Appearance: Normal appearance. She is well-developed. She is obese. She is not ill-appearing, toxic-appearing or diaphoretic.  HENT:     Head: Normocephalic and atraumatic.     Nose: Nose normal.  Mouth/Throat:     Mouth: Mucous membranes are moist.     Pharynx: Oropharynx is clear.  Eyes:     General: No scleral icterus.       Right eye: No discharge.        Left eye: No discharge.     Extraocular Movements: Extraocular movements intact.     Conjunctiva/sclera: Conjunctivae normal.     Pupils: Pupils are equal, round, and reactive to light.  Cardiovascular:     Rate and Rhythm: Normal rate.  Pulmonary:     Effort: Pulmonary effort is normal.  Abdominal:     General: Bowel sounds are normal. There is no distension.     Palpations: Abdomen is soft. There is no mass.     Tenderness: There is abdominal tenderness (mild over mid-left pelvic side). There is no right CVA tenderness, left CVA tenderness, guarding or rebound.    Skin:    General: Skin is warm and dry.  Neurological:     General: No focal deficit present.     Mental Status: She is alert and oriented to person, place, and time.  Psychiatric:        Mood and Affect: Mood normal.        Behavior: Behavior normal.        Thought Content: Thought content normal.        Judgment: Judgment normal.     Assessment and Plan :   PDMP not reviewed this encounter.  1. Vaginal discharge   2. BV (bacterial vaginosis)   3. Pelvic pain     Start clindamycin to cover for Flagyl, labs pending.  Recommended Naprosyn for pain and inflammation.  Recommended patient try 1 soap consistently for regular hygiene. Counseled patient on potential for adverse effects with medications prescribed/recommended today, ER and return-to-clinic precautions discussed, patient verbalized understanding.    Wallis Bamberg, PA-C 03/06/20 1301

## 2020-03-07 ENCOUNTER — Telehealth (HOSPITAL_COMMUNITY): Payer: Self-pay | Admitting: Orthopedic Surgery

## 2020-03-07 LAB — CERVICOVAGINAL ANCILLARY ONLY
Bacterial Vaginitis (gardnerella): POSITIVE — AB
Candida Glabrata: NEGATIVE
Candida Vaginitis: NEGATIVE
Comment: NEGATIVE
Comment: NEGATIVE
Comment: NEGATIVE

## 2020-04-27 ENCOUNTER — Encounter: Payer: Self-pay | Admitting: Emergency Medicine

## 2020-04-27 ENCOUNTER — Ambulatory Visit
Admission: EM | Admit: 2020-04-27 | Discharge: 2020-04-27 | Disposition: A | Discharge status: Home / Self Care | Payer: Medicaid Other | Attending: Emergency Medicine | Admitting: Emergency Medicine

## 2020-04-27 ENCOUNTER — Other Ambulatory Visit: Payer: Self-pay

## 2020-04-27 DIAGNOSIS — R311 Benign essential microscopic hematuria: Secondary | ICD-10-CM

## 2020-04-27 DIAGNOSIS — N898 Other specified noninflammatory disorders of vagina: Secondary | ICD-10-CM | POA: Insufficient documentation

## 2020-04-27 DIAGNOSIS — R109 Unspecified abdominal pain: Secondary | ICD-10-CM | POA: Insufficient documentation

## 2020-04-27 LAB — POCT URINALYSIS DIP (MANUAL ENTRY)
Bilirubin, UA: NEGATIVE
Glucose, UA: NEGATIVE mg/dL
Ketones, POC UA: NEGATIVE mg/dL
Leukocytes, UA: NEGATIVE
Nitrite, UA: NEGATIVE
Protein Ur, POC: NEGATIVE mg/dL
Spec Grav, UA: >=1.03 — AB (ref 1.010–1.025)
Urobilinogen, UA: 0.2 E.U./dL
pH, UA: 6 (ref 5.0–8.0)

## 2020-04-27 LAB — POCT URINE PREGNANCY: Preg Test, Ur: NEGATIVE

## 2020-04-27 NOTE — ED Triage Notes (Signed)
Pt presents to [facility] for assessment of bilateral lower abdominal pain x 1 week, and vaginal discharge since her visit to [facility], states she did not finish all of her meds/take them appropriately.

## 2020-04-27 NOTE — ED Provider Notes (Signed)
[name]    CSN: [id number] Arrival date & time: 04/27/20  1006      History   Chief Complaint Chief Complaint  Patient presents with  . Abdominal Pain    HPI Theresa Mooney is a 39 y.o. female presenting for STD testing.  Patient states she was previously evaluated in UC setting 6/10: Please see records which are reviewed by me-given clindamycin for BV which patient has a history thereof.  No STI testing at that time.  Patient states vaginal discharge has improved, still mild.  Does admit to lifestyle modifications that typically relieve her symptoms, has not done these.  Denies pelvic pain, severe abdominal pain, nausea, vomiting.   Past Medical History:  Diagnosis Date  . 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 Ectopic pregnancy   . Elevated glucose   . Herpes 2002  . Infection   . Mixed hyperlipidemia   . Nicotine dependence   . Trichimoniasis 12-2010  . Urinary calculi     Patient Active Problem List   Diagnosis Date Noted  . h/o postpartum BTL  12/13/2011  . S/P ectopic pregnancy 12/13/2011    Past Surgical History:  Procedure Laterality Date  . DILATION AND CURETTAGE OF UTERUS    . LAPAROSCOPY  11/27/2011   Procedure: LAPAROSCOPY OPERATIVE;  Surgeon: Catalina Antigua, MD;  Location: [facility];  Service: Gynecology;  Laterality: Bilateral;  . SALPINGECTOMY     Bilateral for twin ectopic preg.   . TUBAL LIGATION  12-05-2010    OB History    Gravida  7   Para  5   Term      Preterm      AB  2   Living  5     SAB      TAB  1   Ectopic  1   Multiple      Live Births  1            Home Medications    Prior to Admission medications   Medication Sig Start Date End Date Taking? Authorizing Provider  albuterol (VENTOLIN HFA) 108 (90 Base) MCG/ACT inhaler Inhale 1-2 puffs into the lungs every 6 (six) hours as needed for wheezing or shortness of breath.    [provider]  albuterol (VENTOLIN HFA) 108 (90 Base) MCG/ACT  inhaler Inhale 2 puffs into the lungs every 6 (six) hours as needed for wheezing or shortness of breath. 01/09/20   Arvilla Market, DO  naproxen (NAPROSYN) 500 MG tablet Take 1 tablet (500 mg total) by mouth 2 (two) times daily with a meal. 03/06/20   Wallis Bamberg, PA-C    Family History Family History  Problem Relation Age of Onset  . Hypertension Mother   . Diabetes Maternal Grandmother   . Heart disease Maternal Grandmother   . Hypertension Maternal Grandmother   . Diabetes Maternal Grandfather   . Heart disease Maternal Grandfather   . Hypertension Maternal Grandfather   . Hyperlipidemia Daughter   . Breast cancer Maternal Aunt   . Anesthesia problems Neg Hx     Social History Social History   Tobacco Use  . Smoking status: Never Smoker  . Smokeless tobacco: Never Used  Vaping Use  . Vaping Use: Never used  Substance Use Topics  . Alcohol use: Not Currently    Comment: social  . Drug use: No     Allergies   Hydrocodone   Review of Systems As per HPI   Physical  Exam Triage Vital Signs ED Triage Vitals [04/27/20 1053]  Enc Vitals Group     BP 127/81     Pulse Rate 72     Resp 18     Temp (!) 97.2 F (36.2 C)     Temp Source Temporal     SpO2 98 %     Weight      Height      Head Circumference      Peak Flow      Pain Score 6     Pain Loc      Pain Edu?      Excl. in GC?    No data found.  Updated Vital Signs BP 127/81 (BP Location: Left Arm)   Pulse 72   Temp (!) 97.2 F (36.2 C) (Temporal)   Resp 18   LMP 04/20/2020   SpO2 98%   Visual Acuity Right Eye Distance:   Left Eye Distance:   Bilateral Distance:    Right Eye Near:   Left Eye Near:    Bilateral Near:     Physical Exam Constitutional:      General: She is not in acute distress. HENT:     Head: Normocephalic and atraumatic.  Eyes:     General: No scleral icterus.    Pupils: Pupils are equal, round, and reactive to light.  Cardiovascular:     Rate and Rhythm:  Normal rate.  Pulmonary:     Effort: Pulmonary effort is normal.  Abdominal:     General: Bowel sounds are normal.     Palpations: Abdomen is soft.     Tenderness: There is no abdominal tenderness. There is no right CVA tenderness, left CVA tenderness or guarding.  Genitourinary:    Comments: Patient declined, self-swab performed Skin:    Coloration: Skin is not jaundiced or pale.  Neurological:     Mental Status: She is alert and oriented to person, place, and time.      UC Treatments / Results  Labs (all labs ordered are listed, but only abnormal results are displayed) Labs Reviewed  POCT URINALYSIS DIP (MANUAL ENTRY) - Abnormal; Notable for the following components:      Result Value   Spec Grav, UA >=1.030 (*)    Blood, UA moderate (*)    All other components within normal limits  POCT URINE PREGNANCY - Normal  CERVICOVAGINAL ANCILLARY ONLY    EKG   Radiology No results found.  Procedures Procedures (including critical care time)  Medications Ordered in UC Medications - No data to display  Initial Impression / Assessment and Plan / UC Course  I have reviewed the triage vital signs and the nursing notes.  Pertinent labs & imaging results that were available during my care of the patient were reviewed by me and considered in my medical decision making (see chart for details).     Urine with moderate blood, elevated specific gravity: Patient reports chronic history of hematuria that had negative work-up with specialist.  Cytology pending: We will treat if indicated.  Return precautions discussed, pt verbalized understanding and is agreeable to plan. Final Clinical Impressions(s) / UC Diagnoses   Final diagnoses:  Benign essential microscopic hematuria  Abdominal cramping  Vaginal discharge     Discharge Instructions     Testing for chlamydia, gonorrhea, trichomonas is pending: please look for these results on the MyChart app/website.  We will notify you  if you are positive and outline treatment at that time.  Important  to avoid all forms of sexual intercourse (oral, vaginal, anal) with any/all partners for the next 7 days to avoid spreading/reinfecting. Any/all sexual partners should be notified of testing/treatment today.  Return for persistent/worsening symptoms or if you develop fever, abdominal or pelvic pain, discharge, genital pain, blood in your urine, or are re-exposed to an STI.    ED Prescriptions    None     PDMP not reviewed this encounter.   Hall[place], [place], [place] 04/27/20 1214

## 2020-04-27 NOTE — Discharge Instructions (Signed)
Testing for chlamydia, gonorrhea, trichomonas is pending: please look for these results on the MyChart app/website.  We will notify you if you are positive and outline treatment at that time.  Important to avoid all forms of sexual intercourse (oral, vaginal, anal) with any/all partners for the next 7 days to avoid spreading/reinfecting. Any/all sexual partners should be notified of testing/treatment today.  Return for persistent/worsening symptoms or if you develop fever, abdominal or pelvic pain, discharge, genital pain, blood in your urine, or are re-exposed to an STI. 

## 2020-04-29 ENCOUNTER — Other Ambulatory Visit: Payer: Self-pay

## 2020-04-29 ENCOUNTER — Emergency Department (HOSPITAL_COMMUNITY)
Admission: EM | Admit: 2020-04-29 | Discharge: 2020-04-29 | Disposition: A | Discharge status: Home / Self Care | Payer: Medicaid Other | Attending: Emergency Medicine | Admitting: Emergency Medicine

## 2020-04-29 ENCOUNTER — Encounter (HOSPITAL_COMMUNITY): Payer: Self-pay

## 2020-04-29 DIAGNOSIS — M549 Dorsalgia, unspecified: Secondary | ICD-10-CM | POA: Insufficient documentation

## 2020-04-29 DIAGNOSIS — Z5321 Procedure and treatment not carried out due to patient leaving prior to being seen by health care provider: Secondary | ICD-10-CM | POA: Insufficient documentation

## 2020-04-29 DIAGNOSIS — R350 Frequency of micturition: Secondary | ICD-10-CM | POA: Insufficient documentation

## 2020-04-29 DIAGNOSIS — R109 Unspecified abdominal pain: Secondary | ICD-10-CM | POA: Insufficient documentation

## 2020-04-29 DIAGNOSIS — R319 Hematuria, unspecified: Secondary | ICD-10-CM | POA: Insufficient documentation

## 2020-04-29 LAB — CERVICOVAGINAL ANCILLARY ONLY
Chlamydia: NEGATIVE
Comment: NEGATIVE
Comment: NEGATIVE
Comment: NORMAL
Neisseria Gonorrhea: NEGATIVE
Trichomonas: NEGATIVE

## 2020-04-29 NOTE — ED Triage Notes (Signed)
Patient c/o intermittent bilateral flank pain that radiates into the back x 5 days. Patient states she was having urinary frequency, but not now and   hematuria.(patient states she has had x 2 years). Patient does report a history of kidney stones.

## 2020-04-29 NOTE — ED Notes (Signed)
Unsuccessful blood-work stick

## 2020-04-30 DIAGNOSIS — R109 Unspecified abdominal pain: Secondary | ICD-10-CM | POA: Insufficient documentation

## 2020-04-30 LAB — CBC
HCT: 37.9 % (ref 36.0–46.0)
Hemoglobin: 11.6 g/dL — ABNORMAL LOW (ref 12.0–15.0)
MCH: 27.7 pg (ref 26.0–34.0)
MCHC: 30.6 g/dL (ref 30.0–36.0)
MCV: 90.5 fL (ref 80.0–100.0)
Platelets: 306 K/uL (ref 150–400)
RBC: 4.19 MIL/uL (ref 3.87–5.11)
RDW: 13.4 % (ref 11.5–15.5)
WBC: 6.2 K/uL (ref 4.0–10.5)
nRBC: 0 % (ref 0.0–0.2)

## 2020-04-30 LAB — URINALYSIS, ROUTINE W REFLEX MICROSCOPIC
Bilirubin Urine: NEGATIVE
Glucose, UA: NEGATIVE mg/dL
Ketones, ur: NEGATIVE mg/dL
Leukocytes,Ua: NEGATIVE
Nitrite: NEGATIVE
Protein, ur: NEGATIVE mg/dL
Specific Gravity, Urine: 1.019 (ref 1.005–1.030)
pH: 6 (ref 5.0–8.0)

## 2020-04-30 LAB — COMPREHENSIVE METABOLIC PANEL WITH GFR
ALT: 13 U/L (ref 0–44)
AST: 15 U/L (ref 15–41)
Albumin: 4 g/dL (ref 3.5–5.0)
Alkaline Phosphatase: 75 U/L (ref 38–126)
Anion gap: 6 (ref 5–15)
BUN: 16 mg/dL (ref 6–20)
CO2: 29 mmol/L (ref 22–32)
Calcium: 8.8 mg/dL — ABNORMAL LOW (ref 8.9–10.3)
Chloride: 102 mmol/L (ref 98–111)
Creatinine, Ser: 0.91 mg/dL (ref 0.44–1.00)
GFR calc Af Amer: >60 mL/min
GFR calc non Af Amer: >60 mL/min
Glucose, Bld: 91 mg/dL (ref 70–99)
Potassium: 4 mmol/L (ref 3.5–5.1)
Sodium: 137 mmol/L (ref 135–145)
Total Bilirubin: 0.5 mg/dL (ref 0.3–1.2)
Total Protein: 7.4 g/dL (ref 6.5–8.1)

## 2020-04-30 LAB — LIPASE, BLOOD: Lipase: 30 U/L (ref 11–51)

## 2020-04-30 MED ORDER — SODIUM CHLORIDE 0.9% FLUSH: 3.0000 mL | Freq: Once | INTRAVENOUS | Status: DC

## 2020-05-01 ENCOUNTER — Encounter (HOSPITAL_COMMUNITY): Payer: Self-pay | Admitting: Emergency Medicine

## 2020-05-01 ENCOUNTER — Other Ambulatory Visit: Payer: Self-pay

## 2020-05-01 ENCOUNTER — Emergency Department (HOSPITAL_COMMUNITY): Payer: Medicaid Other

## 2020-05-01 ENCOUNTER — Emergency Department (HOSPITAL_COMMUNITY)
Admission: EM | Admit: 2020-05-01 | Discharge: 2020-05-01 | Disposition: A | Discharge status: Home / Self Care | Payer: Medicaid Other | Attending: Emergency Medicine | Admitting: Emergency Medicine

## 2020-05-01 DIAGNOSIS — R109 Unspecified abdominal pain: Secondary | ICD-10-CM

## 2020-05-01 LAB — HCG, QUANTITATIVE, PREGNANCY: hCG, Beta Chain, Quant, S: <1 m[IU]/mL (ref <5)

## 2020-05-01 MED ORDER — DICLOFENAC SODIUM ER 100 MG PO TB24: 100.0000 mg | ORAL_TABLET | Freq: Every day | ORAL | 0 refills | Status: DC

## 2020-05-01 MED ORDER — KETOROLAC TROMETHAMINE 60 MG/2ML IM SOLN
60.0000 mg | Freq: Once | INTRAMUSCULAR | Status: AC
  Administered 2020-05-01: 60 mg via INTRAMUSCULAR
  Filled 2020-05-01: qty 2

## 2020-05-01 MED ORDER — LIDOCAINE 5 % EX PTCH: 1.0000 | MEDICATED_PATCH | CUTANEOUS | 0 refills | Status: DC

## 2020-05-01 NOTE — ED Provider Notes (Signed)
[facility]-EMERGENCY DEPT Provider Note   CSN: [id number] Arrival date & time: 04/30/20  2254     History Chief Complaint  Patient presents with  . Flank Pain    Theresa Mooney is a 39 y.o. female.  The history is provided by the patient.  Flank Pain This is a recurrent problem. The current episode started more than 2 days ago (5 days ). The problem occurs constantly. The problem has not changed since onset.Pertinent negatives include no chest pain, no abdominal pain, no headaches and no shortness of breath. Nothing aggravates the symptoms. Nothing relieves the symptoms. She has tried nothing for the symptoms. The treatment provided no relief.  Patient with B flanks pain without associated symptoms x 5 days.  States she left her pain medicine in [place].      Past Medical History:  Diagnosis Date  . 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 Ectopic pregnancy   . Elevated glucose   . Herpes 2002  . Infection   . Mixed hyperlipidemia   . Nicotine dependence   . Trichimoniasis 12-2010  . Urinary calculi     Patient Active Problem List   Diagnosis Date Noted  . h/o postpartum BTL  12/13/2011  . S/P ectopic pregnancy 12/13/2011    Past Surgical History:  Procedure Laterality Date  . DILATION AND CURETTAGE OF UTERUS    . LAPAROSCOPY  11/27/2011   Procedure: LAPAROSCOPY OPERATIVE;  Surgeon: Catalina Antigua, MD;  Location: [facility];  Service: Gynecology;  Laterality: Bilateral;  . SALPINGECTOMY     Bilateral for twin ectopic preg.   . TUBAL LIGATION  12-05-2010     OB History    Gravida  7   Para  5   Term      Preterm      AB  2   Living  5     SAB      TAB  1   Ectopic  1   Multiple      Live Births  1           Family History  Problem Relation Age of Onset  . Hypertension Mother   . Diabetes Maternal Grandmother   . Heart disease Maternal Grandmother   . Hypertension Maternal Grandmother   . Diabetes Maternal  Grandfather   . Heart disease Maternal Grandfather   . Hypertension Maternal Grandfather   . Hyperlipidemia Daughter   . Breast cancer Maternal Aunt   . Anesthesia problems Neg Hx     Social History   Tobacco Use  . Smoking status: Never Smoker  . Smokeless tobacco: Never Used  Vaping Use  . Vaping Use: Never used  Substance Use Topics  . Alcohol use: Not Currently    Comment: social  . Drug use: No    Home Medications Prior to Admission medications   Medication Sig Start Date End Date Taking? Authorizing Provider  albuterol (VENTOLIN HFA) 108 (90 Base) MCG/ACT inhaler Inhale 1-2 puffs into the lungs every 6 (six) hours as needed for wheezing or shortness of breath.    [provider]  albuterol (VENTOLIN HFA) 108 (90 Base) MCG/ACT inhaler Inhale 2 puffs into the lungs every 6 (six) hours as needed for wheezing or shortness of breath. 01/09/20   Arvilla Market, DO  Diclofenac Sodium CR 100 MG 24 hr tablet Take 1 tablet (100 mg total) by mouth daily. 05/01/20   Fidencia Mccloud, MD  lidocaine (LIDODERM) 5 % Place  1 patch onto the skin daily. Remove & Discard patch within 12 hours or as directed by MD 05/01/20   Nicanor AlconPalumbo, Jemarion Roycroft, MD  naproxen (NAPROSYN) 500 MG tablet Take 1 tablet (500 mg total) by mouth 2 (two) times daily with a meal. 03/06/20   Wallis BambergMani, Mario, PA-C    Allergies    Hydrocodone  Review of Systems   Review of Systems  Constitutional: Negative for fever.  HENT: Negative for congestion.   Eyes: Negative for visual disturbance.  Respiratory: Negative for shortness of breath.   Cardiovascular: Negative for chest pain.  Gastrointestinal: Negative for abdominal pain.  Genitourinary: Positive for flank pain.  Musculoskeletal: Negative for arthralgias.  Skin: Negative for rash.  Neurological: Negative for headaches.  Psychiatric/Behavioral: Negative for agitation.  All other systems reviewed and are negative.   Physical Exam Updated Vital Signs BP  124/77 (BP Location: Right Arm)   Pulse 63   Temp 97.7 F (36.5 C) (Oral)   Resp 16   Ht 4\' 11"  (1.499 m)   Wt 92.1 kg   LMP 04/20/2020 Comment: negative beta HCG 04/30/20  SpO2 100%   BMI 41.00 kg/m   Physical Exam Vitals and nursing note reviewed.  Constitutional:      General: She is not in acute distress.    Appearance: Normal appearance.  HENT:     Head: Normocephalic and atraumatic.     Nose: Nose normal.  Eyes:     Conjunctiva/sclera: Conjunctivae normal.     Pupils: Pupils are equal, round, and reactive to light.  Cardiovascular:     Rate and Rhythm: Normal rate and regular rhythm.     Pulses: Normal pulses.     Heart sounds: Normal heart sounds.  Pulmonary:     Effort: Pulmonary effort is normal.     Breath sounds: Normal breath sounds.  Abdominal:     General: Abdomen is flat. Bowel sounds are normal.     Palpations: Abdomen is soft.     Tenderness: There is no abdominal tenderness. There is no guarding or rebound.  Musculoskeletal:        General: Normal range of motion.     Cervical back: Normal range of motion and neck supple.  Skin:    General: Skin is warm.     Capillary Refill: Capillary refill takes less than 2 seconds.  Neurological:     General: No focal deficit present.     Mental Status: She is alert and oriented to person, place, and time.     Deep Tendon Reflexes: Reflexes normal.  Psychiatric:        Mood and Affect: Mood normal.        Behavior: Behavior normal.     ED Results / Procedures / Treatments   Labs (all labs ordered are listed, but only abnormal results are displayed) Results for orders placed or performed during the hospital encounter of 05/01/20  Lipase, blood  Result Value Ref Range   Lipase 30 11 - 51 U/L  Comprehensive metabolic panel  Result Value Ref Range   Sodium 137 135 - 145 mmol/L   Potassium 4.0 3.5 - 5.1 mmol/L   Chloride 102 98 - 111 mmol/L   CO2 29 22 - 32 mmol/L   Glucose, Bld 91 70 - 99 mg/dL   BUN 16  6 - 20 mg/dL   Creatinine, Ser 1.610.91 0.44 - 1.00 mg/dL   Calcium 8.8 (L) 8.9 - 10.3 mg/dL   Total Protein 7.4 6.5 - 8.1  g/dL   Albumin 4.0 3.5 - 5.0 g/dL   AST 15 15 - 41 U/L   ALT 13 0 - 44 U/L   Alkaline Phosphatase 75 38 - 126 U/L   Total Bilirubin 0.5 0.3 - 1.2 mg/dL   GFR calc non Af Amer >60 >60 mL/min   GFR calc Af Amer >60 >60 mL/min   Anion gap 6 5 - 15  CBC  Result Value Ref Range   WBC 6.2 4.0 - 10.5 K/uL   RBC 4.19 3.87 - 5.11 MIL/uL   Hemoglobin 11.6 (L) 12.0 - 15.0 g/dL   HCT 25.8 36 - 46 %   MCV 90.5 80.0 - 100.0 fL   MCH 27.7 26.0 - 34.0 pg   MCHC 30.6 30.0 - 36.0 g/dL   RDW 52.7 78.2 - 42.3 %   Platelets 306 150 - 400 K/uL   nRBC 0.0 0.0 - 0.2 %  Urinalysis, Routine w reflex microscopic  Result Value Ref Range   Color, Urine YELLOW YELLOW   APPearance CLEAR CLEAR   Specific Gravity, Urine 1.019 1.005 - 1.030   pH 6.0 5.0 - 8.0   Glucose, UA NEGATIVE NEGATIVE mg/dL   Hgb urine dipstick MODERATE (A) NEGATIVE   Bilirubin Urine NEGATIVE NEGATIVE   Ketones, ur NEGATIVE NEGATIVE mg/dL   Protein, ur NEGATIVE NEGATIVE mg/dL   Nitrite NEGATIVE NEGATIVE   Leukocytes,Ua NEGATIVE NEGATIVE   RBC / HPF 6-10 0 - 5 RBC/hpf   WBC, UA 0-5 0 - 5 WBC/hpf   Bacteria, UA RARE (A) NONE SEEN   Squamous Epithelial / LPF 11-20 0 - 5   Mucus PRESENT   hCG, quantitative, pregnancy  Result Value Ref Range   hCG, Beta Chain, Quant, S <1 <5 mIU/mL   CT Renal Stone Study  Result Date: 05/01/2020 CLINICAL DATA:  Bilateral flank pain EXAM: CT ABDOMEN AND PELVIS WITHOUT CONTRAST TECHNIQUE: Multidetector CT imaging of the abdomen and pelvis was performed following the standard protocol without IV contrast. COMPARISON:  CT 11/27/2017 FINDINGS: Lower chest: No acute abnormality. Hepatobiliary: No focal liver abnormality is seen. No gallstones, gallbladder wall thickening, or biliary dilatation. Pancreas: Unremarkable. No pancreatic ductal dilatation or surrounding inflammatory changes.  Spleen: Normal in size without focal abnormality. Adrenals/Urinary Tract: Adrenal glands are normal. Multiple punctate nonobstructing stones within the bilateral kidneys. No ureteral stone. Bladder is normal. Stomach/Bowel: Stomach is within normal limits. Appendix appears normal. No evidence of bowel wall thickening, distention, or inflammatory changes. Vascular/Lymphatic: No significant vascular findings are present. No enlarged abdominal or pelvic lymph nodes. Reproductive: Uterus and bilateral adnexa are unremarkable. Other: No abdominal wall hernia or abnormality. No abdominopelvic ascites. Musculoskeletal: No acute or significant osseous findings. IMPRESSION: Negative for hydronephrosis or ureteral stone. Multiple punctate nonobstructing stones within the bilateral kidneys. Electronically Signed   By: Jasmine Pang M.D.   On: 05/01/2020 01:16    EKG None  Radiology CT Renal Stone Study  Result Date: 05/01/2020 CLINICAL DATA:  Bilateral flank pain EXAM: CT ABDOMEN AND PELVIS WITHOUT CONTRAST TECHNIQUE: Multidetector CT imaging of the abdomen and pelvis was performed following the standard protocol without IV contrast. COMPARISON:  CT 11/27/2017 FINDINGS: Lower chest: No acute abnormality. Hepatobiliary: No focal liver abnormality is seen. No gallstones, gallbladder wall thickening, or biliary dilatation. Pancreas: Unremarkable. No pancreatic ductal dilatation or surrounding inflammatory changes. Spleen: Normal in size without focal abnormality. Adrenals/Urinary Tract: Adrenal glands are normal. Multiple punctate nonobstructing stones within the bilateral kidneys. No ureteral stone. Bladder is normal.  Stomach/Bowel: Stomach is within normal limits. Appendix appears normal. No evidence of bowel wall thickening, distention, or inflammatory changes. Vascular/Lymphatic: No significant vascular findings are present. No enlarged abdominal or pelvic lymph nodes. Reproductive: Uterus and bilateral adnexa are  unremarkable. Other: No abdominal wall hernia or abnormality. No abdominopelvic ascites. Musculoskeletal: No acute or significant osseous findings. IMPRESSION: Negative for hydronephrosis or ureteral stone. Multiple punctate nonobstructing stones within the bilateral kidneys. Electronically Signed   By: Jasmine Pang M.D.   On: 05/01/2020 01:16    Procedures Procedures (including critical care time)  Medications Ordered in ED Medications  sodium chloride flush (NS) 0.9 % injection 3 mL (has no administration in time range)  ketorolac (TORADOL) injection 60 mg (has no administration in time range)    ED Course  I have reviewed the triage vital signs and the nursing notes.  Pertinent labs & imaging results that were available during my care of the patient were reviewed by me and considered in my medical decision making (see chart for details).    No stones in the ureter.  No hydronephrosis.  It would also be exceedingly rare to have B kidney stones.  I will treat for MSK pain.    Sumiko MAHDIYA MOSSBERG was evaluated in Emergency Department on 05/01/2020 for the symptoms described in the history of present illness. She was evaluated in the context of the global COVID-19 pandemic, which necessitated consideration that the patient might be at risk for infection with the SARS-CoV-2 virus that causes COVID-19. Institutional protocols and algorithms that pertain to the evaluation of patients at risk for COVID-19 are in a state of rapid change based on information released by regulatory bodies including the CDC and federal and state organizations. These policies and algorithms were followed during the patient's care in the ED.  Final Clinical Impression(s) / ED Diagnoses Final diagnoses:  Flank pain  Return for intractable cough, coughing up blood,fevers >100.4 unrelieved by medication, shortness of breath, intractable vomiting, chest pain, shortness of breath, weakness,numbness, changes in speech, facial  asymmetry,abdominal pain, passing out,Inability to tolerate liquids or food, cough, altered mental status or any concerns. No signs of systemic illness or infection. The patient is nontoxic-appearing on exam and vital signs are within normal limits.   I have reviewed the triage vital signs and the nursing notes. Pertinent labs &imaging results that were available during my care of the patient were reviewed by me and considered in my medical decision making (see chart for details).After history, exam, and medical workup I feel the patient has beenappropriately medically screened and is safe for discharge home. Pertinent diagnoses were discussed with the patient. Patient was given return precautions.  Rx / DC Orders ED Discharge Orders         Ordered    Diclofenac Sodium CR 100 MG 24 hr tablet  Daily     Discontinue  Reprint     05/01/20 0510    lidocaine (LIDODERM) 5 %  Every 24 hours     Discontinue  Reprint     05/01/20 0510           Daxon Kyne, MD 05/01/20 [id number]

## 2020-05-01 NOTE — ED Triage Notes (Signed)
Patient complaining of bilateral flank pain that wraps around. Patient states that started 5 days. Patient states that she is having blood in her urine. Patient has a hx of kidney stones.

## 2020-05-05 ENCOUNTER — Telehealth (INDEPENDENT_AMBULATORY_CARE_PROVIDER_SITE_OTHER): Payer: Self-pay | Admitting: Internal Medicine

## 2020-05-05 ENCOUNTER — Other Ambulatory Visit: Payer: Self-pay

## 2020-05-05 DIAGNOSIS — R3129 Other microscopic hematuria: Secondary | ICD-10-CM

## 2020-05-05 DIAGNOSIS — J4520 Mild intermittent asthma, uncomplicated: Secondary | ICD-10-CM

## 2020-05-05 DIAGNOSIS — N898 Other specified noninflammatory disorders of vagina: Secondary | ICD-10-CM

## 2020-05-05 DIAGNOSIS — R109 Unspecified abdominal pain: Secondary | ICD-10-CM

## 2020-05-05 DIAGNOSIS — K5900 Constipation, unspecified: Secondary | ICD-10-CM

## 2020-05-05 MED ORDER — POLYETHYLENE GLYCOL 3350 17 GM/SCOOP PO POWD: 17.0000 g | Freq: Every day | ORAL | 1 refills | Status: DC

## 2020-05-05 MED ORDER — ALBUTEROL SULFATE HFA 108 (90 BASE) MCG/ACT IN AERS: 2.0000 | INHALATION_SPRAY | Freq: Four times a day (QID) | RESPIRATORY_TRACT | 1 refills | Status: DC | PRN

## 2020-05-05 MED ORDER — METRONIDAZOLE 500 MG PO TABS: 500.0000 mg | ORAL_TABLET | Freq: Two times a day (BID) | ORAL | 0 refills | Status: DC

## 2020-05-05 NOTE — Progress Notes (Signed)
Virtual Visit via Telephone Note  I connected with Theresa Mooney, on 05/05/2020 at 4:07 PM by telephone due to the COVID-19 pandemic and verified that I am speaking with the correct person using two identifiers.   Consent: I discussed the limitations, risks, security and privacy concerns of performing an evaluation and management service by telephone and the availability of in person appointments. I also discussed with the patient that there may be a patient responsible charge related to this service. The patient expressed understanding and agreed to proceed.   Location of Patient: Home   Location of Provider: Clinic    Persons participating in Telemedicine visit: [name] [facility] Dr. Earlene Plater      History of Present Illness: Patient has a visit to follow up on Urgent Care and ED visits. Was seen for bilateral flank pain on 8/5; CT renal stone study was negative. Was treated for presumed musculoskeletal pain with Toradol 60 mg IM injection. Was prescribed Diclofenac and Lidocaine cream. This is improving but still occurring.   Reports constipation. Had to take Milk of Magnesia over the weekend. Was really backed up over the weekend, not able to go regular. Didn't have to strain though but did seem to have some bloating and gas buildup. But then started to have bowel movements. Has been eating a lot of fried and greasy foods.   Patient was also seen in Urgent Care on 8/1 and had negative vaginal swab for Chlamydia, Gonorrhea, and Trichomonas. She is concerned about BV. She reports that she was treated in June for BV but did not complete course of Flagyl. Thinks that she is still having the same type of symptoms--bad odor, increased amount of discharge.   Also asks for refill of her Albuterol inhaler.    Past Medical History:  Diagnosis Date   Cannabis dependence in remission [facility])    Ectopic pregnancy    Elevated glucose    Herpes 2002   Infection    Mixed  hyperlipidemia    Nicotine dependence    Trichimoniasis 12-2010   Urinary calculi    Allergies  Allergen Reactions   Hydrocodone Nausea Only    Dizziness    Current Outpatient Medications on File Prior to Visit  Medication Sig Dispense Refill   albuterol (VENTOLIN HFA) 108 (90 Base) MCG/ACT inhaler Inhale 2 puffs into the lungs every 6 (six) hours as needed for wheezing or shortness of breath. 18 g 1   Diclofenac Sodium CR 100 MG 24 hr tablet Take 1 tablet (100 mg total) by mouth daily. 10 tablet 0   lidocaine (LIDODERM) 5 % Place 1 patch onto the skin daily. Remove & Discard patch within 12 hours or as directed by MD 30 patch 0   naproxen (NAPROSYN) 500 MG tablet Take 1 tablet (500 mg total) by mouth 2 (two) times daily with a meal. 30 tablet 0   No current facility-administered medications on file prior to visit.    Observations/Objective: NAD. Speaking clearly.  Work of breathing normal.  Alert and oriented. Mood appropriate.   Assessment and Plan: 1. Vaginal discharge Will treat empirically for BV with Flagyl. Discussed proper use of medication. Return if does not improve.  - metroNIDAZOLE (FLAGYL) 500 MG tablet; Take 1 tablet (500 mg total) by mouth 2 (two) times daily.  Dispense: 14 tablet; Refill: 0  2. Mild intermittent asthma, unspecified whether complicated - albuterol (VENTOLIN HFA) 108 (90 Base) MCG/ACT inhaler; Inhale 2 puffs into the lungs every 6 (  six) hours as needed for wheezing or shortness of breath.  Dispense: 18 g; Refill: 1  3. Flank pain Resolving with treatment for msk pain. UA did not look concerning for infection and patient without urinary symptoms. CT renal study negative.   4. Constipation, unspecified constipation type - polyethylene glycol powder (GLYCOLAX/MIRALAX) 17 GM/SCOOP powder; Take 17 g by mouth daily.  Dispense: 3350 g; Refill: 1  5. Microscopic hematuria Given persistence of microscopic hematuria without concurrent cystitis,  patient would like to be referred to Urology. Patient has been seen in 2020 by urology with apparent negative cystoscopy but would like to be seen again.  - Ambulatory referral to Urology   Follow Up Instructions: PRN and for routine medical care    I discussed the assessment and treatment plan with the patient. The patient was provided an opportunity to ask questions and all were answered. The patient agreed with the plan and demonstrated an understanding of the instructions.   The patient was advised to call back or seek an in-person evaluation if the symptoms worsen or if the condition fails to improve as anticipated.     I provided 24 minutes total of non-face-to-face time during this encounter including median intraservice time, reviewing previous notes, investigations, ordering medications, medical decision making, coordinating care and patient verbalized understanding at the end of the visit.    Marcy Siren, D.O. Primary Care at [facility]  05/05/2020, 4:07 PM

## 2020-05-07 ENCOUNTER — Other Ambulatory Visit: Payer: Self-pay | Admitting: Internal Medicine

## 2020-05-07 ENCOUNTER — Telehealth: Payer: Self-pay

## 2020-05-07 MED ORDER — SUCRALFATE 1 G PO TABS: 1.0000 g | ORAL_TABLET | Freq: Three times a day (TID) | ORAL | 1 refills | Status: DC

## 2020-05-07 NOTE — Telephone Encounter (Signed)
Rx sent.   Marcy Siren, D.O. Primary Care at [facility]  05/07/2020, 5:13 PM

## 2020-05-07 NOTE — Telephone Encounter (Signed)
Pt wants sucralfate (CARAFATE) tablet 1 g for the pain she says it helps

## 2020-05-14 ENCOUNTER — Other Ambulatory Visit: Payer: Self-pay

## 2020-05-14 DIAGNOSIS — R42 Dizziness and giddiness: Secondary | ICD-10-CM | POA: Insufficient documentation

## 2020-05-14 DIAGNOSIS — K0889 Other specified disorders of teeth and supporting structures: Secondary | ICD-10-CM | POA: Insufficient documentation

## 2020-05-14 DIAGNOSIS — R519 Headache, unspecified: Secondary | ICD-10-CM | POA: Insufficient documentation

## 2020-05-15 ENCOUNTER — Encounter (HOSPITAL_COMMUNITY): Payer: Self-pay

## 2020-05-15 ENCOUNTER — Other Ambulatory Visit: Payer: Self-pay

## 2020-05-15 ENCOUNTER — Emergency Department (HOSPITAL_COMMUNITY)
Admission: EM | Admit: 2020-05-15 | Discharge: 2020-05-15 | Disposition: A | Discharge status: Home / Self Care | Payer: Medicaid Other | Attending: Emergency Medicine | Admitting: Emergency Medicine

## 2020-05-15 DIAGNOSIS — K0889 Other specified disorders of teeth and supporting structures: Secondary | ICD-10-CM

## 2020-05-15 MED ORDER — PENICILLIN V POTASSIUM 500 MG PO TABS: 500.0000 mg | ORAL_TABLET | Freq: Four times a day (QID) | ORAL | 0 refills | Status: AC

## 2020-05-15 MED ORDER — IBUPROFEN 200 MG PO TABS: 600.0000 mg | ORAL_TABLET | Freq: Once | ORAL | Status: DC

## 2020-05-15 MED ORDER — PENICILLIN V POTASSIUM 500 MG PO TABS: 500.0000 mg | ORAL_TABLET | Freq: Once | ORAL | Status: DC

## 2020-05-15 NOTE — ED Triage Notes (Signed)
Pt reports L sided dental pain, sinus pressure and headache since Monday. She states that she was started on Flagyl on Monday. A&Ox4. Ambulatory.

## 2020-05-15 NOTE — ED Provider Notes (Signed)
[facility]-EMERGENCY DEPT Provider Note   CSN: [id number] Arrival date & time: 05/14/20  2357     History Chief Complaint  Patient presents with  . Dental Pain    Theresa Mooney is a 39 y.o. female.  Patient to ED with dental pain for the past several days. No facial swelling or fever. No difficulty swallowing. She reports headache with sinus pressure and feels dizzy when she lies now. No nausea, vomiting or diarrhea. No SOB, chest pain, cough, sore throat.   The history is provided by the patient. No language interpreter was used.  Dental Pain Associated symptoms: no fever        Past Medical History:  Diagnosis Date  . 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 Ectopic pregnancy   . Elevated glucose   . Herpes 2002  . Infection   . Mixed hyperlipidemia   . Nicotine dependence   . Trichimoniasis 12-2010  . Urinary calculi     Patient Active Problem List   Diagnosis Date Noted  . h/o postpartum BTL  12/13/2011  . S/P ectopic pregnancy 12/13/2011    Past Surgical History:  Procedure Laterality Date  . DILATION AND CURETTAGE OF UTERUS    . LAPAROSCOPY  11/27/2011   Procedure: LAPAROSCOPY OPERATIVE;  Surgeon: Catalina Antigua, MD;  Location: [facility];  Service: Gynecology;  Laterality: Bilateral;  . SALPINGECTOMY     Bilateral for twin ectopic preg.   . TUBAL LIGATION  12-05-2010     OB History    Gravida  7   Para  5   Term      Preterm      AB  2   Living  5     SAB      TAB  1   Ectopic  1   Multiple      Live Births  1           Family History  Problem Relation Age of Onset  . Hypertension Mother   . Diabetes Maternal Grandmother   . Heart disease Maternal Grandmother   . Hypertension Maternal Grandmother   . Diabetes Maternal Grandfather   . Heart disease Maternal Grandfather   . Hypertension Maternal Grandfather   . Hyperlipidemia Daughter   . Breast cancer Maternal Aunt   . Anesthesia problems Neg Hx      Social History   Tobacco Use  . Smoking status: Never Smoker  . Smokeless tobacco: Never Used  Vaping Use  . Vaping Use: Never used  Substance Use Topics  . Alcohol use: Not Currently    Comment: social  . Drug use: No    Home Medications Prior to Admission medications   Medication Sig Start Date End Date Taking? Authorizing Provider  albuterol (VENTOLIN HFA) 108 (90 Base) MCG/ACT inhaler Inhale 2 puffs into the lungs every 6 (six) hours as needed for wheezing or shortness of breath. 05/05/20   Arvilla Market, DO  Diclofenac Sodium CR 100 MG 24 hr tablet Take 1 tablet (100 mg total) by mouth daily. 05/01/20   Palumbo, April, MD  lidocaine (LIDODERM) 5 % Place 1 patch onto the skin daily. Remove & Discard patch within 12 hours or as directed by MD 05/01/20   Nicanor Alcon, April, MD  metroNIDAZOLE (FLAGYL) 500 MG tablet Take 1 tablet (500 mg total) by mouth 2 (two) times daily. 05/05/20   Arvilla Market, DO  polyethylene glycol powder (GLYCOLAX/MIRALAX) 17 GM/SCOOP powder Take 17 g  by mouth daily. 05/05/20   Arvilla Market, DO  sucralfate (CARAFATE) 1 g tablet Take 1 tablet (1 g total) by mouth 4 (four) times daily -  with meals and at bedtime. 05/07/20   Arvilla Market, DO    Allergies    Hydrocodone  Review of Systems   Review of Systems  Constitutional: Negative for fever.  HENT: Positive for dental problem, sinus pressure and sinus pain. Negative for sore throat and trouble swallowing.   Respiratory: Negative for cough and shortness of breath.   Cardiovascular: Negative for chest pain.  Gastrointestinal: Negative for abdominal pain and nausea.  Neurological: Positive for light-headedness.    Physical Exam Updated Vital Signs BP (!) 144/88 (BP Location: Right Arm)   Pulse 71   Temp 98.4 F (36.9 C) (Oral)   Resp 14   LMP 04/20/2020 Comment: negative beta HCG 04/30/20  SpO2 98%   Physical Exam Vitals and nursing note reviewed.   Constitutional:      Appearance: She is well-developed.  HENT:     Head:     Comments: No facial swelling.    Mouth/Throat:     Mouth: Mucous membranes are moist.     Pharynx: Oropharynx is clear.     Comments: Generally good dentition without visible abscess or swelling.  Eyes:     Conjunctiva/sclera: Conjunctivae normal.  Pulmonary:     Effort: Pulmonary effort is normal.  Abdominal:     Tenderness: There is left CVA tenderness.  Musculoskeletal:     Cervical back: Normal range of motion.  Skin:    General: Skin is warm and dry.  Neurological:     Mental Status: She is alert and oriented to person, place, and time.     ED Results / Procedures / Treatments   Labs (all labs ordered are listed, but only abnormal results are displayed) Labs Reviewed - No data to display  EKG None  Radiology No results found.  Procedures Procedures (including critical care time)  Medications Ordered in ED Medications  penicillin v potassium (VEETID) tablet 500 mg (has no administration in time range)  ibuprofen (ADVIL) tablet 600 mg (has no administration in time range)    ED Course  I have reviewed the triage vital signs and the nursing notes.  Pertinent labs & imaging results that were available during my care of the patient were reviewed by me and considered in my medical decision making (see chart for details).    MDM Rules/Calculators/A&P                          Patient to ED with ss/sxs as detailed in the HPI.   She is overall well appearing, nontoxic, does not appear ill. Exam reassuring. Will start on penicillin for dental pain to cover for potential developing abscess. Recommend dentistry follow up.   Final Clinical Impression(s) / ED Diagnoses Final diagnoses:  None   1. Dental pain  Rx / DC Orders ED Discharge Orders    None       Elpidio Anis, PA-C 05/15/20 0507    Wilkie Aye Mayer Masker, MD 05/15/20 [phone]

## 2020-05-19 ENCOUNTER — Emergency Department (HOSPITAL_COMMUNITY): Payer: 59

## 2020-05-19 ENCOUNTER — Emergency Department (HOSPITAL_COMMUNITY)
Admission: EM | Admit: 2020-05-19 | Discharge: 2020-05-19 | Disposition: A | Discharge status: Home / Self Care | Payer: 59 | Attending: Emergency Medicine | Admitting: Emergency Medicine

## 2020-05-19 ENCOUNTER — Other Ambulatory Visit: Payer: Self-pay

## 2020-05-19 ENCOUNTER — Encounter (HOSPITAL_COMMUNITY): Payer: Self-pay | Admitting: Emergency Medicine

## 2020-05-19 DIAGNOSIS — R072 Precordial pain: Secondary | ICD-10-CM | POA: Diagnosis not present

## 2020-05-19 DIAGNOSIS — R079 Chest pain, unspecified: Secondary | ICD-10-CM | POA: Diagnosis present

## 2020-05-19 DIAGNOSIS — K219 Gastro-esophageal reflux disease without esophagitis: Secondary | ICD-10-CM | POA: Diagnosis not present

## 2020-05-19 LAB — CBC
HCT: 38.2 % (ref 36.0–46.0)
Hemoglobin: 12 g/dL (ref 12.0–15.0)
MCH: 28.3 pg (ref 26.0–34.0)
MCHC: 31.4 g/dL (ref 30.0–36.0)
MCV: 90.1 fL (ref 80.0–100.0)
Platelets: 299 10*3/uL (ref 150–400)
RBC: 4.24 MIL/uL (ref 3.87–5.11)
RDW: 13.5 % (ref 11.5–15.5)
WBC: 4.5 10*3/uL (ref 4.0–10.5)
nRBC: 0 % (ref 0.0–0.2)

## 2020-05-19 LAB — BASIC METABOLIC PANEL
Anion gap: 11 (ref 5–15)
BUN: 8 mg/dL (ref 6–20)
CO2: 26 mmol/L (ref 22–32)
Calcium: 9.2 mg/dL (ref 8.9–10.3)
Chloride: 103 mmol/L (ref 98–111)
Creatinine, Ser: 0.88 mg/dL (ref 0.44–1.00)
GFR calc Af Amer: >60 mL/min (ref >60)
GFR calc non Af Amer: >60 mL/min (ref >60)
Glucose, Bld: 88 mg/dL (ref 70–99)
Potassium: 3.6 mmol/L (ref 3.5–5.1)
Sodium: 140 mmol/L (ref 135–145)

## 2020-05-19 LAB — I-STAT BETA HCG BLOOD, ED (NOT ORDERABLE): I-stat hCG, quantitative: <5 m[IU]/mL (ref <5)

## 2020-05-19 LAB — TROPONIN I (HIGH SENSITIVITY)
Troponin I (High Sensitivity): 24 ng/L — ABNORMAL HIGH (ref <18)
Troponin I (High Sensitivity): 23 ng/L — ABNORMAL HIGH (ref <18)

## 2020-05-19 MED ORDER — OMEPRAZOLE 20 MG PO CPDR: 20.0000 mg | DELAYED_RELEASE_CAPSULE | Freq: Every day | ORAL | 0 refills | Status: DC

## 2020-05-19 MED ORDER — PANTOPRAZOLE SODIUM 40 MG PO TBEC
40.0000 mg | DELAYED_RELEASE_TABLET | Freq: Once | ORAL | Status: AC
  Administered 2020-05-19: 40 mg via ORAL
  Filled 2020-05-19: qty 1

## 2020-05-19 MED ORDER — ALUM & MAG HYDROXIDE-SIMETH 200-200-20 MG/5ML PO SUSP
30.0000 mL | Freq: Once | ORAL | Status: AC
  Administered 2020-05-19: 30 mL via ORAL
  Filled 2020-05-19: qty 30

## 2020-05-19 NOTE — Discharge Instructions (Addendum)
Please read instructions below. Drink clear liquids until your stomach feels better. Then, slowly introduce bland foods into your diet as tolerated.  Avoid spicy, greasy, acidic foods as this can worsen your symptoms.  Avoid NSAID medications such as Advil/ibuprofen/Motrin, Aleve, aspirin, Goody's powder, BC powder.  Remain is sitting upright for at least 45 minutes after meals. Continue taking your carafate as directed. Take the omeprazole daily. You can get this over-the-counter after your prescription runs out. Follow up with your primary care provider within the next couple of days Return to the ER for exertional chest pain, persistent shortness of breath, chest pain that radiates to your jaw or left arm, severely worsening abdominal pain, fever, uncontrollable vomiting, or vomiting blood.

## 2020-05-19 NOTE — ED Provider Notes (Signed)
[facility]-EMERGENCY DEPT Provider Note   CSN: [id number] Arrival date & time: 05/19/20  0740     History Chief Complaint  Patient presents with  . Chest Pain  . Gastroesophageal Reflux    Theresa Mooney is a 39 y.o. female with past medical history high cholesterol, presenting to the emergency department with complaint of multiple days of intermittent indigestion and chest tightness.  She states symptoms are mostly postprandial after meals.  She feels discomfort in her chest as if she has gas and needs to belch.  She feels like her food is getting stuck. She states that this causes her to feel somewhat short of breath.  She reports her symptoms worsened after eating [place] food at [employer] on Friday night.  She has been treating her symptoms with Tums, Gas-X, Rolaids, Alka-Seltzer without relief.  She reports she used to drink daily alcohol, however since a few weeks ago she now only drinks red wine once or twice a week. No cardiac hx or infectious symptoms.  The history is provided by the patient.       Past Medical History:  Diagnosis Date  . 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 Ectopic pregnancy   . Elevated glucose   . Herpes 2002  . Infection   . Mixed hyperlipidemia   . Nicotine dependence   . Trichimoniasis 12-2010  . Urinary calculi     Patient Active Problem List   Diagnosis Date Noted  . h/o postpartum BTL  12/13/2011  . S/P ectopic pregnancy 12/13/2011    Past Surgical History:  Procedure Laterality Date  . DILATION AND CURETTAGE OF UTERUS    . LAPAROSCOPY  11/27/2011   Procedure: LAPAROSCOPY OPERATIVE;  Surgeon: Catalina Antigua, MD;  Location: [facility];  Service: Gynecology;  Laterality: Bilateral;  . SALPINGECTOMY     Bilateral for twin ectopic preg.   . TUBAL LIGATION  12-05-2010     OB History    Gravida  7   Para  5   Term      Preterm      AB  2   Living  5     SAB      TAB  1   Ectopic  1   Multiple        Live Births  1           Family History  Problem Relation Age of Onset  . Hypertension Mother   . Diabetes Maternal Grandmother   . Heart disease Maternal Grandmother   . Hypertension Maternal Grandmother   . Diabetes Maternal Grandfather   . Heart disease Maternal Grandfather   . Hypertension Maternal Grandfather   . Hyperlipidemia Daughter   . Breast cancer Maternal Aunt   . Anesthesia problems Neg Hx     Social History   Tobacco Use  . Smoking status: Never Smoker  . Smokeless tobacco: Never Used  Vaping Use  . Vaping Use: Never used  Substance Use Topics  . Alcohol use: Not Currently    Comment: social  . Drug use: No    Home Medications Prior to Admission medications   Medication Sig Start Date End Date Taking? Authorizing Provider  albuterol (VENTOLIN HFA) 108 (90 Base) MCG/ACT inhaler Inhale 2 puffs into the lungs every 6 (six) hours as needed for wheezing or shortness of breath. 05/05/20   Arvilla Market, DO  Diclofenac Sodium CR 100 MG 24 hr tablet Take 1 tablet (100 mg  total) by mouth daily. 05/01/20   Palumbo, April, MD  lidocaine (LIDODERM) 5 % Place 1 patch onto the skin daily. Remove & Discard patch within 12 hours or as directed by MD 05/01/20   Nicanor Alcon, April, MD  metroNIDAZOLE (FLAGYL) 500 MG tablet Take 1 tablet (500 mg total) by mouth 2 (two) times daily. 05/05/20   Arvilla Market, DO  omeprazole (PRILOSEC) 20 MG capsule Take 1 capsule (20 mg total) by mouth daily. 05/19/20   Hennesy Sobalvarro, [place] N, PA-C  penicillin v potassium (VEETID) 500 MG tablet Take 1 tablet (500 mg total) by mouth 4 (four) times daily for 10 days. 05/15/20 05/25/20  Elpidio Anis, PA-C  polyethylene glycol powder (GLYCOLAX/MIRALAX) 17 GM/SCOOP powder Take 17 g by mouth daily. 05/05/20   Arvilla Market, DO  sucralfate (CARAFATE) 1 g tablet Take 1 tablet (1 g total) by mouth 4 (four) times daily -  with meals and at bedtime. 05/07/20   Arvilla Market, DO    Allergies    Hydrocodone  Review of Systems   Review of Systems  All other systems reviewed and are negative.   Physical Exam Updated Vital Signs BP 120/72 (BP Location: Left Arm)   Pulse 64   Temp 98.9 F (37.2 C) (Oral)   Resp 18   Ht 4\' 11"  (1.499 m)   Wt 97.1 kg   LMP 05/19/2020 Comment: negative beta HCG 04/30/20  SpO2 98%   BMI 43.22 kg/m   Physical Exam Vitals and nursing note reviewed.  Constitutional:      General: She is not in acute distress.    Appearance: She is well-developed. She is not ill-appearing.  HENT:     Head: Normocephalic and atraumatic.  Eyes:     Conjunctiva/sclera: Conjunctivae normal.  Cardiovascular:     Rate and Rhythm: Normal rate and regular rhythm.  Pulmonary:     Effort: Pulmonary effort is normal. No respiratory distress.     Breath sounds: Normal breath sounds.  Chest:     Chest wall: Tenderness present.  Abdominal:     General: Bowel sounds are normal.     Palpations: Abdomen is soft.     Tenderness: There is abdominal tenderness in the epigastric area. There is no guarding or rebound.  Skin:    General: Skin is warm.  Neurological:     Mental Status: She is alert.  Psychiatric:        Behavior: Behavior normal.     ED Results / Procedures / Treatments   Labs (all labs ordered are listed, but only abnormal results are displayed) Labs Reviewed  TROPONIN I (HIGH SENSITIVITY) - Abnormal; Notable for the following components:      Result Value   Troponin I (High Sensitivity) 24 (*)    All other components within normal limits  TROPONIN I (HIGH SENSITIVITY) - Abnormal; Notable for the following components:   Troponin I (High Sensitivity) 23 (*)    All other components within normal limits  BASIC METABOLIC PANEL  CBC  I-STAT BETA HCG BLOOD, ED ([facility], [facility], [facility] ONLY)  I-STAT BETA HCG BLOOD, ED (NOT ORDERABLE)    EKG EKG Interpretation  Date/Time:  Monday May 19 2020 18:03:15 EDT Ventricular Rate:   68 PR Interval:    QRS Duration: 100 QT Interval:  437 QTC Calculation: 465 R Axis:   34 Text Interpretation: Sinus rhythm Abnormal R-wave progression, early transition Borderline T wave abnormalities 12 Lead; Mason-Likar No significant change since last tracing  Confirmed by Richardean Canal [id number]) on 05/19/2020 7:58:50 PM   Radiology DG Chest 2 View  Result Date: 05/19/2020 CLINICAL DATA:  Shortness of breath and chest pain, history of asthma EXAM: CHEST - 2 VIEW COMPARISON:  09/26/2018 FINDINGS: The heart size and mediastinal contours are within normal limits. Both lungs are clear. The visualized skeletal structures are unremarkable. IMPRESSION: No active cardiopulmonary disease. Electronically Signed   By: Judie Petit.  Shick M.D.   On: 05/19/2020 08:32    Procedures Procedures (including critical care time)  Medications Ordered in ED Medications  alum & mag hydroxide-simeth (MAALOX/MYLANTA) 200-200-20 MG/5ML suspension 30 mL (30 mLs Oral Given 05/19/20 2114)  pantoprazole (PROTONIX) EC tablet 40 mg (40 mg Oral Given 05/19/20 2114)    ED Course  I have reviewed the triage vital signs and the nursing notes.  Pertinent labs & imaging results that were available during my care of the patient were reviewed by me and considered in my medical decision making (see chart for details).    MDM Rules/Calculators/A&P                          Patient presenting with multiple days of worsening GERD, belching, and chest tightness.  Symptoms are postprandial in nature.  She takes Carafate at home as well as additional over-the-counter antacids for symptoms.  She used to be followed by GI for this.  No known heart disease.  On exam she is very well-appearing.  Heart and lung sounds are normal.  Initial laboratory work-up initiated in triage with CBC and metabolic panel in normal limits, hCG is negative.  Initial troponin is 24, repeat troponin is 23.  No previous to compare to, however troponin is stable.  EKG  read by attending is unchanged from previous.  Chest x-ray is negative.  Discussed with attending Dr. Silverio Lay, recommends discharge with outpatient follow-up given low risk, atypical symptoms, and stable troponin.  Discussed with patient.  Symptoms seem most likely GI related given known history of GERD and symptoms of indigestion.  Recommend she begin taking PPI, continue Carafate as needed, discussed diet modifications and close follow-up with GI specialist.  Patient verbalized understanding agrees with care plan.  Discussed reasons return to the ED  Discussed results, findings, treatment and follow up. Patient advised of return precautions. Patient verbalized understanding and agreed with plan.  Final Clinical Impression(s) / ED Diagnoses Final diagnoses:  Gastroesophageal reflux disease, unspecified whether esophagitis present  Substernal chest pain    Rx / DC Orders ED Discharge Orders         Ordered    omeprazole (PRILOSEC) 20 MG capsule  Daily        05/19/20 2049           Sharlyn Odonnel, [place] N, PA-C 05/19/20 2349    Charlynne Pander, MD 05/20/20 [phone]

## 2020-05-19 NOTE — ED Notes (Signed)
Pt O2 reading 98% RA while ambulating unassisted.

## 2020-05-19 NOTE — ED Notes (Signed)
Was not able to get Troponin.  Informed PA.  Called Lab and spoke to [place] she said she would be here in 30 minutes.

## 2020-05-19 NOTE — ED Triage Notes (Signed)
Patient complains of mid-sternal chest pain since eating [place] last week, states she has been burping and it is hard to clear her burps, and it burns. Stated she thinks she has a hernia and she gets constipated occasionally.

## 2020-05-19 NOTE — ED Notes (Signed)
An After Visit Summary was printed and given to the patient. Discharge instructions given and no further questions at this time.  

## 2020-05-20 ENCOUNTER — Telehealth: Payer: Self-pay

## 2020-05-20 NOTE — Telephone Encounter (Signed)
Patient called stating she went to the Emergency Department last Thursday for Dental pain and was prescribed penicillin v potassium (VEETID) 500 MG tablet  To treat a dental infection. Patient states she went back yesterday to the Emergency Department since now she is having chest pain and gastroesophageal Reflux. Patient states her throat is swollen and she can not swallow anything. Patient will like to know if PCP can prescribe and medication.  Patient uses [facility] [phone] - [place], [place] - [phone] AT [facility] [address] & [facility]

## 2020-05-20 NOTE — Telephone Encounter (Signed)
Please advise 

## 2020-05-20 NOTE — Telephone Encounter (Signed)
I need some clarification. It looks like she was just prescribed an acid blocking medication, Omeprazole, late last night with d/c from ED. What type of medication is she asking for? I would recommend she start the medication they prescribed as her throat pain is likely related to uncontrolled GERD if she had a negative work up in ER.   Marcy Siren, D.O. Primary Care at [facility]  05/20/2020, 9:59 AM

## 2020-05-21 ENCOUNTER — Ambulatory Visit: Payer: Self-pay | Admitting: Urology

## 2020-05-22 ENCOUNTER — Other Ambulatory Visit: Payer: Self-pay

## 2020-05-22 ENCOUNTER — Emergency Department (HOSPITAL_COMMUNITY): Payer: 59

## 2020-05-22 ENCOUNTER — Emergency Department (HOSPITAL_COMMUNITY)
Admission: EM | Admit: 2020-05-22 | Discharge: 2020-05-22 | Disposition: A | Discharge status: Home / Self Care | Payer: 59 | Attending: Emergency Medicine | Admitting: Emergency Medicine

## 2020-05-22 ENCOUNTER — Encounter (HOSPITAL_COMMUNITY): Payer: Self-pay | Admitting: Emergency Medicine

## 2020-05-22 DIAGNOSIS — Z79899 Other long term (current) drug therapy: Secondary | ICD-10-CM | POA: Insufficient documentation

## 2020-05-22 DIAGNOSIS — Z20822 Contact with and (suspected) exposure to covid-19: Secondary | ICD-10-CM | POA: Insufficient documentation

## 2020-05-22 DIAGNOSIS — R0789 Other chest pain: Secondary | ICD-10-CM | POA: Diagnosis present

## 2020-05-22 LAB — CBC
HCT: 37.7 % (ref 36.0–46.0)
Hemoglobin: 11.8 g/dL — ABNORMAL LOW (ref 12.0–15.0)
MCH: 28.2 pg (ref 26.0–34.0)
MCHC: 31.3 g/dL (ref 30.0–36.0)
MCV: 90 fL (ref 80.0–100.0)
Platelets: 294 10*3/uL (ref 150–400)
RBC: 4.19 MIL/uL (ref 3.87–5.11)
RDW: 13.5 % (ref 11.5–15.5)
WBC: 6.3 10*3/uL (ref 4.0–10.5)
nRBC: 0 % (ref 0.0–0.2)

## 2020-05-22 LAB — SARS CORONAVIRUS 2 BY RT PCR (HOSPITAL ORDER, PERFORMED IN ~~LOC~~ HOSPITAL LAB): SARS Coronavirus 2: NEGATIVE

## 2020-05-22 LAB — BASIC METABOLIC PANEL
Anion gap: 11 (ref 5–15)
BUN: 8 mg/dL (ref 6–20)
CO2: 25 mmol/L (ref 22–32)
Calcium: 9.1 mg/dL (ref 8.9–10.3)
Chloride: 102 mmol/L (ref 98–111)
Creatinine, Ser: 1.03 mg/dL — ABNORMAL HIGH (ref 0.44–1.00)
GFR calc Af Amer: >60 mL/min (ref >60)
GFR calc non Af Amer: >60 mL/min (ref >60)
Glucose, Bld: 89 mg/dL (ref 70–99)
Potassium: 4.4 mmol/L (ref 3.5–5.1)
Sodium: 138 mmol/L (ref 135–145)

## 2020-05-22 LAB — I-STAT BETA HCG BLOOD, ED (MC, WL, AP ONLY): I-stat hCG, quantitative: <5 m[IU]/mL (ref <5)

## 2020-05-22 LAB — TROPONIN I (HIGH SENSITIVITY): Troponin I (High Sensitivity): 5 ng/L (ref <18)

## 2020-05-22 NOTE — ED Notes (Signed)
Patient very hard stick. Asked charge RN to help place IV and get labs. Charge RN unable at this time. IV team consulted.

## 2020-05-22 NOTE — ED Provider Notes (Signed)
[facility]-EMERGENCY DEPT Provider Note   CSN: [id number] Arrival date & time: 05/22/20  1544     History Chief Complaint  Patient presents with  . Chest Pain  . chest pain  . Dental Pain    Theresa Mooney is a 39 y.o. female.  The history is provided by the patient and medical records. No language interpreter was used.  Chest Pain Dental Pain    39 year old female significant history of tobacco abuse, marijuana use, brought here via EMS from home for evaluation of chest discomfort.  Patient has known history of dyspepsia and reflux.  Theresa Mooney recently had a dental infection which Theresa Mooney takes antibiotic for 1 week.  For the past few days Theresa Mooney developed progressive burning substernal pain and decreased swallowing.  Theresa Mooney was evaluated by GI specialist 2 days ago.  It was thought that patient has acute esophagitis most likely from reflux but possibly viral.  Theresa Mooney was prescribed medication to help her with her symptoms.  Theresa Mooney mention today Theresa Mooney noticed pain to the right side of her chest.  Theresa Mooney described as a pressure sensation and tightness radiates towards her neck.  Pain worsened with movement or when Theresa Mooney turns.  Theresa Mooney reports the same pain has been ongoing for the past week.  Theresa Mooney attributes to laying on her side when Theresa Mooney sleeps.  Theresa Mooney also mention her children recently was exposed to someone at school that has COVID-19.  Theresa Mooney is concerned for potential infection.  Theresa Mooney however denies having any loss of taste or smell, runny nose sneezing coughing, vomiting or diarrhea.  Theresa Mooney has not had a Covid vaccination.  Her discomfort is mild to moderate at this time.  Past Medical History:  Diagnosis Date  . 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 Ectopic pregnancy   . Elevated glucose   . Herpes 2002  . Infection   . Mixed hyperlipidemia   . Nicotine dependence   . Trichimoniasis 12-2010  . Urinary calculi     Patient Active Problem List   Diagnosis Date Noted  . h/o postpartum BTL   12/13/2011  . S/P ectopic pregnancy 12/13/2011    Past Surgical History:  Procedure Laterality Date  . DILATION AND CURETTAGE OF UTERUS    . LAPAROSCOPY  11/27/2011   Procedure: LAPAROSCOPY OPERATIVE;  Surgeon: Catalina Antigua, MD;  Location: [facility];  Service: Gynecology;  Laterality: Bilateral;  . SALPINGECTOMY     Bilateral for twin ectopic preg.   . TUBAL LIGATION  12-05-2010     OB History    Gravida  7   Para  5   Term      Preterm      AB  2   Living  5     SAB      TAB  1   Ectopic  1   Multiple      Live Births  1           Family History  Problem Relation Age of Onset  . Hypertension Mother   . Diabetes Maternal Grandmother   . Heart disease Maternal Grandmother   . Hypertension Maternal Grandmother   . Diabetes Maternal Grandfather   . Heart disease Maternal Grandfather   . Hypertension Maternal Grandfather   . Hyperlipidemia Daughter   . Breast cancer Maternal Aunt   . Anesthesia problems Neg Hx     Social History   Tobacco Use  . Smoking status: Never Smoker  . Smokeless tobacco: Never  Used  Vaping Use  . Vaping Use: Never used  Substance Use Topics  . Alcohol use: Not Currently    Comment: social  . Drug use: No    Home Medications Prior to Admission medications   Medication Sig Start Date End Date Taking? Authorizing Provider  albuterol (VENTOLIN HFA) 108 (90 Base) MCG/ACT inhaler Inhale 2 puffs into the lungs every 6 (six) hours as needed for wheezing or shortness of breath. 05/05/20   Arvilla Market, DO  Diclofenac Sodium CR 100 MG 24 hr tablet Take 1 tablet (100 mg total) by mouth daily. 05/01/20   Palumbo, April, MD  lidocaine (LIDODERM) 5 % Place 1 patch onto the skin daily. Remove & Discard patch within 12 hours or as directed by MD 05/01/20   Nicanor Alcon, April, MD  metroNIDAZOLE (FLAGYL) 500 MG tablet Take 1 tablet (500 mg total) by mouth 2 (two) times daily. 05/05/20   Arvilla Market, DO  omeprazole (PRILOSEC)  20 MG capsule Take 1 capsule (20 mg total) by mouth daily. 05/19/20   Robinson, [place] N, PA-C  penicillin v potassium (VEETID) 500 MG tablet Take 1 tablet (500 mg total) by mouth 4 (four) times daily for 10 days. 05/15/20 05/25/20  Elpidio Anis, PA-C  polyethylene glycol powder (GLYCOLAX/MIRALAX) 17 GM/SCOOP powder Take 17 g by mouth daily. 05/05/20   Arvilla Market, DO  sucralfate (CARAFATE) 1 g tablet Take 1 tablet (1 g total) by mouth 4 (four) times daily -  with meals and at bedtime. 05/07/20   Arvilla Market, DO    Allergies    Hydrocodone  Review of Systems   Review of Systems  Cardiovascular: Positive for chest pain.  All other systems reviewed and are negative.   Physical Exam Updated Vital Signs BP 138/85 (BP Location: Right Arm)   Pulse 81   Temp 98.5 F (36.9 C) (Oral)   Resp 17   LMP 05/19/2020   SpO2 100%   Physical Exam Vitals and nursing note reviewed.  Constitutional:      General: Theresa Mooney is not in acute distress.    Appearance: Theresa Mooney is well-developed.  HENT:     Head: Atraumatic.     Mouth/Throat:     Mouth: Mucous membranes are moist.  Eyes:     Conjunctiva/sclera: Conjunctivae normal.  Cardiovascular:     Rate and Rhythm: Normal rate and regular rhythm.     Pulses: Normal pulses.     Heart sounds: Normal heart sounds.  Pulmonary:     Effort: Pulmonary effort is normal.     Breath sounds: Normal breath sounds. No wheezing, rhonchi or rales.  Chest:     Chest wall: No tenderness.  Abdominal:     Palpations: Abdomen is soft.     Tenderness: There is no abdominal tenderness.  Musculoskeletal:        General: No swelling.     Cervical back: Neck supple.  Skin:    Capillary Refill: Capillary refill takes less than 2 seconds.     Findings: No rash.  Neurological:     Mental Status: Theresa Mooney is alert and oriented to person, place, and time.  Psychiatric:        Mood and Affect: Mood normal.     ED Results / Procedures / Treatments     Labs (all labs ordered are listed, but only abnormal results are displayed) Labs Reviewed  BASIC METABOLIC PANEL - Abnormal; Notable for the following components:      Result  Value   Creatinine, Ser 1.03 (*)    All other components within normal limits  CBC - Abnormal; Notable for the following components:   Hemoglobin 11.8 (*)    All other components within normal limits  SARS CORONAVIRUS 2 BY RT PCR (HOSPITAL ORDER, PERFORMED IN  HOSPITAL LAB)  I-STAT BETA HCG BLOOD, ED ([facility], [facility], [facility] ONLY)  TROPONIN I (HIGH SENSITIVITY)  TROPONIN I (HIGH SENSITIVITY)    EKG None  ED ECG REPORT   Date: 05/22/2020  Rate: 79  Rhythm: normal sinus rhythm  QRS Axis: normal  Intervals: QT prolonged  ST/T Wave abnormalities: nonspecific T wave changes and abnormal R wave progression  Conduction Disutrbances:none  Narrative Interpretation:   Old EKG Reviewed: unchanged  I have personally reviewed the EKG tracing and agree with the computerized printout as noted.   Radiology DG Chest 2 View  Result Date: 05/22/2020 CLINICAL DATA:  Chest pain and heaviness. EXAM: CHEST - 2 VIEW COMPARISON:  05/19/2020 FINDINGS: The heart size and mediastinal contours are within normal limits. Both lungs are clear. The visualized skeletal structures are unremarkable. IMPRESSION: No active cardiopulmonary disease. Electronically Signed   By: Kennith CenterEric  Mansell M.D.   On: 05/22/2020 16:35    Procedures Procedures (including critical care time)  Medications Ordered in ED Medications - No data to display  ED Course  I have reviewed the triage vital signs and the nursing notes.  Pertinent labs & imaging results that were available during my care of the patient were reviewed by me and considered in my medical decision making (see chart for details).    MDM Rules/Calculators/A&P                          BP 129/89   Pulse 89   Temp 98.5 F (36.9 C) (Oral)   Resp 17   Ht 4\' 11"  (1.499 m)   Wt 94.3 kg    LMP 05/19/2020   SpO2 100%   BMI 42.01 kg/m   Final Clinical Impression(s) / ED Diagnoses Final diagnoses:  None    Rx / DC Orders ED Discharge Orders    None     7:04 PM Patient here with recurrent right-sided chest pain and some shortness of breath.  Theresa Mooney also voiced concern for COVID-19 as children recently was exposed to someone with COVID-19.  Theresa Mooney however does not have any significant URI symptoms.  Theresa Mooney has had dental pain, currently taking antibiotic as well as has esophageal reflux with esophagitis diagnosed by gastroenterologist with endoscopy several days prior.  Symptoms is atypical of ACS.  I have low suspicion for PE.  Initial chest x-ray unremarkable.  Will obtain COVID-19 test as well as basic labs.  Patient at this time appears comfortable, vital signs stable, afebrile.  10:40 PM EKG shows prolonged QT but no ischemic changes.  Labs are reassuring.  COVID test negative.  Reassurance given.  Pt to f/u outpt for further evaluation.  Stable for discharge.   Theresa Mooney was evaluated in Emergency Department on 05/22/2020 for the symptoms described in the history of present illness. Theresa Mooney was evaluated in the context of the global COVID-19 pandemic, which necessitated consideration that the patient might be at risk for infection with the SARS-CoV-2 virus that causes COVID-19. Institutional protocols and algorithms that pertain to the evaluation of patients at risk for COVID-19 are in a state of rapid change based on information released by regulatory bodies including the  CDC and federal and [employer]. These policies and algorithms were followed during the patient's care in the ED.    Fayrene Helper, PA-C 05/22/20 2243    Lorre Nick, MD 05/26/20 [phone]

## 2020-05-22 NOTE — ED Triage Notes (Signed)
Pt c/o chest heaviness that started when she woke up this morning. Reports went to dentist this am for her dental pain. Took PCN and reports that makes her feel weird. Pt kids had covid exposure and was trying to take them to get tested. Pt had her esophagus stretched yesterday so only been able to eat liquids.

## 2020-05-22 NOTE — Discharge Instructions (Signed)
Your COVID test is negative.  No evidence of pneumonia on  your chest xray.  Your labs did not show any concerning finding.  Please follow up with your doctor for further care.  Return to the ER if you have any concerns.

## 2020-05-22 NOTE — ED Notes (Signed)
Pt ambulated to the restroom with no assistance. Gait steady  

## 2020-05-22 NOTE — ED Triage Notes (Signed)
EMS reports pt was seen for a gum infection and given penicillin injection and now pt states she fells shes having allergic reaction to the injection. Pt also had balloon endoscopy yesterday for previous esophagus issue. Pt now c/o tigness in throat and right sided chest pain. Airway intact. No labored breathing or obvious distress. Pt states pain is worse inspiration and palpation.

## 2020-05-26 ENCOUNTER — Ambulatory Visit
Admission: EM | Admit: 2020-05-26 | Discharge: 2020-05-26 | Disposition: A | Discharge status: Home / Self Care | Payer: Self-pay | Attending: Emergency Medicine | Admitting: Emergency Medicine

## 2020-05-26 DIAGNOSIS — J019 Acute sinusitis, unspecified: Secondary | ICD-10-CM

## 2020-05-26 MED ORDER — DOXYCYCLINE HYCLATE 100 MG PO CAPS: 100.0000 mg | ORAL_CAPSULE | Freq: Two times a day (BID) | ORAL | 0 refills | Status: AC

## 2020-05-26 MED ORDER — FLUCONAZOLE 150 MG PO TABS: 150.0000 mg | ORAL_TABLET | Freq: Once | ORAL | 0 refills | Status: AC

## 2020-05-26 NOTE — Discharge Instructions (Signed)
Doxycycline prescribed Continue prednisone and albuterol  Continue nasal sprays Probiotics/yogurt to help with any diarrhea Follow up if not improving or worsening

## 2020-05-26 NOTE — ED Triage Notes (Signed)
Pt c/o sinus pressure, ear pressure, and headache x2wks. States had a tooth pulled today. States had a neg covid last Thursday when she went to the ED

## 2020-05-27 ENCOUNTER — Encounter (HOSPITAL_COMMUNITY): Payer: Self-pay

## 2020-05-27 ENCOUNTER — Emergency Department (HOSPITAL_COMMUNITY)
Admission: EM | Admit: 2020-05-27 | Discharge: 2020-05-28 | Disposition: A | Discharge status: Home / Self Care | Payer: 59 | Attending: Emergency Medicine | Admitting: Emergency Medicine

## 2020-05-27 ENCOUNTER — Other Ambulatory Visit: Payer: Self-pay

## 2020-05-27 DIAGNOSIS — R519 Headache, unspecified: Secondary | ICD-10-CM | POA: Insufficient documentation

## 2020-05-27 DIAGNOSIS — Z79899 Other long term (current) drug therapy: Secondary | ICD-10-CM | POA: Diagnosis not present

## 2020-05-27 DIAGNOSIS — R300 Dysuria: Secondary | ICD-10-CM | POA: Diagnosis not present

## 2020-05-27 LAB — URINALYSIS, ROUTINE W REFLEX MICROSCOPIC
Bilirubin Urine: NEGATIVE
Glucose, UA: NEGATIVE mg/dL
Ketones, ur: 20 mg/dL — AB
Nitrite: NEGATIVE
Protein, ur: NEGATIVE mg/dL
Specific Gravity, Urine: 1.018 (ref 1.005–1.030)
pH: 5 (ref 5.0–8.0)

## 2020-05-27 MED ORDER — KETOROLAC TROMETHAMINE 30 MG/ML IJ SOLN
30.0000 mg | Freq: Once | INTRAMUSCULAR | Status: AC
  Administered 2020-05-27: 30 mg via INTRAMUSCULAR
  Filled 2020-05-27: qty 1

## 2020-05-27 MED ORDER — PROMETHAZINE HCL 25 MG/ML IJ SOLN
25.0000 mg | Freq: Once | INTRAMUSCULAR | Status: AC
  Administered 2020-05-27: 25 mg via INTRAMUSCULAR
  Filled 2020-05-27: qty 1

## 2020-05-27 NOTE — ED Triage Notes (Signed)
Patient c/o headache, otalgia, and dizziness x 2 weeks. Patient states a history of headache, but states this is worse. Patient reports that she had a wisdom tooth pulled and her gums were infected.  Patient also c/o upper, mid, and lower back pain.

## 2020-05-27 NOTE — ED Provider Notes (Signed)
[name]    CSN: [id number] Arrival date & time: 05/26/20  1536      History   Chief Complaint Chief Complaint  Patient presents with  . Facial Pain    HPI Theresa Mooney is a 39 y.o. female presenting today for evaluation of sinus pressure and headaches.  Patient reports over the past 2 weeks she has had a lot of sinus pressure, congestion, ear pressure as well as headaches.  Headaches wraparound entire head.  She reports that she also recently had a tooth pulled and was recently started on penicillin, felt sensation of shortness of breath and throat swelling and was switched to clindamycin.  Had negative Covid test last week in the emergency room.  Has been using nasal sprays and other over-the-counter medicines without relief.  HPI  Past Medical History:  Diagnosis Date  . 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 Ectopic pregnancy   . Elevated glucose   . Herpes 2002  . Infection   . Mixed hyperlipidemia   . Nicotine dependence   . Trichimoniasis 12-2010  . Urinary calculi     Patient Active Problem List   Diagnosis Date Noted  . h/o postpartum BTL  12/13/2011  . S/P ectopic pregnancy 12/13/2011    Past Surgical History:  Procedure Laterality Date  . DILATION AND CURETTAGE OF UTERUS    . LAPAROSCOPY  11/27/2011   Procedure: LAPAROSCOPY OPERATIVE;  Surgeon: Catalina Antigua, MD;  Location: [facility];  Service: Gynecology;  Laterality: Bilateral;  . SALPINGECTOMY     Bilateral for twin ectopic preg.   . TUBAL LIGATION  12-05-2010    OB History    Gravida  7   Para  5   Term      Preterm      AB  2   Living  5     SAB      TAB  1   Ectopic  1   Multiple      Live Births  1            Home Medications    Prior to Admission medications   Medication Sig Start Date End Date Taking? Authorizing Provider  albuterol (VENTOLIN HFA) 108 (90 Base) MCG/ACT inhaler Inhale 2 puffs into the lungs every 6 (six) hours as needed for  wheezing or shortness of breath. 05/05/20   Arvilla Market, DO  Diclofenac Sodium CR 100 MG 24 hr tablet Take 1 tablet (100 mg total) by mouth daily. 05/01/20   Palumbo, April, MD  doxycycline (VIBRAMYCIN) 100 MG capsule Take 1 capsule (100 mg total) by mouth 2 (two) times daily for 7 days. 05/26/20 06/02/20  Anatalia Kronk C, PA-C  lidocaine (LIDODERM) 5 % Place 1 patch onto the skin daily. Remove & Discard patch within 12 hours or as directed by MD 05/01/20   Nicanor Alcon, April, MD  metroNIDAZOLE (FLAGYL) 500 MG tablet Take 1 tablet (500 mg total) by mouth 2 (two) times daily. 05/05/20   Arvilla Market, DO  omeprazole (PRILOSEC) 20 MG capsule Take 1 capsule (20 mg total) by mouth daily. 05/19/20   Robinson, [place] N, PA-C  polyethylene glycol powder (GLYCOLAX/MIRALAX) 17 GM/SCOOP powder Take 17 g by mouth daily. 05/05/20   Arvilla Market, DO  sucralfate (CARAFATE) 1 g tablet Take 1 tablet (1 g total) by mouth 4 (four) times daily -  with meals and at bedtime. 05/07/20   Arvilla Market, DO    Family  History Family History  Problem Relation Age of Onset  . Hypertension Mother   . Diabetes Maternal Grandmother   . Heart disease Maternal Grandmother   . Hypertension Maternal Grandmother   . Diabetes Maternal Grandfather   . Heart disease Maternal Grandfather   . Hypertension Maternal Grandfather   . Hyperlipidemia Daughter   . Breast cancer Maternal Aunt   . Anesthesia problems Neg Hx     Social History Social History   Tobacco Use  . Smoking status: Never Smoker  . Smokeless tobacco: Never Used  Vaping Use  . Vaping Use: Never used  Substance Use Topics  . Alcohol use: Not Currently    Comment: social  . Drug use: No     Allergies   Hydrocodone   Review of Systems Review of Systems  Constitutional: Positive for fatigue. Negative for activity change, appetite change, chills and fever.  HENT: Positive for congestion, rhinorrhea and sinus pressure.  Negative for ear pain, sore throat and trouble swallowing.   Eyes: Negative for discharge and redness.  Respiratory: Negative for cough, chest tightness and shortness of breath.   Cardiovascular: Negative for chest pain.  Gastrointestinal: Negative for abdominal pain, diarrhea, nausea and vomiting.  Musculoskeletal: Negative for myalgias.  Skin: Negative for rash.  Neurological: Positive for headaches. Negative for dizziness and light-headedness.     Physical Exam Triage Vital Signs ED Triage Vitals  Enc Vitals Group     BP 05/26/20 1800 114/76     Pulse Rate 05/26/20 1800 78     Resp 05/26/20 1800 18     Temp 05/26/20 1800 98.8 F (37.1 C)     Temp Source 05/26/20 1800 Oral     SpO2 05/26/20 1800 95 %     Weight --      Height --      Head Circumference --      Peak Flow --      Pain Score 05/26/20 1825 6     Pain Loc --      Pain Edu? --      Excl. in GC? --    No data found.  Updated Vital Signs BP 114/76 (BP Location: Left Arm)   Pulse 78   Temp 98.8 F (37.1 C) (Oral)   Resp 18   LMP 05/19/2020   SpO2 95%   Visual Acuity Right Eye Distance:   Left Eye Distance:   Bilateral Distance:    Right Eye Near:   Left Eye Near:    Bilateral Near:     Physical Exam Vitals and nursing note reviewed.  Constitutional:      Appearance: She is well-developed.     Comments: No acute distress  HENT:     Head: Normocephalic and atraumatic.     Ears:     Comments: Bilateral ears without tenderness to palpation of external auricle, tragus and mastoid, EAC's without erythema or swelling, TM's with good bony landmarks and cone of light. Non erythematous.     Nose: Nose normal.     Mouth/Throat:     Comments: Oral mucosa pink and moist, no tonsillar enlargement or exudate. Posterior pharynx patent and nonerythematous, no uvula deviation or swelling. Normal phonation. Eyes:     Conjunctiva/sclera: Conjunctivae normal.  Cardiovascular:     Rate and Rhythm: Normal rate.    Pulmonary:     Effort: Pulmonary effort is normal. No respiratory distress.     Comments: Breathing comfortably at rest, CTABL, no wheezing, rales or other  adventitious sounds auscultated Abdominal:     General: There is no distension.  Musculoskeletal:        General: Normal range of motion.     Cervical back: Neck supple.  Skin:    General: Skin is warm and dry.  Neurological:     Mental Status: She is alert and oriented to person, place, and time.      UC Treatments / Results  Labs (all labs ordered are listed, but only abnormal results are displayed) Labs Reviewed - No data to display  EKG   Radiology No results found.  Procedures Procedures (including critical care time)  Medications Ordered in UC Medications - No data to display  Initial Impression / Assessment and Plan / UC Course  I have reviewed the triage vital signs and the nursing notes.  Pertinent labs & imaging results that were available during my care of the patient were reviewed by me and considered in my medical decision making (see chart for details).     Recommending treatment for sinusitis, is already on clindamycin, but will provide doxycycline for empiric coverage as sinusitis, patient already on prednisone for underlying asthma, will have complete course of this.  Continue nasal sprays.  Rest and fluids.  Anti-inflammatories as needed.  Recommended probiotics given multiple antibiotics at this time.  Discussed strict return precautions. Patient verbalized understanding and is agreeable with plan.  Final Clinical Impressions(s) / UC Diagnoses   Final diagnoses:  Acute sinusitis with symptoms > 10 days     Discharge Instructions     Doxycycline prescribed Continue prednisone and albuterol  Continue nasal sprays Probiotics/yogurt to help with any diarrhea Follow up if not improving or worsening   ED Prescriptions    Medication Sig Dispense Auth. Provider   doxycycline (VIBRAMYCIN)  100 MG capsule Take 1 capsule (100 mg total) by mouth 2 (two) times daily for 7 days. 14 capsule Jerre Vandrunen C, PA-C   fluconazole (DIFLUCAN) 150 MG tablet Take 1 tablet (150 mg total) by mouth once for 1 dose. 2 tablet Lieutenant Abarca, [place] C, PA-C     PDMP not reviewed this encounter.   Lew Dawes, [place] 05/27/20 [phone]

## 2020-05-28 ENCOUNTER — Encounter: Payer: Self-pay | Admitting: Urology

## 2020-05-28 ENCOUNTER — Ambulatory Visit (INDEPENDENT_AMBULATORY_CARE_PROVIDER_SITE_OTHER): Payer: Self-pay | Admitting: Urology

## 2020-05-28 ENCOUNTER — Emergency Department (HOSPITAL_COMMUNITY): Payer: 59

## 2020-05-28 VITALS — BP 128/82 | HR 86 | Ht 59.0 in | Wt 208.0 lb

## 2020-05-28 DIAGNOSIS — R319 Hematuria, unspecified: Secondary | ICD-10-CM

## 2020-05-28 NOTE — Progress Notes (Signed)
05/28/2020 9:46 PM   Theresa Mooney 01-Nov-1980 [id number]  Referring provider: Arvilla Market, DO [address] [place],  [place] [address]  Chief Complaint  Patient presents with  . Hematuria    HPI: Theresa Mooney is a 39 y.o. female seen at the request of Marcy Siren, DO for evaluation of microhematuria.   States she was evaluated at [facility] in [place] and 2019 and had a negative cystoscopy  most recent UA's with microscopy have shown 21-50 RBCs 05/2019; 6-10 RBCs 04/30/2020; 6-10 RBCs 05/27/2020  Denies gross hematuria  Was recently having low back pain bilaterally radiating along the waist anteriorly  Noncontrast CT felt to show punctate bilateral nephrolithiasis  Neck pain felt to be musculoskeletal in etiology  No bothersome LUTS  History recurrent bacterial vaginosis   PMH: Past Medical History:  Diagnosis Date  . 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 Ectopic pregnancy   . Elevated glucose   . Herpes 2002  . Infection   . Mixed hyperlipidemia   . Nicotine dependence   . Trichimoniasis 12-2010  . Urinary calculi     Surgical History: Past Surgical History:  Procedure Laterality Date  . DILATION AND CURETTAGE OF UTERUS    . LAPAROSCOPY  11/27/2011   Procedure: LAPAROSCOPY OPERATIVE;  Surgeon: Catalina Antigua, MD;  Location: [facility];  Service: Gynecology;  Laterality: Bilateral;  . SALPINGECTOMY     Bilateral for twin ectopic preg.   . TUBAL LIGATION  12-05-2010    Home Medications:  Allergies as of 05/28/2020      Reactions   Hydrocodone Nausea Only   Dizziness      Medication List       Accurate as of May 28, 2020  9:46 PM. If you have any questions, ask your nurse or doctor.        albuterol 108 (90 Base) MCG/ACT inhaler Commonly known as: VENTOLIN HFA Inhale 2 puffs into the lungs every 6 (six) hours as needed for wheezing or shortness of breath.   clindamycin 300 MG capsule Commonly known as:  CLEOCIN Take 300 mg by mouth every 6 (six) hours.   Diclofenac Sodium CR 100 MG 24 hr tablet Take 1 tablet (100 mg total) by mouth daily.   doxycycline 100 MG capsule Commonly known as: VIBRAMYCIN Take 1 capsule (100 mg total) by mouth 2 (two) times daily for 7 days.   fluconazole 150 MG tablet Commonly known as: DIFLUCAN Take 150 mg by mouth once.   lidocaine 5 % Commonly known as: Lidoderm Place 1 patch onto the skin daily. Remove & Discard patch within 12 hours or as directed by MD   metroNIDAZOLE 500 MG tablet Commonly known as: Flagyl Take 1 tablet (500 mg total) by mouth 2 (two) times daily.   omeprazole 20 MG capsule Commonly known as: PRILOSEC Take 1 capsule (20 mg total) by mouth daily.   polyethylene glycol powder 17 GM/SCOOP powder Commonly known as: GLYCOLAX/MIRALAX Take 17 g by mouth daily.   predniSONE 20 MG tablet Commonly known as: DELTASONE Take 40 mg by mouth daily.   sucralfate 1 g tablet Commonly known as: Carafate Take 1 tablet (1 g total) by mouth 4 (four) times daily -  with meals and at bedtime.       Allergies:  Allergies  Allergen Reactions  . Hydrocodone Nausea Only    Dizziness    Family History: Family History  Problem Relation Age of Onset  . Hypertension Mother   .  Diabetes Maternal Grandmother   . Heart disease Maternal Grandmother   . Hypertension Maternal Grandmother   . Diabetes Maternal Grandfather   . Heart disease Maternal Grandfather   . Hypertension Maternal Grandfather   . Hyperlipidemia Daughter   . Breast cancer Maternal Aunt   . Anesthesia problems Neg Hx     Social History:  reports that she has never smoked. She has never used smokeless tobacco. She reports previous alcohol use. She reports that she does not use drugs.   Physical Exam: BP 128/82   Pulse 86   Ht 4\' 11"  (1.499 m)   Wt 208 lb (94.3 kg)   LMP 05/19/2020   BMI 42.01 kg/m   Constitutional:  Alert and oriented, No acute distress. HEENT:  [place] AT, moist mucus membranes.  Trachea midline, no masses. Cardiovascular: No clubbing, cyanosis, or edema. Respiratory: Normal respiratory effort, no increased work of breathing. GI: Abdomen is soft, nontender, nondistended, no abdominal masses GU: No CVA tenderness Skin: No rashes, bruises or suspicious lesions. Neurologic: Grossly intact, no focal deficits, moving all 4 extremities. Psychiatric: Normal mood and affect.    Pertinent Imaging: Images were personally reviewed and feel findings more in line with nephrocalcinosis  CT Renal Stone Study  Narrative CLINICAL DATA:  Bilateral flank pain  EXAM: CT ABDOMEN AND PELVIS WITHOUT CONTRAST  TECHNIQUE: Multidetector CT imaging of the abdomen and pelvis was performed following the standard protocol without IV contrast.  COMPARISON:  CT 11/27/2017  FINDINGS: Lower chest: No acute abnormality.  Hepatobiliary: No focal liver abnormality is seen. No gallstones, gallbladder wall thickening, or biliary dilatation.  Pancreas: Unremarkable. No pancreatic ductal dilatation or surrounding inflammatory changes.  Spleen: Normal in size without focal abnormality.  Adrenals/Urinary Tract: Adrenal glands are normal. Multiple punctate nonobstructing stones within the bilateral kidneys. No ureteral stone. Bladder is normal.  Stomach/Bowel: Stomach is within normal limits. Appendix appears normal. No evidence of bowel wall thickening, distention, or inflammatory changes.  Vascular/Lymphatic: No significant vascular findings are present. No enlarged abdominal or pelvic lymph nodes.  Reproductive: Uterus and bilateral adnexa are unremarkable.  Other: No abdominal wall hernia or abnormality. No abdominopelvic ascites.  Musculoskeletal: No acute or significant osseous findings.  IMPRESSION: Negative for hydronephrosis or ureteral stone. Multiple punctate nonobstructing stones within the bilateral kidneys.   Electronically  Signed By: 01/27/2018 M.D. On: 05/01/2020 01:16   Assessment & Plan:    1.  Microhematuria  Release signed to obtain [facility] records for review  Will schedule repeat cystoscopy  Will hold off on further upper tract imaging until prior urology records reviewed   07/01/2020, MD  [facility] [address], [address] [place], [place] [phone]

## 2020-05-28 NOTE — ED Notes (Signed)
Patient is discharged. Pt is ambulatory A&Ox4 with no concerns at this time.

## 2020-05-28 NOTE — Discharge Instructions (Addendum)
You were seen today for headache.  Your work-up is reassuring including your head CT.  Continue medications at home as prescribed.

## 2020-05-28 NOTE — ED Provider Notes (Signed)
[facility]-EMERGENCY DEPT Provider Note   CSN: [id number] Arrival date & time: 05/27/20  1712     History Chief Complaint  Patient presents with  . Headache  . Otalgia    Theresa Mooney is a 39 y.o. female.  HPI     This is a 39 year old female who presents with headache and otalgia.  Patient reports 2-week history of worsening headache.  She reports bifrontal headache that radiates into her occipital region.  She states that her scalp is very sensitive.  She initially thought it was related to a tooth ache but had her wisdom tooth pulled on Monday without improvement.  She is on several medications including clindamycin, doxycycline, and prednisone for sinusitis, dental infection, and ongoing asthma issues.  Patient states she has taken multiple over-the-counter medications for her headache without relief.  She has a history of headaches but states that this is more continuous than normal.  She is not noted any neck stiffness or fevers.  Denies strokelike symptoms including weakness, numbness, speech difficulty, vision changes.  She rates her pain at 10 out of 10.  Past Medical History:  Diagnosis Date  . 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 Ectopic pregnancy   . Elevated glucose   . Herpes 2002  . Infection   . Mixed hyperlipidemia   . Nicotine dependence   . Trichimoniasis 12-2010  . Urinary calculi     Patient Active Problem List   Diagnosis Date Noted  . h/o postpartum BTL  12/13/2011  . S/P ectopic pregnancy 12/13/2011    Past Surgical History:  Procedure Laterality Date  . DILATION AND CURETTAGE OF UTERUS    . LAPAROSCOPY  11/27/2011   Procedure: LAPAROSCOPY OPERATIVE;  Surgeon: Catalina Antigua, MD;  Location: [facility];  Service: Gynecology;  Laterality: Bilateral;  . SALPINGECTOMY     Bilateral for twin ectopic preg.   . TUBAL LIGATION  12-05-2010     OB History    Gravida  7   Para  5   Term      Preterm      AB  2   Living   5     SAB      TAB  1   Ectopic  1   Multiple      Live Births  1           Family History  Problem Relation Age of Onset  . Hypertension Mother   . Diabetes Maternal Grandmother   . Heart disease Maternal Grandmother   . Hypertension Maternal Grandmother   . Diabetes Maternal Grandfather   . Heart disease Maternal Grandfather   . Hypertension Maternal Grandfather   . Hyperlipidemia Daughter   . Breast cancer Maternal Aunt   . Anesthesia problems Neg Hx     Social History   Tobacco Use  . Smoking status: Never Smoker  . Smokeless tobacco: Never Used  Vaping Use  . Vaping Use: Never used  Substance Use Topics  . Alcohol use: Not Currently    Comment: social  . Drug use: No    Home Medications Prior to Admission medications   Medication Sig Start Date End Date Taking? Authorizing Provider  albuterol (VENTOLIN HFA) 108 (90 Base) MCG/ACT inhaler Inhale 2 puffs into the lungs every 6 (six) hours as needed for wheezing or shortness of breath. 05/05/20   Arvilla Market, DO  Diclofenac Sodium CR 100 MG 24 hr tablet Take 1 tablet (100 mg  total) by mouth daily. 05/01/20   Palumbo, April, MD  doxycycline (VIBRAMYCIN) 100 MG capsule Take 1 capsule (100 mg total) by mouth 2 (two) times daily for 7 days. 05/26/20 06/02/20  Wieters, Hallie C, PA-C  lidocaine (LIDODERM) 5 % Place 1 patch onto the skin daily. Remove & Discard patch within 12 hours or as directed by MD 05/01/20   Nicanor Alcon, April, MD  metroNIDAZOLE (FLAGYL) 500 MG tablet Take 1 tablet (500 mg total) by mouth 2 (two) times daily. 05/05/20   Arvilla Market, DO  omeprazole (PRILOSEC) 20 MG capsule Take 1 capsule (20 mg total) by mouth daily. 05/19/20   Robinson, [place] N, PA-C  polyethylene glycol powder (GLYCOLAX/MIRALAX) 17 GM/SCOOP powder Take 17 g by mouth daily. 05/05/20   Arvilla Market, DO  sucralfate (CARAFATE) 1 g tablet Take 1 tablet (1 g total) by mouth 4 (four) times daily -  with meals  and at bedtime. 05/07/20   Arvilla Market, DO    Allergies    Hydrocodone  Review of Systems   Review of Systems  Constitutional: Negative for fever.  Respiratory: Negative for shortness of breath.   Cardiovascular: Negative for chest pain.  Gastrointestinal: Negative for nausea and vomiting.  Genitourinary: Positive for dysuria.  Neurological: Positive for headaches. Negative for dizziness, weakness and numbness.  All other systems reviewed and are negative.   Physical Exam Updated Vital Signs BP 136/70   Pulse 64   Temp 98.2 F (36.8 C)   Resp 18   Ht 1.499 m (4\' 11" )   Wt 94.3 kg   LMP 05/19/2020   SpO2 99%   BMI 42.01 kg/m   Physical Exam Vitals and nursing note reviewed.  Constitutional:      Appearance: She is well-developed. She is not ill-appearing.  HENT:     Head: Normocephalic and atraumatic.     Mouth/Throat:     Mouth: Mucous membranes are moist.     Comments: No trismus, no swelling within the mouth Eyes:     Pupils: Pupils are equal, round, and reactive to light.  Neck:     Comments: No meningismus Cardiovascular:     Rate and Rhythm: Normal rate and regular rhythm.     Heart sounds: Normal heart sounds.  Pulmonary:     Effort: Pulmonary effort is normal. No respiratory distress.     Breath sounds: No wheezing.  Abdominal:     General: Bowel sounds are normal.     Palpations: Abdomen is soft.  Musculoskeletal:     Cervical back: Normal range of motion and neck supple.  Skin:    General: Skin is warm and dry.  Neurological:     Mental Status: She is alert and oriented to person, place, and time.     Comments: Cranial nerves II through XII intact, 5 out of 5 strength in all 4 extremities, no dysmetria to finger-nose-finger  Psychiatric:        Mood and Affect: Mood normal.     ED Results / Procedures / Treatments   Labs (all labs ordered are listed, but only abnormal results are displayed) Labs Reviewed  URINALYSIS, ROUTINE W  REFLEX MICROSCOPIC - Abnormal; Notable for the following components:      Result Value   APPearance CLOUDY (*)    Hgb urine dipstick LARGE (*)    Ketones, ur 20 (*)    Leukocytes,Ua TRACE (*)    Bacteria, UA FEW (*)    All other components within  normal limits    EKG None  Radiology CT Head Wo Contrast  Result Date: 05/28/2020 CLINICAL DATA:  Headache and migraine EXAM: CT HEAD WITHOUT CONTRAST TECHNIQUE: Contiguous axial images were obtained from the base of the skull through the vertex without intravenous contrast. COMPARISON:  None. FINDINGS: Brain: No evidence of acute territorial infarction, hemorrhage, hydrocephalus,extra-axial collection or mass lesion/mass effect. Normal gray-white differentiation. Ventricles are normal in size and contour. Vascular: No hyperdense vessel or unexpected calcification. Skull: The skull is intact. No fracture or focal lesion identified. Sinuses/Orbits: The visualized paranasal sinuses and mastoid air cells are clear. The orbits and globes intact. Other: None IMPRESSION: No acute intracranial abnormality. Electronically Signed   By: Jonna Clark M.D.   On: 05/28/2020 01:24    Procedures Procedures (including critical care time)  Medications Ordered in ED Medications  ketorolac (TORADOL) 30 MG/ML injection 30 mg (30 mg Intramuscular Given 05/27/20 2346)  promethazine (PHENERGAN) injection 25 mg (25 mg Intramuscular Given 05/27/20 2351)    ED Course  I have reviewed the triage vital signs and the nursing notes.  Pertinent labs & imaging results that were available during my care of the patient were reviewed by me and considered in my medical decision making (see chart for details).  Clinical Course as of May 28 140  Wed May 28, 2020  0050 Patient somewhat improved after migraine cocktail.  However, does have some persistent pain and scalp sensitivity.  No lesions noted on the scalp.  She is requesting imaging to rule out a tumor."  Have low  suspicion given her reassuring exam that this would be high yield.  However, will obtain CT head.   [CH]    Clinical Course User Index [CH] Annalise Mcdiarmid, Mayer Masker, MD   MDM Rules/Calculators/A&P                           Patient presents with headache.  Waxing and waning over the last 2 weeks.  She is overall nontoxic and vital signs are reassuring.  Neurologic exam is normal.  She is on multiple medications right now for sinusitis as well as dental infections.  She has no signs or symptoms of meningitis.  Low suspicion for subarachnoid hemorrhage.  She reports hyperesthesia of the scalp but there are no scalp lesions noted.  She was given a migraine cocktail with some improvement.  She is requesting imaging to rule out tumor.  Given her reassuring exam, would have low suspicion for tumor; however, will obtain CT given patient's concerns.  CT reviewed by myself and shows no evidence of intracranial pathology.  Urinalysis was also obtained as patient felt like she may have a urinary tract infection.  No obvious UTI.  Will refer to outpatient neurology.  After history, exam, and medical workup I feel the patient has been appropriately medically screened and is safe for discharge home. Pertinent diagnoses were discussed with the patient. Patient was given return precautions.   Final Clinical Impression(s) / ED Diagnoses Final diagnoses:  Bad headache    Rx / DC Orders ED Discharge Orders    None       Makinzie Considine, Mayer Masker, MD 05/28/20 [phone]

## 2020-06-02 ENCOUNTER — Encounter: Payer: Self-pay | Admitting: Urology

## 2020-06-06 ENCOUNTER — Encounter (HOSPITAL_COMMUNITY): Payer: Self-pay

## 2020-06-06 ENCOUNTER — Other Ambulatory Visit: Payer: Self-pay

## 2020-06-06 DIAGNOSIS — N760 Acute vaginitis: Secondary | ICD-10-CM | POA: Insufficient documentation

## 2020-06-06 DIAGNOSIS — R109 Unspecified abdominal pain: Secondary | ICD-10-CM | POA: Insufficient documentation

## 2020-06-06 DIAGNOSIS — Z79899 Other long term (current) drug therapy: Secondary | ICD-10-CM | POA: Insufficient documentation

## 2020-06-06 LAB — URINALYSIS, ROUTINE W REFLEX MICROSCOPIC
Bilirubin Urine: NEGATIVE
Glucose, UA: NEGATIVE mg/dL
Ketones, ur: NEGATIVE mg/dL
Leukocytes,Ua: NEGATIVE
Nitrite: NEGATIVE
Protein, ur: NEGATIVE mg/dL
Specific Gravity, Urine: 1.019 (ref 1.005–1.030)
pH: 5 (ref 5.0–8.0)

## 2020-06-06 LAB — POC URINE PREG, ED: Preg Test, Ur: NEGATIVE

## 2020-06-06 NOTE — ED Triage Notes (Signed)
Pt reports bilateral flank pain. States that she was being treated for a UTI and finished her antibiotics this week. She states that the pressure with urination is gone, but the flank and back pain has worsened. A&Ox4. No fevers  Administrator

## 2020-06-07 ENCOUNTER — Emergency Department (HOSPITAL_COMMUNITY)
Admission: EM | Admit: 2020-06-07 | Discharge: 2020-06-07 | Disposition: A | Discharge status: Home / Self Care | Payer: 59 | Attending: Emergency Medicine | Admitting: Emergency Medicine

## 2020-06-07 ENCOUNTER — Encounter (HOSPITAL_COMMUNITY): Payer: Self-pay | Admitting: Emergency Medicine

## 2020-06-07 ENCOUNTER — Other Ambulatory Visit: Payer: Self-pay

## 2020-06-07 ENCOUNTER — Emergency Department (HOSPITAL_COMMUNITY)
Admission: EM | Admit: 2020-06-07 | Discharge: 2020-06-08 | Disposition: A | Discharge status: Home / Self Care | Payer: 59 | Source: Home / Self Care | Attending: Emergency Medicine | Admitting: Emergency Medicine

## 2020-06-07 DIAGNOSIS — Z79899 Other long term (current) drug therapy: Secondary | ICD-10-CM | POA: Insufficient documentation

## 2020-06-07 DIAGNOSIS — B9689 Other specified bacterial agents as the cause of diseases classified elsewhere: Secondary | ICD-10-CM

## 2020-06-07 DIAGNOSIS — N760 Acute vaginitis: Secondary | ICD-10-CM | POA: Insufficient documentation

## 2020-06-07 LAB — WET PREP, GENITAL
Clue Cells Wet Prep HPF POC: NONE SEEN
Sperm: NONE SEEN
Trich, Wet Prep: NONE SEEN
Yeast Wet Prep HPF POC: NONE SEEN

## 2020-06-07 LAB — BASIC METABOLIC PANEL
Anion gap: 11 (ref 5–15)
BUN: 12 mg/dL (ref 6–20)
CO2: 25 mmol/L (ref 22–32)
Calcium: 9.4 mg/dL (ref 8.9–10.3)
Chloride: 102 mmol/L (ref 98–111)
Creatinine, Ser: 0.98 mg/dL (ref 0.44–1.00)
GFR calc Af Amer: >60 mL/min (ref >60)
GFR calc non Af Amer: >60 mL/min (ref >60)
Glucose, Bld: 124 mg/dL — ABNORMAL HIGH (ref 70–99)
Potassium: 3.7 mmol/L (ref 3.5–5.1)
Sodium: 138 mmol/L (ref 135–145)

## 2020-06-07 LAB — I-STAT BETA HCG BLOOD, ED (MC, WL, AP ONLY): I-stat hCG, quantitative: <5 m[IU]/mL (ref <5)

## 2020-06-07 LAB — CBC
HCT: 37.5 % (ref 36.0–46.0)
Hemoglobin: 11.8 g/dL — ABNORMAL LOW (ref 12.0–15.0)
MCH: 28.3 pg (ref 26.0–34.0)
MCHC: 31.5 g/dL (ref 30.0–36.0)
MCV: 89.9 fL (ref 80.0–100.0)
Platelets: 264 10*3/uL (ref 150–400)
RBC: 4.17 MIL/uL (ref 3.87–5.11)
RDW: 14.1 % (ref 11.5–15.5)
WBC: 5.2 10*3/uL (ref 4.0–10.5)
nRBC: 0 % (ref 0.0–0.2)

## 2020-06-07 MED ORDER — CLINDAMYCIN HCL 150 MG PO CAPS: 300.0000 mg | ORAL_CAPSULE | Freq: Two times a day (BID) | ORAL | 0 refills | Status: AC

## 2020-06-07 MED ORDER — METRONIDAZOLE 500 MG PO TABS: 750.0000 mg | ORAL_TABLET | Freq: Every day | ORAL | 0 refills | Status: AC

## 2020-06-07 MED ORDER — NAPROXEN 375 MG PO TABS: 375.0000 mg | ORAL_TABLET | Freq: Two times a day (BID) | ORAL | 0 refills | Status: DC

## 2020-06-07 NOTE — ED Provider Notes (Signed)
[facility]-EMERGENCY DEPT Provider Note   CSN: [id number] Arrival date & time: 06/07/20  1200     History Chief Complaint  Patient presents with  . Flank Pain    Theresa Mooney is a 39 y.o. female.  HPI    Patient presented to the ED for evaluation of flank pain and some lower abdominal pain.  Patient states she started having symptoms last week and she was treated for UTI.  Patient finished the antibiotics but she still has noticed some urinary discomfort.  She is also started having some pain in her back.  She has not had any fevers.  She is not had any vomiting or diarrhea.  Patient has had some vaginal discharge.  She went to the ED last night but left without being seen.  She did have a urinalysis that did not show any signs of infection  Past Medical History:  Diagnosis Date  . 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 Ectopic pregnancy   . Elevated glucose   . Herpes 2002  . Infection   . Mixed hyperlipidemia   . Nicotine dependence   . Trichimoniasis 12-2010  . Urinary calculi     Patient Active Problem List   Diagnosis Date Noted  . h/o postpartum BTL  12/13/2011  . S/P ectopic pregnancy 12/13/2011    Past Surgical History:  Procedure Laterality Date  . DILATION AND CURETTAGE OF UTERUS    . LAPAROSCOPY  11/27/2011   Procedure: LAPAROSCOPY OPERATIVE;  Surgeon: Catalina Antigua, MD;  Location: [facility];  Service: Gynecology;  Laterality: Bilateral;  . SALPINGECTOMY     Bilateral for twin ectopic preg.   . TUBAL LIGATION  12-05-2010     OB History    Gravida  7   Para  5   Term      Preterm      AB  2   Living  5     SAB      TAB  1   Ectopic  1   Multiple      Live Births  1           Family History  Problem Relation Age of Onset  . Hypertension Mother   . Diabetes Maternal Grandmother   . Heart disease Maternal Grandmother   . Hypertension Maternal Grandmother   . Diabetes Maternal Grandfather   . Heart  disease Maternal Grandfather   . Hypertension Maternal Grandfather   . Hyperlipidemia Daughter   . Breast cancer Maternal Aunt   . Anesthesia problems Neg Hx     Social History   Tobacco Use  . Smoking status: Never Smoker  . Smokeless tobacco: Never Used  Vaping Use  . Vaping Use: Never used  Substance Use Topics  . Alcohol use: Not Currently    Comment: social  . Drug use: No    Home Medications Prior to Admission medications   Medication Sig Start Date End Date Taking? Authorizing Provider  albuterol (VENTOLIN HFA) 108 (90 Base) MCG/ACT inhaler Inhale 2 puffs into the lungs every 6 (six) hours as needed for wheezing or shortness of breath. 05/05/20   Arvilla Market, DO  clindamycin (CLEOCIN) 300 MG capsule Take 300 mg by mouth every 6 (six) hours. 05/24/20   [provider]  Diclofenac Sodium CR 100 MG 24 hr tablet Take 1 tablet (100 mg total) by mouth daily. 05/01/20   Palumbo, April, MD  fluconazole (DIFLUCAN) 150 MG tablet Take 150  mg by mouth once. 05/26/20   [provider]  lidocaine (LIDODERM) 5 % Place 1 patch onto the skin daily. Remove & Discard patch within 12 hours or as directed by MD 05/01/20   Nicanor Alcon, April, MD  metroNIDAZOLE (FLAGYL) 500 MG tablet Take 1.5 tablets (750 mg total) by mouth daily for 7 days. 06/07/20 06/14/20  Linwood Dibbles, MD  naproxen (NAPROSYN) 375 MG tablet Take 1 tablet (375 mg total) by mouth 2 (two) times daily. 06/07/20   Linwood Dibbles, MD  omeprazole (PRILOSEC) 20 MG capsule Take 1 capsule (20 mg total) by mouth daily. 05/19/20   Robinson, [place] N, PA-C  polyethylene glycol powder (GLYCOLAX/MIRALAX) 17 GM/SCOOP powder Take 17 g by mouth daily. 05/05/20   Arvilla Market, DO  predniSONE (DELTASONE) 20 MG tablet Take 40 mg by mouth daily. 05/24/20   [provider]  sucralfate (CARAFATE) 1 g tablet Take 1 tablet (1 g total) by mouth 4 (four) times daily -  with meals and at bedtime. 05/07/20   Arvilla Market, DO    Allergies    Hydrocodone and Penicillins  Review of Systems   Review of Systems  All other systems reviewed and are negative.   Physical Exam Updated Vital Signs BP 99/83   Pulse 89   Temp 99.4 F (37.4 C) (Oral)   Resp 16   Ht 1.499 m (4\' 11" )   Wt 92.1 kg   LMP 05/19/2020   SpO2 99%   BMI 41.00 kg/m   Physical Exam Vitals and nursing note reviewed.  Constitutional:      General: She is not in acute distress.    Appearance: She is well-developed.  HENT:     Head: Normocephalic and atraumatic.     Right Ear: External ear normal.     Left Ear: External ear normal.  Eyes:     General: No scleral icterus.       Right eye: No discharge.        Left eye: No discharge.     Conjunctiva/sclera: Conjunctivae normal.  Neck:     Trachea: No tracheal deviation.  Cardiovascular:     Rate and Rhythm: Normal rate and regular rhythm.  Pulmonary:     Effort: Pulmonary effort is normal. No respiratory distress.     Breath sounds: Normal breath sounds. No stridor. No wheezing or rales.  Abdominal:     General: Bowel sounds are normal. There is no distension.     Palpations: Abdomen is soft.     Tenderness: There is no abdominal tenderness. There is no guarding or rebound.  Genitourinary:    Vagina: Vaginal discharge present.     Comments: Whitish vaginal discharge, no cervical motion tenderness Musculoskeletal:        General: No tenderness.     Cervical back: Neck supple.  Skin:    General: Skin is warm and dry.     Findings: No rash.  Neurological:     Mental Status: She is alert.     Cranial Nerves: No cranial nerve deficit (no facial droop, extraocular movements intact, no slurred speech).     Sensory: No sensory deficit.     Motor: No abnormal muscle tone or seizure activity.     Coordination: Coordination normal.     ED Results / Procedures / Treatments   Labs (all labs ordered are listed, but only abnormal results are displayed) Labs Reviewed    WET PREP, GENITAL - Abnormal; Notable for the following  components:      Result Value   WBC, Wet Prep HPF POC FEW (*)    All other components within normal limits  BASIC METABOLIC PANEL - Abnormal; Notable for the following components:   Glucose, Bld 124 (*)    All other components within normal limits  CBC - Abnormal; Notable for the following components:   Hemoglobin 11.8 (*)    All other components within normal limits  URINALYSIS, ROUTINE W REFLEX MICROSCOPIC  RPR  HIV ANTIBODY (ROUTINE TESTING W REFLEX)  I-STAT BETA HCG BLOOD, ED ([facility], [facility], [facility] ONLY)  GC/CHLAMYDIA PROBE AMP ([place]) NOT AT [facility]     Procedures Procedures (including critical care time)  Medications Ordered in ED Medications - No data to display  ED Course  I have reviewed the triage vital signs and the nursing notes.  Pertinent labs & imaging results that were available during my care of the patient were reviewed by me and considered in my medical decision making (see chart for details).    MDM Rules/Calculators/A&P                         Patient presented to the ED for evaluation of flank pain.  Patient had a urinalysis last evening that does not show infection.  Patient symptoms are not suggestive of ureteral colic.  Patient was concerned about vaginal discharge.  On exam she did have thick white discharge.  Symptoms not suggestive of PID.  She does have white blood cells noted on wet prep.  I will treat her for bacterial vaginosis.  Recommend outpatient follow-up with primary care doctor.  Final Clinical Impression(s) / ED Diagnoses Final diagnoses:  Bacterial vaginosis    Rx / DC Orders ED Discharge Orders         Ordered    metroNIDAZOLE (FLAGYL) 500 MG tablet  Daily        06/07/20 2325    naproxen (NAPROSYN) 375 MG tablet  2 times daily        06/07/20 2325           Linwood Dibbles, MD 06/07/20 2329

## 2020-06-07 NOTE — ED Notes (Signed)
I called patient to recheck her vitals and no one responded 

## 2020-06-07 NOTE — Discharge Instructions (Signed)
Take the metronidazole on an empty stomach.  Avoid any alcohol while taking the medications.  Take the naprosyn as needed for pain.  Follow up with a primary care doctor or GYN doctor

## 2020-06-07 NOTE — ED Triage Notes (Addendum)
Pt reports bilateral flank pain. States that she was being treated for a UTI and finished her antibiotics this week. She states that the pressure with urination is gone, but the flank and back pain has worsened. A&Ox4. No fever. States that she was here last night but left. Patient reports that they did take urine sample last night but no blood work.

## 2020-06-08 LAB — RPR: RPR Ser Ql: NONREACTIVE

## 2020-06-08 LAB — HIV ANTIBODY (ROUTINE TESTING W REFLEX): HIV Screen 4th Generation wRfx: NONREACTIVE

## 2020-06-09 LAB — GC/CHLAMYDIA PROBE AMP (~~LOC~~) NOT AT ARMC
Chlamydia: NEGATIVE
Comment: NEGATIVE
Comment: NORMAL
Neisseria Gonorrhea: NEGATIVE

## 2020-06-18 NOTE — Progress Notes (Signed)
   06/19/2020  CC:  Chief Complaint  Patient presents with  . Cysto    HPI: Theresa Mooney is a 39 y.o. female who is seen today for cystoscopy.    States she was evaluated at [facility] in [place] and 2019 and had a negative cystoscopy  most recent UA's with microscopy have shown 21-50 RBCs 05/2019; 6-10 RBCs 04/30/2020; 6-10 RBCs 05/27/2020  Denies gross hematuria  Was recently having low back pain bilaterally radiating along the waist anteriorly  Noncontrast CT felt to show punctate bilateral nephrolithiasis  Neck pain felt to be musculoskeletal in etiology  No bothersome LUTS  History recurrent bacterial vaginosis  She was seen in the ED on 06/07/20 for flank, back and lower abdominal pain.  After being treated for UTI with antibiotics she noticed urinary discomfort.   Wet prep noted WBCs.  Patient was treated for BV with Flagyl 500 mg and naprosyin 375 mg.    Blood pressure (!) 142/84, pulse (!) 109, height 4\' 11"  (1.499 m), weight 203 lb (92.1 kg). NED. A&Ox3.   No respiratory distress   Abd soft, NT, ND Normal external genitalia with patent urethral meatus  Cystoscopy Procedure Note  Patient identification was confirmed, informed consent was obtained, and patient was prepped using Betadine solution.  Lidocaine jelly was administered per urethral meatus.    Procedure: - Flexible cystoscope introduced, without any difficulty.   - Thorough search of the bladder revealed:    normal urethral meatus    normal urothelium    no stones    no ulcers     no tumors    no urethral polyps    no trabeculation  - Ureteral orifices were normal in position and appearance.  Post-Procedure: - Patient tolerated the procedure well  Assessment/ Plan:  1. Microhematuria  No significant mucosal abnormalities on cystoscopy Have not received [facility] records and will re-request Follow up with PA in 6 months for symptoms recheck and UA.   ,  am acting as a scribe for Dr. Georges Mouse C. Chamia Schmutz,  I have reviewed the above documentation for accuracy and completeness, and I agree with the above.   Lorin Picket, MD

## 2020-06-19 ENCOUNTER — Ambulatory Visit (INDEPENDENT_AMBULATORY_CARE_PROVIDER_SITE_OTHER): Payer: Self-pay | Admitting: Urology

## 2020-06-19 ENCOUNTER — Encounter: Payer: Self-pay | Admitting: Urology

## 2020-06-19 ENCOUNTER — Other Ambulatory Visit: Payer: Self-pay

## 2020-06-19 VITALS — BP 142/84 | HR 109 | Ht 59.0 in | Wt 203.0 lb

## 2020-06-19 DIAGNOSIS — R319 Hematuria, unspecified: Secondary | ICD-10-CM

## 2020-06-20 ENCOUNTER — Encounter: Payer: Self-pay | Admitting: Urology

## 2020-06-20 LAB — URINALYSIS, COMPLETE
Bilirubin, UA: NEGATIVE
Glucose, UA: NEGATIVE
Ketones, UA: NEGATIVE
Leukocytes,UA: NEGATIVE
Nitrite, UA: NEGATIVE
Protein,UA: NEGATIVE
Specific Gravity, UA: 1.02 (ref 1.005–1.030)
Urobilinogen, Ur: 0.2 mg/dL (ref 0.2–1.0)
pH, UA: 5 (ref 5.0–7.5)

## 2020-06-20 LAB — MICROSCOPIC EXAMINATION

## 2020-06-23 ENCOUNTER — Telehealth (INDEPENDENT_AMBULATORY_CARE_PROVIDER_SITE_OTHER): Payer: 59 | Admitting: Physician Assistant

## 2020-06-23 ENCOUNTER — Encounter: Payer: Self-pay | Admitting: Internal Medicine

## 2020-06-23 DIAGNOSIS — G43909 Migraine, unspecified, not intractable, without status migrainosus: Secondary | ICD-10-CM | POA: Diagnosis not present

## 2020-06-23 DIAGNOSIS — E559 Vitamin D deficiency, unspecified: Secondary | ICD-10-CM

## 2020-06-23 MED ORDER — SUMATRIPTAN SUCCINATE 50 MG PO TABS: 50.0000 mg | ORAL_TABLET | ORAL | 0 refills | Status: DC | PRN

## 2020-06-23 NOTE — Progress Notes (Signed)
Established Patient Office Visit  Subjective:  Patient ID: Theresa Mooney, female    DOB: October 03, 1980  Age: 39 y.o. MRN: [id number]  CC:  Chief Complaint  Patient presents with  . Migraine    x 2 months. having c/o sensitivity to lights & shiners. denies nausea, vomiting, sensitivity to sounds/smells.     Virtual Visit via Telephone Note  I connected with Theresa Mooney on 06/23/20 at  2:30 PM EDT by telephone and verified that I am speaking with the correct person using two identifiers.  Location: Patient: Home Provider: Working Engineer, structuralemotely from home   I discussed the limitations, risks, security and privacy concerns of performing an evaluation and management service by telephone and the availability of in person appointments. I also discussed with the patient that there may be a patient responsible charge related to this service. The patient expressed understanding and agreed to proceed.   History of Present Illness: Reports that she has been suffering from migraines for the past two months.  Lights make it worse, denies n/v.  Reports several visits to emergency department, urgent care, [facility].  Reports that she has been taking sinus medication without much relief, states that she did use Macrobid with some relief.  Reports prior to 2 months ago she did not have migraines, would have occasional headaches.  Describes the migraines as pain on both sides, front of face and top of head.  Reports that she has several a day some days, will occasionally wake up with one. Salt makes it worse, will have some dizziness when she bends over.   States that she has been unable to get an eye exam due to financial constraints.  Reports that she previously wore over-the-counter colored contacts and used to sleep in them, denies any eye irritation or discharge.  Denies blurry vision but does endorse extended screen time on a daily basis.  Reports that she recently had 2 teeth pulled  but was referred to a different dentist for other dental work that needed to be done.  Denies current dental pain.  Reports sleep is good, endorses low water intake, does not take any daily vitamins  Reports significant history of acid reflux, recent endoscopy, is followed by GI, is taking Prilosec twice a day     Observations/Objective: Medical history and current medications reviewed, no physical exam completed    Past Medical History:  Diagnosis Date  . 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 Ectopic pregnancy   . Elevated glucose   . Herpes 2002  . Infection   . Mixed hyperlipidemia   . Nicotine dependence   . Trichimoniasis 12-2010  . Urinary calculi     Past Surgical History:  Procedure Laterality Date  . DILATION AND CURETTAGE OF UTERUS    . LAPAROSCOPY  11/27/2011   Procedure: LAPAROSCOPY OPERATIVE;  Surgeon: Catalina AntiguaPeggy Constant, MD;  Location: [facility];  Service: Gynecology;  Laterality: Bilateral;  . SALPINGECTOMY     Bilateral for twin ectopic preg.   . TUBAL LIGATION  12-05-2010    Family History  Problem Relation Age of Onset  . Hypertension Mother   . Diabetes Maternal Grandmother   . Heart disease Maternal Grandmother   . Hypertension Maternal Grandmother   . Diabetes Maternal Grandfather   . Heart disease Maternal Grandfather   . Hypertension Maternal Grandfather   . Hyperlipidemia Daughter   . Breast cancer Maternal Aunt   . Anesthesia problems Neg Hx  Social History   Socioeconomic History  . Marital status: Single    Spouse name: Not on file  . Number of children: Not on file  . Years of education: Not on file  . Highest education level: Not on file  Occupational History  . Not on file  Tobacco Use  . Smoking status: Never Smoker  . Smokeless tobacco: Never Used  Vaping Use  . Vaping Use: Never used  Substance and Sexual Activity  . Alcohol use: Not Currently    Comment: social  . Drug use: No  . Sexual activity: Yes    Birth  control/protection: Surgical  Other Topics Concern  . Not on file  Social History Narrative  . Not on file   Social Determinants of Health   Financial Resource Strain:   . Difficulty of Paying Living Expenses: Not on file  Food Insecurity:   . Worried About Programme researcher, broadcasting/film/video in the Last Year: Not on file  . Ran Out of Food in the Last Year: Not on file  Transportation Needs:   . Lack of Transportation (Medical): Not on file  . Lack of Transportation (Non-Medical): Not on file  Physical Activity:   . Days of Exercise per Week: Not on file  . Minutes of Exercise per Session: Not on file  Stress:   . Feeling of Stress : Not on file  Social Connections:   . Frequency of Communication with Friends and Family: Not on file  . Frequency of Social Gatherings with Friends and Family: Not on file  . Attends Religious Services: Not on file  . Active Member of Clubs or Organizations: Not on file  . Attends Banker Meetings: Not on file  . Marital Status: Not on file  Intimate Partner Violence:   . Fear of Current or Ex-Partner: Not on file  . Emotionally Abused: Not on file  . Physically Abused: Not on file  . Sexually Abused: Not on file    Outpatient Medications Prior to Visit  Medication Sig Dispense Refill  . albuterol (VENTOLIN HFA) 108 (90 Base) MCG/ACT inhaler Inhale 2 puffs into the lungs every 6 (six) hours as needed for wheezing or shortness of breath. 18 g 1  . Alum & Mag Hydroxide-Simeth (GI COCKTAIL) SUSP suspension Take 15-30 mLs by mouth every 2 (two) hours as needed.    Marland Kitchen ibuprofen (ADVIL) 800 MG tablet Take 800 mg by mouth every 8 (eight) hours as needed. for pain    . omeprazole (PRILOSEC) 20 MG capsule Take 1 capsule (20 mg total) by mouth daily. 14 capsule 0  . polyethylene glycol powder (GLYCOLAX/MIRALAX) 17 GM/SCOOP powder Take 17 g by mouth daily. 3350 g 1  . sucralfate (CARAFATE) 1 g tablet Take 1 tablet (1 g total) by mouth 4 (four) times daily -   with meals and at bedtime. 60 tablet 1  . sulfamethoxazole-trimethoprim (BACTRIM DS) 800-160 MG tablet Take 1 tablet by mouth 2 (two) times daily.    . Diclofenac Sodium CR 100 MG 24 hr tablet Take 1 tablet (100 mg total) by mouth daily. 10 tablet 0  . fluconazole (DIFLUCAN) 150 MG tablet Take 150 mg by mouth once.    . lidocaine (LIDODERM) 5 % Place 1 patch onto the skin daily. Remove & Discard patch within 12 hours or as directed by MD 30 patch 0  . naproxen (NAPROSYN) 375 MG tablet Take 1 tablet (375 mg total) by mouth 2 (two) times daily. 20 tablet  0  . predniSONE (DELTASONE) 20 MG tablet Take 40 mg by mouth daily.     No facility-administered medications prior to visit.    Allergies  Allergen Reactions  . Hydrocodone Nausea Only    Dizziness  . Penicillins     Did it involve swelling of the face/tongue/throat, SOB, or low BP? Y Did it involve sudden or severe rash/hives, skin peeling, or any reaction on the inside of your mouth or nose? N Did you need to seek medical attention at a hospital or doctor's office? Y When did it last happen?Over 1 month If all above answers are "NO", may proceed with cephalosporin use.      ROS Review of Systems  Constitutional: Negative for chills and fever.  HENT: Negative for ear pain, sinus pressure and sore throat.   Eyes: Positive for photophobia. Negative for visual disturbance.  Respiratory: Negative.   Gastrointestinal: Negative for nausea and vomiting.  Endocrine: Negative.   Genitourinary: Negative.   Musculoskeletal: Negative.   Skin: Negative.   Allergic/Immunologic: Negative.   Neurological: Positive for dizziness and headaches.  Hematological: Negative.   Psychiatric/Behavioral: Negative.       Objective:     There were no vitals taken for this visit. Wt Readings from Last 3 Encounters:  06/19/20 203 lb (92.1 kg)  06/07/20 203 lb (92.1 kg)  06/06/20 203 lb (92.1 kg)     Health Maintenance Due  Topic Date Due   . COVID-19 Vaccine (1) Never done  . INFLUENZA VACCINE  Never done    There are no preventive care reminders to display for this patient.  Lab Results  Component Value Date   TSH 2.554 06/09/2018   Lab Results  Component Value Date   WBC 5.2 06/07/2020   HGB 11.8 (L) 06/07/2020   HCT 37.5 06/07/2020   MCV 89.9 06/07/2020   PLT 264 06/07/2020   Lab Results  Component Value Date   NA 138 06/07/2020   K 3.7 06/07/2020   CO2 25 06/07/2020   GLUCOSE 124 (H) 06/07/2020   BUN 12 06/07/2020   CREATININE 0.98 06/07/2020   BILITOT 0.5 04/30/2020   ALKPHOS 75 04/30/2020   AST 15 04/30/2020   ALT 13 04/30/2020   PROT 7.4 04/30/2020   ALBUMIN 4.0 04/30/2020   CALCIUM 9.4 06/07/2020   ANIONGAP 11 06/07/2020   Lab Results  Component Value Date   CHOL 198 10/22/2019   Lab Results  Component Value Date   HDL 79 10/22/2019   Lab Results  Component Value Date   LDLCALC 105 (H) 10/22/2019   Lab Results  Component Value Date   TRIG 79 10/22/2019   Lab Results  Component Value Date   CHOLHDL 2.5 10/22/2019   Lab Results  Component Value Date   HGBA1C 4.8 08/08/2018      Assessment & Plan:   Problem List Items Addressed This Visit    None    Visit Diagnoses    Migraine without status migrainosus, not intractable, unspecified migraine type    -  Primary   Relevant Medications   ibuprofen (ADVIL) 800 MG tablet   SUMAtriptan (IMITREX) 50 MG tablet   Other Relevant Orders   Vitamin D, 25-hydroxy   Gastroesophageal reflux disease with esophagitis without hemorrhage         Assessment and Plan:  1. Migraine without status migrainosus, not intractable, unspecified migraine type Encouraged patient to increase hydration, use blue blockers and limit screen time, encouraged eye exam as soon  as financially able, encouraged follow-up with dentist.  Trial sumatriptan.  Patient to follow-up in mobile medicine unit in 2 weeks for further review.  Encourage patient to keep  headache diary  - Vitamin D, 25-hydroxy; Future - SUMAtriptan (IMITREX) 50 MG tablet; Take 1 tablet (50 mg total) by mouth every 2 (two) hours as needed for migraine. May repeat in 2 hours if headache persists or recurs.  Dispense: 10 tablet; Refill: 0   Follow Up Instructions:    I discussed the assessment and treatment plan with the patient. The patient was provided an opportunity to ask questions and all were answered. The patient agreed with the plan and demonstrated an understanding of the instructions.   The patient was advised to call back or seek an in-person evaluation if the symptoms worsen or if the condition fails to improve as anticipated.  I provided 30 minutes of non-face-to-face time during this encounter.   Ekam Besson S Mayers, PA-C    Meds ordered this encounter  Medications  . SUMAtriptan (IMITREX) 50 MG tablet    Sig: Take 1 tablet (50 mg total) by mouth every 2 (two) hours as needed for migraine. May repeat in 2 hours if headache persists or recurs.    Dispense:  10 tablet    Refill:  0    Order Specific Question:   Supervising Provider    Answer:   Storm Frisk [[id number]]    Follow-up: Return in about 2 weeks (around 07/07/2020).    Kasandra Knudsen Mayers, PA-C

## 2020-06-23 NOTE — Patient Instructions (Signed)
Please have lab completed at Primary Care at [facility].  We will call you with lab results when available.  Please follow-up in 2 weeks at the [facility] health mobile medicine unit.  You can find the schedule on [facility] website under mobile medicine.  I encourage you to increase your daily water intake, limit screen time, use blue blocker glasses for screen time, obtain an eye exam when financially feasible, follow-up with dentist.  Keep headache journal and bring with you to follow-up appointment.  This will be also very helpful if you do need to be seen by neurology.  I encourage you to try sumatriptan on onset of migraine.  I hope that you feel better soon  Roney Jaffe, PA-C Physician Assistant [facility] [email or web address]    Recurrent Migraine Headache  Migraines are a type of headache, and they are usually stronger and more sudden than normal headaches (tension headaches). Migraines are characterized by an intense pulsing, throbbing pain that is usually only present on one side of the head. Sometimes, migraine headaches can cause nausea, vomiting, sensitivity to light and sound, and vision changes. Recurrent migraines keep coming back (recurring). A migraine can last from 4 hours up to 3 days. What are the causes? The exact cause of this condition is not known. However, a migraine may be caused when nerves in the brain become irritated and release chemicals that cause inflammation of blood vessels. This inflammation causes pain. Certain things may also trigger migraines, such as:  A disruption in your regular eating and sleeping schedule.  Smoking.  Stress.  Menstruation.  Certain foods and drinks, such as: ? Aged cheese. ? Chocolate. ? Alcohol. ? Caffeine. ? Foods or drinks that contain nitrates, glutamate, aspartame, MSG, or tyramine.  Lack of sleep.  Hunger.  Physical exertion.  Fatigue.  High  altitude.  Weather changes.  Medicines, such as: ? Nitroglycerin, which is used to treat chest pain. ? Birth control pills. ? Estrogen. ? Some blood pressure medicines. What are the signs or symptoms? Symptoms of this condition vary for each person and may include:  Pain that is usually only present on one side of the head. In some cases, the pain may be on both sides of the head or around the head or neck.  Pulsating or throbbing pain.  Severe pain that prevents daily activities.  Pain that is aggravated by any physical activity.  Nausea, vomiting, or both.  Dizziness.  Pain with exposure to bright lights, loud noises, or activity.  General sensitivity to bright lights, loud noises, or smells. Before you get a migraine, you may get warning signs that a migraine is coming (aura). An aura may include:  Seeing flashing lights.  Seeing bright spots, halos, or zigzag lines.  Having tunnel vision or blurred vision.  Having numbness or a tingling feeling.  Having trouble talking.  Having muscle weakness.  Smelling a certain odor. How is this diagnosed? This condition is often diagnosed based on:  Your symptoms and medical history.  A physical exam. You may also have tests, including:  A CT scan or MRI of your brain. These imaging tests cannot diagnose migraines, but they can help to rule out other causes of headaches.  Blood tests. How is this treated? This condition is treated with:  Medicines. These are used for: ? Lessening pain and nausea. ? Preventing recurrent migraines.  Lifestyle changes, such as changes to your diet or sleeping patterns.  Behavior therapy, such as relaxation training  or biofeedback. Biofeedback is a treatment that involves teaching you to relax and use your brain to lower your heart rate and control your breathing. Follow these instructions at home: Medicines  Take over-the-counter and prescription medicines only as told by your  health care provider.  Do not drive or use heavy machinery while taking prescription pain medicine. Lifestyle  Do not use any products that contain nicotine or tobacco, such as cigarettes and e-cigarettes. If you need help quitting, ask your health care provider.  Limit alcohol intake to no more than 1 drink a day for nonpregnant women and 2 drinks a day for men. One drink equals 12 oz of beer, 5 oz of wine, or 1 oz of hard liquor.  Get 7-9 hours of sleep each night, or the amount of sleep recommended by your health care provider.  Limit your stress. Talk with your health care provider if you need help with stress management.  Maintain a healthy weight. If you need help losing weight, ask your health care provider.  Exercise regularly. Aim for 150 minutes of moderate-intensity exercise (walking, biking, yoga) or 75 minutes of vigorous exercise (running, circuit training, swimming) each week. General instructions   Keep a journal to find out what triggers your migraine headaches so you can avoid these triggers. For example, write down: ? What you eat and drink. ? How much sleep you get. ? Any change to your diet or medicines.  Lie down in a dark, quiet room when you have a migraine.  Try placing a cool towel over your head when you have a migraine.  Keep lights dim, if bright lights bother you and make your migraines worse.  Keep all follow-up visits as told by your health care provider. This is important. Contact a health care provider if:  Your pain does not improve, even with medicine.  Your migraines continue to return, even with medicine.  You have a fever.  You have weight loss. Get help right away if:  Your migraine becomes severe and medicine does not help.  You have a stiff neck.  You have a loss of vision.  You have muscle weakness or loss of muscle control.  You start losing your balance or have trouble walking.  You feel faint or you pass out.  You  develop new, severe symptoms.  You start having abrupt severe headaches that last for a second or less, like a thunderclap. Summary  Migraine headaches are usually stronger and more sudden than normal headaches (tension headaches). Migraines are characterized by an intense pulsing, throbbing pain that is usually only present on one side of the head.  The exact cause of this condition is not known. However, a migraine may be caused when nerves in the brain become irritated and release chemicals that cause inflammation of blood vessels.  Certain things may trigger migraines, such as changes to diet or sleeping patterns, smoking, certain foods, alcohol, stress, and certain medicines.  Sometimes, migraine headaches can cause nausea, vomiting, sensitivity to light and sound, and vision changes.  Migraines are often diagnosed based on your symptoms, medical history, and a physical exam. This information is not intended to replace advice given to you by your health care provider. Make sure you discuss any questions you have with your health care provider. Document Revised: 09/16/2017 Document Reviewed: 06/25/2016 Elsevier Patient Education  2020 [employer].

## 2020-06-25 ENCOUNTER — Other Ambulatory Visit (INDEPENDENT_AMBULATORY_CARE_PROVIDER_SITE_OTHER): Payer: 59

## 2020-06-25 ENCOUNTER — Other Ambulatory Visit: Payer: Self-pay

## 2020-06-25 DIAGNOSIS — G43909 Migraine, unspecified, not intractable, without status migrainosus: Secondary | ICD-10-CM | POA: Diagnosis not present

## 2020-06-26 ENCOUNTER — Telehealth: Payer: Self-pay | Admitting: *Deleted

## 2020-06-26 ENCOUNTER — Encounter: Payer: Self-pay | Admitting: Neurology

## 2020-06-26 LAB — VITAMIN D 25 HYDROXY (VIT D DEFICIENCY, FRACTURES): Vit D, 25-Hydroxy: 12.8 ng/mL — ABNORMAL LOW (ref 30.0–100.0)

## 2020-06-26 MED ORDER — VITAMIN D (ERGOCALCIFEROL) 1.25 MG (50000 UNIT) PO CAPS: 50000.0000 [IU] | ORAL_CAPSULE | ORAL | 2 refills | Status: DC

## 2020-06-26 NOTE — Addendum Note (Signed)
Addended by: Roney Jaffe on: 06/26/2020 08:56 AM   Modules accepted: Orders

## 2020-06-26 NOTE — Telephone Encounter (Signed)
-----   Message from Roney Jaffe, [place] sent at 06/26/2020  8:56 AM EDT ----- Please call patient and let her know that her vitamin D was very low, she needs to take 50,000 units once a week for the next 12 weeks.  This should offer her some relief from her headaches.

## 2020-06-26 NOTE — Telephone Encounter (Signed)
Patient verified DOB Patient is aware of vitamin D is low and needing to take the weekly supplement. Patient is aware of this helping with the headaches and aches and pains.

## 2020-06-30 ENCOUNTER — Other Ambulatory Visit: Payer: Self-pay | Admitting: Urology

## 2020-07-01 NOTE — Progress Notes (Deleted)
NEUROLOGY CONSULTATION NOTE  Theresa Mooney MRN: [id number] DOB: 1980/11/18  Referring provider: Burnice Logan, PA Primary care provider: Marcy Siren, DO  Reason for consult:  migraines  HISTORY OF PRESENT ILLNESS: Theresa Mooney is a 39 year old ***-handed female who presents for migraines.  History supplemented by referring provider's note.  She has history of occasional headaches since ***.  In July, she started having increased new headaches.  She describes *** bilateral *** pain with photophobia *** and sometimes feels dizzy when she bends over, but no nausea, vomiting, numbness or weakness.  *** Sometimes she wakes up with a headache.    They are aggravated by light and salt.     CT head on 05/28/2020 personally reviewed and was normal.  Current NSAIDS:  Ibuprofen 800mg  Current analgesics:  *** Current triptans:  Sumatriptan 50mg  Current ergotamine:  none Current anti-emetic:  none Current muscle relaxants:  none Current anti-anxiolytic:  *** Current sleep aide:  *** Current Antihypertensive medications:  *** Current Antidepressant medications:  *** Current Anticonvulsant medications:  *** Current anti-CGRP:  *** Current Vitamins/Herbal/Supplements:  D Current Antihistamines/Decongestants:  none Other therapy:  *** Hormone/birth control:  none  Past NSAIDS:  *** Past analgesics:  *** Past abortive triptans:  *** Past abortive ergotamine:  *** Past muscle relaxants:  *** Past anti-emetic:  *** Past antihypertensive medications:  *** Past antidepressant medications:  *** Past anticonvulsant medications:  *** Past anti-CGRP:  *** Past vitamins/Herbal/Supplements:  *** Past antihistamines/decongestants:  *** Other past therapies:  ***  Caffeine:  *** Alcohol:  *** Smoker:  *** Diet:  *** Exercise:  *** Depression:  ***; Anxiety:  *** Other pain:  *** Sleep hygiene:  *** Family history of headache:  ***  PAST MEDICAL HISTORY: Past Medical History:    Diagnosis Date  . 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 Ectopic pregnancy   . Elevated glucose   . Herpes 2002  . Infection   . Mixed hyperlipidemia   . Nicotine dependence   . Trichimoniasis 12-2010  . Urinary calculi     PAST SURGICAL HISTORY: Past Surgical History:  Procedure Laterality Date  . DILATION AND CURETTAGE OF UTERUS    . LAPAROSCOPY  11/27/2011   Procedure: LAPAROSCOPY OPERATIVE;  Surgeon: 01-2011, MD;  Location: [facility];  Service: Gynecology;  Laterality: Bilateral;  . SALPINGECTOMY     Bilateral for twin ectopic preg.   . TUBAL LIGATION  12-05-2010    MEDICATIONS: Current Outpatient Medications on File Prior to Visit  Medication Sig Dispense Refill  . albuterol (VENTOLIN HFA) 108 (90 Base) MCG/ACT inhaler Inhale 2 puffs into the lungs every 6 (six) hours as needed for wheezing or shortness of breath. 18 g 1  . Alum & Mag Hydroxide-Simeth (GI COCKTAIL) SUSP suspension Take 15-30 mLs by mouth every 2 (two) hours as needed.    Catalina Antigua ibuprofen (ADVIL) 800 MG tablet Take 800 mg by mouth every 8 (eight) hours as needed. for pain    . omeprazole (PRILOSEC) 20 MG capsule Take 1 capsule (20 mg total) by mouth daily. 14 capsule 0  . polyethylene glycol powder (GLYCOLAX/MIRALAX) 17 GM/SCOOP powder Take 17 g by mouth daily. 3350 g 1  . sucralfate (CARAFATE) 1 g tablet Take 1 tablet (1 g total) by mouth 4 (four) times daily -  with meals and at bedtime. 60 tablet 1  . sulfamethoxazole-trimethoprim (BACTRIM DS) 800-160 MG tablet Take 1 tablet by mouth 2 (two) times daily.    02-04-2011  SUMAtriptan (IMITREX) 50 MG tablet Take 1 tablet (50 mg total) by mouth every 2 (two) hours as needed for migraine. May repeat in 2 hours if headache persists or recurs. 10 tablet 0  . Vitamin D, Ergocalciferol, (DRISDOL) 1.25 MG (50000 UNIT) CAPS capsule Take 1 capsule (50,000 Units total) by mouth every 7 (seven) days. 4 capsule 2   No current facility-administered medications on file prior to  visit.    ALLERGIES: Allergies  Allergen Reactions  . Hydrocodone Nausea Only    Dizziness  . Penicillins     Did it involve swelling of the face/tongue/throat, SOB, or low BP? Y Did it involve sudden or severe rash/hives, skin peeling, or any reaction on the inside of your mouth or nose? N Did you need to seek medical attention at a hospital or doctor's office? Y When did it last happen?Over 1 month If all above answers are "NO", may proceed with cephalosporin use.      FAMILY HISTORY: Family History  Problem Relation Age of Onset  . Hypertension Mother   . Diabetes Maternal Grandmother   . Heart disease Maternal Grandmother   . Hypertension Maternal Grandmother   . Diabetes Maternal Grandfather   . Heart disease Maternal Grandfather   . Hypertension Maternal Grandfather   . Hyperlipidemia Daughter   . Breast cancer Maternal Aunt   . Anesthesia problems Neg Hx     SOCIAL HISTORY: Social History   Socioeconomic History  . Marital status: Single    Spouse name: Not on file  . Number of children: Not on file  . Years of education: Not on file  . Highest education level: Not on file  Occupational History  . Not on file  Tobacco Use  . Smoking status: Never Smoker  . Smokeless tobacco: Never Used  Vaping Use  . Vaping Use: Never used  Substance and Sexual Activity  . Alcohol use: Not Currently    Comment: social  . Drug use: No  . Sexual activity: Yes    Birth control/protection: Surgical  Other Topics Concern  . Not on file  Social History Narrative  . Not on file   Social Determinants of Health   Financial Resource Strain:   . Difficulty of Paying Living Expenses: Not on file  Food Insecurity:   . Worried About Programme researcher, broadcasting/film/video in the Last Year: Not on file  . Ran Out of Food in the Last Year: Not on file  Transportation Needs:   . Lack of Transportation (Medical): Not on file  . Lack of Transportation (Non-Medical): Not on file  Physical  Activity:   . Days of Exercise per Week: Not on file  . Minutes of Exercise per Session: Not on file  Stress:   . Feeling of Stress : Not on file  Social Connections:   . Frequency of Communication with Friends and Family: Not on file  . Frequency of Social Gatherings with Friends and Family: Not on file  . Attends Religious Services: Not on file  . Active Member of Clubs or Organizations: Not on file  . Attends Banker Meetings: Not on file  . Marital Status: Not on file  Intimate Partner Violence:   . Fear of Current or Ex-Partner: Not on file  . Emotionally Abused: Not on file  . Physically Abused: Not on file  . Sexually Abused: Not on file    PHYSICAL EXAM: *** General: No acute distress.  Patient appears well-groomed.   Head:  Normocephalic/atraumatic Eyes:  fundi examined but not visualized Neck: supple, no paraspinal tenderness, full range of motion Back: No paraspinal tenderness Heart: regular rate and rhythm Lungs: Clear to auscultation bilaterally. Vascular: No carotid bruits. Neurological Exam: Mental status: alert and oriented to person, place, and time, recent and remote memory intact, fund of knowledge intact, attention and concentration intact, speech fluent and not dysarthric, language intact. Cranial nerves: CN I: not tested CN II: pupils equal, round and reactive to light, visual fields intact CN III, IV, VI:  full range of motion, no nystagmus, no ptosis CN V: facial sensation intact CN VII: upper and lower face symmetric CN VIII: hearing intact CN IX, X: gag intact, uvula midline CN XI: sternocleidomastoid and trapezius muscles intact CN XII: tongue midline Bulk & Tone: normal, no fasciculations. Motor:  5/5 throughout  Sensation:  Pinprick *** temperature *** and vibration sensation intact.  ***. Deep Tendon Reflexes:  2+ throughout, *** toes downgoing.  *** Finger to nose testing:  Without dysmetria.  *** Heel to shin:  Without  dysmetria.  *** Gait:  Normal station and stride.  Able to turn and tandem walk. Romberg ***.  IMPRESSION: Migraine without aura  PLAN: 1.  For preventative management, *** 2.  For abortive therapy, *** 3.  Limit use of pain relievers to no more than 2 days out of week to prevent risk of rebound or medication-overuse headache. 4.  Keep headache diary 5.  Exercise, hydration, caffeine cessation, sleep hygiene, monitor for and avoid triggers 6.  Follow up ***   Thank you for allowing me to take part in the care of this patient.  Shon Millet, DO  CC:  Marcy Siren, DO  Burnice Logan, [place]

## 2020-07-02 ENCOUNTER — Encounter: Payer: Self-pay | Admitting: Obstetrics and Gynecology

## 2020-07-02 ENCOUNTER — Ambulatory Visit (INDEPENDENT_AMBULATORY_CARE_PROVIDER_SITE_OTHER): Payer: 59 | Admitting: Obstetrics and Gynecology

## 2020-07-02 ENCOUNTER — Other Ambulatory Visit: Payer: Self-pay

## 2020-07-02 ENCOUNTER — Other Ambulatory Visit (HOSPITAL_COMMUNITY)
Admission: RE | Admit: 2020-07-02 | Discharge: 2020-07-02 | Disposition: A | Discharge status: Home / Self Care | Payer: 59 | Source: Ambulatory Visit | Attending: Obstetrics and Gynecology | Admitting: Obstetrics and Gynecology

## 2020-07-02 VITALS — BP 107/73 | HR 75 | Ht 59.0 in | Wt 204.0 lb

## 2020-07-02 DIAGNOSIS — N761 Subacute and chronic vaginitis: Secondary | ICD-10-CM | POA: Insufficient documentation

## 2020-07-02 DIAGNOSIS — R300 Dysuria: Secondary | ICD-10-CM | POA: Diagnosis not present

## 2020-07-02 LAB — POCT URINALYSIS DIPSTICK
Bilirubin, UA: NEGATIVE
Blood, UA: NEGATIVE
Clarity, UA: NEGATIVE
Glucose, UA: NEGATIVE
Ketones, UA: NEGATIVE
Leukocytes, UA: NEGATIVE
Nitrite, UA: NEGATIVE
Odor: NEGATIVE
Protein, UA: NEGATIVE
Spec Grav, UA: 1.015 (ref 1.010–1.025)
Urobilinogen, UA: 0.2 E.U./dL
pH, UA: 7 (ref 5.0–8.0)

## 2020-07-02 MED ORDER — BORIC ACID CRYS: 600.0000 mg | CRYSTALS | Freq: Every day | 5 refills | Status: DC

## 2020-07-02 NOTE — Progress Notes (Signed)
39 yo P5 here for the evaluation for possible UTI and concerns for recurrent BV. Patient reports urinary frequency and dysuria for the past 2 days. She reports frequent BV infections which she treats with Flagyl several times a year. She was given several refills. Patient is sexually active using condoms. She denies any other complaints.   Past Medical History:  Diagnosis Date  . 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 Ectopic pregnancy   . Elevated glucose   . Herpes 2002  . Infection   . Mixed hyperlipidemia   . Nicotine dependence   . Trichimoniasis 12-2010  . Urinary calculi    Past Surgical History:  Procedure Laterality Date  . DILATION AND CURETTAGE OF UTERUS    . LAPAROSCOPY  11/27/2011   Procedure: LAPAROSCOPY OPERATIVE;  Surgeon: Catalina Antigua, MD;  Location: [facility];  Service: Gynecology;  Laterality: Bilateral;  . SALPINGECTOMY     Bilateral for twin ectopic preg.   . TUBAL LIGATION  12-05-2010   Family History  Problem Relation Age of Onset  . Hypertension Mother   . Diabetes Maternal Grandmother   . Heart disease Maternal Grandmother   . Hypertension Maternal Grandmother   . Diabetes Maternal Grandfather   . Heart disease Maternal Grandfather   . Hypertension Maternal Grandfather   . Hyperlipidemia Daughter   . Breast cancer Maternal Aunt   . Anesthesia problems Neg Hx    Social History   Tobacco Use  . Smoking status: Never Smoker  . Smokeless tobacco: Never Used  Vaping Use  . Vaping Use: Never used  Substance Use Topics  . Alcohol use: Yes    Alcohol/week: 1.0 standard drink    Types: 1 Glasses of wine per week    Comment: social  . Drug use: No   ROS See pertinent in HPI. All other systems reviewed and negative  Blood pressure 107/73, pulse 75, height 4\' 11"  (1.499 m), weight 204 lb (92.5 kg). GENERAL: Well-developed, well-nourished female in no acute distress.  ABDOMEN: Soft, nontender, nondistended. No organomegaly. PELVIC: Normal external  female genitalia. Vagina is pink and rugated.  Normal discharge. Normal appearing cervix. Uterus is normal in size. No adnexal mass or tenderness. EXTREMITIES: No cyanosis, clubbing, or edema, 2+ distal pulses.  A/P 39 yo with recurrent BV infection and possible UTI - vaginal swab collected - urine culture sent - Patient will be contacted with abnormal results - Rx boric acid provided - Reviewed vulva care

## 2020-07-02 NOTE — Progress Notes (Signed)
NGYN pt c/o recurrent BV infections Normal pap 10/22/19 Pt c/o urinary frequency and dysuria x 2 days.

## 2020-07-03 ENCOUNTER — Ambulatory Visit: Payer: 59 | Admitting: Neurology

## 2020-07-03 LAB — CERVICOVAGINAL ANCILLARY ONLY
Bacterial Vaginitis (gardnerella): NEGATIVE
Candida Glabrata: NEGATIVE
Candida Vaginitis: NEGATIVE
Comment: NEGATIVE
Comment: NEGATIVE
Comment: NEGATIVE

## 2020-07-04 LAB — URINE CULTURE

## 2020-07-08 ENCOUNTER — Encounter: Payer: Self-pay | Admitting: *Deleted

## 2020-07-08 ENCOUNTER — Ambulatory Visit (HOSPITAL_COMMUNITY): Admission: EM | Admit: 2020-07-08 | Discharge: 2020-07-08 | Discharge status: Against Medical Advice | Payer: 59

## 2020-07-08 ENCOUNTER — Other Ambulatory Visit: Payer: Self-pay

## 2020-07-10 ENCOUNTER — Ambulatory Visit: Payer: Medicaid Other | Admitting: Gastroenterology

## 2020-07-10 ENCOUNTER — Telehealth: Payer: Self-pay | Admitting: Internal Medicine

## 2020-07-10 NOTE — Telephone Encounter (Signed)
1) Medication(s) Requested (by nam Diclofenac Sodium CR 100 MG 24 hr tablet    2) Pharmacy of Choice:#[id number], [place] - [address] AT [facility] [address]  [address] [place] [id number]   3) Special Requests: Pt want for inflammation to go down   Approved medications will be sent to the pharmacy, we will reach out if there is an issue.  Requests made after 3pm may not be addressed until the following business day!  If a patient is unsure of the name of the medication(s) please note and ask patient to call back when they are able to provide all info, do not send to responsible party until all information is available!

## 2020-07-10 NOTE — Telephone Encounter (Signed)
What inflammation?  This prescription was written in August by the ED

## 2020-07-10 NOTE — Telephone Encounter (Signed)
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

## 2020-07-29 ENCOUNTER — Other Ambulatory Visit: Payer: Self-pay

## 2020-07-29 ENCOUNTER — Encounter (HOSPITAL_COMMUNITY): Payer: Self-pay

## 2020-07-29 ENCOUNTER — Ambulatory Visit: Payer: 59

## 2020-07-29 ENCOUNTER — Emergency Department (HOSPITAL_COMMUNITY)
Admission: EM | Admit: 2020-07-29 | Discharge: 2020-07-29 | Disposition: A | Discharge status: Home / Self Care | Payer: 59 | Attending: Emergency Medicine | Admitting: Emergency Medicine

## 2020-07-29 ENCOUNTER — Emergency Department (HOSPITAL_COMMUNITY): Payer: 59

## 2020-07-29 DIAGNOSIS — J45909 Unspecified asthma, uncomplicated: Secondary | ICD-10-CM | POA: Insufficient documentation

## 2020-07-29 DIAGNOSIS — M5412 Radiculopathy, cervical region: Secondary | ICD-10-CM | POA: Diagnosis not present

## 2020-07-29 DIAGNOSIS — M549 Dorsalgia, unspecified: Secondary | ICD-10-CM | POA: Diagnosis not present

## 2020-07-29 DIAGNOSIS — R202 Paresthesia of skin: Secondary | ICD-10-CM | POA: Insufficient documentation

## 2020-07-29 DIAGNOSIS — R079 Chest pain, unspecified: Secondary | ICD-10-CM | POA: Diagnosis not present

## 2020-07-29 DIAGNOSIS — M542 Cervicalgia: Secondary | ICD-10-CM | POA: Diagnosis present

## 2020-07-29 MED ORDER — PREDNISONE 10 MG (21) PO TBPK: ORAL_TABLET | ORAL | 0 refills | Status: AC

## 2020-07-29 NOTE — Discharge Instructions (Addendum)
Take the medications as prescribed.  You can continue the muscle relaxants and anti-inflammatory pain medications.  Follow-up with your primary care doctor to discuss further treatment if the symptoms persist.  To follow-up with the chiropractor as recommended.  Return for issues with weakness in your grip or other concerning neurologic symptoms

## 2020-07-29 NOTE — ED Provider Notes (Signed)
[facility]-EMERGENCY DEPT Provider Note   CSN: [id number] Arrival date & time: 07/29/20  1054     History Chief Complaint  Patient presents with  . Neck Pain  . Back Pain  . Numbness    Theresa Mooney is a 39 y.o. female.  HPI   Patient presents to the ED for evaluation of back chest and arm pain.  Patient states she started having symptoms a couple weeks ago.  Patient has been having pain in the back of her neck.  It also goes towards her arm.  She also feels it coming across her torso.  Patient states movements and position increases the pain.  She feels like when she puts her head down and tries to move her arm it is more painful.  Patient went to an urgent care and states she had x-rays who told that she might have a little bit of a curvature in her spine.  They recommended evaluation by a chiropractor.  Patient has been taking NSAIDs and muscle relaxants with some improvement but the pain persists.  Past Medical History:  Diagnosis Date  . Asthma   . 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 Ectopic pregnancy   . Elevated glucose   . Herpes 2002  . Infection   . Migraine   . Mixed hyperlipidemia   . Nicotine dependence   . Trichimoniasis 12-2010  . Urinary calculi     Patient Active Problem List   Diagnosis Date Noted  . Migraine without status migrainosus, not intractable 06/23/2020  . h/o postpartum BTL  12/13/2011  . S/P ectopic pregnancy 12/13/2011    Past Surgical History:  Procedure Laterality Date  . DILATION AND CURETTAGE OF UTERUS    . LAPAROSCOPY  11/27/2011   Procedure: LAPAROSCOPY OPERATIVE;  Surgeon: Catalina Antigua, MD;  Location: [facility];  Service: Gynecology;  Laterality: Bilateral;  . SALPINGECTOMY     Bilateral for twin ectopic preg.   . TUBAL LIGATION  12-05-2010     OB History    Gravida  7   Para  5   Term      Preterm      AB  2   Living  5     SAB      TAB  1   Ectopic  1   Multiple      Live  Births  5           Family History  Problem Relation Age of Onset  . Hypertension Mother   . Diabetes Maternal Grandmother   . Heart disease Maternal Grandmother   . Hypertension Maternal Grandmother   . Diabetes Maternal Grandfather   . Heart disease Maternal Grandfather   . Hypertension Maternal Grandfather   . Hyperlipidemia Daughter   . Breast cancer Maternal Aunt   . Anesthesia problems Neg Hx     Social History   Tobacco Use  . Smoking status: Never Smoker  . Smokeless tobacco: Never Used  Vaping Use  . Vaping Use: Never used  Substance Use Topics  . Alcohol use: Not Currently    Alcohol/week: 1.0 standard drink    Types: 1 Glasses of wine per week    Comment: social  . Drug use: No    Home Medications Prior to Admission medications   Medication Sig Start Date End Date Taking? Authorizing Provider  albuterol (VENTOLIN HFA) 108 (90 Base) MCG/ACT inhaler Inhale 2 puffs into the lungs every 6 (six) hours as  needed for wheezing or shortness of breath. 05/05/20   Arvilla Market, DO  Alum & Mag Hydroxide-Simeth (GI COCKTAIL) SUSP suspension Take 15-30 mLs by mouth every 2 (two) hours as needed. 05/22/20   [provider]  Boric Acid CRYS Place 600 mg vaginally at bedtime. Use vaginally every night for two weeks then twice a week 07/02/20   Constant, Peggy, MD  omeprazole (PRILOSEC) 20 MG capsule Take 1 capsule (20 mg total) by mouth daily. 05/19/20   Robinson, [place] N, PA-C  predniSONE (STERAPRED UNI-PAK 21 TAB) 10 MG (21) TBPK tablet Take 5 tablets (50 mg total) by mouth daily for 2 days, THEN 4 tablets (40 mg total) daily for 2 days, THEN 3 tablets (30 mg total) daily for 2 days, THEN 2 tablets (20 mg total) daily for 2 days, THEN 1 tablet (10 mg total) daily for 2 days. 07/29/20 08/08/20  Linwood Dibbles, MD  sucralfate (CARAFATE) 1 g tablet Take 1 tablet (1 g total) by mouth 4 (four) times daily -  with meals and at bedtime. 05/07/20   Arvilla Market, DO  Vitamin D, Ergocalciferol, (DRISDOL) 1.25 MG (50000 UNIT) CAPS capsule Take 1 capsule (50,000 Units total) by mouth every 7 (seven) days. 06/26/20   Mayers, Cari S, PA-C  SUMAtriptan (IMITREX) 50 MG tablet Take 1 tablet (50 mg total) by mouth every 2 (two) hours as needed for migraine. May repeat in 2 hours if headache persists or recurs. Patient not taking: Reported on 07/02/2020 06/23/20 07/29/20  Mayers, Kasandra Knudsen, PA-C    Allergies    Hydrocodone and Penicillins  Review of Systems   Review of Systems  All other systems reviewed and are negative.   Physical Exam Updated Vital Signs BP 112/60   Pulse 72   Temp 98.7 F (37.1 C) (Oral)   Resp 16   Ht 1.499 m (4\' 11" )   Wt 92.1 kg   SpO2 100%   BMI 41.00 kg/m   Physical Exam Vitals and nursing note reviewed.  Constitutional:      General: She is not in acute distress.    Appearance: She is well-developed.  HENT:     Head: Normocephalic and atraumatic.     Right Ear: External ear normal.     Left Ear: External ear normal.  Eyes:     General: No scleral icterus.       Right eye: No discharge.        Left eye: No discharge.     Conjunctiva/sclera: Conjunctivae normal.  Neck:     Trachea: No tracheal deviation.  Cardiovascular:     Rate and Rhythm: Normal rate and regular rhythm.  Pulmonary:     Effort: Pulmonary effort is normal. No respiratory distress.     Breath sounds: Normal breath sounds. No stridor. No wheezing or rales.  Abdominal:     General: Bowel sounds are normal. There is no distension.     Palpations: Abdomen is soft.     Tenderness: There is no abdominal tenderness. There is no guarding or rebound.  Musculoskeletal:        General: No tenderness.     Cervical back: Neck supple.     Comments: Tenderness palpation paraspinal muscles in the neck, mild tenderness to paraspinal muscles thoracic region  Skin:    General: Skin is warm and dry.     Findings: No rash.  Neurological:     Mental  Status: She is alert.  Cranial Nerves: No cranial nerve deficit (no facial droop, extraocular movements intact, no slurred speech).     Sensory: No sensory deficit.     Motor: No abnormal muscle tone or seizure activity.     Coordination: Coordination normal.     Comments: 5-5 grip strength bilaterally, 5 out of 5 biceps strength bilaterally, normal sensation throughout     ED Results / Procedures / Treatments   Labs (all labs ordered are listed, but only abnormal results are displayed) Labs Reviewed - No data to display  EKG None  Radiology DG Chest 2 View  Result Date: 07/29/2020 CLINICAL DATA:  Pain EXAM: CHEST - 2 VIEW COMPARISON:  05/22/2020 chest radiograph and prior. FINDINGS: The heart size and mediastinal contours are within normal limits. Both lungs are clear. The visualized skeletal structures are unremarkable. IMPRESSION: No focal airspace disease. Electronically Signed   By: Stana Bunting M.D.   On: 07/29/2020 12:05    Procedures Procedures (including critical care time)  Medications Ordered in ED Medications - No data to display  ED Course  I have reviewed the triage vital signs and the nursing notes.  Pertinent labs & imaging results that were available during my care of the patient were reviewed by me and considered in my medical decision making (see chart for details).  Clinical Course as of Jul 29 1252  Tue Jul 29, 2020  1253 Chest x-ray without acute abnormality.   [JK]    Clinical Course User Index [JK] Linwood Dibbles, MD   MDM Rules/Calculators/A&P                         Discussed with patient symptoms are concerning for the possibility of a radiculopathy.  Patient was referred to a chiropractor.  She has not seen them yet because of insurance issues.  Discussed MRI testing with patient.  I do think it would be reasonable considering her symptoms but not absolutely indicated at this time as she has not tried any conservative therapy and she has  normal strength and sensation.  I think because of the cost concerns it is reasonable to try more conservative management initially  Patient describes some pain going towards her chest.  I doubt this is related to cardiopulmonary issue.  Most likely musculoskeletal.  Final Clinical Impression(s) / ED Diagnoses Final diagnoses:  Cervical radiculopathy    Rx / DC Orders ED Discharge Orders         Ordered    predniSONE (STERAPRED UNI-PAK 21 TAB) 10 MG (21) TBPK tablet        07/29/20 1251           Linwood Dibbles, MD 07/29/20 1253

## 2020-07-29 NOTE — ED Triage Notes (Signed)
Patient went a few weeks ago to [facility] medical center with neck, shoulder, arm pain. She got Xrays and said her neck has a little curve to it. She thinks she pulled something last weekend because she cannot sit up straight. Around the left side of her breast and upper arms hurts and has pressure. She cant scroll on her phone with her wrist and feels like both her hands, fingers, legs got worse. Patient has numbness and tingling in both arms and hands.

## 2020-08-02 ENCOUNTER — Encounter (HOSPITAL_BASED_OUTPATIENT_CLINIC_OR_DEPARTMENT_OTHER): Payer: Self-pay | Admitting: Emergency Medicine

## 2020-08-02 ENCOUNTER — Emergency Department (HOSPITAL_BASED_OUTPATIENT_CLINIC_OR_DEPARTMENT_OTHER)
Admission: EM | Admit: 2020-08-02 | Discharge: 2020-08-02 | Disposition: A | Discharge status: Home / Self Care | Payer: 59 | Attending: Emergency Medicine | Admitting: Emergency Medicine

## 2020-08-02 ENCOUNTER — Other Ambulatory Visit: Payer: Self-pay

## 2020-08-02 DIAGNOSIS — R202 Paresthesia of skin: Secondary | ICD-10-CM | POA: Insufficient documentation

## 2020-08-02 DIAGNOSIS — J45909 Unspecified asthma, uncomplicated: Secondary | ICD-10-CM | POA: Insufficient documentation

## 2020-08-02 DIAGNOSIS — M5412 Radiculopathy, cervical region: Secondary | ICD-10-CM

## 2020-08-02 LAB — URINALYSIS, ROUTINE W REFLEX MICROSCOPIC
Bilirubin Urine: NEGATIVE
Glucose, UA: NEGATIVE mg/dL
Ketones, ur: NEGATIVE mg/dL
Leukocytes,Ua: NEGATIVE
Nitrite: NEGATIVE
Protein, ur: NEGATIVE mg/dL
Specific Gravity, Urine: 1.02 (ref 1.005–1.030)
pH: 7 (ref 5.0–8.0)

## 2020-08-02 LAB — URINALYSIS, MICROSCOPIC (REFLEX)

## 2020-08-02 NOTE — ED Provider Notes (Signed)
[facility] EMERGENCY DEPARTMENT Provider Note   CSN: [id number] Arrival date & time: 08/02/20  1454     History Chief Complaint  Patient presents with  . Shoulder Pain    Theresa Mooney is a 39 y.o. female.  Patient is a 39 year old female who presents with neck pain.  She says it started about 3 weeks ago but is been worse over the last week.  Its in the back of her neck and radiates down her right side.  There are some pain that goes down into her back and upper chest and into her right arm.  She has some intermittent tingling in her right mid arm.  No weakness in the arm.  No fevers.  No recent injuries.  She was seen by her PCP and had x-rays of her cervical spine.  She says that there is some straightening of the bones but otherwise unremarkable.  She was also seen in the emergency department and given prescription for a prednisone pack.  She did not get that filled.  She had some prednisone at home and she took 2 tablets but has not taken any more.  She does have a muscle relaxer at home which she does say helps for a while but then it comes back.        Past Medical History:  Diagnosis Date  . Asthma   . 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 Ectopic pregnancy   . Elevated glucose   . Herpes 2002  . Infection   . Migraine   . Mixed hyperlipidemia   . Nicotine dependence   . Trichimoniasis 12-2010  . Urinary calculi     Patient Active Problem List   Diagnosis Date Noted  . Migraine without status migrainosus, not intractable 06/23/2020  . h/o postpartum BTL  12/13/2011  . S/P ectopic pregnancy 12/13/2011    Past Surgical History:  Procedure Laterality Date  . DILATION AND CURETTAGE OF UTERUS    . LAPAROSCOPY  11/27/2011   Procedure: LAPAROSCOPY OPERATIVE;  Surgeon: Catalina Antigua, MD;  Location: [facility];  Service: Gynecology;  Laterality: Bilateral;  . SALPINGECTOMY     Bilateral for twin ectopic preg.   . TUBAL LIGATION  12-05-2010     OB  History    Gravida  7   Para  5   Term      Preterm      AB  2   Living  5     SAB      TAB  1   Ectopic  1   Multiple      Live Births  5           Family History  Problem Relation Age of Onset  . Hypertension Mother   . Diabetes Maternal Grandmother   . Heart disease Maternal Grandmother   . Hypertension Maternal Grandmother   . Diabetes Maternal Grandfather   . Heart disease Maternal Grandfather   . Hypertension Maternal Grandfather   . Hyperlipidemia Daughter   . Breast cancer Maternal Aunt   . Anesthesia problems Neg Hx     Social History   Tobacco Use  . Smoking status: Never Smoker  . Smokeless tobacco: Never Used  Vaping Use  . Vaping Use: Never used  Substance Use Topics  . Alcohol use: Not Currently    Alcohol/week: 1.0 standard drink    Types: 1 Glasses of wine per week    Comment: social  . Drug use: No  Home Medications Prior to Admission medications   Medication Sig Start Date End Date Taking? Authorizing Provider  albuterol (VENTOLIN HFA) 108 (90 Base) MCG/ACT inhaler Inhale 2 puffs into the lungs every 6 (six) hours as needed for wheezing or shortness of breath. 05/05/20   Arvilla Market, DO  Alum & Mag Hydroxide-Simeth (GI COCKTAIL) SUSP suspension Take 15-30 mLs by mouth every 2 (two) hours as needed. 05/22/20   [provider]  Boric Acid CRYS Place 600 mg vaginally at bedtime. Use vaginally every night for two weeks then twice a week 07/02/20   Constant, Peggy, MD  omeprazole (PRILOSEC) 20 MG capsule Take 1 capsule (20 mg total) by mouth daily. 05/19/20   Robinson, [place] N, PA-C  predniSONE (STERAPRED UNI-PAK 21 TAB) 10 MG (21) TBPK tablet Take 5 tablets (50 mg total) by mouth daily for 2 days, THEN 4 tablets (40 mg total) daily for 2 days, THEN 3 tablets (30 mg total) daily for 2 days, THEN 2 tablets (20 mg total) daily for 2 days, THEN 1 tablet (10 mg total) daily for 2 days. 07/29/20 08/08/20  Linwood Dibbles, MD   sucralfate (CARAFATE) 1 g tablet Take 1 tablet (1 g total) by mouth 4 (four) times daily -  with meals and at bedtime. 05/07/20   Arvilla Market, DO  Vitamin D, Ergocalciferol, (DRISDOL) 1.25 MG (50000 UNIT) CAPS capsule Take 1 capsule (50,000 Units total) by mouth every 7 (seven) days. 06/26/20   Mayers, Cari S, PA-C  SUMAtriptan (IMITREX) 50 MG tablet Take 1 tablet (50 mg total) by mouth every 2 (two) hours as needed for migraine. May repeat in 2 hours if headache persists or recurs. Patient not taking: Reported on 07/02/2020 06/23/20 07/29/20  Mayers, Cari S, PA-C    Allergies    Hydrocodone and Penicillins  Review of Systems   Review of Systems  Constitutional: Negative for fever.  Gastrointestinal: Negative for nausea and vomiting.  Musculoskeletal: Positive for myalgias and neck pain. Negative for arthralgias, back pain and joint swelling.  Skin: Negative for wound.  Neurological: Negative for weakness, numbness and headaches.    Physical Exam Updated Vital Signs BP (!) 120/43 (BP Location: Left Arm)   Pulse 75   Temp 98.6 F (37 C) (Oral)   Resp 18   Ht 4\' 11"  (1.499 m)   Wt 92.1 kg   SpO2 100%   BMI 41.01 kg/m   Physical Exam Constitutional:      Appearance: She is well-developed.  HENT:     Head: Normocephalic and atraumatic.  Neck:     Comments: Positive tenderness in her mid cervical spine.  There is also some pain over the right trapezius muscle and in the musculature of the right upper back and upper chest.  There is no bony tenderness to the shoulder.  No pain on range of motion of the shoulder joint on the right.  Radial pulses are intact.  She has normal sensation and motor function distally.  No swelling noted.  No warmth or erythema. Cardiovascular:     Rate and Rhythm: Normal rate.  Pulmonary:     Effort: Pulmonary effort is normal.  Musculoskeletal:        General: Tenderness present.     Cervical back: Normal range of motion and neck supple.   Skin:    General: Skin is warm and dry.  Neurological:     Mental Status: She is alert and oriented to person, place, and time.  ED Results / Procedures / Treatments   Labs (all labs ordered are listed, but only abnormal results are displayed) Labs Reviewed  URINALYSIS, ROUTINE W REFLEX MICROSCOPIC - Abnormal; Notable for the following components:      Result Value   Hgb urine dipstick SMALL (*)    All other components within normal limits  URINALYSIS, MICROSCOPIC (REFLEX) - Abnormal; Notable for the following components:   Bacteria, UA MANY (*)    All other components within normal limits    EKG None  Radiology No results found.  Procedures Procedures (including critical care time)  Medications Ordered in ED Medications - No data to display  ED Course  I have reviewed the triage vital signs and the nursing notes.  Pertinent labs & imaging results that were available during my care of the patient were reviewed by me and considered in my medical decision making (see chart for details).    MDM Rules/Calculators/A&P                          Patient presents with neck and upper back pain.  Seems to be cervical radiculopathy.  There is no bony tenderness to her shoulder joint noted.  No symptoms that would be more concerning for ACS.  No fevers or recent trauma to warrant further imaging.  No current neurologic deficits.  She will go ahead and pick up the prescription for the prednisone taper that was previously prescribed to her.  She will use her muscle relaxer as needed.  She has a follow-up appointment with her PCP next week.  Return precautions were given. Final Clinical Impression(s) / ED Diagnoses Final diagnoses:  Cervical radiculopathy    Rx / DC Orders ED Discharge Orders    None       Rolan Bucco, MD 08/02/20 1558

## 2020-08-02 NOTE — ED Triage Notes (Signed)
Seen at [facility] and [facility] previously for the same.  Reports pain in neck that radiates into the right shoulder and arm as well as right breast.  Also c/o numbness and tingling in both arms feels like a "nerve situation" per patient.  Reports its difficult to type.  Reports she was put on prednisone but only took two.  Used what she had at home.  Also reports taking a muscle relaxer with little relief.

## 2020-08-02 NOTE — ED Notes (Signed)
NOte: a colleague d/c'd. This pt. For me.

## 2020-08-02 NOTE — Discharge Instructions (Addendum)
Take the prednisone pack as previously prescribed.  Use your muscle relaxant as needed.  Follow-up with your primary care doctor next week as scheduled.  Return here as needed if you have any worsening symptoms.

## 2020-08-06 ENCOUNTER — Other Ambulatory Visit: Payer: Self-pay

## 2020-08-06 ENCOUNTER — Encounter: Payer: Self-pay | Admitting: Internal Medicine

## 2020-08-06 ENCOUNTER — Ambulatory Visit (INDEPENDENT_AMBULATORY_CARE_PROVIDER_SITE_OTHER): Payer: 59 | Admitting: Internal Medicine

## 2020-08-06 VITALS — BP 115/71 | HR 66 | Temp 97.3°F | Resp 17 | Wt 195.0 lb

## 2020-08-06 DIAGNOSIS — G8929 Other chronic pain: Secondary | ICD-10-CM

## 2020-08-06 DIAGNOSIS — Z0289 Encounter for other administrative examinations: Secondary | ICD-10-CM | POA: Diagnosis not present

## 2020-08-06 DIAGNOSIS — M2550 Pain in unspecified joint: Secondary | ICD-10-CM

## 2020-08-06 MED ORDER — DICLOFENAC SODIUM 75 MG PO TBEC: 75.0000 mg | DELAYED_RELEASE_TABLET | Freq: Two times a day (BID) | ORAL | 0 refills | Status: DC

## 2020-08-06 NOTE — Progress Notes (Signed)
Subjective:    Theresa Mooney - 39 y.o. female MRN [id number]  Date of birth: October 24, 1980  HPI  Theresa Mooney is here for concerns about persistent pain. It started with neck pain. She went to [facility]. Had xray where she was told that she had loss of cervical curvature and that she should go to a chiropractor. After that, she started having pain in the right shoulder and arm radiating underneath her breast. She is now feeling diffuse aching across her back and around her legs.   She was thought to have fibromyalgia by prior PCP. Was started on Cymbalta 20 mg BID in Jan 2020. She stopped it over a year ago because her pain resolved.      Health Maintenance:  Health Maintenance Due  Topic Date Due  . COVID-19 Vaccine (1) Never done    -  reports that she has never smoked. She has never used smokeless tobacco. - Review of Systems: Per HPI. - Past Medical History: Patient Active Problem List   Diagnosis Date Noted  . Migraine without status migrainosus, not intractable 06/23/2020  . h/o postpartum BTL  12/13/2011  . S/P ectopic pregnancy 12/13/2011   - Medications: reviewed and updated   Objective:   Physical Exam BP 115/71   Pulse 66   Temp (!) 97.3 F (36.3 C) (Temporal)   Resp 17   Wt 195 lb (88.5 kg)   SpO2 98%   BMI 39.39 kg/m  Physical Exam Constitutional:      General: She is not in acute distress.    Appearance: She is not diaphoretic.  Cardiovascular:     Rate and Rhythm: Normal rate.  Pulmonary:     Effort: Pulmonary effort is normal. No respiratory distress.  Chest:     Comments: Breast exam: Breasts symmetric. No erythema/rashes/skin color changes/skin texture changes/nipple discharge. No discrete palpable masses appreciated in axilla or 4 quadrants of the breast bilaterally.  Musculoskeletal:        General: No swelling or deformity. Normal range of motion.     Right lower leg: No edema.     Left lower leg: No edema.  Skin:    General:  Skin is warm and dry.  Neurological:     General: No focal deficit present.     Mental Status: She is alert and oriented to person, place, and time.     Motor: No weakness.     Gait: Gait normal.  Psychiatric:        Mood and Affect: Affect normal.        Judgment: Judgment normal.            Assessment & Plan:   1. Chronic pain of multiple joints Unknown etiology. Suspect could be fibromyalgia. Will prescribe NSAID. Check labs to guide next steps. If unremarkable, would restart Cymbalta. Reviewed that she had normal mammogram in Jan 2020. Reviewed that ESR has been elevated in the past. Patient is not on statin or other medication that could cause diffuse pain.  - Rheumatoid factor - Sedimentation Rate - C-reactive protein - CK - diclofenac (VOLTAREN) 75 MG EC tablet; Take 1 tablet (75 mg total) by mouth 2 (two) times daily.  Dispense: 60 tablet; Refill: 0 - ANA,IFA RA Diag Pnl w/rflx Tit/Patn  2. Encounter for completion of form with patient Paperwork completed for [facility] to explain need for periodic follow up and restrictions during flare up of chronic pain.     Phill Myron, D.O. 08/06/2020,  3:08 PM Primary Care at [facility]

## 2020-08-08 ENCOUNTER — Telehealth: Payer: Self-pay | Admitting: Internal Medicine

## 2020-08-08 LAB — CK: Total CK: 31 U/L — ABNORMAL LOW (ref 32–182)

## 2020-08-08 LAB — ANA,IFA RA DIAG PNL W/RFLX TIT/PATN
ANA Titer 1: NEGATIVE
Cyclic Citrullin Peptide Ab: 6 units (ref 0–19)
Rheumatoid fact SerPl-aCnc: <10 IU/mL (ref 0.0–13.9)

## 2020-08-08 LAB — SEDIMENTATION RATE: Sed Rate: 35 mm/hr — ABNORMAL HIGH (ref 0–32)

## 2020-08-08 LAB — C-REACTIVE PROTEIN: CRP: 3 mg/L (ref 0–10)

## 2020-08-08 NOTE — Telephone Encounter (Signed)
Please advise 

## 2020-08-08 NOTE — Telephone Encounter (Signed)
Pt calling back states she is experiencing pain & pt states not taking medicine for pain until advised it is safe to take both medicines. Please advise.

## 2020-08-08 NOTE — Telephone Encounter (Signed)
Pt called asking is it safe to take diclofenac & prednisone at the same time. Please advise [phone].

## 2020-08-09 NOTE — Telephone Encounter (Signed)
It is safe to take both medications however both medications can increase the risk for stomach upset/gastritis.  Patient should make sure that he eats before he takes either of these medications and to notify the office if he develops issues with stomach pain, nausea, black stools or blood in the stools while on the medications.

## 2020-08-12 NOTE — Telephone Encounter (Signed)
Left voice mail to call back 

## 2020-09-10 DIAGNOSIS — Z0289 Encounter for other administrative examinations: Secondary | ICD-10-CM

## 2020-09-17 NOTE — Progress Notes (Deleted)
NEUROLOGY CONSULTATION NOTE  Theresa Mooney MRN: [id number] DOB: 1981-01-28  Referring provider: Marcy Siren, DO Primary care provider: Marcy Siren, DO  Reason for consult:  migraines   Subjective:  Theresa Mooney is a 39 year old ***-handed female who presents for migraines.  History supplemented by referring provider's notes.  History of migraines ***.  Symptoms are aggravated by salt.  ***  CT head without contrast on 05/28/2020 personally reviewed was normal.   She has history of chronic pain, including neck pain, myalgias, polyarthralgias, back, chest as well as paresthesias down the right mid arm.  Previously thought to have fibromyalgia.   Labs from 08/06/2020 include negative ANA, sed rate 35, CRP 3, CK 31,   Current NSAIDS/analgesics:  *** Current triptans:  *** Current ergotamine:  *** Current anti-emetic:  *** Current muscle relaxants:  *** Current Antihypertensive medications:  *** Current Antidepressant medications:  *** Current Anticonvulsant medications:  *** Current anti-CGRP:  *** Current Vitamins/Herbal/Supplements:  *** Current Antihistamines/Decongestants:  *** Other therapy:  *** Hormone/birth control:  *** Other medications:  ***  Past NSAIDS/analgesics:  naproxen Past abortive triptans:  Sumatriptan 50mg  tab Past abortive ergotamine:  *** Past muscle relaxants:  Robaxin Past anti-emetic:  *** Past antihypertensive medications:  *** Past antidepressant medications:  *** Past anticonvulsant medications:  *** Past anti-CGRP:  *** Past vitamins/Herbal/Supplements:  *** Past antihistamines/decongestants:  *** Other past therapies:  ***  Caffeine:  *** Alcohol:  *** Smoker:  *** Diet:  *** Exercise:  *** Depression:  ***; Anxiety:  *** Other pain:  *** Sleep hygiene:  *** Family history of headache:  ***      PAST MEDICAL HISTORY: Past Medical History:  Diagnosis Date  . Asthma   . 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 Ectopic pregnancy   . Elevated glucose   . Herpes 2002  . Infection   . Migraine   . Mixed hyperlipidemia   . Nicotine dependence   . Trichimoniasis 12-2010  . Urinary calculi     PAST SURGICAL HISTORY: Past Surgical History:  Procedure Laterality Date  . DILATION AND CURETTAGE OF UTERUS    . LAPAROSCOPY  11/27/2011   Procedure: LAPAROSCOPY OPERATIVE;  Surgeon: 01/27/2012, MD;  Location: [facility];  Service: Gynecology;  Laterality: Bilateral;  . SALPINGECTOMY     Bilateral for twin ectopic preg.   . TUBAL LIGATION  12-05-2010    MEDICATIONS: Current Outpatient Medications on File Prior to Visit  Medication Sig Dispense Refill  . albuterol (VENTOLIN HFA) 108 (90 Base) MCG/ACT inhaler Inhale 2 puffs into the lungs every 6 (six) hours as needed for wheezing or shortness of breath. 18 g 1  . Alum & Mag Hydroxide-Simeth (GI COCKTAIL) SUSP suspension Take 15-30 mLs by mouth every 2 (two) hours as needed.    . Boric Acid CRYS Place 600 mg vaginally at bedtime. Use vaginally every night for two weeks then twice a week 500 g 5  . diclofenac (VOLTAREN) 75 MG EC tablet Take 1 tablet (75 mg total) by mouth 2 (two) times daily. 60 tablet 0  . omeprazole (PRILOSEC) 20 MG capsule Take 1 capsule (20 mg total) by mouth daily. 14 capsule 0  . sucralfate (CARAFATE) 1 g tablet Take 1 tablet (1 g total) by mouth 4 (four) times daily -  with meals and at bedtime. 60 tablet 1  . Vitamin D, Ergocalciferol, (DRISDOL) 1.25 MG (50000 UNIT) CAPS capsule Take 1 capsule (50,000 Units total) by  mouth every 7 (seven) days. 4 capsule 2  . [DISCONTINUED] SUMAtriptan (IMITREX) 50 MG tablet Take 1 tablet (50 mg total) by mouth every 2 (two) hours as needed for migraine. May repeat in 2 hours if headache persists or recurs. (Patient not taking: Reported on 07/02/2020) 10 tablet 0   No current facility-administered medications on file prior to visit.    ALLERGIES: Allergies  Allergen Reactions  . Hydrocodone  Nausea Only    Dizziness  . Penicillins     Did it involve swelling of the face/tongue/throat, SOB, or low BP? Y Did it involve sudden or severe rash/hives, skin peeling, or any reaction on the inside of your mouth or nose? N Did you need to seek medical attention at a hospital or doctor's office? Y When did it last happen?Over 1 month If all above answers are "NO", may proceed with cephalosporin use.      FAMILY HISTORY: Family History  Problem Relation Age of Onset  . Hypertension Mother   . Diabetes Maternal Grandmother   . Heart disease Maternal Grandmother   . Hypertension Maternal Grandmother   . Diabetes Maternal Grandfather   . Heart disease Maternal Grandfather   . Hypertension Maternal Grandfather   . Hyperlipidemia Daughter   . Breast cancer Maternal Aunt   . Anesthesia problems Neg Hx    ***.  SOCIAL HISTORY: Social History   Socioeconomic History  . Marital status: Single    Spouse name: Not on file  . Number of children: Not on file  . Years of education: Not on file  . Highest education level: Not on file  Occupational History  . Not on file  Tobacco Use  . Smoking status: Never Smoker  . Smokeless tobacco: Never Used  Vaping Use  . Vaping Use: Never used  Substance and Sexual Activity  . Alcohol use: Not Currently    Alcohol/week: 1.0 standard drink    Types: 1 Glasses of wine per week    Comment: social  . Drug use: No  . Sexual activity: Yes    Birth control/protection: Surgical  Other Topics Concern  . Not on file  Social History Narrative  . Not on file   Social Determinants of Health   Financial Resource Strain: Not on file  Food Insecurity: Not on file  Transportation Needs: Not on file  Physical Activity: Not on file  Stress: Not on file  Social Connections: Not on file  Intimate Partner Violence: Not on file    Objective:  *** General: No acute distress.  Patient appears well-groomed.   Head:   Normocephalic/atraumatic Eyes:  fundi examined but not visualized Neck: supple, no paraspinal tenderness, full range of motion Back: No paraspinal tenderness Heart: regular rate and rhythm Lungs: Clear to auscultation bilaterally. Vascular: No carotid bruits. Neurological Exam: Mental status: alert and oriented to person, place, and time, recent and remote memory intact, fund of knowledge intact, attention and concentration intact, speech fluent and not dysarthric, language intact. Cranial nerves: CN I: not tested CN II: pupils equal, round and reactive to light, visual fields intact CN III, IV, VI:  full range of motion, no nystagmus, no ptosis CN V: facial sensation intact. CN VII: upper and lower face symmetric CN VIII: hearing intact CN IX, X: gag intact, uvula midline CN XI: sternocleidomastoid and trapezius muscles intact CN XII: tongue midline Bulk & Tone: normal, no fasciculations. Motor:  muscle strength 5/5 throughout Sensation:  Pinprick, temperature and vibratory sensation intact. Deep Tendon  Reflexes:  2+ throughout,  toes downgoing.   Finger to nose testing:  Without dysmetria.   Heel to shin:  Without dysmetria.   Gait:  Normal station and stride.  Romberg negative.  Assessment/Plan:   ***    Thank you for allowing me to take part in the care of this patient.  Shon Millet, DO  CC: Marcy Siren, DO

## 2020-09-18 ENCOUNTER — Ambulatory Visit: Payer: 59 | Admitting: Neurology

## 2020-09-22 ENCOUNTER — Encounter: Payer: Self-pay | Admitting: Neurology

## 2020-09-23 ENCOUNTER — Telehealth: Payer: Self-pay | Admitting: Internal Medicine

## 2020-09-23 DIAGNOSIS — E559 Vitamin D deficiency, unspecified: Secondary | ICD-10-CM

## 2020-09-23 NOTE — Telephone Encounter (Signed)
Patient called in requesting re-fills of her vitamin D prescription. Please advise.

## 2020-10-07 MED ORDER — VITAMIN D (ERGOCALCIFEROL) 1.25 MG (50000 UNIT) PO CAPS: 50000.0000 [IU] | ORAL_CAPSULE | ORAL | 0 refills | Status: DC

## 2020-10-07 NOTE — Telephone Encounter (Signed)
1 month supply RF sent w/ note that pt needs appt for vit d check to ensure correct dose, ATC pt to inform of this x2, UTLM, no VM set up on either phone #s.

## 2020-10-27 ENCOUNTER — Ambulatory Visit: Payer: Medicaid Other | Admitting: Internal Medicine

## 2020-11-06 ENCOUNTER — Other Ambulatory Visit: Payer: Self-pay

## 2020-11-06 ENCOUNTER — Encounter: Payer: Self-pay | Admitting: Internal Medicine

## 2020-11-06 ENCOUNTER — Other Ambulatory Visit (HOSPITAL_COMMUNITY)
Admission: RE | Admit: 2020-11-06 | Discharge: 2020-11-06 | Disposition: A | Discharge status: Home / Self Care | Payer: Medicaid Other | Source: Ambulatory Visit | Attending: Internal Medicine | Admitting: Internal Medicine

## 2020-11-06 ENCOUNTER — Ambulatory Visit (INDEPENDENT_AMBULATORY_CARE_PROVIDER_SITE_OTHER): Payer: Medicaid Other | Admitting: Internal Medicine

## 2020-11-06 VITALS — BP 114/70 | HR 77 | Temp 98.2°F | Resp 18 | Ht 59.0 in | Wt 207.0 lb

## 2020-11-06 DIAGNOSIS — J4520 Mild intermittent asthma, uncomplicated: Secondary | ICD-10-CM | POA: Diagnosis not present

## 2020-11-06 DIAGNOSIS — N898 Other specified noninflammatory disorders of vagina: Secondary | ICD-10-CM | POA: Diagnosis present

## 2020-11-06 DIAGNOSIS — E559 Vitamin D deficiency, unspecified: Secondary | ICD-10-CM

## 2020-11-06 DIAGNOSIS — Z124 Encounter for screening for malignant neoplasm of cervix: Secondary | ICD-10-CM | POA: Insufficient documentation

## 2020-11-06 MED ORDER — ALBUTEROL SULFATE HFA 108 (90 BASE) MCG/ACT IN AERS: 2.0000 | INHALATION_SPRAY | Freq: Four times a day (QID) | RESPIRATORY_TRACT | 1 refills | Status: DC | PRN

## 2020-11-06 NOTE — Progress Notes (Signed)
-  Due for pap, also has BV concerns  -RF on albuterol inhaler

## 2020-11-06 NOTE — Progress Notes (Signed)
  Subjective:    Theresa Mooney - 40 y.o. female MRN [id number]  Date of birth: Feb 28, 1981  HPI  Theresa Mooney is here for follow up. Has completed Rx for Vit D supplements. Concerned she might have BV and wants to be checked for this due to some vaginal discharge and odor.      Health Maintenance:  Health Maintenance Due  Topic Date Due  . PAP SMEAR-Modifier  10/21/2020    -  reports that she has never smoked. She has never used smokeless tobacco. - Review of Systems: Per HPI. - Past Medical History: Patient Active Problem List   Diagnosis Date Noted  . Migraine without status migrainosus, not intractable 06/23/2020  . h/o postpartum BTL  12/13/2011  . S/P ectopic pregnancy 12/13/2011   - Medications: reviewed and updated   Objective:   Physical Exam BP 114/70 (BP Location: Right Arm, Patient Position: Sitting, Cuff Size: Large)   Pulse 77   Temp 98.2 F (36.8 C) (Oral)   Resp 18   Ht 4\' 11"  (1.499 m)   Wt 207 lb (93.9 kg)   SpO2 98%   BMI 41.81 kg/m  Physical Exam Constitutional:      General: She is not in acute distress.    Appearance: She is not diaphoretic.  Cardiovascular:     Rate and Rhythm: Normal rate.  Pulmonary:     Effort: Pulmonary effort is normal. No respiratory distress.  Genitourinary:    Comments: GU/GYN: Exam performed in the presence of a chaperone. External genitalia within normal limits.  Vaginal mucosa pink, moist, normal rugae.  Nonfriable cervix without lesions, no discharge or bleeding noted on speculum exam.  Bimanual exam revealed normal, nongravid uterus.  No cervical motion tenderness. No adnexal masses bilaterally.    Musculoskeletal:        General: Normal range of motion.  Skin:    General: Skin is warm and dry.  Neurological:     Mental Status: She is alert and oriented to person, place, and time.  Psychiatric:        Mood and Affect: Affect normal.        Judgment: Judgment normal.            Assessment & Plan:    1. Vitamin D deficiency Vit D level 12.8 in Sept 2021. Has completed 50,000 IU weekly. Follow up.  - VITAMIN D 25 Hydroxy (Vit-D Deficiency, Fractures)  2. Screening for cervical cancer - Cytology - PAP([facility])  3. Vaginal discharge - Cervicovaginal ancillary only  4. Mild intermittent asthma, unspecified whether complicated - albuterol (VENTOLIN HFA) 108 (90 Base) MCG/ACT inhaler; Inhale 2 puffs into the lungs every 6 (six) hours as needed for wheezing or shortness of breath.  Dispense: 18 g; Refill: 1   06-28-1998, D.O. 11/06/2020, 10:57 AM Primary Care at [facility]

## 2020-11-07 ENCOUNTER — Telehealth: Payer: Self-pay | Admitting: Internal Medicine

## 2020-11-07 LAB — VITAMIN D 25 HYDROXY (VIT D DEFICIENCY, FRACTURES): Vit D, 25-Hydroxy: 24.4 ng/mL — ABNORMAL LOW (ref 30.0–100.0)

## 2020-11-07 LAB — CERVICOVAGINAL ANCILLARY ONLY
Bacterial Vaginitis (gardnerella): POSITIVE — AB
Candida Glabrata: NEGATIVE
Candida Vaginitis: NEGATIVE
Chlamydia: NEGATIVE
Comment: NEGATIVE
Comment: NEGATIVE
Comment: NEGATIVE
Comment: NEGATIVE
Comment: NEGATIVE
Comment: NORMAL
Neisseria Gonorrhea: NEGATIVE
Trichomonas: NEGATIVE

## 2020-11-07 LAB — CYTOLOGY - PAP
Adequacy: ABSENT
Comment: NEGATIVE
Diagnosis: NEGATIVE
Diagnosis: REACTIVE
High risk HPV: NEGATIVE

## 2020-11-07 NOTE — Telephone Encounter (Signed)
Spoke w/ pt regarding her labs

## 2020-11-07 NOTE — Telephone Encounter (Signed)
Pt is calling due to Vitamin D results showing LOW. Pt wants to know from PCP if she needs to take additional medication for that and if so if it can be sent to the pharmacy for the pt. Please advise and thank you

## 2020-11-12 ENCOUNTER — Other Ambulatory Visit: Payer: Self-pay | Admitting: Internal Medicine

## 2020-11-12 MED ORDER — METRONIDAZOLE 500 MG PO TABS: 500.0000 mg | ORAL_TABLET | Freq: Two times a day (BID) | ORAL | 0 refills | Status: DC

## 2020-12-18 ENCOUNTER — Ambulatory Visit: Payer: 59 | Admitting: Physician Assistant

## 2020-12-23 ENCOUNTER — Ambulatory Visit: Payer: Medicaid Other | Admitting: Physician Assistant

## 2020-12-25 ENCOUNTER — Ambulatory Visit: Payer: Medicaid Other | Admitting: Physician Assistant

## 2020-12-31 ENCOUNTER — Ambulatory Visit: Payer: Medicaid Other | Admitting: Physician Assistant

## 2020-12-31 ENCOUNTER — Encounter: Payer: Self-pay | Admitting: Physician Assistant

## 2021-01-05 ENCOUNTER — Emergency Department (HOSPITAL_BASED_OUTPATIENT_CLINIC_OR_DEPARTMENT_OTHER)
Admission: EM | Admit: 2021-01-05 | Discharge: 2021-01-05 | Disposition: A | Discharge status: Home / Self Care | Payer: Medicaid Other | Attending: Emergency Medicine | Admitting: Emergency Medicine

## 2021-01-05 ENCOUNTER — Encounter (HOSPITAL_BASED_OUTPATIENT_CLINIC_OR_DEPARTMENT_OTHER): Payer: Self-pay | Admitting: Emergency Medicine

## 2021-01-05 ENCOUNTER — Other Ambulatory Visit: Payer: Self-pay

## 2021-01-05 DIAGNOSIS — I951 Orthostatic hypotension: Secondary | ICD-10-CM | POA: Diagnosis not present

## 2021-01-05 DIAGNOSIS — J45909 Unspecified asthma, uncomplicated: Secondary | ICD-10-CM | POA: Insufficient documentation

## 2021-01-05 DIAGNOSIS — R55 Syncope and collapse: Secondary | ICD-10-CM | POA: Diagnosis present

## 2021-01-05 LAB — CBC WITH DIFFERENTIAL/PLATELET
Abs Immature Granulocytes: 0.01 10*3/uL (ref 0.00–0.07)
Basophils Absolute: 0 10*3/uL (ref 0.0–0.1)
Basophils Relative: 0 %
Eosinophils Absolute: 0 10*3/uL (ref 0.0–0.5)
Eosinophils Relative: 0 %
HCT: 39.4 % (ref 36.0–46.0)
Hemoglobin: 12.3 g/dL (ref 12.0–15.0)
Immature Granulocytes: 0 %
Lymphocytes Relative: 30 %
Lymphs Abs: 2 10*3/uL (ref 0.7–4.0)
MCH: 27 pg (ref 26.0–34.0)
MCHC: 31.2 g/dL (ref 30.0–36.0)
MCV: 86.6 fL (ref 80.0–100.0)
Monocytes Absolute: 0.6 10*3/uL (ref 0.1–1.0)
Monocytes Relative: 9 %
Neutro Abs: 4 10*3/uL (ref 1.7–7.7)
Neutrophils Relative %: 61 %
Platelets: 337 10*3/uL (ref 150–400)
RBC: 4.55 MIL/uL (ref 3.87–5.11)
RDW: 13.8 % (ref 11.5–15.5)
WBC: 6.6 10*3/uL (ref 4.0–10.5)
nRBC: 0 % (ref 0.0–0.2)

## 2021-01-05 LAB — COMPREHENSIVE METABOLIC PANEL
ALT: 10 U/L (ref 0–44)
AST: 21 U/L (ref 15–41)
Albumin: 4.5 g/dL (ref 3.5–5.0)
Alkaline Phosphatase: 78 U/L (ref 38–126)
Anion gap: 10 (ref 5–15)
BUN: 16 mg/dL (ref 6–20)
CO2: 24 mmol/L (ref 22–32)
Calcium: 9.7 mg/dL (ref 8.9–10.3)
Chloride: 101 mmol/L (ref 98–111)
Creatinine, Ser: 0.96 mg/dL (ref 0.44–1.00)
GFR, Estimated: >60 mL/min (ref >60)
Glucose, Bld: 90 mg/dL (ref 70–99)
Potassium: 5 mmol/L (ref 3.5–5.1)
Sodium: 135 mmol/L (ref 135–145)
Total Bilirubin: 0.6 mg/dL (ref 0.3–1.2)
Total Protein: 8.5 g/dL — ABNORMAL HIGH (ref 6.5–8.1)

## 2021-01-05 LAB — URINALYSIS, ROUTINE W REFLEX MICROSCOPIC
Bilirubin Urine: NEGATIVE
Glucose, UA: NEGATIVE mg/dL
Ketones, ur: 15 mg/dL — AB
Leukocytes,Ua: NEGATIVE
Nitrite: NEGATIVE
Specific Gravity, Urine: 1.025 (ref 1.005–1.030)
pH: 5.5 (ref 5.0–8.0)

## 2021-01-05 LAB — PREGNANCY, URINE: Preg Test, Ur: NEGATIVE

## 2021-01-05 MED ORDER — SODIUM CHLORIDE 0.9 % IV BOLUS
1000.0000 mL | Freq: Once | INTRAVENOUS | Status: AC
  Administered 2021-01-05: 1000 mL via INTRAVENOUS

## 2021-01-05 MED ORDER — SODIUM CHLORIDE 0.9 % IV SOLN: INTRAVENOUS | Status: DC

## 2021-01-05 MED ORDER — KETOROLAC TROMETHAMINE 30 MG/ML IJ SOLN
30.0000 mg | Freq: Once | INTRAMUSCULAR | Status: AC
  Administered 2021-01-05: 30 mg via INTRAVENOUS
  Filled 2021-01-05: qty 1

## 2021-01-05 NOTE — ED Triage Notes (Signed)
Per ems pt had 2 episodes where she passed out at work  Today witnessed,  Hit her lip when she feel one time  States has not eaten today and has been under a lot of stress

## 2021-01-05 NOTE — ED Provider Notes (Signed)
[facility] EMERGENCY DEPT Provider Note   CSN: [id number] Arrival date & time: 01/05/21  1616     History Chief Complaint  Patient presents with  . Altered Mental Status    Reve Theresa Mooney is a 40 y.o. female.  Pt presents to the ED today with syncope.  Pt said she did not eat or sleep well over the week end.  She did not eat today.  She went to work and felt ok when she started.  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  She said she started to feel really hot and felt like she was going to pass out.  She left the line and went to the water fountain and then passed out.  She tried to get up and passed out again.  At that point, the EMS was called.  Pt denies any fever.  She has had some intermittent abdominal pain.        Past Medical History:  Diagnosis Date  . Asthma   . 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 Ectopic pregnancy   . Elevated glucose   . Herpes 2002  . Infection   . Migraine   . Mixed hyperlipidemia   . Nicotine dependence   . Trichimoniasis 12-2010  . Urinary calculi     Patient Active Problem List   Diagnosis Date Noted  . Migraine without status migrainosus, not intractable 06/23/2020  . h/o postpartum BTL  12/13/2011  . S/P ectopic pregnancy 12/13/2011    Past Surgical History:  Procedure Laterality Date  . DILATION AND CURETTAGE OF UTERUS    . LAPAROSCOPY  11/27/2011   Procedure: LAPAROSCOPY OPERATIVE;  Surgeon: Catalina Antigua, MD;  Location: [facility];  Service: Gynecology;  Laterality: Bilateral;  . SALPINGECTOMY     Bilateral for twin ectopic preg.   . TUBAL LIGATION  12-05-2010     OB History    Gravida  7   Para  5   Term      Preterm      AB  2   Living  5     SAB      IAB  1   Ectopic  1   Multiple      Live Births  5           Family History  Problem Relation Age of Onset  . Hypertension Mother   . Diabetes Maternal Grandmother   . Heart disease Maternal Grandmother   .  Hypertension Maternal Grandmother   . Diabetes Maternal Grandfather   . Heart disease Maternal Grandfather   . Hypertension Maternal Grandfather   . Hyperlipidemia Daughter   . Breast cancer Maternal Aunt   . Anesthesia problems Neg Hx     Social History   Tobacco Use  . Smoking status: Never Smoker  . Smokeless tobacco: Never Used  Vaping Use  . Vaping Use: Never used  Substance Use Topics  . Alcohol use: Yes    Alcohol/week: 1.0 standard drink    Types: 1 Glasses of wine per week    Comment: social  . Drug use: Yes    Types: Marijuana    Home Medications Prior to Admission medications   Medication Sig Start Date End Date Taking? Authorizing Provider  albuterol (VENTOLIN HFA) 108 (90 Base) MCG/ACT inhaler Inhale 2 puffs into the lungs every 6 (six) hours as needed for wheezing or shortness of breath. 11/06/20   Arvilla Market, DO  Alum & Mag  Hydroxide-Simeth (GI COCKTAIL) SUSP suspension Take 15-30 mLs by mouth every 2 (two) hours as needed. 05/22/20   [provider]  Boric Acid CRYS Place 600 mg vaginally at bedtime. Use vaginally every night for two weeks then twice a week 07/02/20   Constant, Peggy, MD  diclofenac (VOLTAREN) 75 MG EC tablet Take 1 tablet (75 mg total) by mouth 2 (two) times daily. 08/06/20   Arvilla Market, DO  metroNIDAZOLE (FLAGYL) 500 MG tablet Take 1 tablet (500 mg total) by mouth 2 (two) times daily. 11/12/20   Arvilla Market, DO  omeprazole (PRILOSEC) 20 MG capsule Take 1 capsule (20 mg total) by mouth daily. 05/19/20   Robinson, [place] N, PA-C  sucralfate (CARAFATE) 1 g tablet Take 1 tablet (1 g total) by mouth 4 (four) times daily -  with meals and at bedtime. 05/07/20   Arvilla Market, DO  Vitamin D, Ergocalciferol, (DRISDOL) 1.25 MG (50000 UNIT) CAPS capsule Take 1 capsule (50,000 Units total) by mouth every 7 (seven) days. 10/07/20   Arvilla Market, DO  SUMAtriptan (IMITREX) 50 MG tablet Take  1 tablet (50 mg total) by mouth every 2 (two) hours as needed for migraine. May repeat in 2 hours if headache persists or recurs. Patient not taking: Reported on 07/02/2020 06/23/20 07/29/20  Mayers, Kasandra Knudsen, PA-C    Allergies    Hydrocodone and Penicillins  Review of Systems   Review of Systems  Neurological: Positive for syncope.  All other systems reviewed and are negative.   Physical Exam Updated Vital Signs BP 97/63   Pulse 73   Temp 98.4 F (36.9 C) (Oral)   Resp 18   Ht 4\' 11"  (1.499 m)   Wt 104.3 kg   SpO2 100%   BMI 46.45 kg/m   Physical Exam Vitals and nursing note reviewed.  Constitutional:      Appearance: Normal appearance.  HENT:     Head: Normocephalic and atraumatic.     Right Ear: External ear normal.     Left Ear: External ear normal.     Nose: Nose normal.     Mouth/Throat:     Mouth: Mucous membranes are dry.  Eyes:     Extraocular Movements: Extraocular movements intact.     Conjunctiva/sclera: Conjunctivae normal.     Pupils: Pupils are equal, round, and reactive to light.  Cardiovascular:     Rate and Rhythm: Normal rate and regular rhythm.     Pulses: Normal pulses.     Heart sounds: Normal heart sounds.  Pulmonary:     Effort: Pulmonary effort is normal.     Breath sounds: Normal breath sounds.  Abdominal:     General: Abdomen is flat. Bowel sounds are normal.     Palpations: Abdomen is soft.  Musculoskeletal:        General: Normal range of motion.     Cervical back: Normal range of motion and neck supple.  Skin:    General: Skin is warm.     Capillary Refill: Capillary refill takes less than 2 seconds.  Neurological:     General: No focal deficit present.     Mental Status: She is alert and oriented to person, place, and time.  Psychiatric:        Mood and Affect: Mood normal.        Behavior: Behavior normal.        Thought Content: Thought content normal.        Judgment: Judgment  normal.     ED Results / Procedures /  Treatments   Labs (all labs ordered are listed, but only abnormal results are displayed) Labs Reviewed  COMPREHENSIVE METABOLIC PANEL - Abnormal; Notable for the following components:      Result Value   Total Protein 8.5 (*)    All other components within normal limits  URINALYSIS, ROUTINE W REFLEX MICROSCOPIC - Abnormal; Notable for the following components:   Hgb urine dipstick MODERATE (*)    Ketones, ur 15 (*)    Protein, ur TRACE (*)    All other components within normal limits  CBC WITH DIFFERENTIAL/PLATELET  PREGNANCY, URINE  CBG MONITORING, ED    EKG EKG Interpretation  Date/Time:  Monday January 05 2021 16:21:56 EDT Ventricular Rate:  74 PR Interval:  179 QRS Duration: 87 QT Interval:  564 QTC Calculation: 626 R Axis:   26 Text Interpretation: Sinus rhythm Abnormal R-wave progression, early transition Borderline T abnormalities, anterior leads Prolonged QT interval No significant change since last tracing Report dictated, transcription pending Reconfirmed by Jacalyn Lefevre [id number]) on 01/05/2021 5:23:45 PM   Radiology No results found.  Procedures Procedures   Medications Ordered in ED Medications  sodium chloride 0.9 % bolus 1,000 mL (1,000 mLs Intravenous New Bag/Given 01/05/21 1717)    And  0.9 %  sodium chloride infusion (has no administration in time range)  sodium chloride 0.9 % bolus 1,000 mL (1,000 mLs Intravenous New Bag/Given 01/05/21 1859)  ketorolac (TORADOL) 30 MG/ML injection 30 mg (30 mg Intravenous Given 01/05/21 1901)    ED Course  I have reviewed the triage vital signs and the nursing notes.  Pertinent labs & imaging results that were available during my care of the patient were reviewed by me and considered in my medical decision making (see chart for details).    MDM Rules/Calculators/A&P                          Pt is feeling better after fluids.  She likely had a syncopal event because of dehydration and orthostasis.  Pt encouraged to  drink a lot of fluid and to try to eat and to sleep.  Pt is stable for d/c.  Return if worse. F/u with pcp. Final Clinical Impression(s) / ED Diagnoses Final diagnoses:  Orthostatic syncope    Rx / DC Orders ED Discharge Orders    None       Jacalyn Lefevre, MD 01/05/21 2025

## 2021-01-05 NOTE — ED Notes (Signed)
Pt stated that during orthostatic vitals (supine), she felt "lightheaded". Pt stated that her "head is pounding over her eyes". Will inform Dr. Particia Nearing.

## 2021-01-05 NOTE — ED Notes (Signed)
Pt placed in supine position for orthostatic vitals. Orthostatic vitals will be started around 18:30.

## 2021-01-05 NOTE — ED Notes (Signed)
Gave pt graham crackers and water, per Dr. Particia Nearing.

## 2021-01-12 ENCOUNTER — Ambulatory Visit: Payer: Medicaid Other | Admitting: Physician Assistant

## 2021-01-14 ENCOUNTER — Other Ambulatory Visit: Payer: Self-pay

## 2021-01-14 ENCOUNTER — Encounter: Payer: Self-pay | Admitting: Internal Medicine

## 2021-01-14 ENCOUNTER — Ambulatory Visit (INDEPENDENT_AMBULATORY_CARE_PROVIDER_SITE_OTHER): Payer: Medicaid Other | Admitting: Internal Medicine

## 2021-01-14 VITALS — BP 108/76 | HR 63 | Temp 97.5°F | Resp 16 | Wt 203.0 lb

## 2021-01-14 DIAGNOSIS — R55 Syncope and collapse: Secondary | ICD-10-CM

## 2021-01-14 DIAGNOSIS — E559 Vitamin D deficiency, unspecified: Secondary | ICD-10-CM | POA: Diagnosis not present

## 2021-01-14 DIAGNOSIS — G8929 Other chronic pain: Secondary | ICD-10-CM

## 2021-01-14 DIAGNOSIS — M2550 Pain in unspecified joint: Secondary | ICD-10-CM | POA: Diagnosis not present

## 2021-01-14 MED ORDER — DICLOFENAC SODIUM 75 MG PO TBEC: 75.0000 mg | DELAYED_RELEASE_TABLET | Freq: Two times a day (BID) | ORAL | 0 refills | Status: DC

## 2021-01-14 NOTE — Progress Notes (Signed)
Generalized pain, 6/10  Request RF on diclofenac  Urinary concerns

## 2021-01-14 NOTE — Patient Instructions (Signed)
Google disability doctors in the area or call the Social Security office to ask who does the exams in the area.

## 2021-01-14 NOTE — Progress Notes (Signed)
  Subjective:    Theresa Mooney - 40 y.o. female MRN [id number]  Date of birth: March 02, 1981  HPI  Theresa Mooney is here for hospital follow up. Was seen in ER on 4/11 for syncopal event related to dehydration and orthostatic hypotension. Received IVFs and improved. Discharged in stable condition and instructed to follow up with PCP.   Health Maintenance:  Health Maintenance Due  Topic Date Due  . COVID-19 Vaccine (3 - Booster for Pfizer series) 01/09/2021    -  reports that she has never smoked. She has never used smokeless tobacco. - Review of Systems: Per HPI. - Past Medical History: Patient Active Problem List   Diagnosis Date Noted  . Migraine without status migrainosus, not intractable 06/23/2020  . h/o postpartum BTL  12/13/2011  . S/P ectopic pregnancy 12/13/2011   - Medications: reviewed and updated   Objective:   Physical Exam BP 108/76   Pulse 63   Temp (!) 97.5 F (36.4 C)   Resp 16   Wt 203 lb (92.1 kg)   LMP 01/09/2021   SpO2 98%   BMI 41.00 kg/m  Physical Exam Constitutional:      General: She is not in acute distress.    Appearance: She is not diaphoretic.  HENT:     Head: Normocephalic and atraumatic.  Eyes:     Conjunctiva/sclera: Conjunctivae normal.  Cardiovascular:     Rate and Rhythm: Normal rate and regular rhythm.     Heart sounds: Normal heart sounds. No murmur heard.   Pulmonary:     Effort: Pulmonary effort is normal. No respiratory distress.     Breath sounds: Normal breath sounds.  Musculoskeletal:        General: Normal range of motion.  Skin:    General: Skin is warm and dry.  Neurological:     Mental Status: She is alert and oriented to person, place, and time.  Psychiatric:        Mood and Affect: Affect normal.        Judgment: Judgment normal.            Assessment & Plan:   1. Syncope, unspecified syncope type Likely related to dehydration and poor PO intake of the food for 2 days prior and then standing in hot  work environment. Patient now feeling well.   2. Chronic pain of multiple joints Unknown etiology. Has previously had mildly ESR with result of 35. Had tried at that time to check RF as well but unfortunately did not result. Will repeat ESR and obtain RF. Refill NSAID. If all unremarkable, would then consider fibromyalgia and discuss starting Cymbalta.  - diclofenac (VOLTAREN) 75 MG EC tablet; Take 1 tablet (75 mg total) by mouth 2 (two) times daily.  Dispense: 60 tablet; Refill: 0 - Sedimentation Rate - Rheumatoid factor  3. Vitamin D deficiency Last Vit D 24.4 in Feb 2022. She is taking 5000 IU daily but feels less energy than when was taking higher supplementation. Will monitor.  - VITAMIN D 25 Hydroxy (Vit-D Deficiency, Fractures)   Phill Myron, D.O. 01/14/2021, 1:48 PM Primary Care at [facility]

## 2021-01-15 ENCOUNTER — Other Ambulatory Visit: Payer: Self-pay | Admitting: Internal Medicine

## 2021-01-15 ENCOUNTER — Ambulatory Visit: Payer: Medicaid Other | Admitting: Physician Assistant

## 2021-01-15 ENCOUNTER — Telehealth: Payer: Self-pay | Admitting: Internal Medicine

## 2021-01-15 DIAGNOSIS — R700 Elevated erythrocyte sedimentation rate: Secondary | ICD-10-CM

## 2021-01-15 DIAGNOSIS — E559 Vitamin D deficiency, unspecified: Secondary | ICD-10-CM

## 2021-01-15 LAB — VITAMIN D 25 HYDROXY (VIT D DEFICIENCY, FRACTURES): Vit D, 25-Hydroxy: 21.3 ng/mL — ABNORMAL LOW (ref 30.0–100.0)

## 2021-01-15 LAB — RHEUMATOID FACTOR: Rheumatoid fact SerPl-aCnc: <10 IU/mL (ref <14.0)

## 2021-01-15 LAB — SEDIMENTATION RATE: Sed Rate: 45 mm/hr — ABNORMAL HIGH (ref 0–32)

## 2021-01-15 MED ORDER — VITAMIN D (ERGOCALCIFEROL) 1.25 MG (50000 UNIT) PO CAPS: 50000.0000 [IU] | ORAL_CAPSULE | ORAL | 0 refills | Status: DC

## 2021-01-15 NOTE — Telephone Encounter (Signed)
Pt called wanting to talk with nurse about her lab results. I informed pt that provider & nurse were not in the office to discuss her concerns. Please follow up with pt regarding her issue.

## 2021-01-15 NOTE — Telephone Encounter (Signed)
Unable to release results to patient for Dr. Earlene Plater has not replied to them. Will follow up with patient as soon as recommendation are provided.  Left voicemail with this information.

## 2021-01-16 ENCOUNTER — Telehealth: Payer: Self-pay | Admitting: Internal Medicine

## 2021-01-16 NOTE — Telephone Encounter (Signed)
Pt is calling about lab results. Pt informed me she spoke with nurse today but forgot to ask her a question about the results. Please follow up pt regarding her concerns.

## 2021-01-16 NOTE — Telephone Encounter (Signed)
Patient name and DOB verified. Spoke to patient regarding result note per Dr. Earlene Plater. Advisded the Rheumatology would be calling for an appt. W/i the next 2 weeks. No message from the office at this time in referral note.

## 2021-01-16 NOTE — Telephone Encounter (Signed)
Spoke to patient

## 2021-01-16 NOTE — Telephone Encounter (Signed)
Pt called returning nurse Phone call about lab results. Please follow up with pt regarding her concerns. Her cell is [phone].

## 2021-01-19 ENCOUNTER — Telehealth: Payer: Self-pay | Admitting: Internal Medicine

## 2021-01-19 NOTE — Telephone Encounter (Signed)
Patient had concerns with urinary sx's- Feels that BV is coming back. Has appt with urology tomorrow. Will address concerns at appt.   Concerns with pain that she has going on. Rheumatology referral placed.   Taking Rx for pain  Wanted to know if there were concerns for cancer. Advised patient there were no concerns according to lab test and needs to f/u with specialist.

## 2021-01-19 NOTE — Telephone Encounter (Signed)
Pt stated she has a question about results that were already discussed with her. Please call patient.

## 2021-01-20 ENCOUNTER — Encounter: Payer: Self-pay | Admitting: Physician Assistant

## 2021-01-20 ENCOUNTER — Other Ambulatory Visit: Payer: Medicaid Other

## 2021-01-20 ENCOUNTER — Ambulatory Visit (INDEPENDENT_AMBULATORY_CARE_PROVIDER_SITE_OTHER): Payer: Medicaid Other | Admitting: Physician Assistant

## 2021-01-20 ENCOUNTER — Other Ambulatory Visit: Payer: Self-pay

## 2021-01-20 VITALS — BP 110/79 | HR 76 | Ht 59.0 in | Wt 202.0 lb

## 2021-01-20 DIAGNOSIS — N760 Acute vaginitis: Secondary | ICD-10-CM

## 2021-01-20 DIAGNOSIS — R3129 Other microscopic hematuria: Secondary | ICD-10-CM | POA: Diagnosis not present

## 2021-01-20 DIAGNOSIS — R109 Unspecified abdominal pain: Secondary | ICD-10-CM

## 2021-01-20 DIAGNOSIS — B9689 Other specified bacterial agents as the cause of diseases classified elsewhere: Secondary | ICD-10-CM

## 2021-01-20 LAB — URINALYSIS, COMPLETE
Bilirubin, UA: NEGATIVE
Glucose, UA: NEGATIVE
Ketones, UA: NEGATIVE
Leukocytes,UA: NEGATIVE
Nitrite, UA: NEGATIVE
Protein,UA: NEGATIVE
Specific Gravity, UA: 1.01 (ref 1.005–1.030)
Urobilinogen, Ur: 0.2 mg/dL (ref 0.2–1.0)
pH, UA: 5 (ref 5.0–7.5)

## 2021-01-20 LAB — MICROSCOPIC EXAMINATION: Epithelial Cells (non renal): >10 /hpf — AB (ref 0–10)

## 2021-01-20 NOTE — Progress Notes (Signed)
01/20/2021 11:26 AM   Derya [name] [birth year] [id number]  CC: Chief Complaint  Patient presents with  . Hematuria  . Follow-up    HPI: LEO WEYANDT is a 40 y.o. female with PMH recurrent BV and microscopic hematuria with negative cystoscopy with Dr. Lonna Cobb in 2021 who presents today for symptom recheck.  Today she reports a weeks-long history of back and bilateral flank pain that is intermittent, stabbing, and aching in nature.  It is exacerbated with bending over, lifting, and pushing heavy objects.  She has been taking Tylenol for symptomatic relief, which she says helps with her symptoms.  She has a history of chronic pain. She denies fever, chills, and vomiting, but states she does typically have nausea associated with her menstrual cycle.  She requests a note to excuse her from work for the next 2 days secondary to her pain.  Additionally, she is concerned that she may be developing BV.    She underwent CT stone study on 05/01/2020 with punctate nonobstructing bilateral renal stones without evidence of hydronephrosis or mass.  In-office UA today positive for 2+ blood; urine microscopy with 3-10 RBCs/HPF and >10 epithelial cells/hpf.   PMH: Past Medical History:  Diagnosis Date  . Asthma   . 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 Ectopic pregnancy   . Elevated glucose   . Herpes 2002  . Infection   . Migraine   . Mixed hyperlipidemia   . Nicotine dependence   . Trichimoniasis 12-2010  . Urinary calculi     Surgical History: Past Surgical History:  Procedure Laterality Date  . DILATION AND CURETTAGE OF UTERUS    . LAPAROSCOPY  11/27/2011   Procedure: LAPAROSCOPY OPERATIVE;  Surgeon: Catalina Antigua, MD;  Location: [facility];  Service: Gynecology;  Laterality: Bilateral;  . SALPINGECTOMY     Bilateral for twin ectopic preg.   . TUBAL LIGATION  12-05-2010    Home Medications:  Allergies as of 01/20/2021      Reactions   Hydrocodone Nausea Only   Dizziness    Penicillins    Did it involve swelling of the face/tongue/throat, SOB, or low BP? Y Did it involve sudden or severe rash/hives, skin peeling, or any reaction on the inside of your mouth or nose? N Did you need to seek medical attention at a hospital or doctor's office? Y When did it last happen?Over 1 month If all above answers are "NO", may proceed with cephalosporin use.      Medication List       Accurate as of January 20, 2021 11:26 AM. If you have any questions, ask your nurse or doctor.        albuterol 108 (90 Base) MCG/ACT inhaler Commonly known as: VENTOLIN HFA Inhale 2 puffs into the lungs every 6 (six) hours as needed for wheezing or shortness of breath.   Boric Acid [place] 600 mg vaginally at bedtime. Use vaginally every night for two weeks then twice a week   diclofenac 75 MG EC tablet Commonly known as: VOLTAREN Take 1 tablet (75 mg total) by mouth 2 (two) times daily.   gi cocktail Susp suspension Take 15-30 mLs by mouth every 2 (two) hours as needed.   metroNIDAZOLE 500 MG tablet Commonly known as: Flagyl Take 1 tablet (500 mg total) by mouth 2 (two) times daily.   omeprazole 20 MG capsule Commonly known as: PRILOSEC Take 1 capsule (20 mg total) by mouth daily.   sucralfate 1 g  tablet Commonly known as: Carafate Take 1 tablet (1 g total) by mouth 4 (four) times daily -  with meals and at bedtime.   Vitamin D (Ergocalciferol) 1.25 MG (50000 UNIT) Caps capsule Commonly known as: DRISDOL Take 1 capsule (50,000 Units total) by mouth every 7 (seven) days.       Allergies:  Allergies  Allergen Reactions  . Hydrocodone Nausea Only    Dizziness  . Penicillins     Did it involve swelling of the face/tongue/throat, SOB, or low BP? Y Did it involve sudden or severe rash/hives, skin peeling, or any reaction on the inside of your mouth or nose? N Did you need to seek medical attention at a hospital or doctor's office? Y When did it last  happen?Over 1 month If all above answers are "NO", may proceed with cephalosporin use.      Family History: Family History  Problem Relation Age of Onset  . Hypertension Mother   . Diabetes Maternal Grandmother   . Heart disease Maternal Grandmother   . Hypertension Maternal Grandmother   . Diabetes Maternal Grandfather   . Heart disease Maternal Grandfather   . Hypertension Maternal Grandfather   . Hyperlipidemia Daughter   . Breast cancer Maternal Aunt   . Anesthesia problems Neg Hx     Social History:   reports that she has never smoked. She has never used smokeless tobacco. She reports current alcohol use of about 1.0 standard drink of alcohol per week. She reports current drug use. Drug: Marijuana.  Physical Exam: BP 110/79   Pulse 76   Ht 4\' 11"  (1.499 m)   Wt 202 lb (91.6 kg)   LMP 01/09/2021   BMI 40.80 kg/m   Constitutional:  Alert and oriented, no acute distress, nontoxic appearing HEENT: [place], AT Cardiovascular: No clubbing, cyanosis, or edema Respiratory: Normal respiratory effort, no increased work of breathing Skin: No rashes, bruises or suspicious lesions Neurologic: Grossly intact, no focal deficits, moving all 4 extremities Psychiatric: Normal mood and affect  Laboratory Data: Results for orders placed or performed in visit on 01/20/21  Microscopic Examination   Urine  Result Value Ref Range   WBC, UA 0-5 0 - 5 /hpf   RBC 3-10 (A) 0 - 2 /hpf   Epithelial Cells (non renal) >10 (A) 0 - 10 /hpf   Bacteria, UA Few None seen/Few  Urinalysis, Complete  Result Value Ref Range   Specific Gravity, UA 1.010 1.005 - 1.030   pH, UA 5.0 5.0 - 7.5   Color, UA Yellow Yellow   Appearance Ur Cloudy (A) Clear   Leukocytes,UA Negative Negative   Protein,UA Negative Negative/Trace   Glucose, UA Negative Negative   Ketones, UA Negative Negative   RBC, UA 2+ (A) Negative   Bilirubin, UA Negative Negative   Urobilinogen, Ur 0.2 0.2 - 1.0 mg/dL   Nitrite, UA  Negative Negative   Microscopic Examination See below:    Assessment & Plan:   1. Microscopic hematuria Remains present on UA today.  It does not appear she has ever been tested for atypical urinary infections and I will complete this today.  Otherwise, we discussed that she will need annual follow-up with urine rechecks and repeat hematuria work-up for persistent hematuria.  She expressed understanding. - Urinalysis, Complete - Mycoplasma / ureaplasma culture  2. Flank pain With positional and exertional exacerbation, I have a low suspicion that this pain is urologic in nature.  Regardless, she has a history of kidney stones  on prior CT and so I offered her stat renal ultrasound today to evaluate her for hydronephrosis.  She declined this, and so I will not be writing a work note today as I cannot verify this as a urologic problem.  Renal ultrasound scheduled for 02/10/2021. - US RENAL; Future  3. Bacterial vaginosis Encouraged her to follow-up with her GYN regarding management of recurrent BV.  She expressed understanding.  Return in about 1 year (around 01/20/2022) for Annual MH f/u with UA.  Carman Ching, PA-C  [facility] [address] [place], [place] [address] [phone]

## 2021-01-22 ENCOUNTER — Encounter (HOSPITAL_BASED_OUTPATIENT_CLINIC_OR_DEPARTMENT_OTHER): Payer: Self-pay | Admitting: Emergency Medicine

## 2021-01-22 ENCOUNTER — Ambulatory Visit (HOSPITAL_COMMUNITY)
Admission: EM | Admit: 2021-01-22 | Discharge: 2021-01-23 | Disposition: A | Discharge status: Home / Self Care | Payer: Medicaid Other | Attending: Psychiatry | Admitting: Psychiatry

## 2021-01-22 ENCOUNTER — Emergency Department (HOSPITAL_BASED_OUTPATIENT_CLINIC_OR_DEPARTMENT_OTHER)
Admission: EM | Admit: 2021-01-22 | Discharge: 2021-01-22 | Payer: Medicaid Other | Attending: Emergency Medicine | Admitting: Emergency Medicine

## 2021-01-22 ENCOUNTER — Other Ambulatory Visit: Payer: Self-pay

## 2021-01-22 DIAGNOSIS — F329 Major depressive disorder, single episode, unspecified: Secondary | ICD-10-CM | POA: Diagnosis not present

## 2021-01-22 DIAGNOSIS — Z818 Family history of other mental and behavioral disorders: Secondary | ICD-10-CM | POA: Insufficient documentation

## 2021-01-22 DIAGNOSIS — R4584 Anhedonia: Secondary | ICD-10-CM | POA: Insufficient documentation

## 2021-01-22 DIAGNOSIS — F332 Major depressive disorder, recurrent severe without psychotic features: Secondary | ICD-10-CM | POA: Diagnosis not present

## 2021-01-22 DIAGNOSIS — R45851 Suicidal ideations: Secondary | ICD-10-CM | POA: Diagnosis not present

## 2021-01-22 DIAGNOSIS — Z79899 Other long term (current) drug therapy: Secondary | ICD-10-CM | POA: Diagnosis not present

## 2021-01-22 DIAGNOSIS — G8929 Other chronic pain: Secondary | ICD-10-CM | POA: Insufficient documentation

## 2021-01-22 DIAGNOSIS — J45909 Unspecified asthma, uncomplicated: Secondary | ICD-10-CM | POA: Diagnosis not present

## 2021-01-22 DIAGNOSIS — Z639 Problem related to primary support group, unspecified: Secondary | ICD-10-CM | POA: Insufficient documentation

## 2021-01-22 DIAGNOSIS — Z20822 Contact with and (suspected) exposure to covid-19: Secondary | ICD-10-CM | POA: Diagnosis not present

## 2021-01-22 DIAGNOSIS — Z9151 Personal history of suicidal behavior: Secondary | ICD-10-CM | POA: Insufficient documentation

## 2021-01-22 DIAGNOSIS — Z7289 Other problems related to lifestyle: Secondary | ICD-10-CM | POA: Diagnosis not present

## 2021-01-22 LAB — RAPID URINE DRUG SCREEN, HOSP PERFORMED
Amphetamines: NOT DETECTED
Barbiturates: NOT DETECTED
Benzodiazepines: NOT DETECTED
Cocaine: NOT DETECTED
Opiates: NOT DETECTED
Tetrahydrocannabinol: NOT DETECTED

## 2021-01-22 LAB — COMPREHENSIVE METABOLIC PANEL
ALT: 8 U/L (ref 0–44)
AST: 13 U/L — ABNORMAL LOW (ref 15–41)
Albumin: 4.5 g/dL (ref 3.5–5.0)
Alkaline Phosphatase: 97 U/L (ref 38–126)
Anion gap: 9 (ref 5–15)
BUN: 11 mg/dL (ref 6–20)
CO2: 27 mmol/L (ref 22–32)
Calcium: 10.1 mg/dL (ref 8.9–10.3)
Chloride: 103 mmol/L (ref 98–111)
Creatinine, Ser: 0.97 mg/dL (ref 0.44–1.00)
GFR, Estimated: >60 mL/min (ref >60)
Glucose, Bld: 94 mg/dL (ref 70–99)
Potassium: 3.8 mmol/L (ref 3.5–5.1)
Sodium: 139 mmol/L (ref 135–145)
Total Bilirubin: 0.5 mg/dL (ref 0.3–1.2)
Total Protein: 7.9 g/dL (ref 6.5–8.1)

## 2021-01-22 LAB — CBC
HCT: 44.1 % (ref 36.0–46.0)
Hemoglobin: 13.6 g/dL (ref 12.0–15.0)
MCH: 27.2 pg (ref 26.0–34.0)
MCHC: 30.8 g/dL (ref 30.0–36.0)
MCV: 88.2 fL (ref 80.0–100.0)
Platelets: 327 10*3/uL (ref 150–400)
RBC: 5 MIL/uL (ref 3.87–5.11)
RDW: 13.6 % (ref 11.5–15.5)
WBC: 4.1 10*3/uL (ref 4.0–10.5)
nRBC: 0 % (ref 0.0–0.2)

## 2021-01-22 LAB — RESP PANEL BY RT-PCR (FLU A&B, COVID) ARPGX2
Influenza A by PCR: NEGATIVE
Influenza B by PCR: NEGATIVE
SARS Coronavirus 2 by RT PCR: NEGATIVE

## 2021-01-22 LAB — SALICYLATE LEVEL: Salicylate Lvl: <7 mg/dL — ABNORMAL LOW (ref 7.0–30.0)

## 2021-01-22 LAB — ETHANOL: Alcohol, Ethyl (B): <10 mg/dL (ref <10)

## 2021-01-22 LAB — ACETAMINOPHEN LEVEL: Acetaminophen (Tylenol), Serum: <10 ug/mL — ABNORMAL LOW (ref 10–30)

## 2021-01-22 LAB — PREGNANCY, URINE: Preg Test, Ur: NEGATIVE

## 2021-01-22 MED ORDER — ACETAMINOPHEN 325 MG PO TABS
650.0000 mg | ORAL_TABLET | Freq: Four times a day (QID) | ORAL | Status: DC | PRN
  Administered 2021-01-22: 650 mg via ORAL
  Filled 2021-01-22: qty 2

## 2021-01-22 MED ORDER — ALUM & MAG HYDROXIDE-SIMETH 200-200-20 MG/5ML PO SUSP: 30.0000 mL | ORAL | Status: DC | PRN

## 2021-01-22 MED ORDER — HYDROXYZINE HCL 25 MG PO TABS: 25.0000 mg | ORAL_TABLET | Freq: Three times a day (TID) | ORAL | Status: DC | PRN

## 2021-01-22 MED ORDER — TRAZODONE HCL 50 MG PO TABS: 50.0000 mg | ORAL_TABLET | Freq: Every evening | ORAL | Status: DC | PRN

## 2021-01-22 MED ORDER — MAGNESIUM HYDROXIDE 400 MG/5ML PO SUSP: 30.0000 mL | Freq: Every day | ORAL | Status: DC | PRN

## 2021-01-22 MED ORDER — SERTRALINE HCL 50 MG PO TABS: 50.0000 mg | ORAL_TABLET | Freq: Every day | ORAL | Status: DC

## 2021-01-22 NOTE — ED Triage Notes (Signed)
Pt via pov with sister from home with suicidal ideation. Pt states she feels like giving up. Pt states this started yesterday, but this is not the first time. Pt alert & oriented, flat affect.

## 2021-01-22 NOTE — ED Notes (Addendum)
Pt c/o pain due to a "sinus headache" and requested tylenol. Pain 9/10 with visible distress via pained facial expressions. Pt administered tylenol and a blueberry nutrigrain bar.

## 2021-01-22 NOTE — ED Provider Notes (Signed)
Behavioral Health Admission H&P [facility](FBC & OBS)  Date: 01/22/21 Patient Name: Theresa Mooney MRN: [id number] Chief Complaint: No chief complaint on file.     Diagnoses:  Final diagnoses:  Severe episode of recurrent major depressive disorder, without psychotic features (HCC)    HPI: [facility].  Theresa Mooney reports "I have been going through depression for a lot of reasons, financial situations, problems with my fianc, my 38 year old son has drug problems and has been tearing up my house, my kids grades are not looking good, I am losing focus and giving up on everything."  Theresa Mooney reports suicidal ideation on yesterday, denies plan or intent.  Theresa Mooney denies suicidal ideations and homicidal ideations today.  Reports 1 prior suicide attempt at age 40 when Theresa Mooney intentionally overdosed on medication.  Theresa Mooney denies auditory visual hallucinations.  There is no evidence of delusional thought content and Theresa Mooney denies symptoms of paranoia.  Theresa Mooney is not currently followed by outpatient psychiatry.  Theresa Mooney reports seeing a counselor briefly at age 40.  Theresa Mooney is not prescribed any psychotropic medications at this time.  Theresa Mooney reports Theresa Mooney has also been experiencing diffuse and chronic pain recently.  Theresa Mooney reports Theresa Mooney takes pain medication, Diclofenac, anti-inflammatory medication to treat pain.  Theresa Mooney reports Theresa Mooney has an upcoming appointment to see a specialist, states "it is hard not knowing what is causing the pain."  Theresa Mooney reports Theresa Mooney feels that Theresa Mooney has not processed traumas including the recent death of her grandmother.  Theresa Mooney also reports her father completed suicide when Theresa Mooney was 40 years old by shooting himself in the head, this was triggered by father's inappropriate touching upset sister and subsequent social service investigation.  Patient assessed by nurse practitioner, Theresa Mooney is alert and oriented.  Theresa Mooney presents with depressed mood and tearful  affect.  Theresa Mooney is pleasant and cooperative during assessment.  Theresa Mooney resides in [place] with her 5 children, ages 1319, 2013, 3712, 6011, and 38 years old.  Theresa Mooney denies access to weapons.  Theresa Mooney is employed at [employer].  Theresa Mooney denies alcohol and substance use.  Theresa Mooney endorses "okay" sleep.    Patient offered support and encouragement.  Theresa Mooney reports her 38 year old son remains at home but her 304 younger children are currently with her mother.  PHQ 2-9:  Flowsheet Row Office [facility] Office Visit [facility]  Thoughts that you would be better off dead, or of hurting yourself in some way Not at all Not at all Not at all  PHQ-9 Total Score 5 0 3      Flowsheet Row ED from 01/22/2021 in [facility] ED from 01/05/2021 in [facility]  C-SSRS RISK CATEGORY High Risk No Risk       Total Time spent with patient: 30 minutes  Musculoskeletal  Strength & Muscle Tone: within normal limits Gait & Station: normal Patient leans: N/A  Psychiatric Specialty Exam  Presentation General Appearance: Casual; Appropriate for Environment  Eye Contact:Good  Speech:Clear and Coherent; Normal Rate  Speech Volume:Normal  Handedness:Right   Mood and Affect  Mood:Depressed  Affect:Appropriate; Congruent   Thought Process  Thought Processes:Coherent; Goal Directed  Descriptions of Associations:Intact  Orientation:Full (Time, Place and Person)  Thought Content:Logical    Hallucinations:Hallucinations: None  Ideas of Reference:None  Suicidal Thoughts:Suicidal Thoughts: No  Homicidal Thoughts:Homicidal  Thoughts: No   Sensorium  Memory:Immediate Good; Recent Good; Remote Good  Judgment:Fair  Insight:Fair   Executive Functions  Concentration:Good  Attention Span:Good  Recall:Good  Fund of  Knowledge:Good  Language:Good   Psychomotor Activity  Psychomotor Activity:Psychomotor Activity: Normal   Assets  Assets:Communication Skills; Desire for Improvement; Financial Resources/Insurance; Housing; Intimacy; Leisure Time; Resilience; Social Support; Talents/Skills; Transportation   Sleep  Sleep:Sleep: Fair   Nutritional Assessment (For OBS and FBC admissions only) Has the patient had a weight loss or gain of 10 pounds or more in the last 3 months?: No Has the patient had a decrease in food intake/or appetite?: No Does the patient have dental problems?: No Does the patient have eating habits or behaviors that may be indicators of an eating disorder including binging or inducing vomiting?: No Has the patient recently lost weight without trying?: No Has the patient been eating poorly because of a decreased appetite?: No Malnutrition Screening Tool Score: 0    Physical Exam Vitals and nursing note reviewed.  Constitutional:      Appearance: Theresa Mooney is well-developed.  HENT:     Head: Normocephalic.     Nose: Nose normal.  Cardiovascular:     Rate and Rhythm: Normal rate.  Pulmonary:     Effort: Pulmonary effort is normal.  Musculoskeletal:        General: Normal range of motion.  Neurological:     Mental Status: Theresa Mooney is alert and oriented to person, place, and time.  Psychiatric:        Attention and Perception: Attention and perception normal.        Mood and Affect: Affect normal. Mood is depressed.        Speech: Speech normal.        Behavior: Behavior normal. Behavior is cooperative.        Thought Content: Thought content normal.        Cognition and Memory: Cognition and memory normal.        Judgment: Judgment normal.    Review of Systems  Constitutional: Negative.   HENT: Negative.   Eyes: Negative.   Respiratory: Negative.   Cardiovascular: Negative.   Gastrointestinal: Negative.   Genitourinary: Negative.   Musculoskeletal: Negative.   Skin:  Negative.   Neurological: Negative.   Endo/Heme/Allergies: Negative.   Psychiatric/Behavioral: Positive for depression.    Blood pressure (!) 123/55, pulse 74, temperature 98.9 F (37.2 C), temperature source Oral, resp. rate 16, last menstrual period 01/09/2021, SpO2 100 %. There is no height or weight on file to calculate BMI.  Past Psychiatric History: None reported.  Is the patient at risk to self? No  Has the patient been a risk to self in the past 6 months? No .    Has the patient been a risk to self within the distant past? Yes   Is the patient a risk to others? No   Has the patient been a risk to others in the past 6 months? No   Has the patient been a risk to others within the distant past? No   Past Medical History:  Past Medical History:  Diagnosis Date  . Asthma   . 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 Ectopic pregnancy   . Elevated glucose   . Herpes 2002  . Infection   . Migraine   . Mixed hyperlipidemia   . Nicotine dependence   . Trichimoniasis 12-2010  . Urinary calculi     Past Surgical History:  Procedure  Laterality Date  . DILATION AND CURETTAGE OF UTERUS    . LAPAROSCOPY  11/27/2011   Procedure: LAPAROSCOPY OPERATIVE;  Surgeon: Catalina Antigua, MD;  Location: [facility];  Service: Gynecology;  Laterality: Bilateral;  . SALPINGECTOMY     Bilateral for twin ectopic preg.   . TUBAL LIGATION  12-05-2010    Family History:  Family History  Problem Relation Age of Onset  . Hypertension Mother   . Diabetes Maternal Grandmother   . Heart disease Maternal Grandmother   . Hypertension Maternal Grandmother   . Diabetes Maternal Grandfather   . Heart disease Maternal Grandfather   . Hypertension Maternal Grandfather   . Hyperlipidemia Daughter   . Breast cancer Maternal Aunt   . Anesthesia problems Neg Hx     Social History:  Social History   Socioeconomic History  . Marital status: Single    Spouse name: Not on file  . Number of children: Not  on file  . Years of education: Not on file  . Highest education level: Not on file  Occupational History  . Not on file  Tobacco Use  . Smoking status: Never Smoker  . Smokeless tobacco: Never Used  Vaping Use  . Vaping Use: Never used  Substance and Sexual Activity  . Alcohol use: Yes    Alcohol/week: 1.0 standard drink    Types: 1 Glasses of wine per week    Comment: social  . Drug use: Not Currently    Types: Marijuana    Comment: hasn't smoked in 2 months  . Sexual activity: Yes    Birth control/protection: Surgical  Other Topics Concern  . Not on file  Social History Narrative  . Not on file   Social Determinants of Health   Financial Resource Strain: Not on file  Food Insecurity: Not on file  Transportation Needs: Not on file  Physical Activity: Not on file  Stress: Not on file  Social Connections: Not on file  Intimate Partner Violence: Not on file    SDOH:  SDOH Screenings   Alcohol Screen: Not on file  Depression (PHQ2-9): Medium Risk  . PHQ-2 Score: 5  Financial Resource Strain: Not on file  Food Insecurity: Not on file  Housing: Not on file  Physical Activity: Not on file  Social Connections: Not on file  Stress: Not on file  Tobacco Use: Low Risk   . Smoking Tobacco Use: Never Smoker  . Smokeless Tobacco Use: Never Used  Transportation Needs: Not on file    Last Labs:  Admission on 01/22/2021, Discharged on 01/22/2021  Component Date Value Ref Range Status  . Sodium 01/22/2021 139  135 - 145 mmol/L Final  . Potassium 01/22/2021 3.8  3.5 - 5.1 mmol/L Final  . Chloride 01/22/2021 103  98 - 111 mmol/L Final  . CO2 01/22/2021 27  22 - 32 mmol/L Final  . Glucose, Bld 01/22/2021 94  70 - 99 mg/dL Final   Glucose reference range applies only to samples taken after fasting for at least 8 hours.  . BUN 01/22/2021 11  6 - 20 mg/dL Final  . Creatinine, Ser 01/22/2021 0.97  0.44 - 1.00 mg/dL Final  . Calcium 40/98/1191 10.1  8.9 - 10.3 mg/dL Final  .  Total Protein 01/22/2021 7.9  6.5 - 8.1 g/dL Final  . Albumin 47/82/9562 4.5  3.5 - 5.0 g/dL Final  . AST 13/04/6577 13* 15 - 41 U/L Final  . ALT 01/22/2021 8  0 - 44 U/L Final  .  Alkaline Phosphatase 01/22/2021 97  38 - 126 U/L Final  . Total Bilirubin 01/22/2021 0.5  0.3 - 1.2 mg/dL Final  . GFR, Estimated 01/22/2021 >60  >60 mL/min Final   Comment: (NOTE) Calculated using the CKD-EPI Creatinine Equation (2021)   . Anion gap 01/22/2021 9  5 - 15 Final   Performed at [facility], [address] [place], [place] [address]  . Alcohol, Ethyl (B) 01/22/2021 <10  <10 mg/dL Final   Comment: (NOTE) Lowest detectable limit for serum alcohol is 10 mg/dL.  For medical purposes only. Performed at [facility], [address] [place], [place] [address]   . Salicylate Lvl 01/22/2021 <7.0* 7.0 - 30.0 mg/dL Final   Performed at [facility], [address], [place], [place] [address]  . Acetaminophen (Tylenol), Serum 01/22/2021 <10* 10 - 30 ug/mL Final   Comment: (NOTE) Therapeutic concentrations vary significantly. A range of 10-30 ug/mL  may be an effective concentration for many patients. However, some  are best treated at concentrations outside of this range. Acetaminophen concentrations >150 ug/mL at 4 hours after ingestion  and >50 ug/mL at 12 hours after ingestion are often associated with  toxic reactions.  Performed at [facility], [address] [place], [place] [address]   . WBC 01/22/2021 4.1  4.0 - 10.5 K/uL Final  . RBC 01/22/2021 5.00  3.87 - 5.11 MIL/uL Final  . Hemoglobin 01/22/2021 13.6  12.0 - 15.0 g/dL Final  . HCT 65/78/4696 44.1  36.0 - 46.0 % Final  . MCV 01/22/2021 88.2  80.0 - 100.0 fL Final  . MCH 01/22/2021 27.2  26.0 - 34.0 pg Final  . MCHC 01/22/2021 30.8  30.0 - 36.0 g/dL Final  . RDW 29/52/8413 13.6  11.5 - 15.5 % Final  . Platelets 01/22/2021 327  150 - 400 K/uL Final  .  nRBC 01/22/2021 0.0  0.0 - 0.2 % Final   Performed at [facility], [address], [place], [place] [address]  . Opiates 01/22/2021 NONE DETECTED  NONE DETECTED Corrected  . Cocaine 01/22/2021 NONE DETECTED  NONE DETECTED Corrected  . Benzodiazepines 01/22/2021 NONE DETECTED  NONE DETECTED Corrected  . Amphetamines 01/22/2021 NONE DETECTED  NONE DETECTED Corrected  . Tetrahydrocannabinol 01/22/2021 NONE DETECTED  NONE DETECTED Final  . Barbiturates 01/22/2021 NONE DETECTED  NONE DETECTED Corrected   Comment: (NOTE) DRUG SCREEN FOR MEDICAL PURPOSES ONLY.  IF CONFIRMATION IS NEEDED FOR ANY PURPOSE, NOTIFY LAB WITHIN 5 DAYS.  LOWEST DETECTABLE LIMITS FOR URINE DRUG SCREEN Drug Class                     Cutoff (ng/mL) Amphetamine and metabolites    1000 Barbiturate and metabolites    200 Benzodiazepine                 200 Tricyclics and metabolites     300 Opiates and metabolites        300 Cocaine and metabolites        300 THC                            50 Performed at [facility], [address] [place], [place] [address] CORRECTED ON 04/28 AT 1654: PREVIOUSLY REPORTED AS NONE DETECTED   . Preg Test, Ur 01/22/2021 NEGATIVE  NEGATIVE Final   Comment:        THE SENSITIVITY OF THIS METHODOLOGY  IS >20 mIU/mL. Performed at [facility], [address], [place], [place] [address]   . SARS Coronavirus 2 by RT PCR 01/22/2021 NEGATIVE  NEGATIVE Final   Comment: (NOTE) SARS-CoV-2 target nucleic acids are NOT DETECTED.  The SARS-CoV-2 RNA is generally detectable in upper respiratory specimens during the acute phase of infection. The lowest concentration of SARS-CoV-2 viral copies this assay can detect is 138 copies/mL. A negative result does not preclude SARS-Cov-2 infection and should not be used as the sole basis for treatment or other patient management decisions. A negative result may occur with  improper specimen  collection/handling, submission of specimen other than nasopharyngeal swab, presence of viral mutation(s) within the areas targeted by this assay, and inadequate number of viral copies(<138 copies/mL). A negative result must be combined with clinical observations, patient history, and epidemiological information. The expected result is Negative.  Fact Sheet for Patients:  BloggerCourse.com  Fact Sheet for Healthcare Providers:  SeriousBroker.it  This test is no                          t yet approved or cleared by the [place] FDA and  has been authorized for detection and/or diagnosis of SARS-CoV-2 by FDA under an Emergency Use Authorization (EUA). This EUA will remain  in effect (meaning this test can be used) for the duration of the COVID-19 declaration under Section 564(b)(1) of the Act, 21 U.S.C.section 360bbb-3(b)(1), unless the authorization is terminated  or revoked sooner.      . Influenza A by PCR 01/22/2021 NEGATIVE  NEGATIVE Final  . Influenza B by PCR 01/22/2021 NEGATIVE  NEGATIVE Final   Comment: (NOTE) The Xpert Xpress SARS-CoV-2/FLU/RSV plus assay is intended as an aid in the diagnosis of influenza from Nasopharyngeal swab specimens and should not be used as a sole basis for treatment. Nasal washings and aspirates are unacceptable for Xpert Xpress SARS-CoV-2/FLU/RSV testing.  Fact Sheet for Patients: BloggerCourse.com  Fact Sheet for Healthcare Providers: SeriousBroker.it  This test is not yet approved or cleared by the [place] FDA and has been authorized for detection and/or diagnosis of SARS-CoV-2 by FDA under an Emergency Use Authorization (EUA). This EUA will remain in effect (meaning this test can be used) for the duration of the COVID-19 declaration under Section 564(b)(1) of the Act, 21 U.S.C. section 360bbb-3(b)(1), unless the authorization  is terminated or revoked.  Performed at [facility], [address], [place], [place] [address]   Office Visit on 01/20/2021  Component Date Value Ref Range Status  . Specific Gravity, UA 01/20/2021 1.010  1.005 - 1.030 Final  . pH, UA 01/20/2021 5.0  5.0 - 7.5 Final  . Color, UA 01/20/2021 Yellow  Yellow Final  . Appearance Ur 01/20/2021 Cloudy* Clear Final  . Leukocytes,UA 01/20/2021 Negative  Negative Final  . Protein,UA 01/20/2021 Negative  Negative/Trace Final  . Glucose, UA 01/20/2021 Negative  Negative Final  . Ketones, UA 01/20/2021 Negative  Negative Final  . RBC, UA 01/20/2021 2+* Negative Final  . Bilirubin, UA 01/20/2021 Negative  Negative Final  . Urobilinogen, Ur 01/20/2021 0.2  0.2 - 1.0 mg/dL Final  . Nitrite, UA 03/47/4259 Negative  Negative Final  . Microscopic Examination 01/20/2021 See below:   Final  . Ureaplasma urealyticum 01/20/2021 Comment  Negative Preliminary   Specimen has been received and testing has been initiated.  . Mycoplasma hominis Culture 01/20/2021 Comment  Negative Preliminary   Specimen has been received  and testing has been initiated.  . WBC, UA 01/20/2021 0-5  0 - 5 /hpf Final  . RBC 01/20/2021 3-10* 0 - 2 /hpf Final  . Epithelial Cells (non renal) 01/20/2021 >10* 0 - 10 /hpf Final  . Bacteria, UA 01/20/2021 Few  None seen/Few Final  Office Visit on 01/14/2021  Component Date Value Ref Range Status  . Vit D, 25-Hydroxy 01/14/2021 21.3* 30.0 - 100.0 ng/mL Final   Comment: Vitamin D deficiency has been defined by the Institute of Medicine and an Endocrine Society practice guideline as a level of serum 25-OH vitamin D less than 20 ng/mL (1,2). The Endocrine Society went on to further define vitamin D insufficiency as a level between 21 and 29 ng/mL (2). 1. IOM (Institute of Medicine). 2010. Dietary reference    intakes for calcium and D. [place]: The    [employer]. 2. Holick MF, [place],  Bischoff-Ferrari HA, et al.    Evaluation, treatment, and prevention of vitamin D    deficiency: an Endocrine Society clinical practice    guideline. JCEM. 2011 Jul; 96(7):1911-30.   Marland Kitchen Sed Rate 01/14/2021 45* 0 - 32 mm/hr Final  . Rhuematoid fact SerPl-aCnc 01/14/2021 <10.0  <14.0 IU/mL Final  Admission on 01/05/2021, Discharged on 01/05/2021  Component Date Value Ref Range Status  . WBC 01/05/2021 6.6  4.0 - 10.5 K/uL Final  . RBC 01/05/2021 4.55  3.87 - 5.11 MIL/uL Final  . Hemoglobin 01/05/2021 12.3  12.0 - 15.0 g/dL Final  . HCT 41/74/0814 39.4  36.0 - 46.0 % Final  . MCV 01/05/2021 86.6  80.0 - 100.0 fL Final  . MCH 01/05/2021 27.0  26.0 - 34.0 pg Final  . MCHC 01/05/2021 31.2  30.0 - 36.0 g/dL Final  . RDW 48/18/5631 13.8  11.5 - 15.5 % Final  . Platelets 01/05/2021 337  150 - 400 K/uL Final  . nRBC 01/05/2021 0.0  0.0 - 0.2 % Final  . Neutrophils Relative % 01/05/2021 61  % Final  . Neutro Abs 01/05/2021 4.0  1.7 - 7.7 K/uL Final  . Lymphocytes Relative 01/05/2021 30  % Final  . Lymphs Abs 01/05/2021 2.0  0.7 - 4.0 K/uL Final  . Monocytes Relative 01/05/2021 9  % Final  . Monocytes Absolute 01/05/2021 0.6  0.1 - 1.0 K/uL Final  . Eosinophils Relative 01/05/2021 0  % Final  . Eosinophils Absolute 01/05/2021 0.0  0.0 - 0.5 K/uL Final  . Basophils Relative 01/05/2021 0  % Final  . Basophils Absolute 01/05/2021 0.0  0.0 - 0.1 K/uL Final  . Immature Granulocytes 01/05/2021 0  % Final  . Abs Immature Granulocytes 01/05/2021 0.01  0.00 - 0.07 K/uL Final   Performed at [facility]  . Preg Test, Ur 01/05/2021 NEGATIVE  NEGATIVE Final   Comment:        THE SENSITIVITY OF THIS METHODOLOGY IS >20 mIU/mL. Performed at [facility]   . Sodium 01/05/2021 135  135 - 145 mmol/L Final  . Potassium 01/05/2021 5.0  3.5 - 5.1 mmol/L Final  . Chloride 01/05/2021 101  98 - 111 mmol/L Final  . CO2 01/05/2021 24  22 - 32 mmol/L Final  . Glucose, Bld  01/05/2021 90  70 - 99 mg/dL Final   Glucose reference range applies only to samples taken after fasting for at least 8 hours.  . BUN 01/05/2021 16  6 - 20 mg/dL Final  . Creatinine, Ser 01/05/2021 0.96  0.44 -  1.00 mg/dL Final  . Calcium 29/47/6546 9.7  8.9 - 10.3 mg/dL Final  . Total Protein 01/05/2021 8.5* 6.5 - 8.1 g/dL Final  . Albumin 50/35/4656 4.5  3.5 - 5.0 g/dL Final  . AST 81/27/5170 21  15 - 41 U/L Final  . ALT 01/05/2021 10  0 - 44 U/L Final  . Alkaline Phosphatase 01/05/2021 78  38 - 126 U/L Final  . Total Bilirubin 01/05/2021 0.6  0.3 - 1.2 mg/dL Final  . GFR, Estimated 01/05/2021 >60  >60 mL/min Final   Comment: (NOTE) Calculated using the CKD-EPI Creatinine Equation (2021)   . Anion gap 01/05/2021 10  5 - 15 Final   Performed at [facility]  . Color, Urine 01/05/2021 YELLOW  YELLOW Final  . APPearance 01/05/2021 CLEAR  CLEAR Final  . Specific Gravity, Urine 01/05/2021 1.025  1.005 - 1.030 Final  . pH 01/05/2021 5.5  5.0 - 8.0 Final  . Glucose, UA 01/05/2021 NEGATIVE  NEGATIVE mg/dL Final  . Hgb urine dipstick 01/05/2021 MODERATE* NEGATIVE Final  . Bilirubin Urine 01/05/2021 NEGATIVE  NEGATIVE Final  . Ketones, ur 01/05/2021 15* NEGATIVE mg/dL Final  . Protein, ur 01/74/9449 TRACE* NEGATIVE mg/dL Final  . Nitrite 67/59/1638 NEGATIVE  NEGATIVE Final  . Glori Luis 01/05/2021 NEGATIVE  NEGATIVE Final  . RBC / HPF 01/05/2021 0-5  0 - 5 RBC/hpf Final  . WBC, UA 01/05/2021 0-5  0 - 5 WBC/hpf Final  . Squamous Epithelial / LPF 01/05/2021 0-5  0 - 5 Final  . Mucus 01/05/2021 PRESENT   Final  . Hyaline Casts, UA 01/05/2021 PRESENT   Final   Performed at [facility]  Office Visit on 11/06/2020  Component Date Value Ref Range Status  . Vit D, 25-Hydroxy 11/06/2020 24.4* 30.0 - 100.0 ng/mL Final   Comment: Vitamin D deficiency has been defined by the Institute of Medicine and an Endocrine Society practice guideline as a level of serum  25-OH vitamin D less than 20 ng/mL (1,2). The Endocrine Society went on to further define vitamin D insufficiency as a level between 21 and 29 ng/mL (2). 1. IOM (Institute of Medicine). 2010. Dietary reference    intakes for calcium and D. [place]: The    [employer]. 2. Holick MF, Binkley [place], Bischoff-Ferrari HA, et al.    Evaluation, treatment, and prevention of vitamin D    deficiency: an Endocrine Society clinical practice    guideline. JCEM. 2011 Jul; 96(7):1911-30.   . Bacterial Vaginitis (gardnerella) 11/06/2020 Positive*  Final  . Candida Vaginitis 11/06/2020 Negative   Final  . Candida Glabrata 11/06/2020 Negative   Final  . Trichomonas 11/06/2020 Negative   Final  . Chlamydia 11/06/2020 Negative   Final  . Neisseria Gonorrhea 11/06/2020 Negative   Final  . Comment 11/06/2020 Normal Reference Range Candida Species - Negative   Final  . Comment 11/06/2020 Normal Reference Range Bacterial Vaginosis - Negative   Final  . Comment 11/06/2020 Normal Reference Range Candida Galbrata - Negative   Final  . Comment 11/06/2020 Normal Reference Range Trichomonas - Negative   Final  . Comment 11/06/2020 Normal Reference Ranger Chlamydia - Negative   Final  . Comment 11/06/2020 Normal Reference Range Neisseria Gonorrhea - Negative   Final  . High risk HPV 11/06/2020 Negative   Final  . Adequacy 11/06/2020 Satisfactory for evaluation; transformation zone component ABSENT.   Final  . Diagnosis 11/06/2020 - Negative for Intraepithelial Lesions or Malignancy (NILM)  Final  . Diagnosis 11/06/2020 - Benign reactive/reparative changes   Final  . Comment 11/06/2020 Normal Reference Range HPV - Negative   Final  Office Visit on 08/06/2020  Component Date Value Ref Range Status  . Sed Rate 08/06/2020 35* 0 - 32 mm/hr Final  . CRP 08/06/2020 3  0 - 10 mg/L Final  . Total CK 08/06/2020 31* 32 - 182 U/L Final  . ANA Titer 1 08/06/2020 Negative   Final   Comment:                                       Negative   <1:80                                      Borderline  1:80                                      Positive   >1:80 ICAP nomenclature: AC-0 For more information about Hep-2 cell patterns use ANApatterns.org, the official website for the International Consensus on Antinuclear Antibody (ANA) Patterns (ICAP).   . Rhuematoid fact SerPl-aCnc 08/06/2020 <10.0  0.0 - 13.9 IU/mL Final  . Cyclic Citrullin Peptide Ab 16/06/9603 6  0 - 19 units Final   Comment:                           Negative               <20                           Weak positive      20 - 39                           Moderate positive  40 - 59                           Strong positive        >59   Admission on 08/02/2020, Discharged on 08/02/2020  Component Date Value Ref Range Status  . Color, Urine 08/02/2020 YELLOW  YELLOW Final  . APPearance 08/02/2020 CLEAR  CLEAR Final  . Specific Gravity, Urine 08/02/2020 1.020  1.005 - 1.030 Final  . pH 08/02/2020 7.0  5.0 - 8.0 Final  . Glucose, UA 08/02/2020 NEGATIVE  NEGATIVE mg/dL Final  . Hgb urine dipstick 08/02/2020 SMALL* NEGATIVE Final  . Bilirubin Urine 08/02/2020 NEGATIVE  NEGATIVE Final  . Ketones, ur 08/02/2020 NEGATIVE  NEGATIVE mg/dL Final  . Protein, ur 54/05/8118 NEGATIVE  NEGATIVE mg/dL Final  . Nitrite 14/78/2956 NEGATIVE  NEGATIVE Final  . Glori Luis 08/02/2020 NEGATIVE  NEGATIVE Final   Performed at [facility], [address] [place], [place] [address]  . RBC / HPF 08/02/2020 0-5  0 - 5 RBC/hpf Final  . WBC, UA 08/02/2020 0-5  0 - 5 WBC/hpf Final  . Bacteria, UA 08/02/2020 MANY* NONE SEEN Final  . Squamous Epithelial / LPF 08/02/2020 0-5  0 - 5 Final  . Mucus 08/02/2020 PRESENT   Final   Performed  at [facility], [address] [place], [place] [address]    Allergies: Hydrocodone and Penicillins  PTA Medications: (Not in a hospital admission)   Medical Decision Making  Patient was medically  cleared in the emergency department.  Discussed starting Zoloft to address depressed mood.  Discussed side effects.  Patient in agreement with plan.  Medications: - Zoloft 50 mg daily -Hydroxyzine 25 mg 3 times daily as needed/anxiety -Trazodone 50 mg nightly as needed/sleep     Recommendations  Based on my evaluation the patient does not appear to have an emergency medical condition.  Patient reviewed with Dr. Bronwen Betters.  Patient will be placed in the continuous assessment area at [facility] for treatment and stabilization.  Theresa Mooney will be reassessed on 01/23/2021, disposition will be determined at that time.   Lenard Lance, FNP 01/22/21  7:21 PM

## 2021-01-22 NOTE — BHH Counselor (Signed)
Patient presents from [facility] reporting depression, trauma, and "I'm going through a lot." She reports SI yesterday, she denies this currently. She denies HI/AVH. She denies any substance use.  Patient is urgent.
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
## 2021-01-22 NOTE — ED Notes (Signed)
Pt presents as transfer from [facility] with complaint of suicidal ideations.  Pt has history of depression and suicide.  Denies HI or AVH.  Family and financial stressors noted.  Skin search completed.  Monitoring for safety.

## 2021-01-22 NOTE — ED Notes (Signed)
Pt is upset and crying@this  time. She writing on some paper some noted. She refused any medications for anxiety. Will continue to monitor for safety

## 2021-01-22 NOTE — ED Provider Notes (Signed)
[facility] EMERGENCY DEPT Provider Note   CSN: [id number] Arrival date & time: 01/22/21  1244     History Chief Complaint  Patient presents with  . Suicidal    Theresa Mooney is a 40 y.o. female.  The history is provided by the patient.  Mental Health Problem Presenting symptoms: depression and suicidal thoughts   Patient accompanied by:  Family member (sister) Degree of incapacity (severity):  Severe Onset quality:  Gradual Duration: long-standing, worse x 2 days. Timing:  Constant Progression:  Worsening Chronicity:  New Context: stressful life event   Context comment:  Dealing with health concerns, conflict with fiance, money trouble Treatment compliance:  Untreated Relieved by:  Nothing Worsened by:  Nothing Ineffective treatments:  None tried Associated symptoms: anhedonia   Associated symptoms: no abdominal pain, no chest pain and no weight change   Risk factors comment:  History of overdose in the past      Past Medical History:  Diagnosis Date  . Asthma   . 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 Ectopic pregnancy   . Elevated glucose   . Herpes 2002  . Infection   . Migraine   . Mixed hyperlipidemia   . Nicotine dependence   . Trichimoniasis 12-2010  . Urinary calculi     Patient Active Problem List   Diagnosis Date Noted  . Migraine without status migrainosus, not intractable 06/23/2020  . h/o postpartum BTL  12/13/2011  . S/P ectopic pregnancy 12/13/2011    Past Surgical History:  Procedure Laterality Date  . DILATION AND CURETTAGE OF UTERUS    . LAPAROSCOPY  11/27/2011   Procedure: LAPAROSCOPY OPERATIVE;  Surgeon: Catalina Antigua, MD;  Location: [facility];  Service: Gynecology;  Laterality: Bilateral;  . SALPINGECTOMY     Bilateral for twin ectopic preg.   . TUBAL LIGATION  12-05-2010     OB History    Gravida  7   Para  5   Term      Preterm      AB  2   Living  5     SAB      IAB  1   Ectopic  1    Multiple      Live Births  5           Family History  Problem Relation Age of Onset  . Hypertension Mother   . Diabetes Maternal Grandmother   . Heart disease Maternal Grandmother   . Hypertension Maternal Grandmother   . Diabetes Maternal Grandfather   . Heart disease Maternal Grandfather   . Hypertension Maternal Grandfather   . Hyperlipidemia Daughter   . Breast cancer Maternal Aunt   . Anesthesia problems Neg Hx     Social History   Tobacco Use  . Smoking status: Never Smoker  . Smokeless tobacco: Never Used  Vaping Use  . Vaping Use: Never used  Substance Use Topics  . Alcohol use: Yes    Alcohol/week: 1.0 standard drink    Types: 1 Glasses of wine per week    Comment: social  . Drug use: Not Currently    Types: Marijuana    Comment: hasn't smoked in 2 months    Home Medications Prior to Admission medications   Medication Sig Start Date End Date Taking? Authorizing Provider  albuterol (VENTOLIN HFA) 108 (90 Base) MCG/ACT inhaler Inhale 2 puffs into the lungs every 6 (six) hours as needed for wheezing or shortness of breath. 11/06/20  Arvilla Market, DO  Alum & Mag Hydroxide-Simeth (GI COCKTAIL) SUSP suspension Take 15-30 mLs by mouth every 2 (two) hours as needed. Patient not taking: Reported on 01/20/2021 05/22/20   [provider]  Boric Acid CRYS Place 600 mg vaginally at bedtime. Use vaginally every night for two weeks then twice a week Patient not taking: Reported on 01/20/2021 07/02/20   Constant, Peggy, MD  diclofenac (VOLTAREN) 75 MG EC tablet Take 1 tablet (75 mg total) by mouth 2 (two) times daily. 01/14/21   Arvilla Market, DO  metroNIDAZOLE (FLAGYL) 500 MG tablet Take 1 tablet (500 mg total) by mouth 2 (two) times daily. Patient not taking: Reported on 01/20/2021 11/12/20   Arvilla Market, DO  omeprazole (PRILOSEC) 20 MG capsule Take 1 capsule (20 mg total) by mouth daily. 05/19/20   Robinson, [place] N, PA-C   sucralfate (CARAFATE) 1 g tablet Take 1 tablet (1 g total) by mouth 4 (four) times daily -  with meals and at bedtime. 05/07/20   Arvilla Market, DO  Vitamin D, Ergocalciferol, (DRISDOL) 1.25 MG (50000 UNIT) CAPS capsule Take 1 capsule (50,000 Units total) by mouth every 7 (seven) days. 01/15/21   Arvilla Market, DO  SUMAtriptan (IMITREX) 50 MG tablet Take 1 tablet (50 mg total) by mouth every 2 (two) hours as needed for migraine. May repeat in 2 hours if headache persists or recurs. Patient not taking: Reported on 07/02/2020 06/23/20 07/29/20  Mayers, Cari S, PA-C    Allergies    Hydrocodone and Penicillins  Review of Systems   Review of Systems  Constitutional: Negative for chills and fever.  HENT: Negative for ear pain and sore throat.   Eyes: Negative for pain and visual disturbance.  Respiratory: Negative for cough and shortness of breath.   Cardiovascular: Negative for chest pain and palpitations.  Gastrointestinal: Negative for abdominal pain and vomiting.  Genitourinary: Negative for dysuria and hematuria.  Musculoskeletal: Positive for arthralgias and myalgias. Negative for back pain.       Chronic pain  Skin: Negative for color change and rash.  Neurological: Negative for seizures and syncope.  Psychiatric/Behavioral: Positive for suicidal ideas.  All other systems reviewed and are negative.   Physical Exam Updated Vital Signs BP 109/65 (BP Location: Left Arm)   Pulse 67   Temp 98.4 F (36.9 C) (Oral)   Resp 18   Ht 4\' 9"  (1.448 m)   Wt 91.6 kg   LMP 01/09/2021   SpO2 100%   BMI 43.71 kg/m   Physical Exam Vitals and nursing note reviewed.  HENT:     Head: Normocephalic and atraumatic.  Eyes:     General: No scleral icterus. Pulmonary:     Effort: Pulmonary effort is normal. No respiratory distress.  Musculoskeletal:     Cervical back: Normal range of motion.  Skin:    General: Skin is warm and dry.  Neurological:     Mental Status: She  is alert.  Psychiatric:        Attention and Perception: Attention normal.        Mood and Affect: Affect is blunt and flat.        Speech: Speech normal.        Behavior: Behavior normal.        Thought Content: Thought content normal.        Cognition and Memory: Cognition normal.        Judgment: Judgment normal.     ED  Results / Procedures / Treatments   Labs (all labs ordered are listed, but only abnormal results are displayed) Labs Reviewed  COMPREHENSIVE METABOLIC PANEL - Abnormal; Notable for the following components:      Result Value   AST 13 (*)    All other components within normal limits  SALICYLATE LEVEL - Abnormal; Notable for the following components:   Salicylate Lvl <7.0 (*)    All other components within normal limits  ACETAMINOPHEN LEVEL - Abnormal; Notable for the following components:   Acetaminophen (Tylenol), Serum <10 (*)    All other components within normal limits  SARS CORONAVIRUS 2 (TAT 6-24 HRS)  RESP PANEL BY RT-PCR (FLU A&B, COVID) ARPGX2  ETHANOL  CBC  RAPID URINE DRUG SCREEN, HOSP PERFORMED  PREGNANCY, URINE    EKG None  Radiology No results found.  Procedures Procedures   Medications Ordered in ED Medications - No data to display  ED Course  I have reviewed the triage vital signs and the nursing notes.  Pertinent labs & imaging results that were available during my care of the patient were reviewed by me and considered in my medical decision making (see chart for details).  Clinical Course as of 01/22/21 1512  Thu Jan 22, 2021  1510 I spoke to [facility] who will accept the patient at [facility]. [AW]    Clinical Course User Index [AW] Koleen Distance, MD   MDM Rules/Calculators/A&P                          Levada Schilling presented voluntarily to the ED under the care of her sister for longstanding depression and acute suicidal ideation.  She appears to be medically clear for behavioral health evaluation, and I think she would  be best served at the behavioral health urgent care. Final Clinical Impression(s) / ED Diagnoses Final diagnoses:  Suicidal ideation    Rx / DC Orders ED Discharge Orders    None       Koleen Distance, MD 01/22/21 1540

## 2021-01-22 NOTE — ED Notes (Signed)
Locker 5.  

## 2021-01-22 NOTE — ED Notes (Signed)
Pt to restroom to provide urine specimen

## 2021-01-22 NOTE — ED Notes (Signed)
This Rn attempted to get blood on this patient without success.

## 2021-01-23 NOTE — ED Notes (Signed)
Pt using the phone. 

## 2021-01-23 NOTE — Progress Notes (Signed)
Patient is alert and oriented X 4, denies SI, HI and AVH. Patient refused zoloft this morning. Patient is coherent and logical in speech, cooperative to the staff. Patient awaiting discharge, stating she would like to go home.

## 2021-01-23 NOTE — ED Notes (Signed)
Pt sleeping@thsi time. Breathing even and unlabored. Will continue to monitor for safety 

## 2021-01-23 NOTE — Discharge Instructions (Addendum)
Please come to [facility] (this facility) during walk in hours for appointment with psychiatrist for further medication management and for therapists for therapy.    Walk in hours are 8-11 AM Monday through Thursday for medication management.Child and adolescent psychiatrists are only available on Wednesdays and Thursdays during walk in hours.   Therapy walk in hours are Monday-Wednesday 8 AM-1PM and only 4 intakes are done a day.  It is first come, first -serve; it is best to arrive by 7:00 AM. On Friday from 1 pm to 4 pm for therapy intake only. Please arrive by 12:00 pm as it is  first come, first -serve.    When you arrive please go upstairs for your appointment. If you are unsure of where to go, inform the front desk that you are here for a walk in appointment and they will assist you with directions upstairs.  Address:  [address], in [place], [address] Ph: [phone]

## 2021-01-23 NOTE — ED Notes (Signed)
Patient alert and oriented, received AVS with follow up instructions. Patient denied SI, HI and AVH to RN and provider. Patient received all belongings from [facility] locker. Patient discharged home sister picked  patient up to take home.

## 2021-01-23 NOTE — ED Provider Notes (Signed)
FBC/OBS ASAP Discharge Summary  Date and Time: 01/23/2021 10:43 AM  Name: [place] Mo[place]  MRN:  [id number]   Discharge Diagnoses:  Final diagnoses:  Severe episode of recurrent major depressive disorder, without psychotic features (HCC)    Subjective:  Patient seen and chart reviewed. She recounts what brought her into the hospital yesterday and states that she was feeling depressed due to financial issues, conflict with fiancee and dealing with past traumas. She refused zoloft and she states that she is not interested in starting medication at this time because she is currently prescribed diclofenac and states that it makes her feel dizzy and discusses her concern for SE. Discussed that there are medication options that are available for treatment of depression and may help with pain (ie cymbalta), although declines at this time.She denies SI, plan or intent and cites her 5 children as reasons she would never harm herself and states that she had to deal with her father's suicide and would not do that to her children. She denies HI/AVH. She states that she does not have a current therapist but would like to be set up with one. Discussed open access hours at the [facility] as she is a [place] resident with medicaid; she expressed interest. She states that she feels safe for discharge and requests a return to work letter for Monday. She provides number for sister as below for collateral   Theresa [place] (sister) [phone] She got really stressed out over some bills, finances and a situation happened with her and her fiancee and it sent her into a spiral. She has had a history of this before. She has had a lot of life trauma that she never dealt with. She keeps having moments of severe depression, everytjme she ends a relationship, it gets to the point where she wants to kill herself. She has severe separation issues and that's how she responds and believes it is due to father's suicide. When father committed  suicide, she was admitted to a behavioral health hospital, this was back in 2000. Believes these feelings are related to not dealing with traumas and holding things in.  The situation with her fiance is not done and she is upet that he is not communicating with her and is upset that he is not there supporting her. Discussed that she does not meet inpatient criteria; verbalized understanding. Requests if she can have  an appointment scheduled for her before she leaves with a therapist so that she can ensure that she goes. She states that she has been trying to get L[place]wina to see a therapist. States that she has spoken with her manager and is willing to give her time off so that she can make sure that [place] makes it to her appointments. Discussed that patient could come in for walk in appointments during open access hours; verbalizes understanding and states that she will accompany her to an appointment next week   Stay Summary:   Patient presents voluntarily to [facility] behavioral health center for walk-in assessment on 01/22/21 .  Theresa Mo[place] reports "I have been going through depression for a lot of reasons, financial situations, problems with my fianc, my 38 year old son has drug problems and has been tearing up my house, my kids grades are not looking good, I am losing focus and giving up on everything." She reports suicidal ideation on yesterday, denies plan or intent.  She denies suicidal ideations and homicidal ideations today.  Reports 1 prior suicide attempt at age 40  when she intentionally overdosed on medication.  She denies auditory visual hallucinations.  There is no evidence of delusional thought content and she denies symptoms of paranoia.She is not currently followed by outpatient psychiatry.  She reports seeing a counselor briefly at age 49.  She is not prescribed any psychotropic medications at this time.  She reports she has also been experiencing diffuse and chronic pain recently.  She  reports she takes pain medication, Diclofenac, anti-inflammatory medication to treat pain.  She reports she has an upcoming appointment to see a specialist, states "it is hard not knowing what is causing the pain."  Theresa Mooney reports she feels that she has not processed traumas including the recent death of her grandmother.  She also reports her father completed suicide when she was 41 years old by shooting himself in the head, this was triggered by father's inappropriate touching upset sister and subsequent social service investigation.  [place] resides in [place] with her 5 children, ages 59, 65, 46, 33, and 48 years old.  She denies access to weapons.  She is employed at [employer].  She denies alcohol and substance use.  She endorses "okay" sleep.   Patient admitted for overnight observation. She was started on zoloft although declined stating that her diclofenac has been causing SE of dizziness. She expresses that she is not currently interested in medications at this time. Pt denied SI/HI/AVH the following day and requested outpatient resources (see above). Patient dishcharged to care of siste.r     Total Time spent with patient: 30 minutes  Past Psychiatric History: see H&P Past Medical History:  Past Medical History:  Diagnosis Date  . Asthma   . 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 Ectopic pregnancy   . Elevated glucose   . Herpes 2002  . Infection   . Migraine   . Mixed hyperlipidemia   . Nicotine dependence   . Trichimoniasis 12-2010  . Urinary calculi     Past Surgical History:  Procedure Laterality Date  . DILATION AND CURETTAGE OF UTERUS    . LAPAROSCOPY  11/27/2011   Procedure: LAPAROSCOPY OPERATIVE;  Surgeon: Catalina Antigua, MD;  Location: [facility];  Service: Gynecology;  Laterality: Bilateral;  . SALPINGECTOMY     Bilateral for twin ectopic preg.   . TUBAL LIGATION  12-05-2010   Family History:  Family History  Problem Relation Age of Onset  . Hypertension  Mother   . Diabetes Maternal Grandmother   . Heart disease Maternal Grandmother   . Hypertension Maternal Grandmother   . Diabetes Maternal Grandfather   . Heart disease Maternal Grandfather   . Hypertension Maternal Grandfather   . Hyperlipidemia Daughter   . Breast cancer Maternal Aunt   . Anesthesia problems Neg Hx    Family Psychiatric History: see H&P Social History:  Social History   Substance and Sexual Activity  Alcohol Use Yes  . Alcohol/week: 1.0 standard drink  . Types: 1 Glasses of wine per week   Comment: social     Social History   Substance and Sexual Activity  Drug Use Not Currently  . Types: Marijuana   Comment: hasn't smoked in 2 months    Social History   Socioeconomic History  . Marital status: Single    Spouse name: Not on file  . Number of children: Not on file  . Years of education: Not on file  . Highest education level: Not on file  Occupational History  . Not on file  Tobacco  Use  . Smoking status: Never Smoker  . Smokeless tobacco: Never Used  Vaping Use  . Vaping Use: Never used  Substance and Sexual Activity  . Alcohol use: Yes    Alcohol/week: 1.0 standard drink    Types: 1 Glasses of wine per week    Comment: social  . Drug use: Not Currently    Types: Marijuana    Comment: hasn't smoked in 2 months  . Sexual activity: Yes    Birth control/protection: Surgical  Other Topics Concern  . Not on file  Social History Narrative  . Not on file   Social Determinants of Health   Financial Resource Strain: Not on file  Food Insecurity: Not on file  Transportation Needs: Not on file  Physical Activity: Not on file  Stress: Not on file  Social Connections: Not on file   SDOH:  SDOH Screenings   Alcohol Screen: Not on file  Depression (PHQ2-9): Medium Risk  . PHQ-2 Score: 5  Financial Resource Strain: Not on file  Food Insecurity: Not on file  Housing: Not on file  Physical Activity: Not on file  Social Connections: Not on  file  Stress: Not on file  Tobacco Use: Low Risk   . Smoking Tobacco Use: Never Smoker  . Smokeless Tobacco Use: Never Used  Transportation Needs: Not on file    Has this patient used any form of tobacco in the last 30 days? (Cigarettes, Smokeless Tobacco, Cigars, and/or Pipes) Prescription not provided because: n/a  Current Medications:  Current Facility-Administered Medications  Medication Dose Route Frequency Provider Last Rate Last Admin  . acetaminophen (TYLENOL) tablet 650 mg  650 mg Oral Q6H PRN Lenard LanceAllen, Tina L, FNP   650 mg at 01/22/21 2348  . alum & mag hydroxide-simeth (MAALOX/MYLANTA) 200-200-20 MG/5ML suspension 30 mL  30 mL Oral Q4H PRN Lenard LanceAllen, Tina L, FNP      . hydrOXYzine (ATARAX/VISTARIL) tablet 25 mg  25 mg Oral TID PRN Lenard LanceAllen, Tina L, FNP      . magnesium hydroxide (MILK OF MAGNESIA) suspension 30 mL  30 mL Oral Daily PRN Lenard LanceAllen, Tina L, FNP      . sertraline (ZOLOFT) tablet 50 mg  50 mg Oral Daily Lenard LanceAllen, Tina L, FNP      . traZODone (DESYREL) tablet 50 mg  50 mg Oral QHS PRN Lenard LanceAllen, Tina L, FNP       Current Outpatient Medications  Medication Sig Dispense Refill  . albuterol (VENTOLIN HFA) 108 (90 Base) MCG/ACT inhaler Inhale 2 puffs into the lungs every 6 (six) hours as needed for wheezing or shortness of breath. 18 g 1  . Alum & Mag Hydroxide-Simeth (GI COCKTAIL) SUSP suspension Take 15-30 mLs by mouth every 2 (two) hours as needed. (Patient not taking: Reported on 01/20/2021)    . diclofenac (VOLTAREN) 75 MG EC tablet Take 1 tablet (75 mg total) by mouth 2 (two) times daily. 60 tablet 0  . metroNIDAZOLE (FLAGYL) 500 MG tablet Take 1 tablet (500 mg total) by mouth 2 (two) times daily. (Patient not taking: Reported on 01/20/2021) 14 tablet 0  . omeprazole (PRILOSEC) 20 MG capsule Take 1 capsule (20 mg total) by mouth daily. 14 capsule 0  . sucralfate (CARAFATE) 1 g tablet Take 1 tablet (1 g total) by mouth 4 (four) times daily -  with meals and at bedtime. 60 tablet 1  .  Vitamin D, Ergocalciferol, (DRISDOL) 1.25 MG (50000 UNIT) CAPS capsule Take 1 capsule (50,000 Units total) by  mouth every 7 (seven) days. 12 capsule 0    PTA Medications: (Not in a hospital admission)   Musculoskeletal  Strength & Muscle Tone: within normal limits Gait & Station: normal Patient leans: N/A  Psychiatric Specialty Exam  Presentation  General Appearance: Appropriate for Environment; Casual  Eye Contact:Good  Speech:Clear and Coherent; Normal Rate  Speech Volume:Normal  Handedness:Right   Mood and Affect  Mood:Euthymic  Affect:Appropriate; Congruent   Thought Process  Thought Processes:Coherent; Goal Directed; Linear  Descriptions of Associations:Intact  Orientation:Full (Time, Place and Person)  Thought Content:WDL  Diagnosis of Schizophrenia or Schizoaffective disorder in past: No    Hallucinations:Hallucinations: None  Ideas of Reference:None  Suicidal Thoughts:Suicidal Thoughts: No  Homicidal Thoughts:Homicidal Thoughts: No   Sensorium  Memory:Immediate Good; Recent Good; Remote Good  Judgment:Fair  Insight:Fair   Executive Functions  Concentration:Good  Attention Span:Good  Recall:Good  Fund of Knowledge:Good  Language:Good   Psychomotor Activity  Psychomotor Activity:Psychomotor Activity: Normal   Assets  Assets:Communication Skills; Desire for Improvement; Financial Resources/Insurance; Housing; Resilience; Social Support   Sleep  Sleep:Sleep: Fair   Nutritional Assessment (For OBS and FBC admissions only) Has the patient had a weight loss or gain of 10 pounds or more in the last 3 months?: No Has the patient had a decrease in food intake/or appetite?: No Does the patient have dental problems?: No Does the patient have eating habits or behaviors that may be indicators of an eating disorder including binging or inducing vomiting?: No Has the patient recently lost weight without trying?: No Has the patient been  eating poorly because of a decreased appetite?: No Malnutrition Screening Tool Score: 0    Physical Exam  Physical Exam Constitutional:      Appearance: Normal appearance. She is normal weight.  HENT:     Head: Normocephalic and atraumatic.  Pulmonary:     Effort: Pulmonary effort is normal.  Neurological:     Mental Status: She is alert.    Review of Systems  Constitutional: Negative for chills and fever.  Eyes: Negative for discharge and redness.  Respiratory: Negative for cough.   Cardiovascular: Negative for chest pain.  Gastrointestinal: Negative for abdominal pain.  Neurological: Negative for headaches.  Psychiatric/Behavioral: Positive for depression. Negative for suicidal ideas.   Blood pressure 120/64, pulse 70, temperature 97.7 F (36.5 C), temperature source Temporal, resp. rate 16, last menstrual period 01/09/2021, SpO2 99 %. There is no height or weight on file to calculate BMI.  Demographic Factors:  NA  Loss Factors: Decline in physical health and Financial problems/change in socioeconomic status  Historical Factors: Prior suicide attempts, Family history of suicide and Family history of mental illness or substance abuse  Risk Reduction Factors:   Responsible for children under 59 years of age, Sense of responsibility to family, Employed, Living with another person, especially a relative and Positive social support  Continued Clinical Symptoms:  Depression Previous Psychiatric Diagnoses and Treatments  Cognitive Features That Contribute To Risk:  None    Suicide Risk:  Minimal: No identifiable suicidal ideation.  Patients presenting with no risk factors but with morbid ruminations; may be classified as minimal risk based on the severity of the depressive symptoms  Plan Of Care/Follow-up recommendations:  Activity:  as tolerated Diet:  regular Other:        Disposition:   Patient is instructed prior to discharge to: Take all medications as  prescribed by his/her mental healthcare provider. Report any adverse effects and or reactions from the medicines  to his/her outpatient provider promptly. Patient has been instructed & cautioned: To not engage in alcohol and or illegal drug use while on prescription medicines. In the event of worsening symptoms, patient is instructed to call the crisis hotline, 911 and or go to the nearest ED for appropriate evaluation and treatment of symptoms. To follow-up with his/her primary care provider for your other medical issues, concerns and or health care needs.     Please come to [facility] (this facility) during walk in hours for appointment with psychiatrist for further medication management and for therapists for therapy.    Walk in hours are 8-11 AM Monday through Thursday for medication management.Child and adolescent psychiatrists are only available on Wednesdays and Thursdays during walk in hours.   Therapy walk in hours are Monday-Wednesday 8 AM-1PM and only 4 intakes are done a day.  It is first come, first -serve; it is best to arrive by 7:00 AM. On Friday from 1 pm to 4 pm for therapy intake only. Please arrive by 12:00 pm as it is  first come, first -serve.    When you arrive please go upstairs for your appointment. If you are unsure of where to go, inform the front desk that you are here for a walk in appointment and they will assist you with directions upstairs.  Address:  [address] in [place], [address] Ph: [phone]   Estella Husk, MD 01/23/2021, 10:43 AM

## 2021-01-26 ENCOUNTER — Telehealth: Payer: Self-pay

## 2021-01-26 ENCOUNTER — Other Ambulatory Visit: Payer: Self-pay

## 2021-01-26 DIAGNOSIS — R3129 Other microscopic hematuria: Secondary | ICD-10-CM

## 2021-01-26 LAB — MYCOPLASMA / UREAPLASMA CULTURE
Mycoplasma hominis Culture: NEGATIVE
Ureaplasma urealyticum: POSITIVE — AB

## 2021-01-26 MED ORDER — DOXYCYCLINE HYCLATE 100 MG PO CAPS: 100.0000 mg | ORAL_CAPSULE | Freq: Two times a day (BID) | ORAL | 0 refills | Status: AC

## 2021-01-26 NOTE — Telephone Encounter (Signed)
-----   Message from Carman Ching, [place] sent at 01/26/2021  9:44 AM EDT ----- Please let her know that her atypical culture came back positive for a less common form of UTI. I'd like to start her on doxycycline 100mg  BID x7 days with plans for repeat UA (lab visit) in 2 weeks. ----- Message ----- From: Lab Results In Sent: 01/20/2021   4:37 PM EDT To: 01/22/2021, PA-C

## 2021-01-26 NOTE — Telephone Encounter (Signed)
Notified patient as advised, sent ABX RX, made lab appt for 2 weeks. Patient voiced understanding.

## 2021-01-27 ENCOUNTER — Telehealth: Payer: Self-pay | Admitting: Internal Medicine

## 2021-01-27 DIAGNOSIS — E559 Vitamin D deficiency, unspecified: Secondary | ICD-10-CM

## 2021-01-27 NOTE — Telephone Encounter (Signed)
Pt called stating her kids threw her Vitamin D, Ergocalciferol, (DRISDOL) 1.25 MG (50000 UNIT) CAPS capsule away while you were in the hospital. Pt is asking if you could refill this medication again. Pharmacy  [facility] [phone] - [place], [place] - [address] [facility] [address]  [address] [place] [address]  Phone:  [phone] Fax:  [phone]  DEA #:  [id number]

## 2021-01-28 ENCOUNTER — Other Ambulatory Visit: Payer: Self-pay | Admitting: Internal Medicine

## 2021-01-28 DIAGNOSIS — E559 Vitamin D deficiency, unspecified: Secondary | ICD-10-CM

## 2021-01-28 MED ORDER — VITAMIN D (ERGOCALCIFEROL) 1.25 MG (50000 UNIT) PO CAPS: 50000.0000 [IU] | ORAL_CAPSULE | ORAL | 0 refills | Status: DC

## 2021-01-28 NOTE — Addendum Note (Signed)
Addended by: Guy Franco on: 01/28/2021 10:51 AM   Modules accepted: Orders

## 2021-01-28 NOTE — Telephone Encounter (Signed)
Will resend.  Unsure if insurance will cover. Patient will have to speak to pharmacy.

## 2021-01-30 ENCOUNTER — Other Ambulatory Visit: Payer: Self-pay

## 2021-01-30 DIAGNOSIS — E559 Vitamin D deficiency, unspecified: Secondary | ICD-10-CM

## 2021-01-30 NOTE — Telephone Encounter (Signed)
Pt stated the pharmacy did not receive the refill for her Vitamin D, Ergocalciferol, (DRISDOL) 1.25 MG (50000 UNIT) CAPS

## 2021-01-30 NOTE — Telephone Encounter (Signed)
Contacted [facility] and spoke to [place] and she confirmed they have rx and they will go ahead and fill it   Contacted pt to make aware pt didn't answer left a detailed vm making pt aware and if she has any questions or concerns to give a call

## 2021-02-04 ENCOUNTER — Other Ambulatory Visit: Payer: Self-pay

## 2021-02-04 DIAGNOSIS — R3129 Other microscopic hematuria: Secondary | ICD-10-CM

## 2021-02-10 ENCOUNTER — Other Ambulatory Visit: Payer: Medicaid Other

## 2021-02-10 ENCOUNTER — Other Ambulatory Visit: Payer: Self-pay

## 2021-02-10 ENCOUNTER — Ambulatory Visit
Admission: RE | Admit: 2021-02-10 | Discharge: 2021-02-10 | Disposition: A | Discharge status: Home / Self Care | Payer: Medicaid Other | Source: Ambulatory Visit | Attending: Physician Assistant | Admitting: Physician Assistant

## 2021-02-10 DIAGNOSIS — R109 Unspecified abdominal pain: Secondary | ICD-10-CM | POA: Diagnosis present

## 2021-02-10 DIAGNOSIS — R3129 Other microscopic hematuria: Secondary | ICD-10-CM

## 2021-02-13 ENCOUNTER — Telehealth: Payer: Self-pay | Admitting: Physician Assistant

## 2021-02-13 LAB — URINALYSIS, COMPLETE
Bilirubin, UA: NEGATIVE
Glucose, UA: NEGATIVE
Ketones, UA: NEGATIVE
Leukocytes,UA: NEGATIVE
Nitrite, UA: NEGATIVE
Protein,UA: NEGATIVE
Specific Gravity, UA: 1.02 (ref 1.005–1.030)
Urobilinogen, Ur: 0.2 mg/dL (ref 0.2–1.0)
pH, UA: 5.5 (ref 5.0–7.5)

## 2021-02-13 LAB — MICROSCOPIC EXAMINATION

## 2021-02-13 NOTE — Telephone Encounter (Signed)
Pt called office asking about test results.

## 2021-02-16 ENCOUNTER — Telehealth: Payer: Self-pay

## 2021-02-16 LAB — MYCOPLASMA / UREAPLASMA CULTURE
Mycoplasma hominis Culture: NEGATIVE
Ureaplasma urealyticum: NEGATIVE

## 2021-02-16 NOTE — Telephone Encounter (Signed)
-----   Message from Carman Ching, [place] sent at 02/16/2021  3:10 PM EDT ----- No significant findings on renal ultrasound to explain her pain.  I suspect her pain is musculoskeletal in origin and encourage her to follow-up with the pain clinic for management of this.  Additionally, I repeated testing of her urine and verify that she did clear the atypical urinary infection that I treated, however she does have some persistent microscopic hematuria.  I continue to recommend annual follow-up with repeat UA moving forward. ----- Message ----- From: Interface, Rad Results In Sent: 02/10/2021   1:02 PM EDT To: Carman Ching, PA-C

## 2021-02-16 NOTE — Telephone Encounter (Signed)
Notified patient as advised, patient expressed understanding. Confirmed patients yearly follow up.

## 2021-02-26 NOTE — Progress Notes (Deleted)
Office Visit Note  Patient: Theresa Mooney             Date of Birth: 08-26-1981           MRN: [id number]             PCP: Nicolette Bang, DO Referring: Caryl Never* Visit Date: 03/11/2021 Occupation: _0 @  Subjective:  No chief complaint on file.   History of Present Illness: [place]oney is a 40 y.o. female ***   Activities of Daily Living:  Patient reports morning stiffness for *** {minute/hour:19697}.   Patient {ACTIONS;DENIES/REPORTS:21021675::"Denies"} nocturnal pain.  Difficulty dressing/grooming: {ACTIONS;DENIES/REPORTS:21021675::"Denies"} Difficulty climbing stairs: {ACTIONS;DENIES/REPORTS:21021675::"Denies"} Difficulty getting out of chair: {ACTIONS;DENIES/REPORTS:21021675::"Denies"} Difficulty using hands for taps, buttons, cutlery, and/or writing: {ACTIONS;DENIES/REPORTS:21021675::"Denies"}  No Rheumatology ROS completed.   PMFS History:  Patient Active Problem List   Diagnosis Date Noted  . Migraine without status migrainosus, not intractable 06/23/2020  . h/o postpartum BTL  12/13/2011  . S/P ectopic pregnancy 12/13/2011    Past Medical History:  Diagnosis Date  . Asthma   . Cannabis dependence in remission ([place])   . Ectopic pregnancy   . Elevated glucose   . Herpes 2002  . Infection   . Migraine   . Mixed hyperlipidemia   . Nicotine dependence   . Trichimoniasis 12-2010  . Urinary calculi     Family History  Problem Relation Age of Onset  . Hypertension Mother   . Diabetes Maternal Grandmother   . Heart disease Maternal Grandmother   . Hypertension Maternal Grandmother   . Diabetes Maternal Grandfather   . Heart disease Maternal Grandfather   . Hypertension Maternal Grandfather   . Hyperlipidemia Daughter   . Breast cancer Maternal Aunt   . Anesthesia problems Neg Hx    Past Surgical History:  Procedure Laterality Date  . DILATION AND CURETTAGE OF UTERUS    . LAPAROSCOPY  11/27/2011   Procedure: LAPAROSCOPY  OPERATIVE;  Surgeon: Mora Bellman, MD;  Location: [facility];  Service: Gynecology;  Laterality: Bilateral;  . SALPINGECTOMY     Bilateral for twin ectopic preg.   . TUBAL LIGATION  12-05-2010   Social History   Social History Narrative  . Not on file   Immunization History  Administered Date(s) Administered  . PFIZER(Purple Top)SARS-COV-2 Vaccination 06/20/2020, 07/11/2020  . Tdap 11/06/2014     Objective: Vital Signs: There were no vitals taken for this visit.   Physical Exam   Musculoskeletal Exam: ***  CDAI Exam: CDAI Score: -- Patient Global: --; Provider Global: -- Swollen: --; Tender: -- Joint Exam 03/11/2021   No joint exam has been documented for this visit   There is currently no information documented on the homunculus. Go to the Rheumatology activity and complete the homunculus joint exam.  Investigation: No additional findings.  Imaging: US RENAL  Result Date: 02/10/2021 CLINICAL DATA:  Bilateral flank pain EXAM: RENAL / URINARY TRACT ULTRASOUND COMPLETE COMPARISON:  None. FINDINGS: Right Kidney: Renal measurements: 10.2 x 3.8 x 4.8 cm = volume: 78.5 mL. Echogenicity and renal cortical thickness are within normal limits. No mass, perinephric fluid, or hydronephrosis visualized. No sonographically demonstrable calculus or ureterectasis. Left Kidney: Renal measurements: 10.5 x 3.9 x 3.8 cm = volume: 81.6 mL. Echogenicity and renal cortical thickness are within normal limits. No mass, perinephric fluid, or hydronephrosis visualized. No sonographically demonstrable calculus or ureterectasis. Bladder: Appears normal for degree of bladder distention. Other: None. IMPRESSION: Study within normal limits. Electronically Signed   By:  Lowella Grip III M.D.   On: 02/10/2021 12:59    Recent Labs: Lab Results  Component Value Date   WBC 4.1 01/22/2021   HGB 13.6 01/22/2021   PLT 327 01/22/2021   NA 139 01/22/2021   K 3.8 01/22/2021   CL 103 01/22/2021   CO2 27  01/22/2021   GLUCOSE 94 01/22/2021   BUN 11 01/22/2021   CREATININE 0.97 01/22/2021   BILITOT 0.5 01/22/2021   ALKPHOS 97 01/22/2021   AST 13 (L) 01/22/2021   ALT 8 01/22/2021   PROT 7.9 01/22/2021   ALBUMIN 4.5 01/22/2021   CALCIUM 10.1 01/22/2021   GFRAA >60 06/07/2020    Speciality Comments: No specialty comments available.  Procedures:  No procedures performed Allergies: Hydrocodone and Penicillins   Assessment / Plan:     Visit Diagnoses: Elevated sed rate - 01/14/21: ESR 45, RF<10, vitamin D 21.3  Hx of migraines  S/P ectopic pregnancy  Vitamin D deficiency  Orders: No orders of the defined types were placed in this encounter.  No orders of the defined types were placed in this encounter.   Face-to-face time spent with patient was *** minutes. Greater than 50% of time was spent in counseling and coordination of care.  Follow-Up Instructions: No follow-ups on file.   Ofilia Neas, PA-C  Note - This record has been created using Dragon software.  Chart creation errors have been sought, but may not always  have been located. Such creation errors do not reflect on  the standard of medical care.

## 2021-03-09 ENCOUNTER — Other Ambulatory Visit: Payer: Self-pay

## 2021-03-09 DIAGNOSIS — S79922A Unspecified injury of left thigh, initial encounter: Secondary | ICD-10-CM | POA: Diagnosis present

## 2021-03-09 DIAGNOSIS — W228XXA Striking against or struck by other objects, initial encounter: Secondary | ICD-10-CM | POA: Insufficient documentation

## 2021-03-09 DIAGNOSIS — J45909 Unspecified asthma, uncomplicated: Secondary | ICD-10-CM | POA: Diagnosis not present

## 2021-03-09 DIAGNOSIS — S7012XA Contusion of left thigh, initial encounter: Secondary | ICD-10-CM | POA: Insufficient documentation

## 2021-03-09 DIAGNOSIS — R600 Localized edema: Secondary | ICD-10-CM | POA: Diagnosis not present

## 2021-03-10 ENCOUNTER — Emergency Department (HOSPITAL_BASED_OUTPATIENT_CLINIC_OR_DEPARTMENT_OTHER): Payer: Medicaid Other

## 2021-03-10 ENCOUNTER — Other Ambulatory Visit: Payer: Self-pay

## 2021-03-10 ENCOUNTER — Emergency Department (HOSPITAL_BASED_OUTPATIENT_CLINIC_OR_DEPARTMENT_OTHER)
Admission: EM | Admit: 2021-03-10 | Discharge: 2021-03-10 | Disposition: A | Discharge status: Home / Self Care | Payer: Medicaid Other | Attending: Emergency Medicine | Admitting: Emergency Medicine

## 2021-03-10 ENCOUNTER — Encounter (HOSPITAL_BASED_OUTPATIENT_CLINIC_OR_DEPARTMENT_OTHER): Payer: Self-pay | Admitting: *Deleted

## 2021-03-10 DIAGNOSIS — M79605 Pain in left leg: Secondary | ICD-10-CM

## 2021-03-10 DIAGNOSIS — S7012XA Contusion of left thigh, initial encounter: Secondary | ICD-10-CM

## 2021-03-10 NOTE — ED Notes (Signed)
Patient transported to US 

## 2021-03-10 NOTE — ED Triage Notes (Signed)
Pt says she hit her left thigh against a metal piece about  3 weeks ago and she had some bruising at that time, now she has a knot in that area.

## 2021-03-10 NOTE — ED Provider Notes (Signed)
[facility] EMERGENCY DEPT Provider Note   CSN: [id number] Arrival date & time: 03/09/21  2352     History No chief complaint on file.   Theresa Mooney is a 40 y.o. female.  HPI     This is a 40 year old female with a history of hyperlipidemia, asthma who presents with left leg pain.  Patient reports left greater than right leg pain.  She states at baseline she has bilateral leg pain when she has dietary indiscretion.  She has a appointment with rheumatologist on Wednesday because of this ongoing issue.  She states however she has had increased pain in the left leg.  She did injure her leg several weeks ago at work and had a significant bruise.  She has noted a knot in that leg.  She is unsure whether this is related.  She denies any numbness or tingling in the leg.  She states in general the pain is in the medial aspect of the leg and radiates downward.  She has not taken anything for her pain.  Currently she rates her pain at 4 out of 10.  Past Medical History:  Diagnosis Date   Asthma    Cannabis dependence in remission [facility])    Ectopic pregnancy    Elevated glucose    Herpes 2002   Infection    Migraine    Mixed hyperlipidemia    Nicotine dependence    Trichimoniasis 12-2010   Urinary calculi     Patient Active Problem List   Diagnosis Date Noted   Migraine without status migrainosus, not intractable 06/23/2020   h/o postpartum BTL  12/13/2011   S/P ectopic pregnancy 12/13/2011    Past Surgical History:  Procedure Laterality Date   DILATION AND CURETTAGE OF UTERUS     LAPAROSCOPY  11/27/2011   Procedure: LAPAROSCOPY OPERATIVE;  Surgeon: Catalina Antigua, MD;  Location: [facility];  Service: Gynecology;  Laterality: Bilateral;   SALPINGECTOMY     Bilateral for twin ectopic preg.    TUBAL LIGATION  12-05-2010     OB History     Gravida  7   Para  5   Term      Preterm      AB  2   Living  5      SAB      IAB  1   Ectopic  1   Multiple       Live Births  5           Family History  Problem Relation Age of Onset   Hypertension Mother    Diabetes Maternal Grandmother    Heart disease Maternal Grandmother    Hypertension Maternal Grandmother    Diabetes Maternal Grandfather    Heart disease Maternal Grandfather    Hypertension Maternal Grandfather    Hyperlipidemia Daughter    Breast cancer Maternal Aunt    Anesthesia problems Neg Hx     Social History   Tobacco Use   Smoking status: Never   Smokeless tobacco: Never  Vaping Use   Vaping Use: Never used  Substance Use Topics   Alcohol use: Yes    Alcohol/week: 1.0 standard drink    Types: 1 Glasses of wine per week    Comment: social   Drug use: Not Currently    Types: Marijuana    Comment: hasn't smoked in 2 months    Home Medications Prior to Admission medications   Medication Sig Start Date End Date Taking? Authorizing Provider  albuterol (VENTOLIN HFA) 108 (90 Base) MCG/ACT inhaler Inhale 2 puffs into the lungs every 6 (six) hours as needed for wheezing or shortness of breath. 11/06/20   Arvilla Market, DO  Alum & Mag Hydroxide-Simeth (GI COCKTAIL) SUSP suspension Take 15-30 mLs by mouth every 2 (two) hours as needed. Patient not taking: Reported on 01/20/2021 05/22/20   [provider]  diclofenac (VOLTAREN) 75 MG EC tablet Take 1 tablet (75 mg total) by mouth 2 (two) times daily. 01/14/21   Arvilla Market, DO  metroNIDAZOLE (FLAGYL) 500 MG tablet Take 1 tablet (500 mg total) by mouth 2 (two) times daily. Patient not taking: Reported on 01/20/2021 11/12/20   Arvilla Market, DO  omeprazole (PRILOSEC) 20 MG capsule Take 1 capsule (20 mg total) by mouth daily. 05/19/20   Robinson, [place] N, PA-C  sucralfate (CARAFATE) 1 g tablet Take 1 tablet (1 g total) by mouth 4 (four) times daily -  with meals and at bedtime. 05/07/20   Arvilla Market, DO  Vitamin D, Ergocalciferol, (DRISDOL) 1.25 MG (50000 UNIT) CAPS  capsule Take 1 capsule (50,000 Units total) by mouth every 7 (seven) days. 01/28/21   Arvilla Market, DO  SUMAtriptan (IMITREX) 50 MG tablet Take 1 tablet (50 mg total) by mouth every 2 (two) hours as needed for migraine. May repeat in 2 hours if headache persists or recurs. Patient not taking: Reported on 07/02/2020 06/23/20 07/29/20  Mayers, Cari S, PA-C    Allergies    Hydrocodone and Penicillins  Review of Systems   Review of Systems  Constitutional:  Negative for fever.  Respiratory:  Negative for shortness of breath.   Cardiovascular:  Negative for chest pain and leg swelling.  Gastrointestinal:  Negative for abdominal pain.  Musculoskeletal:        Leg pain  Neurological:  Negative for weakness and numbness.  All other systems reviewed and are negative.  Physical Exam Updated Vital Signs BP 114/71 (BP Location: Right Arm)   Pulse 69   Temp 98.5 F (36.9 C) (Oral)   Resp 18   Ht 1.524 m (5')   Wt 90.7 kg   LMP 02/15/2021   SpO2 100%   BMI 39.06 kg/m   Physical Exam Vitals and nursing note reviewed.  Constitutional:      Appearance: She is well-developed. She is obese. She is not ill-appearing.  HENT:     Head: Normocephalic and atraumatic.     Nose: Nose normal.     Mouth/Throat:     Mouth: Mucous membranes are moist.  Eyes:     Pupils: Pupils are equal, round, and reactive to light.  Cardiovascular:     Rate and Rhythm: Normal rate and regular rhythm.     Heart sounds: Normal heart sounds.  Pulmonary:     Effort: Pulmonary effort is normal. No respiratory distress.     Breath sounds: No wheezing.  Abdominal:     General: Bowel sounds are normal.     Palpations: Abdomen is soft.     Tenderness: There is no abdominal tenderness. There is no guarding or rebound.  Musculoskeletal:     Cervical back: Neck supple.     Comments: Slight bruising noted over the lateral aspect of the left thigh with a small subcutaneous nodule palpated, no fluctuance or  erythema, no significant asymmetric swelling bilaterally, no calf tenderness, neurovascular intact distally  Skin:    General: Skin is warm and dry.  Neurological:  Mental Status: She is alert and oriented to person, place, and time.  Psychiatric:        Mood and Affect: Mood normal.    ED Results / Procedures / Treatments   Labs (all labs ordered are listed, but only abnormal results are displayed) Labs Reviewed - No data to display  EKG None  Radiology US Venous Img Lower Unilateral Left  Result Date: 03/10/2021 CLINICAL DATA:  Left thigh and not and bruising since injury 3 weeks ago. Pain and edema. EXAM: Left LOWER EXTREMITY VENOUS DOPPLER ULTRASOUND TECHNIQUE: Gray-scale sonography with compression, as well as color and duplex ultrasound, were performed to evaluate the deep venous system(s) from the level of the common femoral vein through the popliteal and proximal calf veins. COMPARISON:  None. FINDINGS: VENOUS Normal compressibility of the common femoral, superficial femoral, and popliteal veins, as well as the visualized calf veins. Visualized portions of profunda femoral vein and great saphenous vein unremarkable. No filling defects to suggest DVT on grayscale or color Doppler imaging. Doppler waveforms show normal direction of venous flow, normal respiratory plasticity and response to augmentation. Limited views of the contralateral common femoral vein are unremarkable. OTHER None. Limitations: none IMPRESSION: No evidence of deep venous thrombosis in the visualized lower extremity. Electronically Signed   By: Burman Nieves M.D.   On: 03/10/2021 01:54    Procedures Procedures   Medications Ordered in ED Medications - No data to display  ED Course  I have reviewed the triage vital signs and the nursing notes.  Pertinent labs & imaging results that were available during my care of the patient were reviewed by me and considered in my medical decision making (see chart for  details).    MDM Rules/Calculators/A&P                          Patient presents with left leg pain.  She is overall nontoxic and vital signs are reassuring.  She is concerned both about a place where she sustained trauma as well as worsening pain but it is worse than normal and more asymmetric than normal.  She has evidence of prior contusion and a small subcutaneous knot on the left thigh.  This does not appear infected.  Likely traumatic related.  She has no calf tenderness but given her description of pain, DVT is certainly consideration.  Her body habitus limits exam.  Ultrasound was obtained and shows no evidence of DVT.  Patient reports she has follow-up with her primary physician.  Given that she has had pain similar to this in the past which she relates to dietary indiscretion, do not feel she needs further work-up at this time.  Patient reassured and recommended to follow-up as planned  After history, exam, and medical workup I feel the patient has been appropriately medically screened and is safe for discharge home. Pertinent diagnoses were discussed with the patient. Patient was given return precautions.  Final Clinical Impression(s) / ED Diagnoses Final diagnoses:  Pain of left lower extremity  Contusion of left thigh, initial encounter    Rx / DC Orders ED Discharge Orders     None        Cintya Daughety, Mayer Masker, MD 03/10/21 0202

## 2021-03-10 NOTE — Discharge Instructions (Addendum)
You were seen today for leg pain.  Your ultrasound is negative for blood clot.  Follow-up with your doctor as planned.  Take ibuprofen as needed for pain.

## 2021-03-11 ENCOUNTER — Ambulatory Visit: Payer: Medicaid Other | Admitting: Rheumatology

## 2021-03-11 DIAGNOSIS — Z8759 Personal history of other complications of pregnancy, childbirth and the puerperium: Secondary | ICD-10-CM

## 2021-03-11 DIAGNOSIS — Z8669 Personal history of other diseases of the nervous system and sense organs: Secondary | ICD-10-CM

## 2021-03-11 DIAGNOSIS — R700 Elevated erythrocyte sedimentation rate: Secondary | ICD-10-CM

## 2021-03-11 DIAGNOSIS — E559 Vitamin D deficiency, unspecified: Secondary | ICD-10-CM

## 2021-03-17 ENCOUNTER — Other Ambulatory Visit: Payer: Self-pay

## 2021-03-17 ENCOUNTER — Ambulatory Visit (INDEPENDENT_AMBULATORY_CARE_PROVIDER_SITE_OTHER): Payer: Medicaid Other | Admitting: Nurse Practitioner

## 2021-03-17 VITALS — BP 117/66 | HR 73 | Temp 97.6°F | Resp 18

## 2021-03-17 DIAGNOSIS — M79604 Pain in right leg: Secondary | ICD-10-CM

## 2021-03-17 DIAGNOSIS — M79605 Pain in left leg: Secondary | ICD-10-CM

## 2021-03-17 NOTE — Assessment & Plan Note (Signed)
Please keep follow up with PCP tomorrow  Please keep follow up with rheumatology  May try gluten free diet for inflammation  Hand out given on nutritional rehabilitation    Follow up:  With PCP and rheumatology

## 2021-03-17 NOTE — Patient Instructions (Addendum)
Bilateral leg pain:  Please keep follow up with PCP tomorrow  Please keep follow up with rheumatology  May try gluten free diet for inflammation  Hand out given on nutritional rehabilitation    Follow up:  With PCP and rheumatology

## 2021-03-17 NOTE — Progress Notes (Signed)
@Patient  ID: , female    DOB: Jun 20, 1981, 40 y.o.   MRN: [id number]  Chief Complaint  Patient presents with   Hospitalization Follow-up     Referring provider: [id number]*  HPI  Patient presents for transition of care visit.  She was seen in the ED on 03/09/2020 for leg pain.  She was negative for DVT.  She has had this issue for several weeks.  Is been going on after an injury at work.  Patient is scheduled for a follow-up visit with her PCP tomorrow.  She is also scheduled for an appointment with rheumatology this upcoming Wednesday.  Overall patient states that her pain is improving.  Her gait is steady.  She is not walking with a limp today.  She does not have any significant swelling today.  Patient states that she notices that the pain is worse in her legs when she eats certain foods.  She states that this may be due to systemic inflammation from certain foods.  Denies f/c/s, n/v/d, hemoptysis, PND, chest pain or edema.     Allergies  Allergen Reactions   Hydrocodone Nausea Only    Dizziness   Penicillins     Did it involve swelling of the face/tongue/throat, SOB, or low BP? Y Did it involve sudden or severe rash/hives, skin peeling, or any reaction on the inside of your mouth or nose? N Did you need to seek medical attention at a hospital or doctor's office? Y When did it last happen?  Over 1 month     If all above answers are "NO", may proceed with cephalosporin use.      Immunization History  Administered Date(s) Administered   PFIZER(Purple Top)SARS-COV-2 Vaccination 06/20/2020, 07/11/2020   Tdap 11/06/2014    Past Medical History:  Diagnosis Date   Asthma    Cannabis dependence in remission [facility])    Ectopic pregnancy    Elevated glucose    Herpes 2002   Infection    Migraine    Mixed hyperlipidemia    Nicotine dependence    Trichimoniasis 12-2010   Urinary calculi     Tobacco History: Social History   Tobacco Use   Smoking Status Never  Smokeless Tobacco Never   Counseling given: Not Answered   Outpatient Encounter Medications as of 03/17/2021  Medication Sig   albuterol (VENTOLIN HFA) 108 (90 Base) MCG/ACT inhaler Inhale 2 puffs into the lungs every 6 (six) hours as needed for wheezing or shortness of breath.   Alum & Mag Hydroxide-Simeth (GI COCKTAIL) SUSP suspension Take 15-30 mLs by mouth every 2 (two) hours as needed. (Patient not taking: Reported on 01/20/2021)   diclofenac (VOLTAREN) 75 MG EC tablet Take 1 tablet (75 mg total) by mouth 2 (two) times daily.   metroNIDAZOLE (FLAGYL) 500 MG tablet Take 1 tablet (500 mg total) by mouth 2 (two) times daily. (Patient not taking: Reported on 01/20/2021)   omeprazole (PRILOSEC) 20 MG capsule Take 1 capsule (20 mg total) by mouth daily.   sucralfate (CARAFATE) 1 g tablet Take 1 tablet (1 g total) by mouth 4 (four) times daily -  with meals and at bedtime.   Vitamin D, Ergocalciferol, (DRISDOL) 1.25 MG (50000 UNIT) CAPS capsule Take 1 capsule (50,000 Units total) by mouth every 7 (seven) days.   [DISCONTINUED] SUMAtriptan (IMITREX) 50 MG tablet Take 1 tablet (50 mg total) by mouth every 2 (two) hours as needed for migraine. May repeat in 2 hours if headache persists or  recurs. (Patient not taking: Reported on 07/02/2020)   No facility-administered encounter medications on file as of 03/17/2021.     Review of Systems  Review of Systems  Constitutional: Negative.  Negative for fatigue and fever.  HENT: Negative.    Respiratory:  Negative for cough and shortness of breath.   Cardiovascular: Negative.   Gastrointestinal: Negative.   Musculoskeletal:  Positive for myalgias.  Allergic/Immunologic: Negative.   Neurological: Negative.   Psychiatric/Behavioral: Negative.        Physical Exam  BP 117/66   Pulse 73   Temp 97.6 F (36.4 C)   Resp 18   LMP 02/15/2021   SpO2 99%   Wt Readings from Last 5 Encounters:  03/10/21 200 lb (90.7 kg)   01/22/21 202 lb (91.6 kg)  01/20/21 202 lb (91.6 kg)  01/14/21 203 lb (92.1 kg)  01/05/21 230 lb (104.3 kg)     Physical Exam Vitals and nursing note reviewed.  Constitutional:      General: She is not in acute distress.    Appearance: She is well-developed.  Cardiovascular:     Rate and Rhythm: Normal rate and regular rhythm.  Pulmonary:     Effort: Pulmonary effort is normal.     Breath sounds: Normal breath sounds.  Musculoskeletal:     Right lower leg: No swelling or deformity.     Left lower leg: No swelling or deformity.  Neurological:     Mental Status: She is alert and oriented to person, place, and time.     Lab Results:  CBC    Component Value Date/Time   WBC 4.1 01/22/2021 1315   RBC 5.00 01/22/2021 1315   HGB 13.6 01/22/2021 1315   HGB 13.1 10/22/2019 1134   HCT 44.1 01/22/2021 1315   HCT 41.8 10/22/2019 1134   PLT 327 01/22/2021 1315   PLT 317 10/22/2019 1134   MCV 88.2 01/22/2021 1315   MCV 91 10/22/2019 1134   MCH 27.2 01/22/2021 1315   MCHC 30.8 01/22/2021 1315   RDW 13.6 01/22/2021 1315   RDW 12.7 10/22/2019 1134   LYMPHSABS 2.0 01/05/2021 1713   MONOABS 0.6 01/05/2021 1713   EOSABS 0.0 01/05/2021 1713   BASOSABS 0.0 01/05/2021 1713    BMET    Component Value Date/Time   NA 139 01/22/2021 1315   NA 137 10/22/2019 1134   K 3.8 01/22/2021 1315   CL 103 01/22/2021 1315   CO2 27 01/22/2021 1315   GLUCOSE 94 01/22/2021 1315   BUN 11 01/22/2021 1315   BUN 13 10/22/2019 1134   CREATININE 0.97 01/22/2021 1315   CALCIUM 10.1 01/22/2021 1315   GFRNONAA >60 01/22/2021 1315   GFRAA >60 06/07/2020 1237    BNP No results found for: BNP  ProBNP No results found for: PROBNP  Imaging: US Venous Img Lower Unilateral Left  Result Date: 03/10/2021 CLINICAL DATA:  Left thigh and not and bruising since injury 3 weeks ago. Pain and edema. EXAM: Left LOWER EXTREMITY VENOUS DOPPLER ULTRASOUND TECHNIQUE: Gray-scale sonography with compression, as  well as color and duplex ultrasound, were performed to evaluate the deep venous system(s) from the level of the common femoral vein through the popliteal and proximal calf veins. COMPARISON:  None. FINDINGS: VENOUS Normal compressibility of the common femoral, superficial femoral, and popliteal veins, as well as the visualized calf veins. Visualized portions of profunda femoral vein and great saphenous vein unremarkable. No filling defects to suggest DVT on grayscale or color Doppler imaging. Doppler  waveforms show normal direction of venous flow, normal respiratory plasticity and response to augmentation. Limited views of the contralateral common femoral vein are unremarkable. OTHER None. Limitations: none IMPRESSION: No evidence of deep venous thrombosis in the visualized lower extremity. Electronically Signed   By: Burman Nieves M.D.   On: 03/10/2021 01:54     Assessment & Plan:   Pain in both lower extremities Please keep follow up with PCP tomorrow  Please keep follow up with rheumatology  May try gluten free diet for inflammation  Hand out given on nutritional rehabilitation    Follow up:  With PCP and rheumatology      Ivonne Andrew, NP 03/17/2021

## 2021-03-18 ENCOUNTER — Other Ambulatory Visit (HOSPITAL_COMMUNITY)
Admission: RE | Admit: 2021-03-18 | Discharge: 2021-03-18 | Disposition: A | Discharge status: Home / Self Care | Payer: Medicaid Other | Source: Ambulatory Visit | Attending: Family | Admitting: Family

## 2021-03-18 ENCOUNTER — Ambulatory Visit: Payer: Medicaid Other | Admitting: Family

## 2021-03-18 ENCOUNTER — Ambulatory Visit: Payer: Medicaid Other

## 2021-03-18 DIAGNOSIS — N898 Other specified noninflammatory disorders of vagina: Secondary | ICD-10-CM | POA: Diagnosis not present

## 2021-03-18 NOTE — Progress Notes (Signed)
Pt presents for nurse visit for vaginal discharge and odor, self swab completed

## 2021-03-19 ENCOUNTER — Other Ambulatory Visit: Payer: Self-pay | Admitting: Family

## 2021-03-19 DIAGNOSIS — B9689 Other specified bacterial agents as the cause of diseases classified elsewhere: Secondary | ICD-10-CM

## 2021-03-19 DIAGNOSIS — N760 Acute vaginitis: Secondary | ICD-10-CM | POA: Insufficient documentation

## 2021-03-19 LAB — CERVICOVAGINAL ANCILLARY ONLY
Bacterial Vaginitis (gardnerella): POSITIVE — AB
Candida Glabrata: NEGATIVE
Candida Vaginitis: NEGATIVE
Chlamydia: NEGATIVE
Comment: NEGATIVE
Comment: NEGATIVE
Comment: NEGATIVE
Comment: NEGATIVE
Comment: NEGATIVE
Comment: NORMAL
Neisseria Gonorrhea: NEGATIVE
Trichomonas: NEGATIVE

## 2021-03-19 MED ORDER — METRONIDAZOLE 500 MG PO TABS: 500.0000 mg | ORAL_TABLET | Freq: Two times a day (BID) | ORAL | 0 refills | Status: AC

## 2021-03-19 NOTE — Progress Notes (Signed)
Gonorrhea, Chlamydia, Trichomonas, and Yeast Infection negative.   Vaginal swab positive for Bacterial Vaginitis, an overgrowth of normal bacteria in the vagina due to changes in pH often related to semen, menstrual periods, or soaps. Prescribed Flagyl twice per day for 7 days. Do not drink alcohol while taking this medication.  

## 2021-04-06 ENCOUNTER — Ambulatory Visit: Payer: Medicaid Other | Admitting: Rheumatology

## 2021-04-14 NOTE — Progress Notes (Deleted)
Office Visit Note  Patient: Theresa Mooney             Date of Birth: Sep 05, 1981           MRN: [id number]             PCP: Arvilla Market, MD Referring: Leary Roca* Visit Date: 04/28/2021 Occupation: @GUAROCC @  Subjective:  No chief complaint on file.   History of Present Illness: Theresa Mooney is a 40 y.o. female ***   Activities of Daily Living:  Patient reports morning stiffness for *** {minute/hour:19697}.   Patient {ACTIONS;DENIES/REPORTS:21021675::"Denies"} nocturnal pain.  Difficulty dressing/grooming: {ACTIONS;DENIES/REPORTS:21021675::"Denies"} Difficulty climbing stairs: {ACTIONS;DENIES/REPORTS:21021675::"Denies"} Difficulty getting out of chair: {ACTIONS;DENIES/REPORTS:21021675::"Denies"} Difficulty using hands for taps, buttons, cutlery, and/or writing: {ACTIONS;DENIES/REPORTS:21021675::"Denies"}  No Rheumatology ROS completed.   PMFS History:  Patient Active Problem List   Diagnosis Date Noted   Bacterial vaginitis 03/19/2021   Pain in both lower extremities 03/17/2021   Migraine without status migrainosus, not intractable 06/23/2020   h/o postpartum BTL  12/13/2011   S/P ectopic pregnancy 12/13/2011    Past Medical History:  Diagnosis Date   Asthma    Cannabis dependence in remission [facility])    Ectopic pregnancy    Elevated glucose    Herpes 2002   Infection    Migraine    Mixed hyperlipidemia    Nicotine dependence    Trichimoniasis 12-2010   Urinary calculi     Family History  Problem Relation Age of Onset   Hypertension Mother    Diabetes Maternal Grandmother    Heart disease Maternal Grandmother    Hypertension Maternal Grandmother    Diabetes Maternal Grandfather    Heart disease Maternal Grandfather    Hypertension Maternal Grandfather    Hyperlipidemia Daughter    Breast cancer Maternal Aunt    Anesthesia problems Neg Hx    Past Surgical History:  Procedure Laterality Date   DILATION AND CURETTAGE OF UTERUS      LAPAROSCOPY  11/27/2011   Procedure: LAPAROSCOPY OPERATIVE;  Surgeon: 01/27/2012, MD;  Location: [facility];  Service: Gynecology;  Laterality: Bilateral;   SALPINGECTOMY     Bilateral for twin ectopic preg.    TUBAL LIGATION  12-05-2010   Social History   Social History Narrative   Not on file   Immunization History  Administered Date(s) Administered   PFIZER(Purple Top)SARS-COV-2 Vaccination 06/20/2020, 07/11/2020   Tdap 11/06/2014     Objective: Vital Signs: There were no vitals taken for this visit.   Physical Exam   Musculoskeletal Exam: ***  CDAI Exam: CDAI Score: -- Patient Global: --; Provider Global: -- Swollen: --; Tender: -- Joint Exam 04/28/2021   No joint exam has been documented for this visit   There is currently no information documented on the homunculus. Go to the Rheumatology activity and complete the homunculus joint exam.  Investigation: No additional findings.  Imaging: No results found.  Recent Labs: Lab Results  Component Value Date   WBC 4.1 01/22/2021   HGB 13.6 01/22/2021   PLT 327 01/22/2021   NA 139 01/22/2021   K 3.8 01/22/2021   CL 103 01/22/2021   CO2 27 01/22/2021   GLUCOSE 94 01/22/2021   BUN 11 01/22/2021   CREATININE 0.97 01/22/2021   BILITOT 0.5 01/22/2021   ALKPHOS 97 01/22/2021   AST 13 (L) 01/22/2021   ALT 8 01/22/2021   PROT 7.9 01/22/2021   ALBUMIN 4.5 01/22/2021   CALCIUM 10.1 01/22/2021   GFRAA >60  06/07/2020    Speciality Comments: No specialty comments available.  Procedures:  No procedures performed Allergies: Hydrocodone and Penicillins   Assessment / Plan:     Visit Diagnoses: No diagnosis found.  Orders: No orders of the defined types were placed in this encounter.  No orders of the defined types were placed in this encounter.   Face-to-face time spent with patient was *** minutes. Greater than 50% of time was spent in counseling and coordination of care.  Follow-Up Instructions: No  follow-ups on file.   Gearldine Bienenstock, PA-C  Note - This record has been created using Dragon software.  Chart creation errors have been sought, but may not always  have been located. Such creation errors do not reflect on  the standard of medical care.

## 2021-04-15 ENCOUNTER — Ambulatory Visit (INDEPENDENT_AMBULATORY_CARE_PROVIDER_SITE_OTHER): Payer: Medicaid Other | Admitting: Clinical

## 2021-04-15 ENCOUNTER — Other Ambulatory Visit: Payer: Self-pay

## 2021-04-15 DIAGNOSIS — F331 Major depressive disorder, recurrent, moderate: Secondary | ICD-10-CM | POA: Diagnosis not present

## 2021-04-15 NOTE — Progress Notes (Signed)
   THERAPIST PROGRESS NOTE  Session Time: 40 minutes  Participation Level: Active  Behavioral Response: CasualAlertDepressed  Type of Therapy: Individual Therapy  Treatment Goals addressed: Coping  Interventions: CBT and Supportive  Summary:  Theresa Mooney is a 40 y.o. female who presents to the [facility] behavioral outpatient center as a walk-in for therapy services. Client is referred by the [facility] behavioral urgent care for follow-up services. Client was initially assessed at the [facility] on 01/22/2021 for worsening depression and anxiety symptoms. Client reported she has a history of depression since childhood following her dad's suicide which she did while she and her siblings were in the home. Client reported she also has a history of trauma in her past relationships with her children's father which she has stated she would like to process and cope with.  Client reported within the past 6 months her depression symptoms have worsened due to finances, family conflict, and occupational stressors. Client reported in the last month she has lost her job at [employer] because they stopped excepting her doctors and notes as an excuse from work. Client reported she had been there for 6 months.  Client reported the loss of her job has put a financial strain on her saving money and staying on top of paying her bills.  Client reported she also has a 15 year old son at home who she is worried about due to his problems with anger and substance abuse. Client reported her 59 year old son and her fianc have difficulty getting along which also causes her stress.  Client reported he is working on getting enrolled back in school at [employer] to finish her degree.  Client reported she dropped out previously due to her mental health affecting her studies.  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  Client reported her support includes her mother who lives in town  close back.  Client reported symptoms of not feeling good enough, feeling like a failure, crying spells, feeling overwhelmed, depressed mood.  Client reported she does have a job interview on today at [employer].  Flowsheet Row Counselor from 04/15/2021 in [place]  PHQ-9 Total Score 18          Suicidal/Homicidal: Nowithout intent/plan  Therapist Response:  Therapist began the session by making introductions and discussing confidentiality. Therapist used CBT and engage with the client to discuss the previous assessment gathered at time of presentation to the urgent care center. Therapist used CBT to engage with client and ask open-ended questions about her mental health symptoms since she was assessed previously. Therapist used CBT to ask the client to identify current stressors that are impacting her symptoms. Therapist used CBT to engage with the client and discussed problem solving skills utilizing prioritizing necessity of things that need to get done to decrease feelings of being overwhelmed. Therapist completed SDOH with the clients input. Therapist screened the client for pain, nutrition, and [place] suicide severity. Therapist addressed questions and concerns.   Plan: Return again in 6 weeks for individual therapy.  Client declined psychiatry services at this time.  Diagnosis: Major depressive disorder, recurrent episode, moderate    Neena Rhymes Johncarlos Holtsclaw, LCSW 04/15/2021

## 2021-04-28 ENCOUNTER — Ambulatory Visit: Payer: Medicaid Other | Admitting: Rheumatology

## 2021-04-28 DIAGNOSIS — M79605 Pain in left leg: Secondary | ICD-10-CM

## 2021-04-28 DIAGNOSIS — B9689 Other specified bacterial agents as the cause of diseases classified elsewhere: Secondary | ICD-10-CM

## 2021-04-28 DIAGNOSIS — Z8759 Personal history of other complications of pregnancy, childbirth and the puerperium: Secondary | ICD-10-CM

## 2021-04-28 DIAGNOSIS — R700 Elevated erythrocyte sedimentation rate: Secondary | ICD-10-CM

## 2021-04-28 DIAGNOSIS — Z8669 Personal history of other diseases of the nervous system and sense organs: Secondary | ICD-10-CM

## 2021-05-26 ENCOUNTER — Ambulatory Visit: Payer: Medicaid Other | Admitting: Rheumatology

## 2021-05-28 ENCOUNTER — Ambulatory Visit (HOSPITAL_COMMUNITY): Payer: Medicaid Other | Admitting: Licensed Clinical Social Worker

## 2021-06-22 NOTE — Progress Notes (Signed)
Office Visit Note  Patient: Theresa Mooney             Date of Birth: 1981-06-11           MRN: [id number]             PCP: Nicolette Bang, MD Referring: Caryl Never* Visit Date: 07/06/2021 Occupation: _0 @  Subjective:  No chief complaint on file.   History of Present Illness: Theresa Mooney is a 40 y.o. female in consultation per request of Dr. Juleen China.  According to the patient her symptoms started in 2019 with pain and her arms and legs.  She states she was seen by her PCP at the time her labs were normal.  She switched her care to Dr. Molli Barrows who started her on diclofenac and advised her dietary modifications.  She states she did much better after doing dietary modifications.  She had intentional weight loss and was almost pain-free.  She states she underwent a lot of stress recently and started eating unhealthy diet.  She also gained weight.  She had seen Dr. Juleen China in the last 1 year and had several telemetry visits.  She states she tried diclofenac but it causes reflux.  She is to take some natural anti-inflammatories which also caused reflux issues.  She describes pain in all of her muscles.  She states the pain gets worse when there is colder weather.  She denies any history of joint pain or joint swelling.  She denies any insomnia.  There is no family history of autoimmune disease.  She is gravida 5, para 5, miscarriages 0 and abortion 1.  She is s/p tubal ligation.  She also gives history of migraines  Activities of Daily Living:  Patient reports morning stiffness for all day.   Patient Reports nocturnal pain.  Difficulty dressing/grooming: Denies Difficulty climbing stairs: Denies Difficulty getting out of chair: Denies Difficulty using hands for taps, buttons, cutlery, and/or writing: Reports  Review of Systems  Constitutional:  Negative for fatigue.  HENT:  Negative for mouth sores, mouth dryness and nose dryness.   Eyes:  Positive for  pain and itching. Negative for dryness.  Respiratory:  Negative for shortness of breath and difficulty breathing.   Cardiovascular:  Negative for chest pain and palpitations.  Gastrointestinal:  Negative for blood in stool, constipation and diarrhea.  Endocrine: Negative for increased urination.  Genitourinary:  Negative for difficulty urinating.  Musculoskeletal:  Positive for myalgias, muscle tenderness and myalgias. Negative for joint pain, joint pain, joint swelling and morning stiffness.  Skin:  Negative for color change, rash, redness and sensitivity to sunlight.  Allergic/Immunologic: Negative for susceptible to infections.  Neurological:  Positive for numbness and headaches. Negative for memory loss and weakness.  Hematological:  Positive for bruising/bleeding tendency. Negative for swollen glands.  Psychiatric/Behavioral:  Negative for depressed mood, confusion and sleep disturbance. The patient is not nervous/anxious.    PMFS History:  Patient Active Problem List   Diagnosis Date Noted   Bacterial vaginitis 03/19/2021   Pain in both lower extremities 03/17/2021   Migraine without status migrainosus, not intractable 06/23/2020   h/o postpartum BTL  12/13/2011   S/P ectopic pregnancy 12/13/2011    Past Medical History:  Diagnosis Date   Asthma    Cannabis dependence in remission [facility])    Ectopic pregnancy    Elevated glucose    Herpes 2002   Infection    Migraine    Mixed hyperlipidemia  Nicotine dependence    Trichimoniasis 12-2010   Urinary calculi     Family History  Problem Relation Age of Onset   Hypertension Mother    Asthma Mother    Hypertension Sister    Stroke Sister    Breast cancer Maternal Aunt    Diabetes Maternal Grandmother    Heart disease Maternal Grandmother    Hypertension Maternal Grandmother    Diabetes Maternal Grandfather    Heart disease Maternal Grandfather    Hypertension Maternal Grandfather    Hyperlipidemia Daughter    Healthy  Son    Healthy Son    Healthy Son    Anesthesia problems Neg Hx    Past Surgical History:  Procedure Laterality Date   DILATION AND CURETTAGE OF UTERUS     LAPAROSCOPY  11/27/2011   Procedure: LAPAROSCOPY OPERATIVE;  Surgeon: Mora Bellman, MD;  Location: [facility];  Service: Gynecology;  Laterality: Bilateral;   SALPINGECTOMY     Bilateral for twin ectopic preg.    TUBAL LIGATION  12-05-2010   Social History   Social History Narrative   Not on file   Immunization History  Administered Date(s) Administered   PFIZER(Purple Top)SARS-COV-2 Vaccination 06/20/2020, 07/11/2020   Tdap 11/06/2014     Objective: Vital Signs: BP 122/87 (BP Location: Left Arm, Patient Position: Sitting, Cuff Size: Large)   Pulse 78   Ht 5' (1.524 m)   Wt 212 lb 12.8 oz (96.5 kg)   BMI 41.56 kg/m    Physical Exam Vitals and nursing note reviewed.  Constitutional:      Appearance: She is well-developed.  HENT:     Head: Normocephalic and atraumatic.  Eyes:     Conjunctiva/sclera: Conjunctivae normal.  Cardiovascular:     Rate and Rhythm: Normal rate and regular rhythm.     Heart sounds: Normal heart sounds.  Pulmonary:     Effort: Pulmonary effort is normal.     Breath sounds: Normal breath sounds.  Abdominal:     General: Bowel sounds are normal.     Palpations: Abdomen is soft.  Musculoskeletal:     Cervical back: Normal range of motion.  Lymphadenopathy:     Cervical: No cervical adenopathy.  Skin:    General: Skin is warm and dry.     Capillary Refill: Capillary refill takes less than 2 seconds.  Neurological:     Mental Status: She is alert and oriented to person, place, and time.  Psychiatric:        Behavior: Behavior normal.     Musculoskeletal Exam: C-spine thoracic and lumbar spine with good range of motion.  Shoulder joints, elbow joints, wrist joints, MCPs PIPs and DIPs with good range of motion with no synovitis.  Hip joints, knee joints, ankles, MTPs and PIPs with good  range of motion with no synovitis.  She had no muscular weakness or tenderness.  She had no tender points or hyperalgesia.  CDAI Exam: CDAI Score: -- Patient Global: --; Provider Global: -- Swollen: --; Tender: -- Joint Exam 07/06/2021   No joint exam has been documented for this visit   There is currently no information documented on the homunculus. Go to the Rheumatology activity and complete the homunculus joint exam.  Investigation: No additional findings.  Imaging: No results found.  Recent Labs: Lab Results  Component Value Date   WBC 4.1 01/22/2021   HGB 13.6 01/22/2021   PLT 327 01/22/2021   NA 139 01/22/2021   K 3.8 01/22/2021   CL  103 01/22/2021   CO2 27 01/22/2021   GLUCOSE 94 01/22/2021   BUN 11 01/22/2021   CREATININE 0.97 01/22/2021   BILITOT 0.5 01/22/2021   ALKPHOS 97 01/22/2021   AST 13 (L) 01/22/2021   ALT 8 01/22/2021   PROT 7.9 01/22/2021   ALBUMIN 4.5 01/22/2021   CALCIUM 10.1 01/22/2021   GFRAA >60 06/07/2020   November 2021 ANA negative, RF negative, Speciality Comments: No specialty comments available.  Procedures:  No procedures performed Allergies: Hydrocodone and Penicillins   Assessment / Plan:     Visit Diagnoses: Elevated sed rate - 01/14/21: ESR 45, RF<10 - Plan: Sedimentation rate.  Patient sed rate has been elevated for many years.  The sed rate has been fluctuating.  She had no joint inflammation on my examination.  She denies any history of joint pain or joint swelling.  Myalgia -she gets history of generalized muscle pain for the last few years.  She had no muscular weakness or tenderness on my examination.  She had no tender points.  Plan: CK, TSH  Other fatigue -she gives history of fatigue.  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  I will obtain following labs today.  Plan: Serum protein electrophoresis with reflex, CBC with Differential/Platelet, COMPLETE METABOLIC PANEL WITH GFR  NSAID long-term  use-she has been on diclofenac 75 mg tablet for the last few years.  She states she cannot take it continuously due to reflux issues.  She states NSAID has been helpful.  History of gastroesophageal reflux-she has severe reflux.  She has been using Prilosec and Carafate.  She is unable to use natural anti-inflammatories due to reflux issues.  Vitamin D deficiency -she has history of vitamin D deficiency.  Her vitamin D was low at 12.8 which normalized with the supplement use.  Vitamin D deficiency can also cause generalized myalgias.  We will check vitamin D level today.  She may need long-term vitamin D supplement.  Plan: VITAMIN D 25 Hydroxy (Vit-D Deficiency, Fractures)  Hx of migraines  S/P ectopic pregnancy  BMI 40.0-44.9, adult (HCC)-patient states that her myalgias and elevated sedimentation rate correlate with the weight gain.  She did much better in the past after weight loss.  She recently gained weight due to stress.  She requested information regarding weight loss.  I gave her information on weight management clinic.  Orders: Orders Placed This Encounter  Procedures   CK   TSH   Sedimentation rate   VITAMIN D 25 Hydroxy (Vit-D Deficiency, Fractures)   Serum protein electrophoresis with reflex   CBC with Differential/Platelet   COMPLETE METABOLIC PANEL WITH GFR    No orders of the defined types were placed in this encounter.    Follow-Up Instructions: Return for myalgia.   Bo Merino, MD  Note - This record has been created using Editor, commissioning.  Chart creation errors have been sought, but may not always  have been located. Such creation errors do not reflect on  the standard of medical care.

## 2021-06-23 ENCOUNTER — Ambulatory Visit: Payer: Medicaid Other | Admitting: Rheumatology

## 2021-07-06 ENCOUNTER — Encounter: Payer: Self-pay | Admitting: Rheumatology

## 2021-07-06 ENCOUNTER — Ambulatory Visit: Payer: Medicaid Other | Admitting: Rheumatology

## 2021-07-06 ENCOUNTER — Other Ambulatory Visit: Payer: Self-pay

## 2021-07-06 VITALS — BP 122/87 | HR 78 | Ht 60.0 in | Wt 212.8 lb

## 2021-07-06 DIAGNOSIS — Z8719 Personal history of other diseases of the digestive system: Secondary | ICD-10-CM

## 2021-07-06 DIAGNOSIS — Z791 Long term (current) use of non-steroidal anti-inflammatories (NSAID): Secondary | ICD-10-CM

## 2021-07-06 DIAGNOSIS — M791 Myalgia, unspecified site: Secondary | ICD-10-CM | POA: Diagnosis not present

## 2021-07-06 DIAGNOSIS — M79604 Pain in right leg: Secondary | ICD-10-CM

## 2021-07-06 DIAGNOSIS — E559 Vitamin D deficiency, unspecified: Secondary | ICD-10-CM

## 2021-07-06 DIAGNOSIS — R700 Elevated erythrocyte sedimentation rate: Secondary | ICD-10-CM | POA: Diagnosis not present

## 2021-07-06 DIAGNOSIS — R5383 Other fatigue: Secondary | ICD-10-CM

## 2021-07-06 DIAGNOSIS — Z8759 Personal history of other complications of pregnancy, childbirth and the puerperium: Secondary | ICD-10-CM

## 2021-07-06 DIAGNOSIS — Z6841 Body Mass Index (BMI) 40.0 and over, adult: Secondary | ICD-10-CM

## 2021-07-06 DIAGNOSIS — Z8669 Personal history of other diseases of the nervous system and sense organs: Secondary | ICD-10-CM

## 2021-07-07 NOTE — Progress Notes (Signed)
Anemia noted.  Which could be the cause of fatigue.  Sedimentation rate is slightly elevated which could be due to low hemoglobin.  Vitamin D is normal CMP is normal TSH is normal CK is normal.  Please advise patient to schedule an appointment with Dr. Earlene Plater for the evaluation of anemia.

## 2021-07-08 ENCOUNTER — Telehealth: Payer: Self-pay | Admitting: *Deleted

## 2021-07-08 LAB — COMPLETE METABOLIC PANEL WITH GFR
AG Ratio: 1.3 (calc) (ref 1.0–2.5)
ALT: 9 U/L (ref 6–29)
AST: 13 U/L (ref 10–30)
Albumin: 4 g/dL (ref 3.6–5.1)
Alkaline phosphatase (APISO): 81 U/L (ref 31–125)
BUN: 9 mg/dL (ref 7–25)
CO2: 29 mmol/L (ref 20–32)
Calcium: 8.7 mg/dL (ref 8.6–10.2)
Chloride: 105 mmol/L (ref 98–110)
Creat: 0.8 mg/dL (ref 0.50–0.97)
Globulin: 3 g/dL (calc) (ref 1.9–3.7)
Glucose, Bld: 82 mg/dL (ref 65–99)
Potassium: 4.1 mmol/L (ref 3.5–5.3)
Sodium: 139 mmol/L (ref 135–146)
Total Bilirubin: 0.4 mg/dL (ref 0.2–1.2)
Total Protein: 7 g/dL (ref 6.1–8.1)
eGFR: 97 mL/min/{1.73_m2} (ref >=60)

## 2021-07-08 LAB — CBC WITH DIFFERENTIAL/PLATELET
Absolute Monocytes: 409 cells/uL (ref 200–950)
Basophils Absolute: 32 cells/uL (ref 0–200)
Basophils Relative: 0.7 %
Eosinophils Absolute: 32 cells/uL (ref 15–500)
Eosinophils Relative: 0.7 %
HCT: 35.3 % (ref 35.0–45.0)
Hemoglobin: 10.9 g/dL — ABNORMAL LOW (ref 11.7–15.5)
Lymphs Abs: 2185 cells/uL (ref 850–3900)
MCH: 27.1 pg (ref 27.0–33.0)
MCHC: 30.9 g/dL — ABNORMAL LOW (ref 32.0–36.0)
MCV: 87.8 fL (ref 80.0–100.0)
MPV: 12 fL (ref 7.5–12.5)
Monocytes Relative: 8.9 %
Neutro Abs: 1941 cells/uL (ref 1500–7800)
Neutrophils Relative %: 42.2 %
Platelets: 294 10*3/uL (ref 140–400)
RBC: 4.02 10*6/uL (ref 3.80–5.10)
RDW: 12.8 % (ref 11.0–15.0)
Total Lymphocyte: 47.5 %
WBC: 4.6 10*3/uL (ref 3.8–10.8)

## 2021-07-08 LAB — PROTEIN ELECTROPHORESIS, SERUM, WITH REFLEX
Albumin ELP: 3.6 g/dL — ABNORMAL LOW (ref 3.8–4.8)
Alpha 1: 0.3 g/dL (ref 0.2–0.3)
Alpha 2: 0.7 g/dL (ref 0.5–0.9)
Beta 2: 0.4 g/dL (ref 0.2–0.5)
Beta Globulin: 0.5 g/dL (ref 0.4–0.6)
Gamma Globulin: 1.2 g/dL (ref 0.8–1.7)
Total Protein: 6.7 g/dL (ref 6.1–8.1)

## 2021-07-08 LAB — VITAMIN D 25 HYDROXY (VIT D DEFICIENCY, FRACTURES): Vit D, 25-Hydroxy: 42 ng/mL (ref 30–100)

## 2021-07-08 LAB — CK: Total CK: 84 U/L (ref 29–143)

## 2021-07-08 LAB — TSH: TSH: 4.4 mIU/L

## 2021-07-08 LAB — SEDIMENTATION RATE: Sed Rate: 33 mm/h — ABNORMAL HIGH (ref 0–20)

## 2021-07-08 NOTE — Telephone Encounter (Signed)
Patient has questions about her SPEP. Please review SPEP and advise.

## 2021-07-08 NOTE — Telephone Encounter (Signed)
Patient advised we look at the interpretation for abnormal proteins.  The interpretation states that there were no abnormal proteins. Patient expressed understanding.

## 2021-07-08 NOTE — Telephone Encounter (Signed)
We will look at the interpretation for abnormal proteins.  The interpretation states that there were no abnormal proteins.

## 2021-07-15 ENCOUNTER — Other Ambulatory Visit: Payer: Self-pay

## 2021-07-15 ENCOUNTER — Ambulatory Visit
Admission: EM | Admit: 2021-07-15 | Discharge: 2021-07-15 | Disposition: A | Discharge status: Home / Self Care | Payer: Medicaid Other | Attending: Physician Assistant | Admitting: Physician Assistant

## 2021-07-15 DIAGNOSIS — Z113 Encounter for screening for infections with a predominantly sexual mode of transmission: Secondary | ICD-10-CM | POA: Diagnosis not present

## 2021-07-15 LAB — POCT URINALYSIS DIP (MANUAL ENTRY)
Bilirubin, UA: NEGATIVE
Glucose, UA: NEGATIVE mg/dL
Ketones, POC UA: NEGATIVE mg/dL
Leukocytes, UA: NEGATIVE
Nitrite, UA: NEGATIVE
Protein Ur, POC: NEGATIVE mg/dL
Spec Grav, UA: >=1.03 — AB (ref 1.010–1.025)
Urobilinogen, UA: 0.2 E.U./dL
pH, UA: 5.5 (ref 5.0–8.0)

## 2021-07-15 NOTE — ED Triage Notes (Signed)
Pt c/o lower abdominal pain, thick white vaginal discharge with odor, and urinary frequency x2 days. Requesting std testing.

## 2021-07-15 NOTE — ED Provider Notes (Signed)
[name]    CSN: [id number] Arrival date & time: 07/15/21  1927      History   Chief Complaint Chief Complaint  Patient presents with   SEXUALLY TRANSMITTED DISEASE    HPI Theresa GILLOTT is a 40 y.o. female.   Patient here today for evaluation of possible STD. She has had some vaginal discharge as well as urinary frequency the last few days. She also endorses some abdominal pain. She has not had fever.   The history is provided by the patient.   Past Medical History:  Diagnosis Date   Asthma    Cannabis dependence in remission [facility])    Ectopic pregnancy    Elevated glucose    Herpes 2002   Infection    Migraine    Mixed hyperlipidemia    Nicotine dependence    Trichimoniasis 12-2010   Urinary calculi     Patient Active Problem List   Diagnosis Date Noted   Bacterial vaginitis 03/19/2021   Pain in both lower extremities 03/17/2021   Migraine without status migrainosus, not intractable 06/23/2020   h/o postpartum BTL  12/13/2011   S/P ectopic pregnancy 12/13/2011    Past Surgical History:  Procedure Laterality Date   DILATION AND CURETTAGE OF UTERUS     LAPAROSCOPY  11/27/2011   Procedure: LAPAROSCOPY OPERATIVE;  Surgeon: Catalina Antigua, MD;  Location: [facility];  Service: Gynecology;  Laterality: Bilateral;   SALPINGECTOMY     Bilateral for twin ectopic preg.    TUBAL LIGATION  12-05-2010    OB History     Gravida  7   Para  5   Term      Preterm      AB  2   Living  5      SAB      IAB  1   Ectopic  1   Multiple      Live Births  5            Home Medications    Prior to Admission medications   Medication Sig Start Date End Date Taking? Authorizing Provider  albuterol (VENTOLIN HFA) 108 (90 Base) MCG/ACT inhaler Inhale 2 puffs into the lungs every 6 (six) hours as needed for wheezing or shortness of breath. 11/06/20   Arvilla Market, MD  omeprazole (PRILOSEC) 20 MG capsule Take 1 capsule (20 mg total)  by mouth daily. 05/19/20   Robinson, [place] N, PA-C  Vitamin D, Ergocalciferol, (DRISDOL) 1.25 MG (50000 UNIT) CAPS capsule Take 1 capsule (50,000 Units total) by mouth every 7 (seven) days. 01/28/21   Arvilla Market, MD  SUMAtriptan (IMITREX) 50 MG tablet Take 1 tablet (50 mg total) by mouth every 2 (two) hours as needed for migraine. May repeat in 2 hours if headache persists or recurs. Patient not taking: Reported on 07/02/2020 06/23/20 07/29/20  Mayers, Kasandra Knudsen, PA-C    Family History Family History  Problem Relation Age of Onset   Hypertension Mother    Asthma Mother    Hypertension Sister    Stroke Sister    Breast cancer Maternal Aunt    Diabetes Maternal Grandmother    Heart disease Maternal Grandmother    Hypertension Maternal Grandmother    Diabetes Maternal Grandfather    Heart disease Maternal Grandfather    Hypertension Maternal Grandfather    Hyperlipidemia Daughter    Healthy Son    Healthy Son    Healthy Son    Anesthesia problems Neg  Hx     Social History Social History   Tobacco Use   Smoking status: Former    Types: Cigarettes   Smokeless tobacco: Never  Vaping Use   Vaping Use: Never used  Substance Use Topics   Alcohol use: Yes    Alcohol/week: 1.0 standard drink    Types: 1 Glasses of wine per week    Comment: social   Drug use: Not Currently    Types: Marijuana     Allergies   Hydrocodone and Penicillins   Review of Systems Review of Systems  Constitutional:  Negative for chills and fever.  Eyes:  Negative for discharge and redness.  Respiratory:  Negative for shortness of breath.   Gastrointestinal:  Positive for abdominal pain. Negative for nausea and vomiting.  Genitourinary:  Positive for frequency, vaginal bleeding and vaginal discharge. Negative for dysuria.    Physical Exam Triage Vital Signs ED Triage Vitals [07/15/21 1943]  Enc Vitals Group     BP 114/78     Pulse Rate 82     Resp 18     Temp 98.4 F (36.9 C)      Temp Source Oral     SpO2 97 %     Weight      Height      Head Circumference      Peak Flow      Pain Score 0     Pain Loc      Pain Edu?      Excl. in GC?    No data found.  Updated Vital Signs BP 114/78 (BP Location: Left Arm)   Pulse 82   Temp 98.4 F (36.9 C) (Oral)   Resp 18   LMP 07/10/2021   SpO2 97%      Physical Exam Vitals and nursing note reviewed.  Constitutional:      General: She is not in acute distress.    Appearance: Normal appearance. She is not ill-appearing.  HENT:     Head: Normocephalic and atraumatic.  Eyes:     Conjunctiva/sclera: Conjunctivae normal.  Cardiovascular:     Rate and Rhythm: Normal rate.  Pulmonary:     Effort: Pulmonary effort is normal.  Neurological:     Mental Status: She is alert.  Psychiatric:        Mood and Affect: Mood normal.        Behavior: Behavior normal.        Thought Content: Thought content normal.     UC Treatments / Results  Labs (all labs ordered are listed, but only abnormal results are displayed) Labs Reviewed  POCT URINALYSIS DIP (MANUAL ENTRY) - Abnormal; Notable for the following components:      Result Value   Spec Grav, UA >=1.030 (*)    Blood, UA large (*)    All other components within normal limits  URINE CULTURE  CERVICOVAGINAL ANCILLARY ONLY    EKG   Radiology No results found.  Procedures Procedures (including critical care time)  Medications Ordered in UC Medications - No data to display  Initial Impression / Assessment and Plan / UC Course  I have reviewed the triage vital signs and the nursing notes.  Pertinent labs & imaging results that were available during my care of the patient were reviewed by me and considered in my medical decision making (see chart for details).   STD screening ordered. Will await results for further recommendation. UA without concerning findings.   Final Clinical Impressions(s) /  UC Diagnoses   Final diagnoses:  Screening for STD  (sexually transmitted disease)   Discharge Instructions   Theresa Mooney    ED Prescriptions   Theresa Mooney    PDMP not reviewed this encounter.   Tomi Bamberger, PA-C 07/16/21 [phone]

## 2021-07-17 LAB — CERVICOVAGINAL ANCILLARY ONLY
Bacterial Vaginitis (gardnerella): POSITIVE — AB
Candida Glabrata: NEGATIVE
Candida Vaginitis: NEGATIVE
Chlamydia: NEGATIVE
Comment: NEGATIVE
Comment: NEGATIVE
Comment: NEGATIVE
Comment: NEGATIVE
Comment: NEGATIVE
Comment: NORMAL
Neisseria Gonorrhea: NEGATIVE
Trichomonas: NEGATIVE

## 2021-07-17 LAB — URINE CULTURE: Culture: <10000 — AB

## 2021-07-17 NOTE — Progress Notes (Deleted)
Office Visit Note  Patient: DEEANNA Mooney             Date of Birth: October 02, 1980           MRN: [id number]             PCP: Nicolette Bang, MD Referring: Caryl Never* Visit Date: 07/29/2021 Occupation: _0 @  Subjective:  No chief complaint on file.   History of Present Illness: Theresa Mooney is a 40 y.o. female ***   Activities of Daily Living:  Patient reports morning stiffness for *** {minute/hour:19697}.   Patient {ACTIONS;DENIES/REPORTS:21021675::"Denies"} nocturnal pain.  Difficulty dressing/grooming: {ACTIONS;DENIES/REPORTS:21021675::"Denies"} Difficulty climbing stairs: {ACTIONS;DENIES/REPORTS:21021675::"Denies"} Difficulty getting out of chair: {ACTIONS;DENIES/REPORTS:21021675::"Denies"} Difficulty using hands for taps, buttons, cutlery, and/or writing: {ACTIONS;DENIES/REPORTS:21021675::"Denies"}  No Rheumatology ROS completed.   PMFS History:  Patient Active Problem List   Diagnosis Date Noted   Bacterial vaginitis 03/19/2021   Pain in both lower extremities 03/17/2021   Migraine without status migrainosus, not intractable 06/23/2020   h/o postpartum BTL  12/13/2011   S/P ectopic pregnancy 12/13/2011    Past Medical History:  Diagnosis Date   Asthma    Cannabis dependence in remission ([place])    Ectopic pregnancy    Elevated glucose    Herpes 2002   Infection    Migraine    Mixed hyperlipidemia    Nicotine dependence    Trichimoniasis 12-2010   Urinary calculi     Family History  Problem Relation Age of Onset   Hypertension Mother    Asthma Mother    Hypertension Sister    Stroke Sister    Breast cancer Maternal Aunt    Diabetes Maternal Grandmother    Heart disease Maternal Grandmother    Hypertension Maternal Grandmother    Diabetes Maternal Grandfather    Heart disease Maternal Grandfather    Hypertension Maternal Grandfather    Hyperlipidemia Daughter    Healthy Son    Healthy Son    Healthy Son    Anesthesia  problems Neg Hx    Past Surgical History:  Procedure Laterality Date   DILATION AND CURETTAGE OF UTERUS     LAPAROSCOPY  11/27/2011   Procedure: LAPAROSCOPY OPERATIVE;  Surgeon: Mora Bellman, MD;  Location: [facility];  Service: Gynecology;  Laterality: Bilateral;   SALPINGECTOMY     Bilateral for twin ectopic preg.    TUBAL LIGATION  12-05-2010   Social History   Social History Narrative   Not on file   Immunization History  Administered Date(s) Administered   PFIZER(Purple Top)SARS-COV-2 Vaccination 06/20/2020, 07/11/2020   Tdap 11/06/2014     Objective: Vital Signs: LMP 07/10/2021    Physical Exam   Musculoskeletal Exam: ***  CDAI Exam: CDAI Score: -- Patient Global: --; Provider Global: -- Swollen: --; Tender: -- Joint Exam 07/29/2021   No joint exam has been documented for this visit   There is currently no information documented on the homunculus. Go to the Rheumatology activity and complete the homunculus joint exam.  Investigation: No additional findings.  Imaging: No results found.  Recent Labs: Lab Results  Component Value Date   WBC 4.6 07/06/2021   HGB 10.9 (L) 07/06/2021   PLT 294 07/06/2021   NA 139 07/06/2021   K 4.1 07/06/2021   CL 105 07/06/2021   CO2 29 07/06/2021   GLUCOSE 82 07/06/2021   BUN 9 07/06/2021   CREATININE 0.80 07/06/2021   BILITOT 0.4 07/06/2021   ALKPHOS 97 01/22/2021   AST 13  07/06/2021   ALT 9 07/06/2021   PROT 6.7 07/06/2021   PROT 7.0 07/06/2021   ALBUMIN 4.5 01/22/2021   CALCIUM 8.7 07/06/2021   GFRAA >60 06/07/2020   July 06, 2021 SPEP normal, CK 84, TSH normal, ESR 33, vitamin D 42   Speciality Comments: No specialty comments available.  Procedures:  No procedures performed Allergies: Hydrocodone and Penicillins   Assessment / Plan:     Visit Diagnoses: Elevated sed rate - Repeat sed rate improved.  No synovitis was noted on the examination.  Myalgia - No muscular weakness or tenderness was noted on  the examination.  CK and TSH are normal.  Other fatigue - She has busy schedule at work and at home.  Vitamin D deficiency  Hx of migraines  History of gastroesophageal reflux (GERD) - She is on Prilosec and Carafate.  NSAID long-term use - History of diclofenac use.  Orders: No orders of the defined types were placed in this encounter.  No orders of the defined types were placed in this encounter.   Face-to-face time spent with patient was *** minutes. Greater than 50% of time was spent in counseling and coordination of care.  Follow-Up Instructions: No follow-ups on file.   Bo Merino, MD  Note - This record has been created using Editor, commissioning.  Chart creation errors have been sought, but may not always  have been located. Such creation errors do not reflect on  the standard of medical care.

## 2021-07-21 ENCOUNTER — Telehealth: Payer: Self-pay

## 2021-07-21 MED ORDER — METRONIDAZOLE 500 MG PO TABS: 500.0000 mg | ORAL_TABLET | Freq: Two times a day (BID) | ORAL | 0 refills | Status: DC

## 2021-07-29 ENCOUNTER — Ambulatory Visit: Payer: Medicaid Other | Admitting: Rheumatology

## 2021-07-29 DIAGNOSIS — M791 Myalgia, unspecified site: Secondary | ICD-10-CM

## 2021-07-29 DIAGNOSIS — Z8669 Personal history of other diseases of the nervous system and sense organs: Secondary | ICD-10-CM

## 2021-07-29 DIAGNOSIS — R700 Elevated erythrocyte sedimentation rate: Secondary | ICD-10-CM

## 2021-07-29 DIAGNOSIS — Z791 Long term (current) use of non-steroidal anti-inflammatories (NSAID): Secondary | ICD-10-CM

## 2021-07-29 DIAGNOSIS — R5383 Other fatigue: Secondary | ICD-10-CM

## 2021-07-29 DIAGNOSIS — Z8719 Personal history of other diseases of the digestive system: Secondary | ICD-10-CM

## 2021-07-29 DIAGNOSIS — E559 Vitamin D deficiency, unspecified: Secondary | ICD-10-CM

## 2021-08-05 ENCOUNTER — Other Ambulatory Visit: Payer: Self-pay | Admitting: Internal Medicine

## 2021-08-05 DIAGNOSIS — J4520 Mild intermittent asthma, uncomplicated: Secondary | ICD-10-CM

## 2021-08-06 ENCOUNTER — Ambulatory Visit (INDEPENDENT_AMBULATORY_CARE_PROVIDER_SITE_OTHER): Payer: Medicaid Other | Admitting: Family Medicine

## 2021-08-06 ENCOUNTER — Encounter: Payer: Self-pay | Admitting: Family Medicine

## 2021-08-06 ENCOUNTER — Other Ambulatory Visit: Payer: Self-pay

## 2021-08-06 ENCOUNTER — Encounter: Payer: Self-pay | Admitting: *Deleted

## 2021-08-06 VITALS — BP 118/82 | HR 68 | Temp 98.1°F | Resp 16 | Wt 209.6 lb

## 2021-08-06 DIAGNOSIS — E559 Vitamin D deficiency, unspecified: Secondary | ICD-10-CM

## 2021-08-06 DIAGNOSIS — K219 Gastro-esophageal reflux disease without esophagitis: Secondary | ICD-10-CM | POA: Diagnosis not present

## 2021-08-06 DIAGNOSIS — J4520 Mild intermittent asthma, uncomplicated: Secondary | ICD-10-CM

## 2021-08-06 DIAGNOSIS — F17210 Nicotine dependence, cigarettes, uncomplicated: Secondary | ICD-10-CM

## 2021-08-06 DIAGNOSIS — F17200 Nicotine dependence, unspecified, uncomplicated: Secondary | ICD-10-CM

## 2021-08-06 MED ORDER — VITAMIN D (ERGOCALCIFEROL) 1.25 MG (50000 UNIT) PO CAPS: 50000.0000 [IU] | ORAL_CAPSULE | ORAL | 0 refills | Status: DC

## 2021-08-06 MED ORDER — ALBUTEROL SULFATE HFA 108 (90 BASE) MCG/ACT IN AERS: 2.0000 | INHALATION_SPRAY | Freq: Four times a day (QID) | RESPIRATORY_TRACT | 2 refills | Status: DC | PRN

## 2021-08-06 NOTE — Progress Notes (Signed)
Patient is here for medication refill  

## 2021-08-06 NOTE — Progress Notes (Signed)
Established Patient Office Visit  Subjective:  Patient ID: Theresa Mooney, female    DOB: [birth year]  Age: 40 y.o. MRN: [id number]  CC:  Chief Complaint  Patient presents with   Medication Refill    HPI Theresa Mooney presents for follow up of chronic med issues with med refills. Denies acute complaints or concerns.   Past Medical History:  Diagnosis Date   Asthma    Cannabis dependence in remission [facility])    Ectopic pregnancy    Elevated glucose    Herpes 2002   Infection    Migraine    Mixed hyperlipidemia    Nicotine dependence    Trichimoniasis 12-2010   Urinary calculi       Social History   Socioeconomic History   Marital status: Single    Spouse name: Not on file   Number of children: Not on file   Years of education: Not on file   Highest education level: Not on file  Occupational History   Not on file  Tobacco Use   Smoking status: Former    Types: Cigarettes   Smokeless tobacco: Never  Vaping Use   Vaping Use: Never used  Substance and Sexual Activity   Alcohol use: Yes    Alcohol/week: 1.0 standard drink    Types: 1 Glasses of wine per week    Comment: social   Drug use: Not Currently    Types: Marijuana   Sexual activity: Yes    Birth control/protection: Surgical  Other Topics Concern   Not on file  Social History Narrative   Not on file   Social Determinants of Health   Financial Resource Strain: Not on file  Food Insecurity: Not on file  Transportation Needs: Not on file  Physical Activity: Not on file  Stress: Not on file  Social Connections: Not on file  Intimate Partner Violence: Not on file    ROS Review of Systems  Respiratory: Negative.    Gastrointestinal: Negative.   All other systems reviewed and are negative.  Objective:   Today's Vitals: BP 118/82   Pulse 68   Temp 98.1 F (36.7 C) (Oral)   Resp 16   Wt 209 lb 9.6 oz (95.1 kg)   LMP 07/10/2021   SpO2 98%   BMI 40.93 kg/m   Physical Exam Vitals and  nursing note reviewed.  Constitutional:      General: She is not in acute distress. Cardiovascular:     Rate and Rhythm: Normal rate and regular rhythm.  Pulmonary:     Effort: Pulmonary effort is normal.     Breath sounds: Normal breath sounds.  Abdominal:     Palpations: Abdomen is soft.     Tenderness: There is no abdominal tenderness.  Neurological:     General: No focal deficit present.     Mental Status: She is alert and oriented to person, place, and time.    Assessment & Plan:   1. Mild intermittent asthma without complication Meds refilled. Continue present management - albuterol (VENTOLIN HFA) 108 (90 Base) MCG/ACT inhaler; Inhale 2 puffs into the lungs every 6 (six) hours as needed for wheezing or shortness of breath.  Dispense: 18 g; Refill: 2  2. Gastroesophageal reflux disease without esophagitis Appears stable with present management. Continue and monitor  3. Vitamin D deficiency Meds refilled. continue - Vitamin D, Ergocalciferol, (DRISDOL) 1.25 MG (50000 UNIT) CAPS capsule; Take 1 capsule (50,000 Units total) by mouth every 7 (seven) days.  Dispense: 12 capsule; Refill: 0  4. Smoking Discussed reduction/cessation    Outpatient Encounter Medications as of 08/06/2021  Medication Sig   omeprazole (PRILOSEC) 20 MG capsule Take 1 capsule (20 mg total) by mouth daily.   [DISCONTINUED] albuterol (VENTOLIN HFA) 108 (90 Base) MCG/ACT inhaler INHALE 2 PUFFS INTO THE LUNGS EVERY 6 HOURS AS NEEDED FOR WHEEZING OR SHORTNESS OF BREATH   [DISCONTINUED] metroNIDAZOLE (FLAGYL) 500 MG tablet Take 1 tablet (500 mg total) by mouth 2 (two) times daily.   [DISCONTINUED] Vitamin D, Ergocalciferol, (DRISDOL) 1.25 MG (50000 UNIT) CAPS capsule Take 1 capsule (50,000 Units total) by mouth every 7 (seven) days.   albuterol (VENTOLIN HFA) 108 (90 Base) MCG/ACT inhaler Inhale 2 puffs into the lungs every 6 (six) hours as needed for wheezing or shortness of breath.   Vitamin D,  Ergocalciferol, (DRISDOL) 1.25 MG (50000 UNIT) CAPS capsule Take 1 capsule (50,000 Units total) by mouth every 7 (seven) days.   [DISCONTINUED] SUMAtriptan (IMITREX) 50 MG tablet Take 1 tablet (50 mg total) by mouth every 2 (two) hours as needed for migraine. May repeat in 2 hours if headache persists or recurs. (Patient not taking: Reported on 07/02/2020)   No facility-administered encounter medications on file as of 08/06/2021.    Follow-up: Return in about 6 months (around 02/03/2022) for follow up.   Theresa Raymond, MD

## 2021-08-07 ENCOUNTER — Encounter: Payer: Self-pay | Admitting: Family Medicine

## 2021-09-09 NOTE — Progress Notes (Deleted)
Office Visit Note  Patient: Theresa Mooney             Date of Birth: [birth year]           MRN: [id number]             PCP: Dorna Mai, MD Referring: Theresa Mooney* Visit Date: 09/15/2021 Occupation: @GUAROCC @  Subjective:  No chief complaint on file.   History of Present Illness: Theresa Mooney is a 40 y.o. female ***   Activities of Daily Living:  Patient reports morning stiffness for *** {minute/hour:19697}.   Patient {ACTIONS;DENIES/REPORTS:21021675::"Denies"} nocturnal pain.  Difficulty dressing/grooming: {ACTIONS;DENIES/REPORTS:21021675::"Denies"} Difficulty climbing stairs: {ACTIONS;DENIES/REPORTS:21021675::"Denies"} Difficulty getting out of chair: {ACTIONS;DENIES/REPORTS:21021675::"Denies"} Difficulty using hands for taps, buttons, cutlery, and/or writing: {ACTIONS;DENIES/REPORTS:21021675::"Denies"}  No Rheumatology ROS completed.   PMFS History:  Patient Active Problem List   Diagnosis Date Noted   Bacterial vaginitis 03/19/2021   Pain in both lower extremities 03/17/2021   Migraine without status migrainosus, not intractable 06/23/2020   h/o postpartum BTL  12/13/2011   S/P ectopic pregnancy 12/13/2011    Past Medical History:  Diagnosis Date   Asthma    Cannabis dependence in remission ([place])    Ectopic pregnancy    Elevated glucose    Herpes 2002   Infection    Migraine    Mixed hyperlipidemia    Nicotine dependence    Trichimoniasis 12-2010   Urinary calculi     Family History  Problem Relation Age of Onset   Hypertension Mother    Asthma Mother    Hypertension Sister    Stroke Sister    Breast cancer Maternal Aunt    Diabetes Maternal Grandmother    Heart disease Maternal Grandmother    Hypertension Maternal Grandmother    Diabetes Maternal Grandfather    Heart disease Maternal Grandfather    Hypertension Maternal Grandfather    Hyperlipidemia Daughter    Healthy Son    Healthy Son    Healthy Son    Anesthesia problems  Neg Hx    Past Surgical History:  Procedure Laterality Date   DILATION AND CURETTAGE OF UTERUS     LAPAROSCOPY  11/27/2011   Procedure: LAPAROSCOPY OPERATIVE;  Surgeon: Mora Bellman, MD;  Location: [facility];  Service: Gynecology;  Laterality: Bilateral;   SALPINGECTOMY     Bilateral for twin ectopic preg.    TUBAL LIGATION  12-05-2010   Social History   Social History Narrative   Not on file   Immunization History  Administered Date(s) Administered   PFIZER(Purple Top)SARS-COV-2 Vaccination 06/20/2020, 07/11/2020   Tdap 11/06/2014     Objective: Vital Signs: There were no vitals taken for this visit.   Physical Exam   Musculoskeletal Exam: ***  CDAI Exam: CDAI Score: -- Patient Global: --; Provider Global: -- Swollen: --; Tender: -- Joint Exam 09/15/2021   No joint exam has been documented for this visit   There is currently no information documented on the homunculus. Go to the Rheumatology activity and complete the homunculus joint exam.  Investigation: No additional findings.  Imaging: No results found.  Recent Labs: Lab Results  Component Value Date   WBC 4.6 07/06/2021   HGB 10.9 (L) 07/06/2021   PLT 294 07/06/2021   NA 139 07/06/2021   K 4.1 07/06/2021   CL 105 07/06/2021   CO2 29 07/06/2021   GLUCOSE 82 07/06/2021   BUN 9 07/06/2021   CREATININE 0.80 07/06/2021   BILITOT 0.4 07/06/2021   ALKPHOS 97 01/22/2021  AST 13 07/06/2021   ALT 9 07/06/2021   PROT 6.7 07/06/2021   PROT 7.0 07/06/2021   ALBUMIN 4.5 01/22/2021   CALCIUM 8.7 07/06/2021   GFRAA >60 06/07/2020   July 06, 2021 SPEP normal, CK 84, TSH normal, sed rate 33, vitamin D 42  01/14/21: ESR 45, RF<10   Speciality Comments: No specialty comments available.  Procedures:  No procedures performed Allergies: Hydrocodone and Penicillins   Assessment / Plan:     Visit Diagnoses: Elevated sed rate - Chronic elevation of sedimentation rate.  Myalgia - History of generalized  muscle pain for the last few years.  CK and TSH were normal.  No muscular weakness or tenderness was noted.  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  NSAID long-term use - She is on diclofenac 75 mg p.o. daily.  She takes it on a as needed basis due to reflux symptoms.  History of gastroesophageal reflux (GERD)  Vitamin D deficiency  Hx of migraines  S/P ectopic pregnancy  BMI 40.0-44.9, adult ([place])  Orders: No orders of the defined types were placed in this encounter.  No orders of the defined types were placed in this encounter.   Face-to-face time spent with patient was *** minutes. Greater than 50% of time was spent in counseling and coordination of care.  Follow-Up Instructions: No follow-ups on file.   Bo Merino, MD  Note - This record has been created using Editor, commissioning.  Chart creation errors have been sought, but may not always  have been located. Such creation errors do not reflect on  the standard of medical care.

## 2021-09-15 ENCOUNTER — Ambulatory Visit: Payer: Medicaid Other | Admitting: Rheumatology

## 2021-09-15 ENCOUNTER — Telehealth: Payer: Self-pay

## 2021-09-15 DIAGNOSIS — R700 Elevated erythrocyte sedimentation rate: Secondary | ICD-10-CM

## 2021-09-15 DIAGNOSIS — E559 Vitamin D deficiency, unspecified: Secondary | ICD-10-CM

## 2021-09-15 DIAGNOSIS — M791 Myalgia, unspecified site: Secondary | ICD-10-CM

## 2021-09-15 DIAGNOSIS — Z8719 Personal history of other diseases of the digestive system: Secondary | ICD-10-CM

## 2021-09-15 DIAGNOSIS — Z8759 Personal history of other complications of pregnancy, childbirth and the puerperium: Secondary | ICD-10-CM

## 2021-09-15 DIAGNOSIS — Z791 Long term (current) use of non-steroidal anti-inflammatories (NSAID): Secondary | ICD-10-CM

## 2021-09-15 DIAGNOSIS — Z8669 Personal history of other diseases of the nervous system and sense organs: Secondary | ICD-10-CM

## 2021-09-15 DIAGNOSIS — Z6841 Body Mass Index (BMI) 40.0 and over, adult: Secondary | ICD-10-CM

## 2021-09-15 DIAGNOSIS — R5383 Other fatigue: Secondary | ICD-10-CM

## 2021-09-15 NOTE — Telephone Encounter (Signed)
Investigation: No additional findings.   Imaging: Imaging Results  No results found.     Recent Labs: Recent Labs       Lab Results  Component Value Date    WBC 4.6 07/06/2021    HGB 10.9 (L) 07/06/2021    PLT 294 07/06/2021    NA 139 07/06/2021    K 4.1 07/06/2021    CL 105 07/06/2021    CO2 29 07/06/2021    GLUCOSE 82 07/06/2021    BUN 9 07/06/2021    CREATININE 0.80 07/06/2021    BILITOT 0.4 07/06/2021    ALKPHOS 97 01/22/2021    AST 13 07/06/2021    ALT 9 07/06/2021    PROT 6.7 07/06/2021    PROT 7.0 07/06/2021    ALBUMIN 4.5 01/22/2021    CALCIUM 8.7 07/06/2021    GFRAA >60 06/07/2020      July 06, 2021 SPEP normal, CK 84, TSH normal, sed rate 33, vitamin D 42   01/14/21: ESR 45, RF<10    Speciality Comments: No specialty comments available.   Procedures:  No procedures performed Allergies: Hydrocodone and Penicillins    Assessment / Plan:     Visit Diagnoses: Elevated sed rate - Chronic elevation of sedimentation rate.   Myalgia - History of generalized muscle pain for the last few years.  CK and TSH were normal.  No muscular weakness or tenderness was noted.   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   NSAID long-term use - She is on diclofenac 75 mg p.o. daily.  She takes it on a as needed basis due to reflux symptoms.   History of gastroesophageal reflux (GERD)   Vitamin D deficiency   Hx of migraines   S/P ectopic pregnancy   BMI 40.0-44.9, adult ([place])

## 2021-10-02 NOTE — Progress Notes (Signed)
Office Visit Note  Patient: Theresa Mooney             Date of Birth: 1981-06-19           MRN: [id number]             PCP: Dorna Mai, MD Referring: Dorna Mai, MD Visit Date: 10/14/2021 Occupation: '@GUAROCC' @  Subjective:  Muscle pain and fatigue.   History of Present Illness: Theresa Mooney is a 41 y.o. female with history of muscle pain and fatigue.  She states she continues to have generalized muscle pain and she feels tired.  She also has increased pain in her breast just before her periods.  She has been having increased discomfort in her right breast.  She states she has an appointment coming up with a GYN.  She denies any joint pain or joint swelling.  Activities of Daily Living:  Patient reports morning stiffness for 10-15 minutes.   Patient Reports nocturnal pain.  Difficulty dressing/grooming: Denies Difficulty climbing stairs: Denies Difficulty getting out of chair: Denies Difficulty using hands for taps, buttons, cutlery, and/or writing: Denies  Review of Systems  Constitutional:  Negative for fatigue.  HENT:  Negative for mouth sores, mouth dryness and nose dryness.   Eyes:  Positive for photophobia. Negative for pain, itching and dryness.  Respiratory:  Negative for difficulty breathing.   Cardiovascular:  Negative for chest pain and palpitations.  Gastrointestinal:  Negative for blood in stool, constipation and diarrhea.  Endocrine: Negative for increased urination.  Genitourinary:  Negative for difficulty urinating.  Musculoskeletal:  Positive for joint pain, joint pain, myalgias, morning stiffness, muscle tenderness and myalgias. Negative for joint swelling.  Skin:  Negative for color change, rash and redness.  Allergic/Immunologic: Negative for susceptible to infections.  Neurological:  Positive for dizziness, light-headedness and headaches. Negative for memory loss and weakness.  Hematological:  Negative for bruising/bleeding tendency.   Psychiatric/Behavioral:  Negative for confusion.    PMFS History:  Patient Active Problem List   Diagnosis Date Noted   Bacterial vaginitis 03/19/2021   Pain in both lower extremities 03/17/2021   Migraine without status migrainosus, not intractable 06/23/2020   h/o postpartum BTL  12/13/2011   S/P ectopic pregnancy 12/13/2011    Past Medical History:  Diagnosis Date   Asthma    Cannabis dependence in remission ([place])    Ectopic pregnancy    Elevated glucose    Herpes 2002   Infection    Migraine    Mixed hyperlipidemia    Nicotine dependence    Trichimoniasis 12-2010   Urinary calculi     Family History  Problem Relation Age of Onset   Hypertension Mother    Asthma Mother    Hypertension Sister    Stroke Sister    Breast cancer Maternal Aunt    Diabetes Maternal Grandmother    Heart disease Maternal Grandmother    Hypertension Maternal Grandmother    Diabetes Maternal Grandfather    Heart disease Maternal Grandfather    Hypertension Maternal Grandfather    Hyperlipidemia Daughter    Healthy Son    Healthy Son    Healthy Son    Anesthesia problems Neg Hx    Past Surgical History:  Procedure Laterality Date   DILATION AND CURETTAGE OF UTERUS     LAPAROSCOPY  11/27/2011   Procedure: LAPAROSCOPY OPERATIVE;  Surgeon: Mora Bellman, MD;  Location: [facility];  Service: Gynecology;  Laterality: Bilateral;   SALPINGECTOMY     Bilateral  for twin ectopic preg.    TUBAL LIGATION  12-05-2010   Social History   Social History Narrative   Not on file   Immunization History  Administered Date(s) Administered   PFIZER(Purple Top)SARS-COV-2 Vaccination 06/20/2020, 07/11/2020   Tdap 11/06/2014     Objective: Vital Signs: BP 117/76 (BP Location: Right Arm, Patient Position: Sitting, Cuff Size: Large)    Pulse 73    Ht '4\' 11"'  (1.499 m)    Wt 210 lb 12.8 oz (95.6 kg)    BMI 42.58 kg/m    Physical Exam Vitals and nursing note reviewed.  Constitutional:      Appearance:  She is well-developed.  HENT:     Head: Normocephalic and atraumatic.  Eyes:     Conjunctiva/sclera: Conjunctivae normal.  Cardiovascular:     Rate and Rhythm: Normal rate and regular rhythm.     Heart sounds: Normal heart sounds.  Pulmonary:     Effort: Pulmonary effort is normal.     Breath sounds: Normal breath sounds.  Abdominal:     General: Bowel sounds are normal.     Palpations: Abdomen is soft.  Musculoskeletal:     Cervical back: Normal range of motion.  Lymphadenopathy:     Cervical: No cervical adenopathy.  Skin:    General: Skin is warm and dry.     Capillary Refill: Capillary refill takes less than 2 seconds.  Neurological:     Mental Status: She is alert and oriented to person, place, and time.  Psychiatric:        Behavior: Behavior normal.     Musculoskeletal Exam: C-spine thoracic and lumbar spine with good range of motion.  Shoulder joints, elbow joints, wrist joints, MCPs PIPs and DIPs with good range of motion with no synovitis.  Hip joints, knee joints, ankles, MTPs and PIPs with good range of motion with no synovitis.  She had no muscular weakness or tenderness.  CDAI Exam: CDAI Score: -- Patient Global: --; Provider Global: -- Swollen: --; Tender: -- Joint Exam 10/14/2021   No joint exam has been documented for this visit   There is currently no information documented on the homunculus. Go to the Rheumatology activity and complete the homunculus joint exam.  Investigation: No additional findings.  Imaging: No results found.  Recent Labs: Lab Results  Component Value Date   WBC 4.6 07/06/2021   HGB 10.9 (L) 07/06/2021   PLT 294 07/06/2021   NA 139 07/06/2021   K 4.1 07/06/2021   CL 105 07/06/2021   CO2 29 07/06/2021   GLUCOSE 82 07/06/2021   BUN 9 07/06/2021   CREATININE 0.80 07/06/2021   BILITOT 0.4 07/06/2021   ALKPHOS 97 01/22/2021   AST 13 07/06/2021   ALT 9 07/06/2021   PROT 6.7 07/06/2021   PROT 7.0 07/06/2021   ALBUMIN 4.5  01/22/2021   CALCIUM 8.7 07/06/2021   GFRAA >60 06/07/2020   July 06, 2021 ESR 33, TSH normal, CK normal, SPEP normal, vitamin D 42  November 2021 ANA negative, RF negative,  Speciality Comments: No specialty comments available.  Procedures:  No procedures performed Allergies: Hydrocodone and Penicillins   Assessment / Plan:     Visit Diagnoses: Elevated sed rate - Sed rate is mildly elevated.  No synovitis was noted on the examination.  Her sed rate has been mildly elevated for the last few years.  No synovitis was noted.  All autoimmune work-up has been negative in the past.  Myalgia - She gives  history of generalized muscle pain for the last few years.  CK and TSH are normal.  Left findings were discussed with the patient.  Discussed possibility of myofascial pain syndrome.  I advised her to contact me in case she develops any new symptoms.  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  She does not get much rest.  She states she is mostly stressed in the evening.  Good sleep hygiene was discussed.  Vitamin D deficiency - Her vitamin D was low at 12.8 initially.  She has been on vitamin D supplements.  Her vitamin D is normal now.  NSAID long-term use - Patient has been on diclofenac 75 mg p.o. daily for few years which she takes it as needed basis due to reflux.  History of gastroesophageal reflux (GERD) - She is on Carafate and Prilosec.  Hx of migraines  S/P ectopic pregnancy  BMI 40.0-44.9, adult ([facility])-weight loss diet and exercise was emphasized.  Patient is waiting to get in the weight management clinic.  Dietary modifications were discussed.  Orders: No orders of the defined types were placed in this encounter.  No orders of the defined types were placed in this encounter.  .  Follow-Up Instructions: Return if symptoms worsen or fail to improve, for myalgia.   Bo Merino, MD  Note - This record has been created using Radio producer.  Chart creation errors have been sought, but may not always  have been located. Such creation errors do not reflect on  the standard of medical care.

## 2021-10-14 ENCOUNTER — Other Ambulatory Visit: Payer: Self-pay

## 2021-10-14 ENCOUNTER — Encounter: Payer: Self-pay | Admitting: Rheumatology

## 2021-10-14 ENCOUNTER — Ambulatory Visit (INDEPENDENT_AMBULATORY_CARE_PROVIDER_SITE_OTHER): Payer: Medicaid Other | Admitting: Rheumatology

## 2021-10-14 VITALS — BP 117/76 | HR 73 | Ht 59.0 in | Wt 210.8 lb

## 2021-10-14 DIAGNOSIS — Z791 Long term (current) use of non-steroidal anti-inflammatories (NSAID): Secondary | ICD-10-CM

## 2021-10-14 DIAGNOSIS — R700 Elevated erythrocyte sedimentation rate: Secondary | ICD-10-CM

## 2021-10-14 DIAGNOSIS — Z8759 Personal history of other complications of pregnancy, childbirth and the puerperium: Secondary | ICD-10-CM

## 2021-10-14 DIAGNOSIS — R5383 Other fatigue: Secondary | ICD-10-CM | POA: Diagnosis not present

## 2021-10-14 DIAGNOSIS — M791 Myalgia, unspecified site: Secondary | ICD-10-CM | POA: Diagnosis not present

## 2021-10-14 DIAGNOSIS — Z8719 Personal history of other diseases of the digestive system: Secondary | ICD-10-CM

## 2021-10-14 DIAGNOSIS — E559 Vitamin D deficiency, unspecified: Secondary | ICD-10-CM

## 2021-10-14 DIAGNOSIS — Z6841 Body Mass Index (BMI) 40.0 and over, adult: Secondary | ICD-10-CM

## 2021-10-14 DIAGNOSIS — Z8669 Personal history of other diseases of the nervous system and sense organs: Secondary | ICD-10-CM

## 2021-10-21 ENCOUNTER — Encounter: Payer: Self-pay | Admitting: Family Medicine

## 2021-10-21 ENCOUNTER — Other Ambulatory Visit: Payer: Self-pay

## 2021-10-21 ENCOUNTER — Ambulatory Visit (INDEPENDENT_AMBULATORY_CARE_PROVIDER_SITE_OTHER): Payer: Medicaid Other | Admitting: Family Medicine

## 2021-10-21 VITALS — BP 113/80 | HR 95 | Temp 98.4°F | Resp 16 | Wt 209.2 lb

## 2021-10-21 DIAGNOSIS — N644 Mastodynia: Secondary | ICD-10-CM

## 2021-10-21 NOTE — Progress Notes (Signed)
Patient is here for f/u back pain/breast pain Patient was seen at urgent care with right side breast pain. Patient given cxr,ekg that was normal. Patient given  pain medication that she has not pick up as off yet. Pain today is 4/10

## 2021-10-23 ENCOUNTER — Encounter: Payer: Self-pay | Admitting: Family Medicine

## 2021-10-23 NOTE — Progress Notes (Signed)
Established Patient Office Visit  Subjective:  Patient ID: Theresa Mooney, female    DOB: 1980/11/01  Age: 41 y.o. MRN: [id number]  CC:  Chief Complaint  Patient presents with   Follow-up   Back Pain    HPI Theresa Mooney presents for complaint of breast pain. She reports that it seems to be intermittent or have intermittent exacerbations. She has tried to change some things in her diet to help it. She is unsure if it correlates to her menstrual cycle.   Past Medical History:  Diagnosis Date   Asthma    Cannabis dependence in remission [facility])    Ectopic pregnancy    Elevated glucose    Herpes 2002   Infection    Migraine    Mixed hyperlipidemia    Nicotine dependence    Trichimoniasis 12-2010   Urinary calculi     Past Surgical History:  Procedure Laterality Date   DILATION AND CURETTAGE OF UTERUS     LAPAROSCOPY  11/27/2011   Procedure: LAPAROSCOPY OPERATIVE;  Surgeon: Catalina Antigua, MD;  Location: [facility];  Service: Gynecology;  Laterality: Bilateral;   SALPINGECTOMY     Bilateral for twin ectopic preg.    TUBAL LIGATION  12-05-2010    Family History  Problem Relation Age of Onset   Hypertension Mother    Asthma Mother    Hypertension Sister    Stroke Sister    Breast cancer Maternal Aunt    Diabetes Maternal Grandmother    Heart disease Maternal Grandmother    Hypertension Maternal Grandmother    Diabetes Maternal Grandfather    Heart disease Maternal Grandfather    Hypertension Maternal Grandfather    Hyperlipidemia Daughter    Healthy Son    Healthy Son    Healthy Son    Anesthesia problems Neg Hx     Social History   Socioeconomic History   Marital status: Single    Spouse name: Not on file   Number of children: Not on file   Years of education: Not on file   Highest education level: Not on file  Occupational History   Not on file  Tobacco Use   Smoking status: Former    Types: Cigarettes   Smokeless tobacco: Never  Vaping Use   Vaping  Use: Never used  Substance and Sexual Activity   Alcohol use: Yes    Alcohol/week: 1.0 standard drink    Types: 1 Glasses of wine per week    Comment: social   Drug use: Not Currently    Types: Marijuana   Sexual activity: Yes    Birth control/protection: Surgical  Other Topics Concern   Not on file  Social History Narrative   Not on file   Social Determinants of Health   Financial Resource Strain: Not on file  Food Insecurity: Not on file  Transportation Needs: Not on file  Physical Activity: Not on file  Stress: Not on file  Social Connections: Not on file  Intimate Partner Violence: Not on file    ROS Review of Systems  All other systems reviewed and are negative.  Objective:   Today's Vitals: BP 113/80    Pulse 95    Temp 98.4 F (36.9 C) (Oral)    Resp 16    Wt 209 lb 3.2 oz (94.9 kg)    SpO2 97%    BMI 42.25 kg/m   Physical Exam Vitals and nursing note reviewed.  Constitutional:      General:  She is not in acute distress. Cardiovascular:     Rate and Rhythm: Normal rate and regular rhythm.  Pulmonary:     Effort: Pulmonary effort is normal.     Breath sounds: Normal breath sounds.  Chest:  Breasts:    Breasts are symmetrical.     Right: Tenderness present. No mass.     Left: No mass or tenderness.  Neurological:     Mental Status: She is alert.    Assessment & Plan:   1. Breast pain, right Diagnostic mammogram and u/s ordered  - MM Digital Diagnostic Bilat; Future - US BREAST LTD UNI RIGHT INC AXILLA; Future    Outpatient Encounter Medications as of 10/21/2021  Medication Sig   albuterol (VENTOLIN HFA) 108 (90 Base) MCG/ACT inhaler Inhale 2 puffs into the lungs every 6 (six) hours as needed for wheezing or shortness of breath.   Diclofenac Sodium CR 100 MG 24 hr tablet Take by mouth.   omeprazole (PRILOSEC) 20 MG capsule Take 1 capsule (20 mg total) by mouth daily.   Vitamin D, Ergocalciferol, (DRISDOL) 1.25 MG (50000 UNIT) CAPS capsule Take 1  capsule (50,000 Units total) by mouth every 7 (seven) days.   No facility-administered encounter medications on file as of 10/21/2021.    Follow-up: No follow-ups on file.   Theresa Raymond, MD

## 2021-10-26 ENCOUNTER — Ambulatory Visit: Payer: Medicaid Other | Admitting: Obstetrics and Gynecology

## 2021-10-26 ENCOUNTER — Encounter: Payer: Self-pay | Admitting: Obstetrics and Gynecology

## 2021-10-26 ENCOUNTER — Other Ambulatory Visit (HOSPITAL_COMMUNITY)
Admission: RE | Admit: 2021-10-26 | Discharge: 2021-10-26 | Disposition: A | Discharge status: Home / Self Care | Payer: Medicaid Other | Source: Ambulatory Visit | Attending: Obstetrics and Gynecology | Admitting: Obstetrics and Gynecology

## 2021-10-26 ENCOUNTER — Other Ambulatory Visit: Payer: Self-pay

## 2021-10-26 VITALS — BP 115/76 | HR 94 | Ht 59.0 in | Wt 215.8 lb

## 2021-10-26 DIAGNOSIS — N761 Subacute and chronic vaginitis: Secondary | ICD-10-CM | POA: Diagnosis not present

## 2021-10-26 MED ORDER — BORIC ACID CRYS: 600.0000 mg | CRYSTALS | Freq: Every day | 5 refills | Status: DC

## 2021-10-26 NOTE — Progress Notes (Signed)
41 yo P5 with BMI 45 presenting today for the evaluation of recurrent BV and breast tenderness. Patient states that breast tenderness started a week prior to onset of menses ans resolved soon after. She has a family history of breast cancer and was anxious to be evaluated. She was seen a [place] [facility] and is awaiting a mammogram on 11/13/21. Patient is sexually active and reports recurrent BV infections following intercourse. She also reports some concerns for a UTI as she reports some dysuria.   Past Medical History:  Diagnosis Date   Asthma    Cannabis dependence in remission [facility])    Ectopic pregnancy    Elevated glucose    Herpes 2002   Infection    Migraine    Mixed hyperlipidemia    Nicotine dependence    Trichimoniasis 12-2010   Urinary calculi    Past Surgical History:  Procedure Laterality Date   DILATION AND CURETTAGE OF UTERUS     LAPAROSCOPY  11/27/2011   Procedure: LAPAROSCOPY OPERATIVE;  Surgeon: Catalina Antigua, MD;  Location: [facility];  Service: Gynecology;  Laterality: Bilateral;   SALPINGECTOMY     Bilateral for twin ectopic preg.    TUBAL LIGATION  12-05-2010   Family History  Problem Relation Age of Onset   Hypertension Mother    Asthma Mother    Hypertension Sister    Stroke Sister    Breast cancer Maternal Aunt    Diabetes Maternal Grandmother    Heart disease Maternal Grandmother    Hypertension Maternal Grandmother    Diabetes Maternal Grandfather    Heart disease Maternal Grandfather    Hypertension Maternal Grandfather    Hyperlipidemia Daughter    Healthy Son    Healthy Son    Healthy Son    Anesthesia problems Neg Hx    Social History   Tobacco Use   Smoking status: Former    Types: Cigarettes   Smokeless tobacco: Never  Vaping Use   Vaping Use: Never used  Substance Use Topics   Alcohol use: Yes    Alcohol/week: 1.0 standard drink    Types: 1 Glasses of wine per week    Comment: social   Drug use: Not Currently    Types:  Marijuana   ROS See pertinent in HPI. All other systems reviewed and non contributory Blood pressure 115/76, pulse 94, height 4\' 11"  (1.499 m), weight 215 lb 12.8 oz (97.9 kg). GENERAL: Well-developed, well-nourished female in no acute distress.  NEURO: alert and oriented x 3  A/P 41 yo with vaginitis - Urine culture collected - Vaginal swab collected - Rx boric acid provided - Will follow up breast mammogram. Reassurance provided as it is likely progesterone effect - Patient up to date on pap smear - Patient will be contacted with abnormal results - Patient desires contact information of infertility specialist as she is considering IVF - RTC prn

## 2021-10-26 NOTE — Progress Notes (Signed)
Reports "breast" pain. Report pain is mostly in right breast but radiates to left side and to right axilla. Reports right breast is tender to touch. Reports mammogram and breast US ordered by [facility] (not PCP).   Also reports recurrent BV.  Also reports urinary discomfort, low abd pain.

## 2021-10-27 LAB — CERVICOVAGINAL ANCILLARY ONLY
Bacterial Vaginitis (gardnerella): NEGATIVE
Candida Glabrata: NEGATIVE
Candida Vaginitis: NEGATIVE
Chlamydia: NEGATIVE
Comment: NEGATIVE
Comment: NEGATIVE
Comment: NEGATIVE
Comment: NEGATIVE
Comment: NEGATIVE
Comment: NORMAL
Neisseria Gonorrhea: NEGATIVE
Trichomonas: NEGATIVE

## 2021-10-28 ENCOUNTER — Other Ambulatory Visit: Payer: Self-pay

## 2021-10-28 DIAGNOSIS — R300 Dysuria: Secondary | ICD-10-CM

## 2021-10-28 MED ORDER — NITROFURANTOIN MONOHYD MACRO 100 MG PO CAPS
100.0000 mg | ORAL_CAPSULE | Freq: Two times a day (BID) | ORAL | 0 refills | Status: AC
Start: 2021-10-28 — End: 2021-11-02

## 2021-10-28 MED ORDER — PHENAZOPYRIDINE HCL 200 MG PO TABS
200.0000 mg | ORAL_TABLET | Freq: Three times a day (TID) | ORAL | 0 refills | Status: AC | PRN
Start: 2021-10-28 — End: 2021-10-30

## 2021-10-29 LAB — URINE CULTURE

## 2021-11-13 ENCOUNTER — Ambulatory Visit: Payer: Medicaid Other

## 2021-11-13 ENCOUNTER — Ambulatory Visit
Admission: RE | Admit: 2021-11-13 | Discharge: 2021-11-13 | Disposition: A | Discharge status: Home / Self Care | Payer: Medicaid Other | Source: Ambulatory Visit | Attending: Family Medicine | Admitting: Family Medicine

## 2021-11-13 DIAGNOSIS — N644 Mastodynia: Secondary | ICD-10-CM

## 2022-01-20 NOTE — Progress Notes (Incomplete)
01/20/22 ?5:23 PM  ? ?Theresa Mooney ?Sep 28, 1980 ?[id number] ? ?Referring provider:  ?Arvilla Market, MD ?[address] ?[address] ?[place],  [place] [address] ?No chief complaint on file. ? ? ?Urological history: ?Microscopic hematuria  ?- Negative cystoscopy with Dr. Lonna Cobb in 2021  ? ?2. Flank pain  ?- She underwent CT stone study on 05/01/2020 with punctate nonobstructing bilateral renal stones without evidence of hydronephrosis or mass. ?- RUS 02/10/2021 that was unremarkable ? ?HPI: ?Theresa Mooney is a 41 y.o.female who presents today for a 1 year annual.  ? ? ? ? ? ?PMH: ?Past Medical History:  ?Diagnosis Date  ? Asthma   ? Cannabis dependence in remission [facility])   ? Ectopic pregnancy   ? Elevated glucose   ? Herpes 2002  ? Infection   ? Migraine   ? Mixed hyperlipidemia   ? Nicotine dependence   ? Trichimoniasis 12-2010  ? Urinary calculi   ? ? ?Surgical History: ?Past Surgical History:  ?Procedure Laterality Date  ? DILATION AND CURETTAGE OF UTERUS    ? LAPAROSCOPY  11/27/2011  ? Procedure: LAPAROSCOPY OPERATIVE;  Surgeon: Catalina Antigua, MD;  Location: [facility];  Service: Gynecology;  Laterality: Bilateral;  ? SALPINGECTOMY    ? Bilateral for twin ectopic preg.   ? TUBAL LIGATION  12-05-2010  ? ? ?Home Medications:  ?Allergies as of 01/21/2022   ? ?   Reactions  ? Hydrocodone Nausea Only  ? Dizziness  ? Penicillins   ? Did it involve swelling of the face/tongue/throat, SOB, or low BP? Y ?Did it involve sudden or severe rash/hives, skin peeling, or any reaction on the inside of your mouth or nose? N ?Did you need to seek medical attention at a hospital or doctor's office? Y ?When did it last happen?  Over 1 month     ?If all above answers are "NO", may proceed with cephalosporin use.  ? ?  ? ?  ?Medication List  ?  ? ?  ? Accurate as of January 20, 2022  5:23 PM. If you have any questions, ask your nurse or doctor.  ?  ?  ? ?  ? ?albuterol 108 (90 Base) MCG/ACT inhaler ?Commonly known as: VENTOLIN HFA ?Inhale 2  puffs into the lungs every 6 (six) hours as needed for wheezing or shortness of breath. ?  ?Boric Acid [place] ?Place 600 mg vaginally at bedtime. Use vaginally every night for two weeks then twice a week ?  ?Diclofenac Sodium CR 100 MG 24 hr tablet ?Take by mouth. ?  ?omeprazole 20 MG capsule ?Commonly known as: PRILOSEC ?Take 1 capsule (20 mg total) by mouth daily. ?  ?Vitamin D (Ergocalciferol) 1.25 MG (50000 UNIT) Caps capsule ?Commonly known as: DRISDOL ?Take 1 capsule (50,000 Units total) by mouth every 7 (seven) days. ?  ? ?  ? ? ?Allergies:  ?Allergies  ?Allergen Reactions  ? Hydrocodone Nausea Only  ?  Dizziness  ? Penicillins   ?  Did it involve swelling of the face/tongue/throat, SOB, or low BP? Y ?Did it involve sudden or severe rash/hives, skin peeling, or any reaction on the inside of your mouth or nose? N ?Did you need to seek medical attention at a hospital or doctor's office? Y ?When did it last happen?  Over 1 month     ?If all above answers are "NO", may proceed with cephalosporin use. ? ?  ? ? ?Family History: ?Family History  ?Problem Relation Age of Onset  ?  Hypertension Mother   ? Asthma Mother   ? Hypertension Sister   ? Stroke Sister   ? Breast cancer Maternal Aunt   ? Diabetes Maternal Grandmother   ? Heart disease Maternal Grandmother   ? Hypertension Maternal Grandmother   ? Diabetes Maternal Grandfather   ? Heart disease Maternal Grandfather   ? Hypertension Maternal Grandfather   ? Hyperlipidemia Daughter   ? Healthy Son   ? Healthy Son   ? Healthy Son   ? Anesthesia problems Neg Hx   ? ? ?Social History:  reports that she has quit smoking. Her smoking use included cigarettes. She has never used smokeless tobacco. She reports current alcohol use of about 1.0 standard drink per week. She reports that she does not currently use drugs after having used the following drugs: Marijuana. ? ? ?Physical Exam: ?There were no vitals taken for this visit.  ?Constitutional:  Alert and oriented, No  acute distress. ?HEENT: [place] AT, moist mucus membranes.  Trachea midline, no masses. ?Cardiovascular: No clubbing, cyanosis, or edema. ?Respiratory: Normal respiratory effort, no increased work of breathing. ?Skin: No rashes, bruises or suspicious lesions. ?Neurologic: Grossly intact, no focal deficits, moving all 4 extremities. ?Psychiatric: Normal mood and affect. ? ? ?Laboratory Data: ? ?Lab Results  ?Component Value Date  ? CREATININE 0.80 07/06/2021  ? ? ? ?Lab Results  ?Component Value Date  ? HGBA1C 4.8 08/08/2018  ? ? ?Urinalysis ? ? ?Pertinent Imaging: ? ? ?Assessment & Plan:   ? ? ?No follow-ups on file. ? ?[facility] ?[address] ?[place], [place] [address] ?[phone] ?

## 2022-01-21 ENCOUNTER — Ambulatory Visit: Payer: Medicaid Other | Admitting: Urology

## 2022-01-25 ENCOUNTER — Other Ambulatory Visit: Payer: Self-pay | Admitting: Family Medicine

## 2022-01-25 DIAGNOSIS — E559 Vitamin D deficiency, unspecified: Secondary | ICD-10-CM

## 2022-01-25 NOTE — Telephone Encounter (Signed)
Medication Refill - Medication: Vitamin D, Ergocalciferol, (DRISDOL) 1.25 MG (50000 UNIT) CAPS capsule [[id number]]  ? ?Has the patient contacted their pharmacy? Yes.   ? ? ?Preferred Pharmacy (with phone number or street name):  ?[facility] [phone] [place], [place] - [address] AT [facility] [address]  ?[address] [place] [address]  ?Phone: [phone] Fax: [phone]  ?Hours: Not open 24 hours  ? ? ? ?Has the patient been seen for an appointment in the last year OR does the patient have an upcoming appointment? Yes.  06/23 ? ?Agent: Please be advised that RX refills may take up to 3 business days. We ask that you follow-up with your pharmacy. ? ?

## 2022-01-26 NOTE — Telephone Encounter (Signed)
Requested medication (s) are due for refill today - unsure ? ?Requested medication (s) are on the active medication list -yes ? ?Future visit scheduled -yes ? ?Last refill: 08/06/21 #12 ? ?Notes to clinic: Request RF: high dose-provider review  ? ?Requested Prescriptions  ?Pending Prescriptions Disp Refills  ? Vitamin D, Ergocalciferol, (DRISDOL) 1.25 MG (50000 UNIT) CAPS capsule 12 capsule 0  ?  Sig: Take 1 capsule (50,000 Units total) by mouth every 7 (seven) days.  ?  ? Endocrinology:  Vitamins - Vitamin D Supplementation 2 Failed - 01/25/2022  5:11 PM  ?  ?  Failed - Manual Review: Route requests for 50,000 IU strength to the provider  ?  ?  Passed - Ca in normal range and within 360 days  ?  Calcium  ?Date Value Ref Range Status  ?07/06/2021 8.7 8.6 - 10.2 mg/dL Final  ?  ?  ?  ?  Passed - Vitamin D in normal range and within 360 days  ?  Vit D, 25-Hydroxy  ?Date Value Ref Range Status  ?07/06/2021 42 30 - 100 ng/mL Final  ?  Comment:  ?  Vitamin D Status         25-OH Vitamin D: ?. ?Deficiency:                    <20 ng/mL ?Insufficiency:             20 - 29 ng/mL ?Optimal:                 > or = 30 ng/mL ?Marland Kitchen ?For 25-OH Vitamin D testing on patients on  ?D2-supplementation and patients for whom quantitation  ?of D2 and D3 fractions is required, the [facility](TM) ?25-OH VIT D, (D2,D3), LC/MS/MS is recommended: order  ?code [phone] (patients >38yrs). ?See Note 1 ?. ?Note 1 ?. ?For additional information, please refer to  ?http://education.[facility].com/faq/FAQ199  ?(This link is being provided for informational/ ?educational purposes only.) ?  ?  ?  ?  ?  Passed - Valid encounter within last 12 months  ?  Recent Outpatient Visits   ? ?      ? 3 months ago Breast pain, right  ? Primary Care at [facility], Clyde Canterbury, MD  ? 5 months ago Mild intermittent asthma without complication  ? Primary Care at [facility], MD  ? 1 year ago Syncope, unspecified syncope type  ? Primary Care at  [facility], Bayard Beaver, MD  ? 1 year ago Vitamin D deficiency  ? Primary Care at [facility], Bayard Beaver, MD  ? 1 year ago Chronic pain of multiple joints  ? Primary Care at [facility], Bayard Beaver, MD  ? ?  ?  ?Future Appointments   ? ?        ? In 1 week McGowan, Gordan Payment [place]  ? In 1 month Dorna Mai, MD Primary Care at [facility]  ? ?  ? ? ?  ?  ?  ? ? ? ?Requested Prescriptions  ?Pending Prescriptions Disp Refills  ? Vitamin D, Ergocalciferol, (DRISDOL) 1.25 MG (50000 UNIT) CAPS capsule 12 capsule 0  ?  Sig: Take 1 capsule (50,000 Units total) by mouth every 7 (seven) days.  ?  ? Endocrinology:  Vitamins - Vitamin D Supplementation 2 Failed - 01/25/2022  5:11 PM  ?  ?  Failed - Manual Review: Route requests for 50,000 IU strength to the provider  ?  ?  Passed - Ca in normal range and within 360 days  ?  Calcium  ?Date Value Ref Range Status  ?07/06/2021 8.7 8.6 - 10.2 mg/dL Final  ?  ?  ?  ?  Passed - Vitamin D in normal range and within 360 days  ?  Vit D, 25-Hydroxy  ?Date Value Ref Range Status  ?07/06/2021 42 30 - 100 ng/mL Final  ?  Comment:  ?  Vitamin D Status         25-OH Vitamin D: ?. ?Deficiency:                    <20 ng/mL ?Insufficiency:             20 - 29 ng/mL ?Optimal:                 > or = 30 ng/mL ?Marland Kitchen ?For 25-OH Vitamin D testing on patients on  ?D2-supplementation and patients for whom quantitation  ?of D2 and D3 fractions is required, the QuestAssureD(TM) ?25-OH VIT D, (D2,D3), LC/MS/MS is recommended: order  ?code [phone] (patients >48yrs). ?See Note 1 ?. ?Note 1 ?. ?For additional information, please refer to  ?http://education.[facility].com/faq/FAQ199  ?(This link is being provided for informational/ ?educational purposes only.) ?  ?  ?  ?  ?  Passed - Valid encounter within last 12 months  ?  Recent Outpatient Visits   ? ?      ? 3 months ago Breast pain, right  ? Primary Care at [facility], Clyde Canterbury, MD  ? 5 months ago Mild intermittent asthma without complication  ? Primary Care at [facility], MD  ? 1 year ago Syncope, unspecified syncope type  ? Primary Care at [facility], Bayard Beaver, MD  ? 1 year ago Vitamin D deficiency  ? Primary Care at [facility], Bayard Beaver, MD  ? 1 year ago Chronic pain of multiple joints  ? Primary Care at [facility], Bayard Beaver, MD  ? ?  ?  ?Future Appointments   ? ?        ? In 1 week McGowan, Gordan Payment [place]  ? In 1 month Dorna Mai, MD Primary Care at [facility]  ? ?  ? ? ?  ?  ?  ? ? ? ?

## 2022-01-27 MED ORDER — VITAMIN D (ERGOCALCIFEROL) 1.25 MG (50000 UNIT) PO CAPS: 50000.0000 [IU] | ORAL_CAPSULE | ORAL | 0 refills | Status: DC

## 2022-02-01 NOTE — Progress Notes (Deleted)
02/01/22 9:25 AM   Theresa Mooney 1981/02/15 [id number]  Referring provider:  Dorna Mai, MD [address] [place],  [place] [address]  Urological history: 1. Intermediate risk hematuria -former smoker -RUS, 01/2021 NED -CT renal stone study, 04/2020 - punctate stones -Negative cystoscopy with Dr. Bernardo Heater in 2021  -no reports of gross heme -UA ***   HPI: Theresa Mooney is a 41 y.o.female who presents today for a 1 year annual.   UA ***    PMH: Past Medical History:  Diagnosis Date   Asthma    Cannabis dependence in remission ([place])    Ectopic pregnancy    Elevated glucose    Herpes 2002   Infection    Migraine    Mixed hyperlipidemia    Nicotine dependence    Trichimoniasis 12-2010   Urinary calculi     Surgical History: Past Surgical History:  Procedure Laterality Date   DILATION AND CURETTAGE OF UTERUS     LAPAROSCOPY  11/27/2011   Procedure: LAPAROSCOPY OPERATIVE;  Surgeon: Mora Bellman, MD;  Location: [facility];  Service: Gynecology;  Laterality: Bilateral;   SALPINGECTOMY     Bilateral for twin ectopic preg.    TUBAL LIGATION  12-05-2010    Home Medications:  Allergies as of 02/02/2022       Reactions   Hydrocodone Nausea Only   Dizziness   Penicillins    Did it involve swelling of the face/tongue/throat, SOB, or low BP? Y Did it involve sudden or severe rash/hives, skin peeling, or any reaction on the inside of your mouth or nose? N Did you need to seek medical attention at a hospital or doctor's office? Y When did it last happen?  Over 1 month     If all above answers are "NO", may proceed with cephalosporin use.        Medication List        Accurate as of Feb 01, 2022  9:25 AM. If you have any questions, ask your nurse or doctor.          albuterol 108 (90 Base) MCG/ACT inhaler Commonly known as: VENTOLIN HFA Inhale 2 puffs into the lungs every 6 (six) hours as needed for wheezing or shortness of breath.   Boric Acid  [place] 600 mg vaginally at bedtime. Use vaginally every night for two weeks then twice a week   Diclofenac Sodium CR 100 MG 24 hr tablet Take by mouth.   omeprazole 20 MG capsule Commonly known as: PRILOSEC Take 1 capsule (20 mg total) by mouth daily.   Vitamin D (Ergocalciferol) 1.25 MG (50000 UNIT) Caps capsule Commonly known as: DRISDOL Take 1 capsule (50,000 Units total) by mouth every 7 (seven) days.        Allergies:  Allergies  Allergen Reactions   Hydrocodone Nausea Only    Dizziness   Penicillins     Did it involve swelling of the face/tongue/throat, SOB, or low BP? Y Did it involve sudden or severe rash/hives, skin peeling, or any reaction on the inside of your mouth or nose? N Did you need to seek medical attention at a hospital or doctor's office? Y When did it last happen?  Over 1 month     If all above answers are "NO", may proceed with cephalosporin use.      Family History: Family History  Problem Relation Age of Onset   Hypertension Mother    Asthma Mother    Hypertension Sister  Stroke Sister    Breast cancer Maternal Aunt    Diabetes Maternal Grandmother    Heart disease Maternal Grandmother    Hypertension Maternal Grandmother    Diabetes Maternal Grandfather    Heart disease Maternal Grandfather    Hypertension Maternal Grandfather    Hyperlipidemia Daughter    Healthy Son    Healthy Son    Healthy Son    Anesthesia problems Neg Hx     Social History:  reports that she has quit smoking. Her smoking use included cigarettes. She has never used smokeless tobacco. She reports current alcohol use of about 1.0 standard drink per week. She reports that she does not currently use drugs after having used the following drugs: Marijuana.   Physical Exam: There were no vitals taken for this visit.  Constitutional:  Well nourished. Alert and oriented, No acute distress. HEENT: [place] AT, moist mucus membranes.  Trachea midline, no  masses. Cardiovascular: No clubbing, cyanosis, or edema. Respiratory: Normal respiratory effort, no increased work of breathing. GI: Abdomen is soft, non tender, non distended, no abdominal masses. Liver and spleen not palpable.  No hernias appreciated.  Stool sample for occult testing is not indicated.   GU: No CVA tenderness.  No bladder fullness or masses.  *** external genitalia, *** pubic hair distribution, no lesions.  Normal urethral meatus, no lesions, no prolapse, no discharge.   No urethral masses, tenderness and/or tenderness. No bladder fullness, tenderness or masses. *** vagina mucosa, *** estrogen effect, no discharge, no lesions, *** pelvic support, *** cystocele and *** rectocele noted.  No cervical motion tenderness.  Uterus is freely mobile and non-fixed.  No adnexal/parametria masses or tenderness noted.  Anus and perineum are without rashes or lesions.   ***  Skin: No rashes, bruises or suspicious lesions. Lymph: No cervical or inguinal adenopathy. Neurologic: Grossly intact, no focal deficits, moving all 4 extremities. Psychiatric: Normal mood and affect.    Laboratory Data:    Latest Ref Rng & Units 07/06/2021   10:55 AM 01/22/2021    1:15 PM 01/05/2021    5:13 PM  CMP  Glucose 65 - 99 mg/dL 82   94   90    BUN 7 - 25 mg/dL 9   11   16     Creatinine 0.50 - 0.97 mg/dL 0.80   0.97   0.96    Sodium 135 - 146 mmol/L 139   139   135    Potassium 3.5 - 5.3 mmol/L 4.1   3.8   5.0    Chloride 98 - 110 mmol/L 105   103   101    CO2 20 - 32 mmol/L 29   27   24     Calcium 8.6 - 10.2 mg/dL 8.7   10.1   9.7    Total Protein 6.1 - 8.1 g/dL 6.7     7.0   7.9   8.5    Total Bilirubin 0.2 - 1.2 mg/dL 0.4   0.5   0.6    Alkaline Phos 38 - 126 U/L  97   78    AST 10 - 30 U/L 13   13   21     ALT 6 - 29 U/L 9   8   10          Latest Ref Rng & Units 07/06/2021   10:55 AM 01/22/2021    1:15 PM 01/05/2021    5:13 PM  CBC  WBC 3.8 - 10.8 Thousand/uL 4.6  4.1   6.6    Hemoglobin 11.7  - 15.5 g/dL 10.9   13.6   12.3    Hematocrit 35.0 - 45.0 % 35.3   44.1   39.4    Platelets 140 - 400 Thousand/uL 294   327   337     Urinalysis *** I have reviewed the labs.   Pertinent Imaging: No recent imaging since last visit  Assessment & Plan:    1. Intermediate risk hematuria -work up in 2021 - NED -no reports of gross heme -UA ***    No follow-ups on file.  [facility] [address] [place], [place] [address] [phone]

## 2022-02-02 ENCOUNTER — Encounter: Payer: Self-pay | Admitting: Urology

## 2022-02-02 ENCOUNTER — Ambulatory Visit: Payer: Medicaid Other | Admitting: Urology

## 2022-02-02 DIAGNOSIS — R319 Hematuria, unspecified: Secondary | ICD-10-CM

## 2022-02-09 NOTE — Progress Notes (Deleted)
02/09/22 1:10 PM   Theresa Mooney 04/04/1981 [id number]  Referring provider:  Georganna Skeans, MD [address] [place],  [place] [address]  Urological history: 1. Intermediate risk hematuria -former smoker -RUS, 01/2021 NED -CT renal stone study, 04/2020 - punctate stones -Negative cystoscopy with Dr. Lonna Cobb in 2021  -no reports of gross heme -UA ***   HPI: Theresa Mooney is a 41 y.o.female who presents today for a 1 year annual.   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    TUBAL LIGATION  12-05-2010    Home Medications:  Allergies as of 02/10/2022       Reactions   Hydrocodone Nausea Only   Dizziness   Penicillins    Did it involve swelling of the face/tongue/throat, SOB, or low BP? Y Did it involve sudden or severe rash/hives, skin peeling, or any reaction on the inside of your mouth or nose? N Did you need to seek medical attention at a hospital or doctor's office? Y When did it last happen?  Over 1 month     If all above answers are "NO", may proceed with cephalosporin use.        Medication List        Accurate as of Feb 09, 2022  1:10 PM. If you have any questions, ask your nurse or doctor.          albuterol 108 (90 Base) MCG/ACT inhaler Commonly known as: VENTOLIN HFA Inhale 2 puffs into the lungs every 6 (six) hours as needed for wheezing or shortness of breath.   Boric  Acid [place] 600 mg vaginally at bedtime. Use vaginally every night for two weeks then twice a week   Diclofenac Sodium CR 100 MG 24 hr tablet Take by mouth.   omeprazole 20 MG capsule Commonly known as: PRILOSEC Take 1 capsule (20 mg total) by mouth daily.   Vitamin D (Ergocalciferol) 1.25 MG (50000 UNIT) Caps capsule Commonly known as: DRISDOL Take 1 capsule (50,000 Units total) by mouth every 7 (seven) days.        Allergies:  Allergies  Allergen Reactions   Hydrocodone Nausea Only    Dizziness   Penicillins     Did it involve swelling of the face/tongue/throat, SOB, or low BP? Y Did it involve sudden or severe rash/hives, skin peeling, or any reaction on the inside of your mouth or nose? N Did you need to seek medical attention at a hospital or doctor's office? Y When did it last happen?  Over 1 month     If all above answers are "NO", may proceed with cephalosporin use.      Family History: Family History  Problem Relation Age of Onset   Hypertension Mother    Asthma Mother    Hypertension Sister  Stroke Sister    Breast cancer Maternal Aunt    Diabetes Maternal Grandmother    Heart disease Maternal Grandmother    Hypertension Maternal Grandmother    Diabetes Maternal Grandfather    Heart disease Maternal Grandfather    Hypertension Maternal Grandfather    Hyperlipidemia Daughter    Healthy Son    Healthy Son    Healthy Son    Anesthesia problems Neg Hx     Social History:  reports that she has quit smoking. Her smoking use included cigarettes. She has never used smokeless tobacco. She reports current alcohol use of about 1.0 standard drink per week. She reports that she does not currently use drugs after having used the following drugs: Marijuana.   Physical Exam: There were no vitals taken for this visit.  Constitutional:  Well nourished. Alert and oriented, No acute distress. HEENT: [place] AT, moist mucus membranes.  Trachea midline, no  masses. Cardiovascular: No clubbing, cyanosis, or edema. Respiratory: Normal respiratory effort, no increased work of breathing. GI: Abdomen is soft, non tender, non distended, no abdominal masses. Liver and spleen not palpable.  No hernias appreciated.  Stool sample for occult testing is not indicated.   GU: No CVA tenderness.  No bladder fullness or masses.  *** external genitalia, *** pubic hair distribution, no lesions.  Normal urethral meatus, no lesions, no prolapse, no discharge.   No urethral masses, tenderness and/or tenderness. No bladder fullness, tenderness or masses. *** vagina mucosa, *** estrogen effect, no discharge, no lesions, *** pelvic support, *** cystocele and *** rectocele noted.  No cervical motion tenderness.  Uterus is freely mobile and non-fixed.  No adnexal/parametria masses or tenderness noted.  Anus and perineum are without rashes or lesions.   ***  Skin: No rashes, bruises or suspicious lesions. Lymph: No cervical or inguinal adenopathy. Neurologic: Grossly intact, no focal deficits, moving all 4 extremities. Psychiatric: Normal mood and affect.    Laboratory Data:    Latest Ref Rng & Units 07/06/2021   10:55 AM 01/22/2021    1:15 PM 01/05/2021    5:13 PM  CMP  Glucose 65 - 99 mg/dL 82   94   90    BUN 7 - 25 mg/dL 9   11   16     Creatinine 0.50 - 0.97 mg/dL 0.80   0.97   0.96    Sodium 135 - 146 mmol/L 139   139   135    Potassium 3.5 - 5.3 mmol/L 4.1   3.8   5.0    Chloride 98 - 110 mmol/L 105   103   101    CO2 20 - 32 mmol/L 29   27   24     Calcium 8.6 - 10.2 mg/dL 8.7   10.1   9.7    Total Protein 6.1 - 8.1 g/dL 6.7     7.0   7.9   8.5    Total Bilirubin 0.2 - 1.2 mg/dL 0.4   0.5   0.6    Alkaline Phos 38 - 126 U/L  97   78    AST 10 - 30 U/L 13   13   21     ALT 6 - 29 U/L 9   8   10          Latest Ref Rng & Units 07/06/2021   10:55 AM 01/22/2021    1:15 PM 01/05/2021    5:13 PM  CBC  WBC 3.8 - 10.8 Thousand/uL 4.6  4.1   6.6    Hemoglobin 11.7  - 15.5 g/dL 10.9   13.6   12.3    Hematocrit 35.0 - 45.0 % 35.3   44.1   39.4    Platelets 140 - 400 Thousand/uL 294   327   337     Urinalysis *** I have reviewed the labs.   Pertinent Imaging: No recent imaging since last visit  Assessment & Plan:    1. Intermediate risk hematuria -work up in 2021 - NED -no reports of gross heme -UA ***    No follow-ups on file.  [facility] [address] [place], [place] [address] [phone]

## 2022-02-10 ENCOUNTER — Ambulatory Visit: Payer: Medicaid Other | Admitting: Urology

## 2022-02-10 DIAGNOSIS — R319 Hematuria, unspecified: Secondary | ICD-10-CM

## 2022-02-11 ENCOUNTER — Encounter: Payer: Self-pay | Admitting: Urology

## 2022-02-24 ENCOUNTER — Ambulatory Visit
Admission: EM | Admit: 2022-02-24 | Discharge: 2022-02-24 | Disposition: A | Discharge status: Home / Self Care | Payer: Medicaid Other | Attending: Urgent Care | Admitting: Urgent Care

## 2022-02-24 ENCOUNTER — Ambulatory Visit (INDEPENDENT_AMBULATORY_CARE_PROVIDER_SITE_OTHER): Payer: Medicaid Other

## 2022-02-24 DIAGNOSIS — Z87442 Personal history of urinary calculi: Secondary | ICD-10-CM

## 2022-02-24 DIAGNOSIS — R102 Pelvic and perineal pain: Secondary | ICD-10-CM | POA: Diagnosis present

## 2022-02-24 DIAGNOSIS — R109 Unspecified abdominal pain: Secondary | ICD-10-CM

## 2022-02-24 DIAGNOSIS — R319 Hematuria, unspecified: Secondary | ICD-10-CM | POA: Diagnosis not present

## 2022-02-24 DIAGNOSIS — N760 Acute vaginitis: Secondary | ICD-10-CM | POA: Diagnosis present

## 2022-02-24 LAB — POCT URINALYSIS DIP (MANUAL ENTRY)
Bilirubin, UA: NEGATIVE
Glucose, UA: NEGATIVE mg/dL
Ketones, POC UA: NEGATIVE mg/dL
Leukocytes, UA: NEGATIVE
Nitrite, UA: NEGATIVE
Protein Ur, POC: NEGATIVE mg/dL
Spec Grav, UA: >=1.03 — AB (ref 1.010–1.025)
Urobilinogen, UA: 0.2 E.U./dL
pH, UA: 5.5 (ref 5.0–8.0)

## 2022-02-24 LAB — POCT URINE PREGNANCY: Preg Test, Ur: NEGATIVE

## 2022-02-24 MED ORDER — FLUCONAZOLE 150 MG PO TABS: 150.0000 mg | ORAL_TABLET | Freq: Once | ORAL | 0 refills | Status: AC

## 2022-02-24 NOTE — ED Triage Notes (Signed)
Pt present flank and back pain, symptoms started a week ago. Pt states more frequency to go to the bath room.

## 2022-02-24 NOTE — ED Provider Notes (Signed)
[name]    CSN: [id number] Arrival date & time: 02/24/22  1024      History   Chief Complaint Chief Complaint  Patient presents with   Flank Pain    HPI Theresa Mooney is a 41 y.o. female.   41 year old female presents today due to concerns of flank pain, back pain, abnormal urination.  She states the back pain started roughly 1 week ago.  She does have a known history of kidney stones.  Her last kidney stone was greater than 2 years ago.  She states she is always passed them spontaneously and never required any intervention.  She states she also recently was having some pelvic pressure.  Admits to some odor and scant vaginal discharge.  States she felt like her cervix was "swollen".  Admits to not having bowel movements as frequently as normal, feels she needs a body cleanse.  Reports a known history of hematuria, follows with urologist, has had cystoscopy in the past, states " no one knows why I pee blood".  Denies fever, nausea, vomiting.  Had a tubal ligation, last normal menstrual period 3 weeks ago.   Flank Pain   Past Medical History:  Diagnosis Date   Asthma    Cannabis dependence in remission [facility])    Ectopic pregnancy    Elevated glucose    Herpes 2002   Infection    Migraine    Mixed hyperlipidemia    Nicotine dependence    Trichimoniasis 12-2010   Urinary calculi     Patient Active Problem List   Diagnosis Date Noted   Bacterial vaginitis 03/19/2021   Pain in both lower extremities 03/17/2021   Migraine without status migrainosus, not intractable 06/23/2020   h/o postpartum BTL  12/13/2011   S/P ectopic pregnancy 12/13/2011    Past Surgical History:  Procedure Laterality Date   DILATION AND CURETTAGE OF UTERUS     LAPAROSCOPY  11/27/2011   Procedure: LAPAROSCOPY OPERATIVE;  Surgeon: Catalina Antigua, MD;  Location: [facility];  Service: Gynecology;  Laterality: Bilateral;   SALPINGECTOMY     Bilateral for twin ectopic preg.    TUBAL  LIGATION  12-05-2010    OB History     Gravida  7   Para  5   Term      Preterm      AB  2   Living  5      SAB      IAB  1   Ectopic  1   Multiple      Live Births  5            Home Medications    Prior to Admission medications   Medication Sig Start Date End Date Taking? Authorizing Provider  fluconazole (DIFLUCAN) 150 MG tablet Take 1 tablet (150 mg total) by mouth once for 1 dose. 02/24/22 02/24/22 Yes Alessia Gonsalez L, PA  albuterol (VENTOLIN HFA) 108 (90 Base) MCG/ACT inhaler Inhale 2 puffs into the lungs every 6 (six) hours as needed for wheezing or shortness of breath. 08/06/21   Georganna Skeans, MD  Boric Acid [place] [address] 600 mg vaginally at bedtime. Use vaginally every night for two weeks then twice a week 10/26/21   Constant, Peggy, MD  Diclofenac Sodium CR 100 MG 24 hr tablet Take by mouth. 05/01/20   [provider]  omeprazole (PRILOSEC) 20 MG capsule Take 1 capsule (20 mg total) by mouth daily. 05/19/20   Robinson, [place] N, PA-C  Vitamin D, Ergocalciferol, (DRISDOL) 1.25 MG (50000 UNIT) CAPS capsule Take 1 capsule (50,000 Units total) by mouth every 7 (seven) days. 01/27/22   Georganna Skeans, MD  SUMAtriptan (IMITREX) 50 MG tablet Take 1 tablet (50 mg total) by mouth every 2 (two) hours as needed for migraine. May repeat in 2 hours if headache persists or recurs. Patient not taking: Reported on 07/02/2020 06/23/20 07/29/20  Mayers, Kasandra Knudsen, PA-C    Family History Family History  Problem Relation Age of Onset   Hypertension Mother    Asthma Mother    Hypertension Sister    Stroke Sister    Breast cancer Maternal Aunt    Diabetes Maternal Grandmother    Heart disease Maternal Grandmother    Hypertension Maternal Grandmother    Diabetes Maternal Grandfather    Heart disease Maternal Grandfather    Hypertension Maternal Grandfather    Hyperlipidemia Daughter    Healthy Son    Healthy Son    Healthy Son    Anesthesia problems Neg Hx      Social History Social History   Tobacco Use   Smoking status: Former    Types: Cigarettes   Smokeless tobacco: Never  Vaping Use   Vaping Use: Never used  Substance Use Topics   Alcohol use: Yes    Alcohol/week: 1.0 standard drink    Types: 1 Glasses of wine per week    Comment: social   Drug use: Not Currently    Types: Marijuana     Allergies   Hydrocodone and Penicillins   Review of Systems Review of Systems  Genitourinary:  Positive for flank pain.  As per HPI  Physical Exam Triage Vital Signs ED Triage Vitals  Enc Vitals Group     BP 02/24/22 1058 98/61     Pulse Rate 02/24/22 1058 72     Resp 02/24/22 1058 18     Temp 02/24/22 1058 98.6 F (37 C)     Temp Source 02/24/22 1058 Oral     SpO2 02/24/22 1058 98 %     Weight --      Height --      Head Circumference --      Peak Flow --      Pain Score 02/24/22 1059 6     Pain Loc --      Pain Edu? --      Excl. in GC? --    No data found.  Updated Vital Signs BP 98/61 (BP Location: Left Arm)   Pulse 72   Temp 98.6 F (37 C) (Oral)   Resp 18   SpO2 98%   Visual Acuity Right Eye Distance:   Left Eye Distance:   Bilateral Distance:    Right Eye Near:   Left Eye Near:    Bilateral Near:     Physical Exam Vitals and nursing note reviewed.  Constitutional:      General: She is not in acute distress.    Appearance: She is well-developed. She is obese. She is not ill-appearing, toxic-appearing or diaphoretic.  HENT:     Head: Normocephalic.  Cardiovascular:     Rate and Rhythm: Normal rate.     Heart sounds: No murmur heard. Pulmonary:     Effort: Pulmonary effort is normal. No respiratory distress.     Breath sounds: No wheezing.  Abdominal:     General: Abdomen is flat. Bowel sounds are normal. There is no distension. There are no signs of injury.  Palpations: Abdomen is soft. There is no shifting dullness, fluid wave, hepatomegaly, splenomegaly, mass or pulsatile mass.      Tenderness: There is no abdominal tenderness. There is no right CVA tenderness, left CVA tenderness, guarding or rebound. Negative signs include Murphy's sign, Rovsing's sign, McBurney's sign, psoas sign and obturator sign.     Hernia: No hernia is present. There is no hernia in the left inguinal area or right inguinal area.  Genitourinary:    General: Normal vulva.     Pubic Area: No rash or pubic lice.      Labia:        Right: No rash, tenderness, lesion or injury.        Left: No rash, tenderness, lesion or injury.      Urethra: No prolapse, urethral pain, urethral swelling or urethral lesion.     Vagina: No signs of injury and foreign body. Vaginal discharge (thick, white) present. No erythema, tenderness, bleeding, lesions or prolapsed vaginal walls.     Cervix: No cervical motion tenderness, discharge, friability, lesion, erythema, cervical bleeding or eversion.     Uterus: Normal. Not deviated, not enlarged, not fixed, not tender and no uterine prolapse.      Adnexa: Right adnexa normal and left adnexa normal.       Right: No mass, tenderness or fullness.         Left: No mass, tenderness or fullness.       Rectum: Normal.  Lymphadenopathy:     Cervical: No cervical adenopathy.     Lower Body: No right inguinal adenopathy. No left inguinal adenopathy.  Neurological:     Mental Status: She is alert.     UC Treatments / Results  Labs (all labs ordered are listed, but only abnormal results are displayed) Labs Reviewed  POCT URINALYSIS DIP (MANUAL ENTRY) - Abnormal; Notable for the following components:      Result Value   Spec Grav, UA >=1.030 (*)    Blood, UA moderate (*)    All other components within normal limits  URINE CULTURE  POCT URINE PREGNANCY  CERVICOVAGINAL ANCILLARY ONLY    EKG   Radiology DG Abd 1 View  Result Date: 02/24/2022 CLINICAL DATA:  Bilateral flank pain.  History of renal calculi. EXAM: ABDOMEN - 1 VIEW COMPARISON:  CT scan 05/01/2020 FINDINGS:  Moderate stool burden noted in the colon. No findings for small bowel obstruction or free air. No renal calculi are identified. No definite calcifications along the expected course of the ureters bilaterally. The bony structures are unremarkable. IMPRESSION: 1. Moderate stool burden. 2. No obvious renal or ureteral calculi. Electronically Signed   By: Rudie Meyer M.D.   On: 02/24/2022 12:03    Procedures Procedures (including critical care time)  Medications Ordered in UC Medications - No data to display  Initial Impression / Assessment and Plan / UC Course  I have reviewed the triage vital signs and the nursing notes.  Pertinent labs & imaging results that were available during my care of the patient were reviewed by me and considered in my medical decision making (see chart for details).     Pelvic pain - possibly related to vaginitis vs constipation. Recommended OTC miralax to improve Bms. Hematuria - per pt, chronic and fully worked up by urology History kidney stones - KUB does not show any stones at present time Vaginitis - start diflucan today. Aptima testing obtained.  Final Clinical Impressions(s) / UC Diagnoses   Final diagnoses:  Pelvic pain  Hematuria, unspecified type  History of kidney stones  Vaginitis and vulvovaginitis     Discharge Instructions      Your x-ray is negative for visible kidney stone.  There is a moderate stool burden, which means you are constipated. Constipation can cause pelvic discomfort and urinary symptoms.  Please start taking 1 scoop, 17 g, of MiraLAX daily until normal bowel movements achieved. Take the diflucan called in today in a single dose. We will call with the results of your urine culture and vaginal swab if any treatment is indicated.   ED Prescriptions     Medication Sig Dispense Auth. Provider   fluconazole (DIFLUCAN) 150 MG tablet Take 1 tablet (150 mg total) by mouth once for 1 dose. 1 tablet Nea Gittens L, [place]       PDMP not reviewed this encounter.   Maretta BeesCrain, Daren Doswell L, [place] 02/24/22 2109

## 2022-02-24 NOTE — Discharge Instructions (Signed)
Your x-ray is negative for visible kidney stone.  There is a moderate stool burden, which means you are constipated. Constipation can cause pelvic discomfort and urinary symptoms.  Please start taking 1 scoop, 17 g, of MiraLAX daily until normal bowel movements achieved. Take the diflucan called in today in a single dose. We will call with the results of your urine culture and vaginal swab if any treatment is indicated.

## 2022-02-25 LAB — CERVICOVAGINAL ANCILLARY ONLY
Bacterial Vaginitis (gardnerella): NEGATIVE
Candida Glabrata: NEGATIVE
Candida Vaginitis: NEGATIVE
Chlamydia: NEGATIVE
Comment: NEGATIVE
Comment: NEGATIVE
Comment: NEGATIVE
Comment: NEGATIVE
Comment: NEGATIVE
Comment: NORMAL
Neisseria Gonorrhea: NEGATIVE
Trichomonas: NEGATIVE

## 2022-02-25 LAB — URINE CULTURE: Culture: <10000 — AB

## 2022-03-02 ENCOUNTER — Encounter: Payer: Medicaid Other | Admitting: Family Medicine

## 2022-03-09 NOTE — Progress Notes (Unsigned)
03/10/22 10:09 AM   Theresa Mooney Theresa Mooney 13-Jul-1981 [id number]  Referring provider:  Georganna Skeans, MD [address] [place],  [place] [address]  Urological history: 1. Intermediate risk hematuria -former smoker -RUS, 01/2021 NED -CT renal stone study, 04/2020 - punctate stones -Negative cystoscopy with Dr. Lonna Cobb in 2021  -no reports of gross heme -UA 3-10 RBC's   HPI: Theresa Mooney is a 41 y.o.female who presents today for a 1 year annual.   She has been experiencing a feeling of dampness in her underwear on and off over the last two years.  She denies any frank incontinence.  She does not drink a lot of fluids.  She will drink up to two bottles of water daily.  She drinks other liquids on occasion.    She also noticed a strong ammonia smell in her underwear as well.  She wears sanitary napkins during her menstrual cycles.    She may be having incontinence during orgasms as she has the smell of urine and more vaginal discharge.    UA 3-10 RBC's, > 10 squames and moderate bacteria.   PVR 103 mL   PMH: Past Medical History:  Diagnosis Date   Asthma    Cannabis dependence in remission [facility])    Ectopic pregnancy    Elevated glucose    Herpes 2002   Infection    Migraine    Mixed hyperlipidemia    Nicotine dependence    Trichimoniasis 12-2010   Urinary calculi     Surgical History: Past Surgical History:  Procedure Laterality Date   DILATION AND CURETTAGE OF UTERUS     LAPAROSCOPY  11/27/2011   Procedure: LAPAROSCOPY OPERATIVE;  Surgeon: Catalina Antigua, MD;  Location: [facility];  Service: Gynecology;  Laterality: Bilateral;   SALPINGECTOMY     Bilateral for twin ectopic preg.    TUBAL LIGATION  12-05-2010    Home Medications:  Allergies as of 03/10/2022       Reactions   Hydrocodone Nausea Only   Dizziness   Penicillins    Did it involve swelling of the face/tongue/throat, SOB, or low BP? Y Did it involve sudden or severe rash/hives, skin peeling, or  any reaction on the inside of your mouth or nose? N Did you need to seek medical attention at a hospital or doctor's office? Y When did it last happen?  Over 1 month     If all above answers are "NO", may proceed with cephalosporin use.        Medication List        Accurate as of March 10, 2022 10:09 AM. If you have any questions, ask your nurse or doctor.          albuterol 108 (90 Base) MCG/ACT inhaler Commonly known as: VENTOLIN HFA Inhale 2 puffs into the lungs every 6 (six) hours as needed for wheezing or shortness of breath.   Boric Acid [place] 600 mg vaginally at bedtime. Use vaginally every night for two weeks then twice a week   Diclofenac Sodium CR 100 MG 24 hr tablet Take by mouth.   omeprazole 20 MG capsule Commonly known as: PRILOSEC Take 1 capsule (20 mg total) by mouth daily.   Vitamin D (Ergocalciferol) 1.25 MG (50000 UNIT) Caps capsule Commonly known as: DRISDOL Take 1 capsule (50,000 Units total) by mouth every 7 (seven) days.        Allergies:  Allergies  Allergen Reactions   Hydrocodone Nausea Only  Dizziness   Penicillins     Did it involve swelling of the face/tongue/throat, SOB, or low BP? Y Did it involve sudden or severe rash/hives, skin peeling, or any reaction on the inside of your mouth or nose? N Did you need to seek medical attention at a hospital or doctor's office? Y When did it last happen?  Over 1 month     If all above answers are "NO", may proceed with cephalosporin use.      Family History: Family History  Problem Relation Age of Onset   Hypertension Mother    Asthma Mother    Hypertension Sister    Stroke Sister    Breast cancer Maternal Aunt    Diabetes Maternal Grandmother    Heart disease Maternal Grandmother    Hypertension Maternal Grandmother    Diabetes Maternal Grandfather    Heart disease Maternal Grandfather    Hypertension Maternal Grandfather    Hyperlipidemia Daughter    Healthy Son     Healthy Son    Healthy Son    Anesthesia problems Neg Hx     Social History:  reports that she has quit smoking. Her smoking use included cigarettes. She has never used smokeless tobacco. She reports current alcohol use of about 1.0 standard drink of alcohol per week. She reports that she does not currently use drugs after having used the following drugs: Marijuana.   Physical Exam: BP (!) 149/90   Pulse 89   Ht 4\' 11"  (1.499 m)   Wt 215 lb (97.5 kg)   LMP 02/28/2022   BMI 43.42 kg/m   Constitutional:  Well nourished. Alert and oriented, No acute distress. HEENT: [place] AT, moist mucus membranes.  Trachea midline Cardiovascular: No clubbing, cyanosis, or edema. Respiratory: Normal respiratory effort, no increased work of breathing. Neurologic: Grossly intact, no focal deficits, moving all 4 extremities. Psychiatric: Normal mood and affect.    Laboratory Data:    Latest Ref Rng & Units 07/06/2021   10:55 AM 01/22/2021    1:15 PM 01/05/2021    5:13 PM  CMP  Glucose 65 - 99 mg/dL 82  94  90   BUN 7 - 25 mg/dL 9  11  16    Creatinine 0.50 - 0.97 mg/dL 03/07/2021   6.54   Sodium 135 - 146 mmol/L 139  139  135   Potassium 3.5 - 5.3 mmol/L 4.1  3.8  5.0   Chloride 98 - 110 mmol/L 105  103  101   CO2 20 - 32 mmol/L 29  27  24    Calcium 8.6 - 10.2 mg/dL 8.7  6.50  9.7   Total Protein 6.1 - 8.1 g/dL 6.1 - 8.1 g/dL 6.7    7.0  7.9  8.5   Total Bilirubin 0.2 - 1.2 mg/dL 0.4  0.5  0.6   Alkaline Phos 38 - 126 U/L  97  78   AST 10 - 30 U/L 13  13  21    ALT 6 - 29 U/L 9  8  10         Latest Ref Rng & Units 07/06/2021   10:55 AM 01/22/2021    1:15 PM 01/05/2021    5:13 PM  CBC  WBC 3.8 - 10.8 Thousand/uL 4.6  4.1  6.6   Hemoglobin 11.7 - 15.5 g/dL   09/05/2021   Hematocrit 35.0 - 45.0 % 35.3  44.1  39.4   Platelets 140 - 400 Thousand/uL 294  327  337  Urinalysis With micro heme, squames and bacteria.  Patient states that she finished her menstrual cycle last week, so the micro  he may be a product of contamination versus true microscopic hematuria. I have reviewed the labs.   Pertinent Imaging:  03/10/22 10:28  Scan Result 106   Assessment & Plan:    1. Intermediate risk hematuria -work up in 2021 - NED -no reports of gross heme -UA 3-10 RBC's -likely due to contamination versus true microscopic hematuria -We will obtain a cath specimen when she returns in 3 weeks for confirmation.  Explained to her that it is important that we need to continually monitor her microscopic hematuria as this is sometimes the only symptom of a genitourinary malignancy  2. Mixed incontinence -She is likely having some stress incontinence during intercourse as she has a urine smell and of liquidy discharge after having an orgasm which is likely urine -We will have a trial of Gemtesa 75 mg daily, #28 samples given -We will reassess when she returns in 3 weeks  Return in about 3 weeks (around 03/31/2022) for CATH UA, PVR and OAB questionnaire.  [facility] [address], [address] [place], [place] [id number] [phone]

## 2022-03-10 ENCOUNTER — Encounter: Payer: Self-pay | Admitting: Urology

## 2022-03-10 ENCOUNTER — Ambulatory Visit (INDEPENDENT_AMBULATORY_CARE_PROVIDER_SITE_OTHER): Payer: Medicaid Other | Admitting: Urology

## 2022-03-10 VITALS — BP 149/90 | HR 89 | Ht 59.0 in | Wt 215.0 lb

## 2022-03-10 DIAGNOSIS — N3946 Mixed incontinence: Secondary | ICD-10-CM

## 2022-03-10 DIAGNOSIS — R3129 Other microscopic hematuria: Secondary | ICD-10-CM | POA: Diagnosis not present

## 2022-03-10 LAB — URINALYSIS, COMPLETE
Bilirubin, UA: NEGATIVE
Glucose, UA: NEGATIVE
Ketones, UA: NEGATIVE
Leukocytes,UA: NEGATIVE
Nitrite, UA: NEGATIVE
Protein,UA: NEGATIVE
Specific Gravity, UA: 1.03 (ref 1.005–1.030)
Urobilinogen, Ur: 1 mg/dL (ref 0.2–1.0)
pH, UA: 6 (ref 5.0–7.5)

## 2022-03-10 LAB — MICROSCOPIC EXAMINATION: Epithelial Cells (non renal): >10 /hpf — AB (ref 0–10)

## 2022-03-10 LAB — BLADDER SCAN AMB NON-IMAGING: Scan Result: 106

## 2022-03-10 MED ORDER — GEMTESA 75 MG PO TABS: 75.0000 mg | ORAL_TABLET | Freq: Every day | ORAL | 0 refills | Status: DC

## 2022-03-10 NOTE — Patient Instructions (Signed)
Follow up here in 3 weeks.    

## 2022-03-19 ENCOUNTER — Telehealth: Payer: Self-pay | Admitting: Urology

## 2022-03-19 MED ORDER — TROSPIUM CHLORIDE ER 60 MG PO CP24: 60.0000 mg | ORAL_CAPSULE | Freq: Every day | ORAL | 2 refills | Status: DC

## 2022-03-22 ENCOUNTER — Emergency Department (HOSPITAL_BASED_OUTPATIENT_CLINIC_OR_DEPARTMENT_OTHER)
Admission: EM | Admit: 2022-03-22 | Discharge: 2022-03-22 | Disposition: A | Discharge status: Home / Self Care | Payer: Medicaid Other | Attending: Emergency Medicine | Admitting: Emergency Medicine

## 2022-03-22 ENCOUNTER — Other Ambulatory Visit (HOSPITAL_BASED_OUTPATIENT_CLINIC_OR_DEPARTMENT_OTHER): Payer: Self-pay

## 2022-03-22 ENCOUNTER — Encounter (HOSPITAL_BASED_OUTPATIENT_CLINIC_OR_DEPARTMENT_OTHER): Payer: Self-pay

## 2022-03-22 ENCOUNTER — Other Ambulatory Visit: Payer: Self-pay

## 2022-03-22 DIAGNOSIS — N3000 Acute cystitis without hematuria: Secondary | ICD-10-CM | POA: Insufficient documentation

## 2022-03-22 DIAGNOSIS — A64 Unspecified sexually transmitted disease: Secondary | ICD-10-CM

## 2022-03-22 DIAGNOSIS — N898 Other specified noninflammatory disorders of vagina: Secondary | ICD-10-CM | POA: Diagnosis not present

## 2022-03-22 DIAGNOSIS — R350 Frequency of micturition: Secondary | ICD-10-CM | POA: Diagnosis present

## 2022-03-22 LAB — WET PREP, GENITAL
Clue Cells Wet Prep HPF POC: NONE SEEN
Sperm: NONE SEEN
Trich, Wet Prep: NONE SEEN
WBC, Wet Prep HPF POC: <10 (ref <10)
Yeast Wet Prep HPF POC: NONE SEEN

## 2022-03-22 LAB — URINALYSIS, ROUTINE W REFLEX MICROSCOPIC
Bilirubin Urine: NEGATIVE
Glucose, UA: NEGATIVE mg/dL
Ketones, ur: NEGATIVE mg/dL
Nitrite: NEGATIVE
Protein, ur: 30 mg/dL — AB
Specific Gravity, Urine: 1.026 (ref 1.005–1.030)
pH: 5.5 (ref 5.0–8.0)

## 2022-03-22 LAB — PREGNANCY, URINE: Preg Test, Ur: NEGATIVE

## 2022-03-22 MED ORDER — DOXYCYCLINE HYCLATE 100 MG PO TABS
100.0000 mg | ORAL_TABLET | Freq: Once | ORAL | Status: AC
  Administered 2022-03-22: 100 mg via ORAL
  Filled 2022-03-22: qty 1

## 2022-03-22 MED ORDER — CEPHALEXIN 500 MG PO CAPS
500.0000 mg | ORAL_CAPSULE | Freq: Three times a day (TID) | ORAL | 0 refills | Status: AC
  Filled 2022-03-22: qty 21, 7d supply, fill #0

## 2022-03-22 MED ORDER — LIDOCAINE HCL (PF) 1 % IJ SOLN
1.0000 mL | Freq: Once | INTRAMUSCULAR | Status: AC
  Administered 2022-03-22: 1 mL
  Filled 2022-03-22: qty 5

## 2022-03-22 MED ORDER — DOXYCYCLINE HYCLATE 100 MG PO CAPS
100.0000 mg | ORAL_CAPSULE | Freq: Two times a day (BID) | ORAL | 0 refills | Status: AC
  Filled 2022-03-22: qty 20, 10d supply, fill #0

## 2022-03-22 MED ORDER — CEFTRIAXONE SODIUM 500 MG IJ SOLR
500.0000 mg | Freq: Once | INTRAMUSCULAR | Status: AC
  Administered 2022-03-22: 500 mg via INTRAMUSCULAR
  Filled 2022-03-22: qty 500

## 2022-03-22 NOTE — ED Provider Notes (Addendum)
[facility] EMERGENCY DEPT Provider Note   CSN: [id number] Arrival date & time: 03/22/22  [id number]     History  Chief Complaint  Patient presents with   Vaginal Discharge    Theresa Mooney is a 41 y.o. female.  Patient presenting for vaginal discharge, vaginal odor, vaginal itching, and frequent urination x 3-4 days.  Patient denies any abdominal pain, nausea, vomiting.  Is any vaginal bleeding or concerns for pregnancy.  Unprotected sex with her exclusive partner who only has been asking her questions about STIs and they are not she has having sex outside of the relationship.    The history is provided by the patient. No language interpreter was used.  Vaginal Discharge Associated symptoms: no abdominal pain, no dysuria, no fever and no vomiting        Home Medications Prior to Admission medications   Medication Sig Start Date End Date Taking? Authorizing Provider  cephALEXin (KEFLEX) 500 MG capsule Take 1 capsule (500 mg total) by mouth 3 (three) times daily for 7 days. 03/22/22 [id number] Yes Campbell Stall P, DO  doxycycline (VIBRAMYCIN) 100 MG capsule Take 1 capsule (100 mg total) by mouth 2 (two) times daily for 10 days. 03/22/22 [id number] Yes Campbell Stall P, DO  albuterol (VENTOLIN HFA) 108 (90 Base) MCG/ACT inhaler Inhale 2 puffs into the lungs every 6 (six) hours as needed for wheezing or shortness of breath. 08/06/21   Dorna Mai, MD  Boric Acid [place] [address] 600 mg vaginally at bedtime. Use vaginally every night for two weeks then twice a week 10/26/21   Constant, Peggy, MD  Diclofenac Sodium CR 100 MG 24 hr tablet Take by mouth. 05/01/20   [provider]  omeprazole (PRILOSEC) 20 MG capsule Take 1 capsule (20 mg total) by mouth daily. 05/19/20   Robinson, [place] N, PA-C  oxybutynin (DITROPAN) 5 MG tablet Take 1 tablet (5 mg total) by mouth 3 (three) times daily. 03/23/22   Zara Council A, PA-C  Vitamin D, Ergocalciferol, (DRISDOL) 1.25 MG (50000 UNIT) CAPS  capsule Take 1 capsule (50,000 Units total) by mouth every 7 (seven) days. 01/27/22   Dorna Mai, MD  SUMAtriptan (IMITREX) 50 MG tablet Take 1 tablet (50 mg total) by mouth every 2 (two) hours as needed for migraine. May repeat in 2 hours if headache persists or recurs. Patient not taking: Reported on 07/02/2020 06/23/20 07/29/20  Mayers, Cari S, PA-C      Allergies    Hydrocodone and Penicillins    Review of Systems   Review of Systems  Constitutional:  Negative for chills and fever.  HENT:  Negative for ear pain and sore throat.   Eyes:  Negative for pain and visual disturbance.  Respiratory:  Negative for cough and shortness of breath.   Cardiovascular:  Negative for chest pain and palpitations.  Gastrointestinal:  Negative for abdominal pain and vomiting.  Genitourinary:  Positive for frequency and vaginal discharge. Negative for dysuria and hematuria.  Musculoskeletal:  Negative for arthralgias and back pain.  Skin:  Negative for color change and rash.  Neurological:  Negative for seizures and syncope.  All other systems reviewed and are negative.   Physical Exam Updated Vital Signs BP 97/71   Pulse 83   Temp 98.8 F (37.1 C) (Oral)   Resp 14   Ht 4\' 11"  (1.499 m)   Wt 97.5 kg   LMP 02/28/2022   SpO2 100%   BMI 43.42 kg/m  Physical Exam Vitals and nursing note  reviewed.  Constitutional:      General: She is not in acute distress.    Appearance: She is well-developed.  HENT:     Head: Normocephalic and atraumatic.  Eyes:     Conjunctiva/sclera: Conjunctivae normal.  Cardiovascular:     Rate and Rhythm: Normal rate and regular rhythm.     Heart sounds: No murmur heard. Pulmonary:     Effort: Pulmonary effort is normal. No respiratory distress.     Breath sounds: Normal breath sounds.  Abdominal:     Palpations: Abdomen is soft.     Tenderness: There is no abdominal tenderness.  Musculoskeletal:        General: No swelling.     Cervical back: Neck supple.   Skin:    General: Skin is warm and dry.     Capillary Refill: Capillary refill takes less than 2 seconds.  Neurological:     Mental Status: She is alert.  Psychiatric:        Mood and Affect: Mood normal.     ED Results / Procedures / Treatments   Labs (all labs ordered are listed, but only abnormal results are displayed) Labs Reviewed  URINALYSIS, ROUTINE W REFLEX MICROSCOPIC - Abnormal; Notable for the following components:      Result Value   APPearance HAZY (*)    Hgb urine dipstick MODERATE (*)    Protein, ur 30 (*)    Leukocytes,Ua SMALL (*)    Bacteria, UA MANY (*)    Crystals PRESENT (*)    All other components within normal limits  WET PREP, GENITAL  PREGNANCY, URINE  GC/CHLAMYDIA PROBE AMP ([place]) NOT AT [facility]    EKG None  Radiology No results found.  Procedures Procedures    Medications Ordered in ED Medications  cefTRIAXone (ROCEPHIN) injection 500 mg (500 mg Intramuscular Given 03/22/22 1130)  lidocaine (PF) (XYLOCAINE) 1 % injection 1-2.1 mL (1 mL Other Given 03/22/22 1130)  doxycycline (VIBRA-TABS) tablet 100 mg (100 mg Oral Given 03/22/22 1128)    ED Course/ Medical Decision Making/ A&P                           Medical Decision Making Amount and/or Complexity of Data Reviewed Labs: ordered.  Risk Prescription drug management.   41 year old female presenting for STI check.  Patient is alert oriented x3, no acute distress, afebrile, stable vital signs.  Physical exam demonstrates soft nontender abdomen.  Pelvic exam demonstrates copious amounts of yellow discharge.  No cervical motion tenderness.  Mount demonstrates no bacterial vaginosis, candidiasis, or trichomonas.  Gonorrhea and Chlamydia.  Patient recommended to follow her MyChart for results in the next 24 hours.  Requesting prophylactic treatment at this time.  Given lidocaine and Rocephin shot with doxycycline emergency department.  Doxycycline sent to pharmacy in case chlamydia  results are positive.  Patient recommended for test of cure if any test come back positive prior to having sexual intercourse.  Urinary analysis demonstrates very mild urinary tract infection likely secondary to copious discharge.  Treat nonetheless with Keflex.  Patient in no distress and overall condition improved here in the ED. Detailed discussions were had with the patient regarding current findings, and need for close f/u with PCP or on call doctor. The patient has been instructed to return immediately if the symptoms worsen in any way for re-evaluation. Patient verbalized understanding and is in agreement with current care plan. All questions answered prior to discharge.  Final Clinical Impression(s) / ED Diagnoses Final diagnoses:  Acute cystitis without hematuria  Vaginal discharge    Rx / DC Orders ED Discharge Orders          Ordered    cephALEXin (KEFLEX) 500 MG capsule  3 times daily        03/22/22 1225    doxycycline (VIBRAMYCIN) 100 MG capsule  2 times daily        03/22/22 1225              Franne Forts, DO 03/25/22 0957    Franne Forts, DO 03/25/22 1000

## 2022-03-23 ENCOUNTER — Telehealth: Payer: Self-pay | Admitting: Family Medicine

## 2022-03-23 ENCOUNTER — Other Ambulatory Visit: Payer: Self-pay | Admitting: Urology

## 2022-03-23 DIAGNOSIS — N3946 Mixed incontinence: Secondary | ICD-10-CM

## 2022-03-23 LAB — GC/CHLAMYDIA PROBE AMP (~~LOC~~) NOT AT ARMC
Chlamydia: NEGATIVE
Comment: NEGATIVE
Comment: NORMAL
Neisseria Gonorrhea: NEGATIVE

## 2022-03-23 MED ORDER — OXYBUTYNIN CHLORIDE 5 MG PO TABS: 5.0000 mg | ORAL_TABLET | Freq: Three times a day (TID) | ORAL | 0 refills | Status: DC

## 2022-03-23 NOTE — Telephone Encounter (Signed)
Copied from CRM [id number]. Topic: General - Other >> Mar 23, 2022  3:49 PM Everette C wrote: Reason for CRM: The patient would like to speak with a member of staff when possible  The patient has additional questions related to their recent GC/Chlamydia labs   Please contact further

## 2022-03-24 NOTE — Telephone Encounter (Signed)
Pt aware and picked up rx

## 2022-03-25 ENCOUNTER — Encounter: Payer: Self-pay | Admitting: Family Medicine

## 2022-03-25 ENCOUNTER — Ambulatory Visit (INDEPENDENT_AMBULATORY_CARE_PROVIDER_SITE_OTHER): Payer: Medicaid Other | Admitting: Family Medicine

## 2022-03-25 ENCOUNTER — Other Ambulatory Visit (HOSPITAL_COMMUNITY)
Admission: RE | Admit: 2022-03-25 | Discharge: 2022-03-25 | Disposition: A | Discharge status: Home / Self Care | Payer: Medicaid Other | Source: Ambulatory Visit | Attending: Family Medicine | Admitting: Family Medicine

## 2022-03-25 VITALS — BP 111/79 | HR 76 | Temp 98.3°F | Resp 16 | Ht 59.02 in | Wt 209.0 lb

## 2022-03-25 DIAGNOSIS — Z01419 Encounter for gynecological examination (general) (routine) without abnormal findings: Secondary | ICD-10-CM | POA: Diagnosis not present

## 2022-03-25 DIAGNOSIS — Z0000 Encounter for general adult medical examination without abnormal findings: Secondary | ICD-10-CM

## 2022-03-25 DIAGNOSIS — Z130 Encounter for screening for diseases of the blood and blood-forming organs and certain disorders involving the immune mechanism: Secondary | ICD-10-CM

## 2022-03-25 DIAGNOSIS — Z13220 Encounter for screening for lipoid disorders: Secondary | ICD-10-CM

## 2022-03-25 NOTE — Addendum Note (Signed)
Addended by: Kieth Brightly on: 03/25/2022 01:42 PM   Modules accepted: Orders

## 2022-03-25 NOTE — ED Notes (Signed)
Pt called regarding her dc paperwork. Info forwarded to EDP A. Wallace Cullens

## 2022-03-25 NOTE — Progress Notes (Signed)
Established Patient Office Visit  Subjective    Patient ID: Theresa Mooney, female    DOB: [birth year]  Age: 41 y.o. MRN: [id number]  CC:  Chief Complaint  Patient presents with   Annual Exam    HPI Theresa Mooney presents for routine annual exam with pap. Patient denies acute complaints.    Outpatient Encounter Medications as of 03/25/2022  Medication Sig   albuterol (VENTOLIN HFA) 108 (90 Base) MCG/ACT inhaler Inhale 2 puffs into the lungs every 6 (six) hours as needed for wheezing or shortness of breath.   Boric Acid [address] 600 mg vaginally at bedtime. Use vaginally every night for two weeks then twice a week   cephALEXin (KEFLEX) 500 MG capsule Take 1 capsule (500 mg total) by mouth 3 (three) times daily for 7 days.   Diclofenac Sodium CR 100 MG 24 hr tablet Take by mouth.   doxycycline (VIBRAMYCIN) 100 MG capsule Take 1 capsule (100 mg total) by mouth 2 (two) times daily for 10 days.   omeprazole (PRILOSEC) 20 MG capsule Take 1 capsule (20 mg total) by mouth daily.   oxybutynin (DITROPAN) 5 MG tablet Take 1 tablet (5 mg total) by mouth 3 (three) times daily.   Vitamin D, Ergocalciferol, (DRISDOL) 1.25 MG (50000 UNIT) CAPS capsule Take 1 capsule (50,000 Units total) by mouth every 7 (seven) days.   [DISCONTINUED] SUMAtriptan (IMITREX) 50 MG tablet Take 1 tablet (50 mg total) by mouth every 2 (two) hours as needed for migraine. May repeat in 2 hours if headache persists or recurs. (Patient not taking: Reported on 07/02/2020)   No facility-administered encounter medications on file as of 03/25/2022.    Past Medical History:  Diagnosis Date   Asthma    Cannabis dependence in remission [facility])    Ectopic pregnancy    Elevated glucose    Herpes 2002   Infection    Migraine    Mixed hyperlipidemia    Nicotine dependence    Trichimoniasis 12-2010   Urinary calculi     Past Surgical History:  Procedure Laterality Date   DILATION AND CURETTAGE OF UTERUS     LAPAROSCOPY   11/27/2011   Procedure: LAPAROSCOPY OPERATIVE;  Surgeon: Mora Bellman, MD;  Location: [facility];  Service: Gynecology;  Laterality: Bilateral;   SALPINGECTOMY     Bilateral for twin ectopic preg.    TUBAL LIGATION  12-05-2010    Family History  Problem Relation Age of Onset   Hypertension Mother    Asthma Mother    Hypertension Sister    Stroke Sister    Breast cancer Maternal Aunt    Diabetes Maternal Grandmother    Heart disease Maternal Grandmother    Hypertension Maternal Grandmother    Diabetes Maternal Grandfather    Heart disease Maternal Grandfather    Hypertension Maternal Grandfather    Hyperlipidemia Daughter    Healthy Son    Healthy Son    Healthy Son    Anesthesia problems Neg Hx     Social History   Socioeconomic History   Marital status: Single    Spouse name: Not on file   Number of children: Not on file   Years of education: Not on file   Highest education level: Not on file  Occupational History   Not on file  Tobacco Use   Smoking status: Former    Types: Cigarettes    Passive exposure: Past   Smokeless tobacco: Never  Vaping Use   Vaping  Use: Never used  Substance and Sexual Activity   Alcohol use: Yes    Alcohol/week: 1.0 standard drink of alcohol    Types: 1 Glasses of wine per week    Comment: social   Drug use: Not Currently    Types: Marijuana   Sexual activity: Yes    Birth control/protection: Surgical  Other Topics Concern   Not on file  Social History Narrative   Not on file   Social Determinants of Health   Financial Resource Strain: Not on file  Food Insecurity: Not on file  Transportation Needs: Not on file  Physical Activity: Not on file  Stress: Not on file  Social Connections: Not on file  Intimate Partner Violence: Not on file    Review of Systems  Genitourinary:  Negative for dysuria.  All other systems reviewed and are negative.       Objective    BP 111/79   Pulse 76   Temp 98.3 F (36.8 C)   Resp  16   Ht 4' 11.02" (1.499 m)   Wt 209 lb (94.8 kg)   LMP 02/28/2022   SpO2 97%   BMI 42.19 kg/m   Physical Exam Vitals and nursing note reviewed.  Constitutional:      General: She is not in acute distress. HENT:     Head: Normocephalic and atraumatic.     Right Ear: Tympanic membrane, ear canal and external ear normal.     Left Ear: Tympanic membrane, ear canal and external ear normal.     Nose: Nose normal.     Mouth/Throat:     Mouth: Mucous membranes are moist.     Pharynx: Oropharynx is clear.  Eyes:     Conjunctiva/sclera: Conjunctivae normal.     Pupils: Pupils are equal, round, and reactive to light.  Neck:     Thyroid: No thyromegaly.  Cardiovascular:     Rate and Rhythm: Normal rate and regular rhythm.     Heart sounds: Normal heart sounds. No murmur heard. Pulmonary:     Effort: Pulmonary effort is normal. No respiratory distress.     Breath sounds: Normal breath sounds.  Abdominal:     General: There is no distension.     Palpations: Abdomen is soft. There is no mass.     Tenderness: There is no abdominal tenderness.     Hernia: There is no hernia in the left inguinal area or right inguinal area.  Genitourinary:    Exam position: Supine.     Labia:        Right: No lesion.        Left: No lesion.      Vagina: Normal.     Cervix: Normal.     Uterus: Normal.      Adnexa: Right adnexa normal.     Rectum: Normal.  Musculoskeletal:        General: Normal range of motion.     Cervical back: Normal range of motion and neck supple.  Skin:    General: Skin is warm and dry.  Neurological:     General: No focal deficit present.     Mental Status: She is alert and oriented to person, place, and time.  Psychiatric:        Mood and Affect: Mood normal.        Behavior: Behavior normal.         Assessment & Plan:   1. Annual physical exam  - CMP14+EGFR  2. Pap  smear, as part of routine gynecological examination   3. Screening for deficiency  anemia  - CBC with Differential  4. Screening for lipid disorders  - Lipid Panel  5. Screening for endocrine/metabolic/immunity disorders  - Hemoglobin A1c - TSH   Return in about 1 year (around 03/26/2023) for physical.   Becky Sax, MD

## 2022-03-25 NOTE — Progress Notes (Signed)
Pt presents for annual physical exam Has concerns about her previous lab results in regards to UTI and ED diagnosis her w/STD before test results came back to confirm

## 2022-03-26 LAB — CBC WITH DIFFERENTIAL/PLATELET
Basophils Absolute: 0 10*3/uL (ref 0.0–0.2)
Basos: 1 %
EOS (ABSOLUTE): 0 10*3/uL (ref 0.0–0.4)
Eos: 1 %
Hematocrit: 35.7 % (ref 34.0–46.6)
Hemoglobin: 11.2 g/dL (ref 11.1–15.9)
Immature Grans (Abs): 0 10*3/uL (ref 0.0–0.1)
Immature Granulocytes: 0 %
Lymphocytes Absolute: 2.1 10*3/uL (ref 0.7–3.1)
Lymphs: 48 %
MCH: 27.4 pg (ref 26.6–33.0)
MCHC: 31.4 g/dL — ABNORMAL LOW (ref 31.5–35.7)
MCV: 87 fL (ref 79–97)
Monocytes Absolute: 0.4 10*3/uL (ref 0.1–0.9)
Monocytes: 9 %
Neutrophils Absolute: 1.7 10*3/uL (ref 1.4–7.0)
Neutrophils: 41 %
Platelets: 309 10*3/uL (ref 150–450)
RBC: 4.09 x10E6/uL (ref 3.77–5.28)
RDW: 13.6 % (ref 11.7–15.4)
WBC: 4.2 10*3/uL (ref 3.4–10.8)

## 2022-03-26 LAB — CMP14+EGFR
ALT: 9 IU/L (ref 0–32)
AST: 14 IU/L (ref 0–40)
Albumin/Globulin Ratio: 1.5 (ref 1.2–2.2)
Albumin: 4.5 g/dL (ref 3.8–4.8)
Alkaline Phosphatase: 96 IU/L (ref 44–121)
BUN/Creatinine Ratio: 13 (ref 9–23)
BUN: 12 mg/dL (ref 6–20)
Bilirubin Total: 0.3 mg/dL (ref 0.0–1.2)
CO2: 21 mmol/L (ref 20–29)
Calcium: 9.1 mg/dL (ref 8.7–10.2)
Chloride: 102 mmol/L (ref 96–106)
Creatinine, Ser: 0.93 mg/dL (ref 0.57–1.00)
Globulin, Total: 3 g/dL (ref 1.5–4.5)
Glucose: 81 mg/dL (ref 70–99)
Potassium: 4.1 mmol/L (ref 3.5–5.2)
Sodium: 138 mmol/L (ref 134–144)
Total Protein: 7.5 g/dL (ref 6.0–8.5)
eGFR: 81 mL/min/{1.73_m2} (ref >59)

## 2022-03-26 LAB — HEMOGLOBIN A1C
Est. average glucose Bld gHb Est-mCnc: 100 mg/dL
Hgb A1c MFr Bld: 5.1 % (ref 4.8–5.6)

## 2022-03-26 LAB — CERVICOVAGINAL ANCILLARY ONLY
Bacterial Vaginitis (gardnerella): NEGATIVE
Candida Glabrata: NEGATIVE
Candida Vaginitis: NEGATIVE
Chlamydia: NEGATIVE
Comment: NEGATIVE
Comment: NEGATIVE
Comment: NEGATIVE
Comment: NEGATIVE
Comment: NEGATIVE
Comment: NORMAL
Neisseria Gonorrhea: NEGATIVE
Trichomonas: NEGATIVE

## 2022-03-26 LAB — LIPID PANEL
Chol/HDL Ratio: 2.6 ratio (ref 0.0–4.4)
Cholesterol, Total: 184 mg/dL (ref 100–199)
HDL: 72 mg/dL (ref >39)
LDL Chol Calc (NIH): 99 mg/dL (ref 0–99)
Triglycerides: 69 mg/dL (ref 0–149)
VLDL Cholesterol Cal: 13 mg/dL (ref 5–40)

## 2022-03-26 LAB — TSH: TSH: 2.4 u[IU]/mL (ref 0.450–4.500)

## 2022-03-29 NOTE — Progress Notes (Deleted)
03/29/22 12:16 PM   Theresa Mooney 04-06-1981 [id number]  Referring provider:  Georganna Skeans, MD [address] [place],  [place] [address]  Urological history: 1. Intermediate risk hematuria -former smoker -RUS, 01/2021 NED -CT renal stone study, 04/2020 - punctate stones -Negative cystoscopy with Dr. Lonna Cobb in 2021  -no reports of gross heme -UA***  HPI: Theresa Mooney is a 41 y.o.female who presents today for a 1 year annual.   At her visit on 03/10/2022, she was experiencing a feeling of dampness in her underwear on and off over the last two years.  She denies any frank incontinence.  She does not drink a lot of fluids.  She will drink up to two bottles of water daily.  She drinks other liquids on occasion.  She also noticed a strong ammonia smell in her underwear as well.  She wears sanitary napkins during her menstrual cycles.  She may be having incontinence during orgasms as she has the smell of urine and more vaginal discharge.  UA 3-10 RBC's, > 10 squames and moderate bacteria.  PVR 103 mL.  She was given samples of Gemtesa.  She called the office a few days later stating that the Leslye Peer was giving her adverse side effects (headache, migraine, and cold like symptoms) and has discontinued taking the medication.  Her insurance had limited coverage, so oxybutynin IR 5 mg TID was sent to pharmacy.   She was seen on 03/22/20[facility] at [facility] ED for vaginal discharge.  STI testing was negative.      PMH: Past Medical History:  Diagnosis Date   Asthma    Cannabis dependence in remission [facility])    Ectopic pregnancy    Elevated glucose    Herpes 2002   Infection    Migraine    Mixed hyperlipidemia    Nicotine dependence    Trichimoniasis 12-2010   Urinary calculi     Surgical History: Past Surgical History:  Procedure Laterality Date   DILATION AND CURETTAGE OF UTERUS     LAPAROSCOPY  11/27/2011   Procedure: LAPAROSCOPY OPERATIVE;  Surgeon: Catalina Antigua, MD;  Location: [facility];  Service: Gynecology;  Laterality: Bilateral;   SALPINGECTOMY     Bilateral for twin ectopic preg.    TUBAL LIGATION  12-05-2010    Home Medications:  Allergies as of 03/31/2022       Reactions   Hydrocodone Nausea Only   Dizziness   Penicillins    Did it involve swelling of the face/tongue/throat, SOB, or low BP? Y Did it involve sudden or severe rash/hives, skin peeling, or any reaction on the inside of your mouth or nose? N Did you need to seek medical attention at a hospital or doctor's office? Y When did it last happen?  Over 1 month     If all above answers are "NO", may proceed with cephalosporin use.        Medication List        Accurate as of March 29, 2022 12:16 PM. If you have any questions, ask your nurse or doctor.          albuterol 108 (90 Base) MCG/ACT inhaler Commonly known as: VENTOLIN HFA Inhale 2 puffs into the lungs every 6 (six) hours as needed for wheezing or shortness of breath.   Boric Acid [place] [address] 600 mg vaginally at bedtime. Use vaginally every night for two weeks then twice a week   Diclofenac Sodium CR 100 MG 24 hr tablet Take  by mouth.   doxycycline 100 MG capsule Commonly known as: VIBRAMYCIN Take 1 capsule (100 mg total) by mouth 2 (two) times daily for 10 days.   omeprazole 20 MG capsule Commonly known as: PRILOSEC Take 1 capsule (20 mg total) by mouth daily.   oxybutynin 5 MG tablet Commonly known as: DITROPAN Take 1 tablet (5 mg total) by mouth 3 (three) times daily.   Vitamin D (Ergocalciferol) 1.25 MG (50000 UNIT) Caps capsule Commonly known as: DRISDOL Take 1 capsule (50,000 Units total) by mouth every 7 (seven) days.        Allergies:  Allergies  Allergen Reactions   Hydrocodone Nausea Only    Dizziness   Penicillins     Did it involve swelling of the face/tongue/throat, SOB, or low BP? Y Did it involve sudden or severe rash/hives, skin peeling, or any reaction on the inside of  your mouth or nose? N Did you need to seek medical attention at a hospital or doctor's office? Y When did it last happen?  Over 1 month     If all above answers are "NO", may proceed with cephalosporin use.      Family History: Family History  Problem Relation Age of Onset   Hypertension Mother    Asthma Mother    Hypertension Sister    Stroke Sister    Breast cancer Maternal Aunt    Diabetes Maternal Grandmother    Heart disease Maternal Grandmother    Hypertension Maternal Grandmother    Diabetes Maternal Grandfather    Heart disease Maternal Grandfather    Hypertension Maternal Grandfather    Hyperlipidemia Daughter    Healthy Son    Healthy Son    Healthy Son    Anesthesia problems Neg Hx     Social History:  reports that she has quit smoking. Her smoking use included cigarettes. She has been exposed to tobacco smoke. She has never used smokeless tobacco. She reports current alcohol use of about 1.0 standard drink of alcohol per week. She reports that she does not currently use drugs after having used the following drugs: Marijuana.   Physical Exam: LMP 02/28/2022   Constitutional:  Well nourished. Alert and oriented, No acute distress. HEENT: [place] AT, moist mucus membranes.  Trachea midline, no masses. Cardiovascular: No clubbing, cyanosis, or edema. Respiratory: Normal respiratory effort, no increased work of breathing. GI: Abdomen is soft, non tender, non distended, no abdominal masses. Liver and spleen not palpable.  No hernias appreciated.  Stool sample for occult testing is not indicated.   GU: No CVA tenderness.  No bladder fullness or masses.  *** external genitalia, *** pubic hair distribution, no lesions.  Normal urethral meatus, no lesions, no prolapse, no discharge.   No urethral masses, tenderness and/or tenderness. No bladder fullness, tenderness or masses. *** vagina mucosa, *** estrogen effect, no discharge, no lesions, *** pelvic support, *** cystocele and  *** rectocele noted.  No cervical motion tenderness.  Uterus is freely mobile and non-fixed.  No adnexal/parametria masses or tenderness noted.  Anus and perineum are without rashes or lesions.   ***  Skin: No rashes, bruises or suspicious lesions. Lymph: No cervical or inguinal adenopathy. Neurologic: Grossly intact, no focal deficits, moving all 4 extremities. Psychiatric: Normal mood and affect.      Laboratory Data:    Latest Ref Rng & Units 03/25/2022    1:41 PM 07/06/2021   10:55 AM 01/22/2021    1:15 PM  CMP  Glucose 70 -  99 mg/dL 81  82  94   BUN 6 - 20 mg/dL 12  9  11    Creatinine 0.57 - 1.00 mg/dL  8.78  6.76   Sodium 134 - 144 mmol/L 138  139  139   Potassium 3.5 - 5.2 mmol/L 4.1  4.1  3.8   Chloride 96 - 106 mmol/L 102  105  103   CO2 20 - 29 mmol/L 21  29  27    Calcium 8.7 - 10.2 mg/dL 9.1  8.7  7.20   Total Protein 6.0 - 8.5 g/dL 7.5  6.7    7.0  7.9   Total Bilirubin 0.0 - 1.2 mg/dL 0.3  0.4  0.5   Alkaline Phos 44 - 121 IU/L 96   97   AST 0 - 40 IU/L 14  13  13    ALT 0 - 32 IU/L 9  9  8         Latest Ref Rng & Units 03/25/2022    1:41 PM 07/06/2021   10:55 AM 01/22/2021    1:15 PM  CBC  WBC 3.4 - 10.8 x10E3/uL 4.2  4.6  4.1   Hemoglobin 11.1 - 15.9 g/dL  03/27/2022  09/05/2021   Hematocrit 34.0 - 46.6 % 35.7  35.3  44.1   Platelets 150 - 450 x10E3/uL 309  294  327    Urinalysis *** I have reviewed the labs.   Pertinent Imaging:  Assessment & Plan:    1. Intermediate risk hematuria -work up in 2021 - NED -no reports of gross heme -***  2. Mixed incontinence -***  No follow-ups on file.  [facility] [address], [address] [place], [address] [facility] [phone]

## 2022-03-31 ENCOUNTER — Ambulatory Visit: Payer: Medicaid Other | Admitting: Urology

## 2022-03-31 DIAGNOSIS — R3129 Other microscopic hematuria: Secondary | ICD-10-CM

## 2022-03-31 DIAGNOSIS — N3946 Mixed incontinence: Secondary | ICD-10-CM

## 2022-04-02 LAB — CYTOLOGY - PAP
Comment: NEGATIVE
Diagnosis: UNDETERMINED — AB
High risk HPV: NEGATIVE

## 2022-04-15 NOTE — Telephone Encounter (Signed)
Patient was called and explain what her lab results were. Copied from CRM [id number]. Topic: General - Inquiry >> Apr 15, 2022  2:18 PM Runell Gess P wrote: Reason for CRM: Pt called the office back to discuss pap results.    CB#  [phone]

## 2022-04-20 NOTE — Progress Notes (Signed)
Error

## 2022-04-21 ENCOUNTER — Ambulatory Visit: Payer: Medicaid Other | Admitting: Urology

## 2022-04-21 ENCOUNTER — Encounter: Payer: Self-pay | Admitting: Urology

## 2022-04-21 VITALS — BP 126/80 | HR 79 | Ht 59.0 in | Wt 217.0 lb

## 2022-04-21 DIAGNOSIS — R319 Hematuria, unspecified: Secondary | ICD-10-CM

## 2022-04-21 DIAGNOSIS — N3946 Mixed incontinence: Secondary | ICD-10-CM | POA: Diagnosis not present

## 2022-04-21 DIAGNOSIS — R3129 Other microscopic hematuria: Secondary | ICD-10-CM

## 2022-04-21 LAB — MICROSCOPIC EXAMINATION
Bacteria, UA: NONE SEEN
WBC, UA: NONE SEEN /hpf (ref 0–5)

## 2022-04-21 LAB — URINALYSIS, COMPLETE
Bilirubin, UA: NEGATIVE
Glucose, UA: NEGATIVE
Ketones, UA: NEGATIVE
Leukocytes,UA: NEGATIVE
Nitrite, UA: NEGATIVE
Protein,UA: NEGATIVE
Specific Gravity, UA: 1.02 (ref 1.005–1.030)
Urobilinogen, Ur: 0.2 mg/dL (ref 0.2–1.0)
pH, UA: 6 (ref 5.0–7.5)

## 2022-04-21 NOTE — Progress Notes (Signed)
In and Out Catheterization  Patient is present today for a I & O catheterization due to microscopic hematuria. Patient was cleaned and prepped in a sterile fashion with betadine . A 14FR cath was inserted no complications were noted , of urine return was noted, urine was pale yellow in color. A clean urine sample was collected for urinalysis. Bladder was drained  And catheter was removed with out difficulty.    Performed by: Franchot Erichsen CMA

## 2022-04-21 NOTE — Progress Notes (Signed)
04/21/22 10:38 AM   Theresa Mooney Theresa Mooney 1980/09/29 [id number]  Referring provider:  Georganna Skeans, MD [address] [place],  [place] [address]  Urological history: 1. Intermediate risk hematuria -former smoker -RUS, 01/2021 NED -CT renal stone study, 04/2020 - punctate stones -Negative cystoscopy with Dr. Lonna Cobb in 2021  -no reports of gross heme -UA 3-10 RBCs  HPI: Theresa Mooney is a 41 y.o.female who presents today for follow up for recheck on UA, OAB and PVR.   At her visit on 03/10/2022, she was experiencing a feeling of dampness in her underwear on and off over the last two years.  She denies any frank incontinence.  She does not drink a lot of fluids.  She will drink up to two bottles of water daily.  She drinks other liquids on occasion.  She also noticed a strong ammonia smell in her underwear as well.  She wears sanitary napkins during her menstrual cycles.  She may be having incontinence during orgasms as she has the smell of urine and more vaginal discharge.  UA 3-10 RBC's, > 10 squames and moderate bacteria.  PVR 103 mL.  She was given samples of Gemtesa.  She called the office a few days later stating that the Leslye Peer was giving her adverse side effects (headache, migraine, and cold like symptoms) and has discontinued taking the medication.  Her insurance had limited coverage, so oxybutynin IR 5 mg TID was sent to pharmacy.   She was seen on 03/22/2022 at [facility] ED for vaginal discharge.  STI testing was negative.    She reports 1-7 daytime urination 3 or more nighttime urination, mild urge to urinate. She does not have incontinence,  she limits fluid intake in certain situations and she toilet maps in certain situations   She reports that gemtesa samples helped and her leakage improved. She intermittently wakes up to void throughout the night it is worse when its close to her period.   Patient denies any modifying or aggravating factors.  Patient denies  any gross hematuria, dysuria or suprapubic/flank pain.  Patient denies any fevers, chills, nausea or vomiting.   CATH UA 3-10 RBC's  PMH: Past Medical History:  Diagnosis Date   Asthma    Cannabis dependence in remission [facility])    Ectopic pregnancy    Elevated glucose    Herpes 2002   Infection    Migraine    Mixed hyperlipidemia    Nicotine dependence    Trichimoniasis 12-2010   Urinary calculi     Surgical History: Past Surgical History:  Procedure Laterality Date   DILATION AND CURETTAGE OF UTERUS     LAPAROSCOPY  11/27/2011   Procedure: LAPAROSCOPY OPERATIVE;  Surgeon: Catalina Antigua, MD;  Location: [facility];  Service: Gynecology;  Laterality: Bilateral;   SALPINGECTOMY     Bilateral for twin ectopic preg.    TUBAL LIGATION  12-05-2010    Home Medications:  Allergies as of 04/21/2022       Reactions   Hydrocodone Nausea Only   Dizziness   Penicillins    Did it involve swelling of the face/tongue/throat, SOB, or low BP? Y Did it involve sudden or severe rash/hives, skin peeling, or any reaction on the inside of your mouth or nose? N Did you need to seek medical attention at a hospital or doctor's office? Y When did it last happen?  Over 1 month     If all above answers are "NO", may proceed with cephalosporin use.  Medication List        Accurate as of April 21, 2022 10:38 AM. If you have any questions, ask your nurse or doctor.          albuterol 108 (90 Base) MCG/ACT inhaler Commonly known as: VENTOLIN HFA Inhale 2 puffs into the lungs every 6 (six) hours as needed for wheezing or shortness of breath.   Boric Acid [place] 600 mg vaginally at bedtime. Use vaginally every night for two weeks then twice a week   Diclofenac Sodium CR 100 MG 24 hr tablet Take by mouth.   omeprazole 20 MG capsule Commonly known as: PRILOSEC Take 1 capsule (20 mg total) by mouth daily.   oxybutynin 5 MG tablet Commonly known as: DITROPAN Take 1 tablet (5 mg  total) by mouth 3 (three) times daily.   Vitamin D (Ergocalciferol) 1.25 MG (50000 UNIT) Caps capsule Commonly known as: DRISDOL Take 1 capsule (50,000 Units total) by mouth every 7 (seven) days.        Allergies:  Allergies  Allergen Reactions   Hydrocodone Nausea Only    Dizziness   Penicillins     Did it involve swelling of the face/tongue/throat, SOB, or low BP? Y Did it involve sudden or severe rash/hives, skin peeling, or any reaction on the inside of your mouth or nose? N Did you need to seek medical attention at a hospital or doctor's office? Y When did it last happen?  Over 1 month     If all above answers are "NO", may proceed with cephalosporin use.      Family History: Family History  Problem Relation Age of Onset   Hypertension Mother    Asthma Mother    Hypertension Sister    Stroke Sister    Breast cancer Maternal Aunt    Diabetes Maternal Grandmother    Heart disease Maternal Grandmother    Hypertension Maternal Grandmother    Diabetes Maternal Grandfather    Heart disease Maternal Grandfather    Hypertension Maternal Grandfather    Hyperlipidemia Daughter    Healthy Son    Healthy Son    Healthy Son    Anesthesia problems Neg Hx     Social History:  reports that she has quit smoking. Her smoking use included cigarettes. She has been exposed to tobacco smoke. She has never used smokeless tobacco. She reports current alcohol use of about 1.0 standard drink of alcohol per week. She reports that she does not currently use drugs after having used the following drugs: Marijuana.   Physical Exam: BP 126/80   Pulse 79   Ht 4\' 11"  (1.499 m)   Wt 217 lb (98.4 kg)   BMI 43.83 kg/m   Constitutional:  Well nourished. Alert and oriented, No acute distress. HEENT: [place] AT, moist mucus membranes.  Trachea midline, no masses. Cardiovascular: No clubbing, cyanosis, or edema. Respiratory: Normal respiratory effort, no increased work of breathing. Neurologic:  Grossly intact, no focal deficits, moving all 4 extremities. Psychiatric: Normal mood and affect.      Laboratory Data:    Latest Ref Rng & Units 03/25/2022    1:41 PM 07/06/2021   10:55 AM 01/22/2021    1:15 PM  CMP  Glucose 70 - 99 mg/dL 81  82  94   BUN 6 - 20 mg/dL 12  9  11    Creatinine 0.57 - 1.00 mg/dL 01/24/2021   4.01   Sodium 134 - 144 mmol/L 138  139  139  Potassium 3.5 - 5.2 mmol/L 4.1  4.1  3.8   Chloride 96 - 106 mmol/L 102  105  103   CO2 20 - 29 mmol/L 21  29  27    Calcium 8.7 - 10.2 mg/dL 9.1  8.7    Total Protein 6.0 - 8.5 g/dL 7.5  6.7    7.0  7.9   Total Bilirubin 0.0 - 1.2 mg/dL 0.3  0.4  0.5   Alkaline Phos 44 - 121 IU/L 96   97   AST 0 - 40 IU/L 14  13  13    ALT 0 - 32 IU/L 9  9  8         Latest Ref Rng & Units 03/25/2022    1:41 PM 07/06/2021   10:55 AM 01/22/2021    1:15 PM  CBC  WBC 3.4 - 10.8 x10E3/uL 4.2  4.6  4.1   Hemoglobin 11.1 - 15.9 g/dL 03/27/2022  09/05/2021  01/24/2021   Hematocrit 34.0 - 46.6 % 35.7  35.3  44.1   Platelets 150 - 450 x10E3/uL 309  294  327    Urinalysis 3-10 RBCs  I have reviewed the labs.    Assessment & Plan:    1. Intermediate risk hematuria -work up in 2021 - NED - no reports of gross heme - UA positive for microhematuria; 3-10 RBCs - We discussed the differential diagnosis for microscopic hematuria including nephrolithiasis, renal or upper tract tumors, bladder stones, UTIs, or bladder tumors as well as undetermined etiologies. - Will further evaluate with CTU to rule out any underlying etiologies per AUA guidelines   2. Mixed incontinence -improved on oxybutynin IR 5 TID  Return in 3 weeks (05/12/2022) for CTU review al   40.9, [facility] [address], [address] [place], [facility] [address] [phone]

## 2022-04-24 IMAGING — CT CT RENAL STONE PROTOCOL
2 of 4 series · 16 of 46 positions shown, 18 images · non-contrast
Comparison: CT 11/27/2017

CLINICAL DATA: Bilateral flank pain

EXAM:
CT ABDOMEN AND PELVIS WITHOUT CONTRAST
TECHNIQUE: Multidetector CT imaging of the abdomen and pelvis was performed
following the standard protocol without IV contrast.

[Series 2: axial st · axial · 0.59mm/px · z∈[+1113,+1468]mm · 13 of 81 slices shown, 15 images]
[im 5/81  soft-tissue]
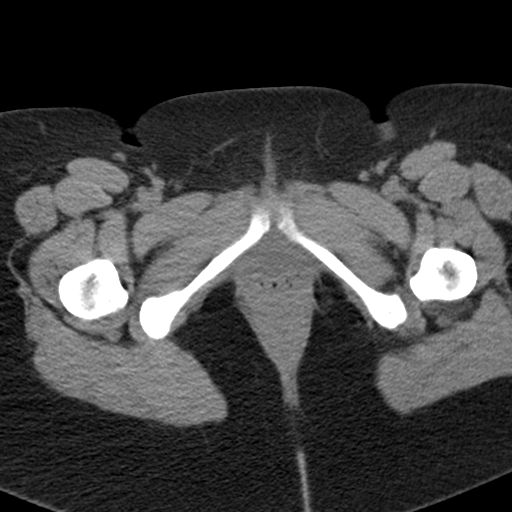
[im 5/81  bone]
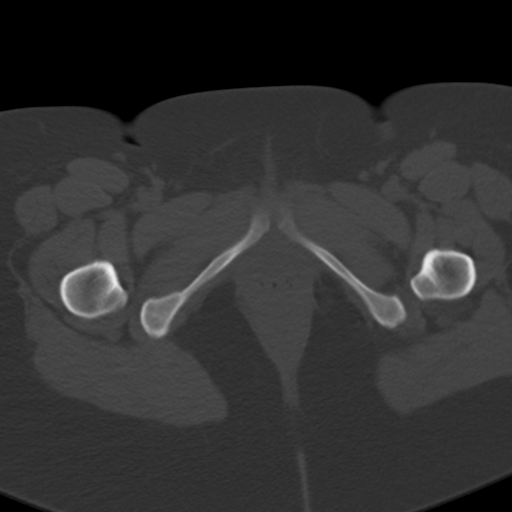
[im 13/81  soft-tissue]
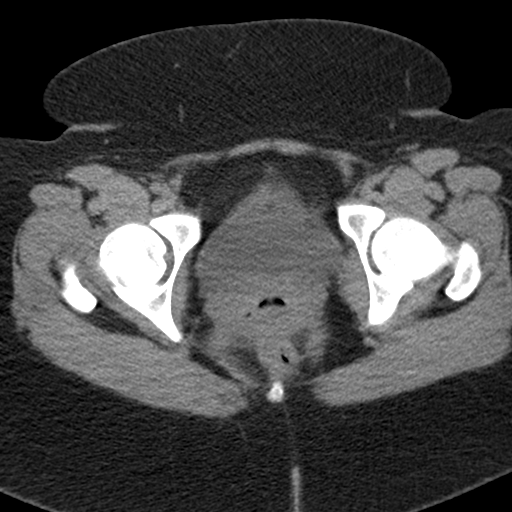
[im 17/81  soft-tissue]
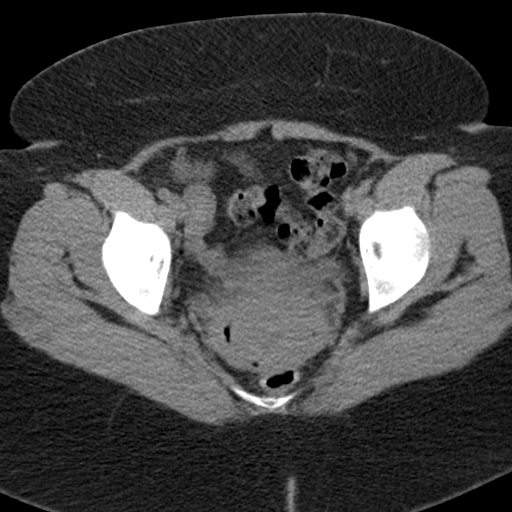
[im 22/81  soft-tissue]
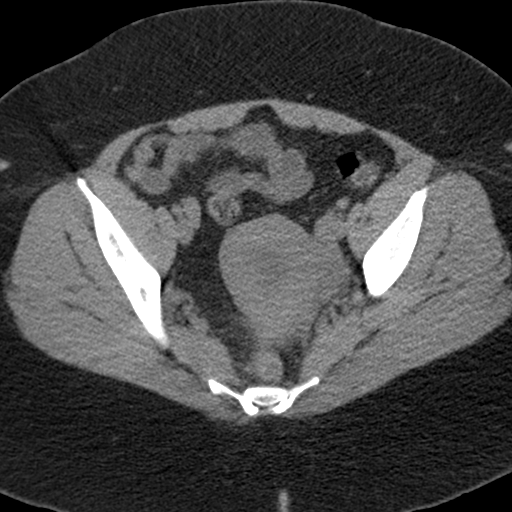
[im 30/81  soft-tissue]
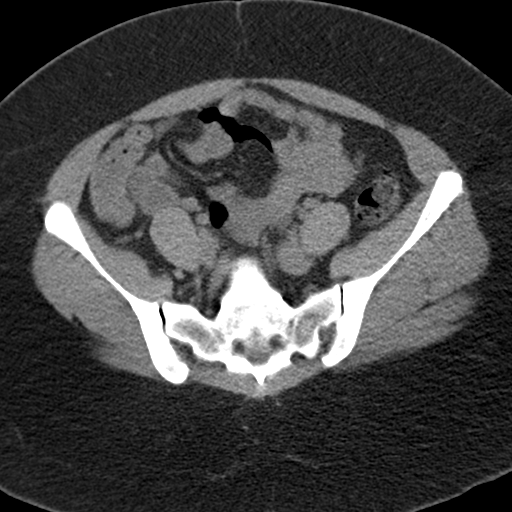
[im 34/81  soft-tissue]
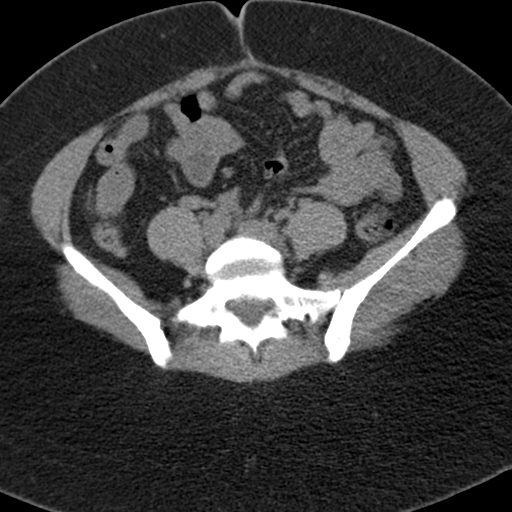
[im 43/81  soft-tissue]
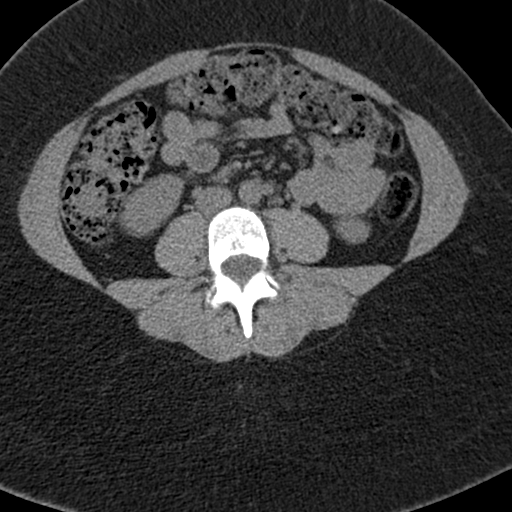
[im 47/81  soft-tissue]
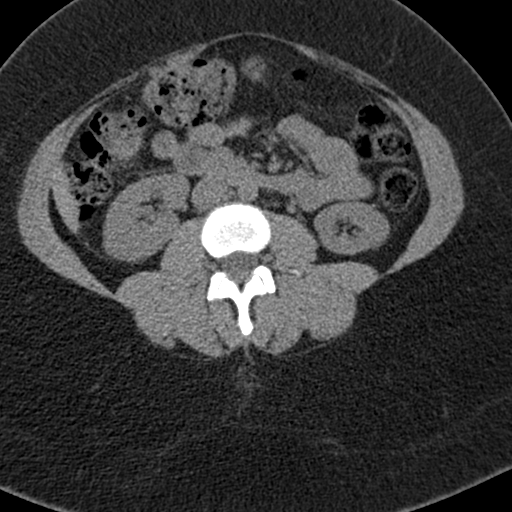
[im 51/81  soft-tissue]
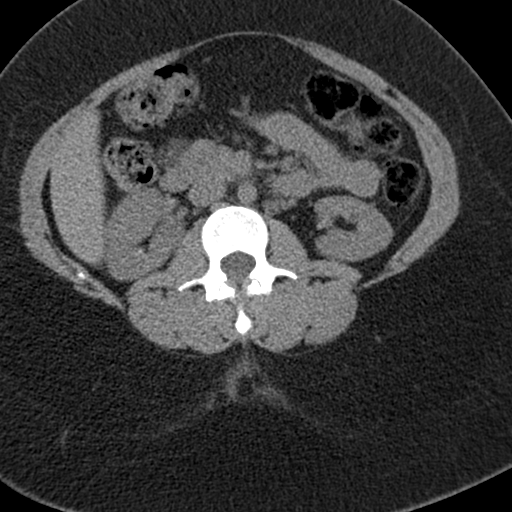
[im 51/81  bone]
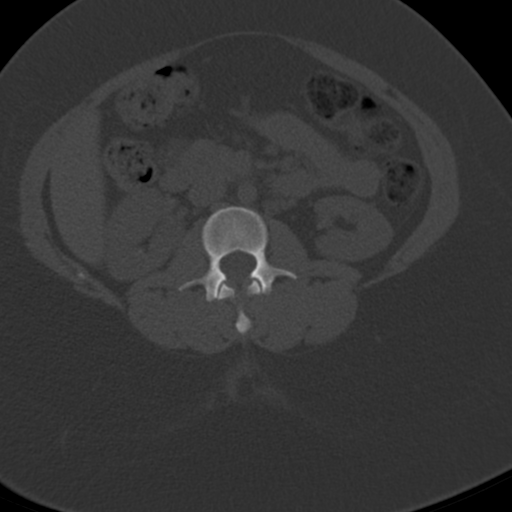
[im 59/81  soft-tissue]
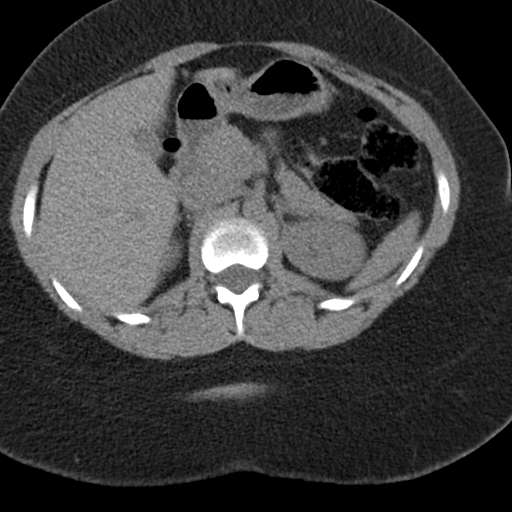
[im 64/81  soft-tissue]
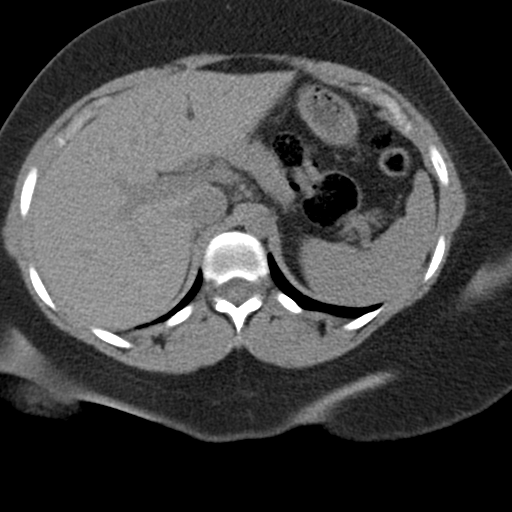
[im 68/81  soft-tissue]
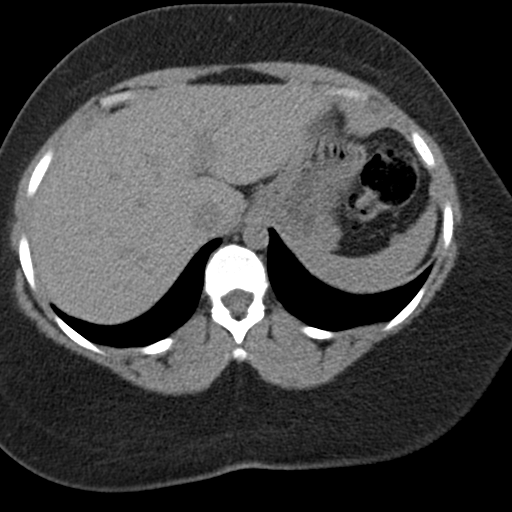
[im 76/81  soft-tissue]
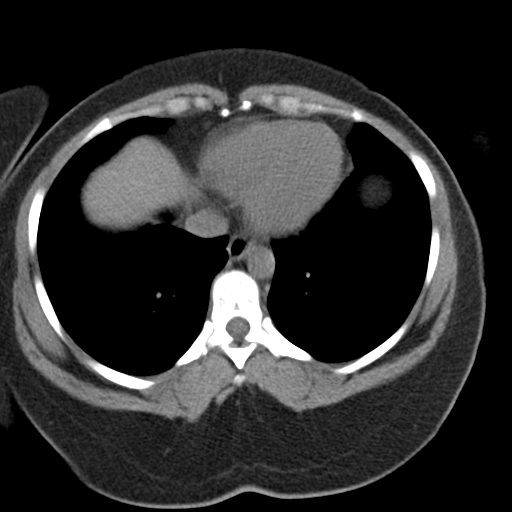

[Series 4: coronal · coronal · 0.63mm/px · 3 of 151 slices shown]
[im 51/151  soft-tissue]
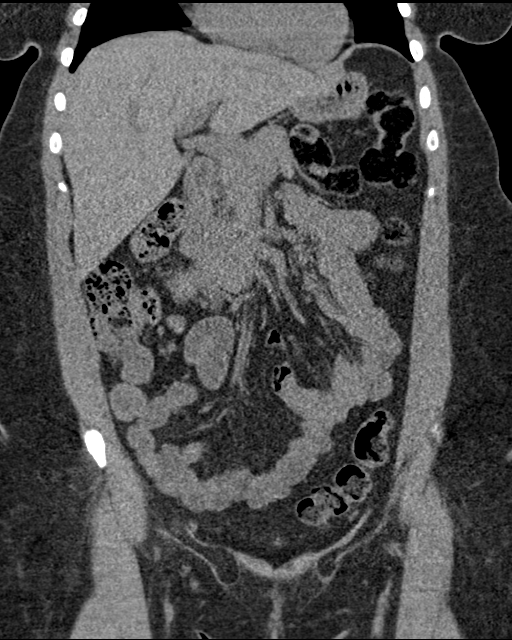
[im 67/151  soft-tissue]
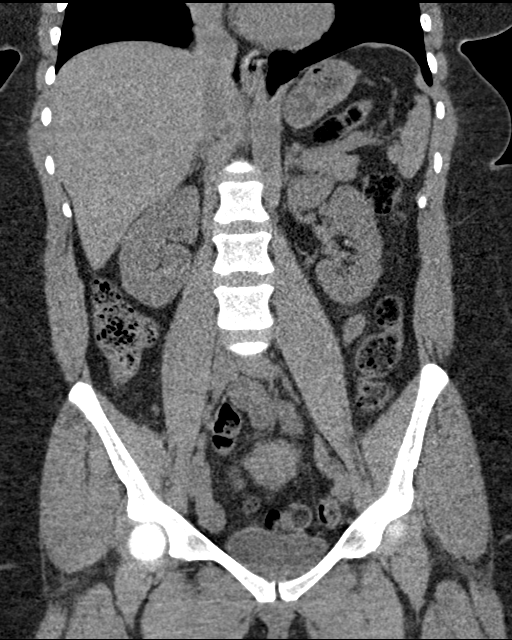
[im 84/151  soft-tissue]
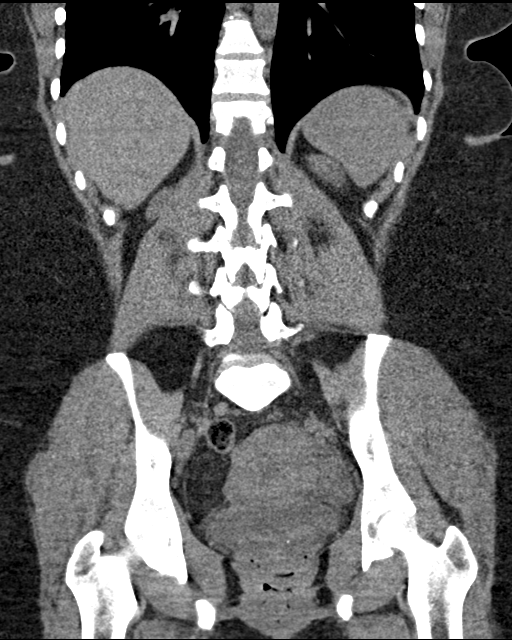

[16 of 46 positions shown; findings below may reference images not displayed]

FINDINGS: Lower chest: No acute abnormality.

Hepatobiliary: No focal liver abnormality is seen. No gallstones,
gallbladder wall thickening, or biliary dilatation.

Pancreas: Unremarkable. No pancreatic ductal dilatation or
surrounding inflammatory changes.

Spleen: Normal in size without focal abnormality.

Adrenals/Urinary Tract: Adrenal glands are normal. Multiple punctate
nonobstructing stones within the bilateral kidneys. No ureteral
stone. Bladder is normal.

Stomach/Bowel: Stomach is within normal limits. Appendix appears
normal. No evidence of bowel wall thickening, distention, or
inflammatory changes.

Vascular/Lymphatic: No significant vascular findings are present. No
enlarged abdominal or pelvic lymph nodes.

Reproductive: Uterus and bilateral adnexa are unremarkable.

Other: No abdominal wall hernia or abnormality. No abdominopelvic
ascites.

Musculoskeletal: No acute or significant osseous findings.
IMPRESSION: Negative for hydronephrosis or ureteral stone. Multiple punctate
nonobstructing stones within the bilateral kidneys.

## 2022-04-30 ENCOUNTER — Ambulatory Visit
Admission: RE | Admit: 2022-04-30 | Discharge: 2022-04-30 | Disposition: A | Discharge status: Home / Self Care | Payer: Medicaid Other | Source: Ambulatory Visit | Attending: Urology | Admitting: Urology

## 2022-04-30 DIAGNOSIS — R3129 Other microscopic hematuria: Secondary | ICD-10-CM | POA: Diagnosis present

## 2022-04-30 MED ORDER — IOHEXOL 300 MG/ML  SOLN
100.0000 mL | Freq: Once | INTRAMUSCULAR | Status: AC | PRN
  Administered 2022-04-30: 100 mL via INTRAVENOUS

## 2022-04-30 NOTE — Progress Notes (Addendum)
Ultrasound for IV access to both arms. Pt has nothing visualized either left or right lower forearms.Pt has nothing Rt upper arm using ultrasound.Able to provide access with # 20 ,2.5" catheter Lt upper FA for CT with great blood return.

## 2022-05-11 NOTE — Progress Notes (Unsigned)
05/12/22 10:23 AM   Theresa Mooney Theresa Mooney 01-30-1981 [id number]  Referring provider:  Georganna Skeans, MD [address] [place],  [place] [address]  Urological history: 1. Intermediate risk hematuria -former smoker -RUS, 01/2021 NED -CT renal stone study, 04/2020 - punctate stones -Negative cystoscopy with Dr. Lonna Cobb in 2021  -no reports of gross heme  HPI: Theresa Mooney is a 41 y.o.female who presents today for follow up for recheck on CT urogram.   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   She states that she has had issues with inflammation in the past and that is when they started to tell her that she was having blood in her urine.  So after I discussed the results of the CT scan with her she is wondering if she has some sort of autoimmune issue.  After the visit, she notified my CMA that she feels like she is getting another urinary tract infection.  Patient denies any modifying or aggravating factors.  Patient denies any gross hematuria, dysuria or suprapubic/flank pain.  Patient denies any fevers, chills, nausea or vomiting.    UA >30 RBC's, > 10 squames and many bacteria.   PMH: Past Medical History:  Diagnosis Date   Asthma    Cannabis dependence in remission [facility])    Ectopic pregnancy    Elevated glucose    Herpes 2002   Infection    Migraine    Mixed hyperlipidemia    Nicotine dependence    Trichimoniasis 12-2010   Urinary calculi     Surgical History: Past Surgical History:  Procedure Laterality Date   DILATION AND CURETTAGE OF UTERUS     LAPAROSCOPY  11/27/2011   Procedure: LAPAROSCOPY OPERATIVE;  Surgeon: Catalina Antigua, MD;  Location: [facility];  Service: Gynecology;  Laterality: Bilateral;   SALPINGECTOMY     Bilateral for twin ectopic preg.    TUBAL LIGATION  12-05-2010    Home Medications:  Allergies as of 05/12/2022       Reactions   Hydrocodone  Nausea Only   Dizziness   Penicillins    Did it involve swelling of the face/tongue/throat, SOB, or low BP? Y Did it involve sudden or severe rash/hives, skin peeling, or any reaction on the inside of your mouth or nose? N Did you need to seek medical attention at a hospital or doctor's office? Y When did it last happen?  Over 1 month     If all above answers are "NO", may proceed with cephalosporin use.        Medication List        Accurate as of May 12, 2022 10:23 AM. If you have any questions, ask your nurse or doctor.          albuterol 108 (90 Base) MCG/ACT inhaler Commonly known as: VENTOLIN HFA Inhale 2 puffs into the lungs every 6 (six) hours as needed for wheezing or shortness of breath.   Boric Acid Crys Place 600 mg vaginally at bedtime. Use vaginally every night for two weeks then twice a week   Diclofenac Sodium CR 100 MG 24 hr tablet Take by mouth.   omeprazole 20 MG capsule Commonly known as: PRILOSEC Take 1 capsule (20 mg total) by mouth daily.   oxybutynin 5 MG tablet Commonly known as: DITROPAN Take 1 tablet (5 mg total) by mouth 3 (three) times daily.   Vitamin D (  Ergocalciferol) 1.25 MG (50000 UNIT) Caps capsule Commonly known as: DRISDOL Take 1 capsule (50,000 Units total) by mouth every 7 (seven) days.        Allergies:  Allergies  Allergen Reactions   Hydrocodone Nausea Only    Dizziness   Penicillins     Did it involve swelling of the face/tongue/throat, SOB, or low BP? Y Did it involve sudden or severe rash/hives, skin peeling, or any reaction on the inside of your mouth or nose? N Did you need to seek medical attention at a hospital or doctor's office? Y When did it last happen?  Over 1 month     If all above answers are "NO", may proceed with cephalosporin use.      Family History: Family History  Problem Relation Age of Onset   Hypertension Mother    Asthma Mother    Hypertension Sister    Stroke Sister    Breast  cancer Maternal Aunt    Diabetes Maternal Grandmother    Heart disease Maternal Grandmother    Hypertension Maternal Grandmother    Diabetes Maternal Grandfather    Heart disease Maternal Grandfather    Hypertension Maternal Grandfather    Hyperlipidemia Daughter    Healthy Son    Healthy Son    Healthy Son    Anesthesia problems Neg Hx     Social History:  reports that she has quit smoking. Her smoking use included cigarettes. She has been exposed to tobacco smoke. She has never used smokeless tobacco. She reports current alcohol use of about 1.0 standard drink of alcohol per week. She reports that she does not currently use drugs after having used the following drugs: Marijuana.   Physical Exam: BP (!) 140/88   Pulse 92   Temp 98.5 F (36.9 C)   Ht 4\' 11"  (1.499 m)   Wt 217 lb (98.4 kg)   LMP 04/30/2022 (Exact Date)   BMI 43.83 kg/m   Constitutional:  Well nourished. Alert and oriented, No acute distress. HEENT: [place] AT, moist mucus membranes.  Trachea midline Cardiovascular: No clubbing, cyanosis, or edema. Respiratory: Normal respiratory effort, no increased work of breathing. Neurologic: Grossly intact, no focal deficits, moving all 4 extremities. Psychiatric: Normal mood and affect.      Laboratory Data: N/A  Pertinent imaging CLINICAL DATA:  Microscopic hematuria.   EXAM: CT ABDOMEN AND PELVIS WITHOUT AND WITH CONTRAST   TECHNIQUE: Multidetector CT imaging of the abdomen and pelvis was performed following the standard protocol before and following the bolus administration of intravenous contrast.   RADIATION DOSE REDUCTION: This exam was performed according to the departmental dose-optimization program which includes automated exposure control, adjustment of the mA and/or kV according to patient size and/or use of iterative reconstruction technique.   CONTRAST:  06/30/2022 OMNIPAQUE IOHEXOL 300 MG/ML  SOLN   COMPARISON:  CT stone study 05/01/2020    FINDINGS: Lower chest: Unremarkable.   Hepatobiliary: No suspicious focal abnormality within the liver parenchyma. There is no evidence for gallstones, gallbladder wall thickening, or pericholecystic fluid. No intrahepatic or extrahepatic biliary dilation.   Pancreas: No focal mass lesion. No dilatation of the main duct. No intraparenchymal cyst. No peripancreatic edema.   Spleen: No splenomegaly. No focal mass lesion.   Adrenals/Urinary Tract: No adrenal nodule or mass.   Precontrast imaging shows hyperintense foci involving multiple papillae of both kidneys, potentially reflecting tiny nonobstructing stones although Randall's plaques or highly concentrated urine could have this appearance. No evidence for ureteral or  bladder stones. No secondary changes in either kidney or ureter.   Imaging after IV contrast administration shows no suspicious enhancing lesion in either kidney. Delayed post-contrast imaging shows no wall thickening or soft tissue filling defect in either intrarenal collecting system or renal pelvis. Neither ureter is completely opacified but there is no evidence for focal hydroureter, ureteral wall thickening, or mass lesion along the length of either ureter. Delayed imaging of the bladder shows no focal wall thickening or mass lesion.   Stomach/Bowel: Stomach is unremarkable. No gastric wall thickening. No evidence of outlet obstruction. Duodenum is normally positioned as is the ligament of Treitz. No small bowel wall thickening. No small bowel dilatation. The terminal ileum is normal. The appendix is normal. No gross colonic mass. No colonic wall thickening.   Vascular/Lymphatic: No abdominal aortic aneurysm. There is no gastrohepatic or hepatoduodenal ligament lymphadenopathy. No retroperitoneal or mesenteric lymphadenopathy. No pelvic sidewall lymphadenopathy.   Reproductive: The uterus is unremarkable.  There is no adnexal mass.   Other: No  intraperitoneal free fluid.   Musculoskeletal: No worrisome lytic or sclerotic osseous abnormality.   IMPRESSION: 1. Precontrast imaging shows hyperintense foci involving multiple papillae of both kidneys, potentially reflecting tiny nonobstructing stones although Randall's plaques or highly concentrated urine could have this appearance. Otherwise, no findings to explain the patient's history of hematuria. 2. Otherwise unremarkable CT scan of the abdomen and pelvis.     Electronically Signed   By: Kennith Center M.D.   On: 05/01/2022 10:59 I have independently reviewed the films.  See HPI.    Assessment & Plan:    1. Intermediate risk hematuria -work up in 2021 - NED - no reports of gross heme - CT urogram demonstrated Randall plaque formation -Discussed the pathophysiology around the plaque formation with her - Recommend 24-hour metabolic work-up  2. Mixed incontinence -oxybutynin IR 5 TID  3. Suspected UTI -UA greater than 30 WBCs and many bacteria, but there are greater than 10 squames this is likely contaminated specimen -As patient has left the office I will send it for culture, pending urine culture results we may need to have her return for catheterized specimen to send for urinalysis and culture  Michiel Cowboy, [facility] [address], [address] [place], [place] [address] [phone]

## 2022-05-12 ENCOUNTER — Encounter: Payer: Self-pay | Admitting: Urology

## 2022-05-12 ENCOUNTER — Ambulatory Visit (INDEPENDENT_AMBULATORY_CARE_PROVIDER_SITE_OTHER): Payer: Medicaid Other | Admitting: Urology

## 2022-05-12 VITALS — BP 140/88 | HR 92 | Temp 98.5°F | Ht 59.0 in | Wt 217.0 lb

## 2022-05-12 DIAGNOSIS — R3989 Other symptoms and signs involving the genitourinary system: Secondary | ICD-10-CM

## 2022-05-12 DIAGNOSIS — N3946 Mixed incontinence: Secondary | ICD-10-CM

## 2022-05-12 DIAGNOSIS — R3129 Other microscopic hematuria: Secondary | ICD-10-CM | POA: Diagnosis not present

## 2022-05-12 LAB — URINALYSIS, COMPLETE
Bilirubin, UA: NEGATIVE
Glucose, UA: NEGATIVE
Ketones, UA: NEGATIVE
Leukocytes,UA: NEGATIVE
Nitrite, UA: NEGATIVE
Protein,UA: NEGATIVE
Specific Gravity, UA: 1.025 (ref 1.005–1.030)
Urobilinogen, Ur: 0.2 mg/dL (ref 0.2–1.0)
pH, UA: 6 (ref 5.0–7.5)

## 2022-05-12 LAB — MICROSCOPIC EXAMINATION
Epithelial Cells (non renal): >10 /hpf — AB (ref 0–10)
RBC, Urine: >30 /hpf — AB (ref 0–2)

## 2022-05-12 NOTE — Addendum Note (Signed)
Addended by: Martha Clan on: 05/12/2022 10:51 AM   Modules accepted: Orders

## 2022-05-12 NOTE — Patient Instructions (Addendum)
[facility] Instructions [facility]   You will receive a box/kit in the mail that will have a urine jug and instructions in the kit.  When the box arrives you will need to call our office [phone] to schedule a LAB appointment.   You will need to do a 24hour urine and this should be done during the days that our office will be open.  For example any day from Sunday through Thursday.   If you take Vitamin C 140m or greater please stop this 5 days prior to collection.   How to collect the urine sample: On the day you start the urine sample this 1st morning urine should NOT be collected.  For the rest of the day including all night urines should be collected.  On the next morning the 1st urine should be collected and then you will be finished with the urine collections.   You will need to bring the box with you on your LAB appointment day after urine has been collected and all instructions are complete in the box.  Your blood will be drawn and the box will be collected by our Lab employee to be sent off for analysis.   When urine and blood is complete you will need to schedule a follow up appointment for lab results.  You should get your kit with in 10-14 days. Call uK[place]if you do not receive it by 2 weeks.  We will call you with your results.

## 2022-05-12 NOTE — Addendum Note (Signed)
Addended by: Sinda Du on: 05/12/2022 10:32 AM   Modules accepted: Orders

## 2022-05-12 NOTE — Addendum Note (Signed)
Addended by: Michiel Cowboy A on: 05/12/2022 11:24 AM   Modules accepted: Orders

## 2022-05-16 LAB — CULTURE, URINE COMPREHENSIVE

## 2022-06-04 ENCOUNTER — Other Ambulatory Visit: Payer: Self-pay

## 2022-06-04 ENCOUNTER — Telehealth: Payer: Self-pay | Admitting: Urology

## 2022-06-04 DIAGNOSIS — R3129 Other microscopic hematuria: Secondary | ICD-10-CM

## 2022-06-04 NOTE — Telephone Encounter (Signed)
[facility] has been on hold until this week. 24 hr kit ordered for pt, should arrive 7-10 days, pt notified.

## 2022-06-04 NOTE — Telephone Encounter (Signed)
Patient called and stated she was supposed to get a urine kit in the mail, and that she never received it. I verified her address was correct in chart.

## 2022-06-17 ENCOUNTER — Other Ambulatory Visit: Payer: Self-pay | Admitting: Urology

## 2022-06-21 ENCOUNTER — Telehealth: Payer: Self-pay | Admitting: Urology

## 2022-06-21 NOTE — Telephone Encounter (Signed)
Patient will need another [employer] kit sent to her. She said she followed instructions inside the box, and had [employer] pick it up last Thursday (9/21). She also didn't schedule a lab appointment.

## 2022-06-22 ENCOUNTER — Other Ambulatory Visit: Payer: Self-pay

## 2022-06-22 DIAGNOSIS — N200 Calculus of kidney: Secondary | ICD-10-CM

## 2022-06-22 NOTE — Telephone Encounter (Signed)
[facility] kit resent. Pt will need lab appt for blood draw and kit drop off. Pt aware.

## 2022-06-23 LAB — LITHOLINK 24HR URINE PANEL
Ammonium, Urine: 133 mmol/24 hr — ABNORMAL HIGH (ref 15–60)
Calcium Oxalate Saturation: 3.85 — ABNORMAL LOW (ref 6.00–10.00)
Calcium Phosphate Saturation: 1.1 (ref 0.50–2.00)
Calcium, Urine: 123 mg/24 hr (ref <250)
Calcium/Creatinine Ratio: 139 mg/g creat (ref 34–196)
Calcium/Kg Body Weight: 1.3 mg/24 hr/kg (ref <4.0)
Chloride, Urine: 213 mmol/24 hr (ref 70–250)
Citrate, Urine: 470 mg/24 hr (ref >450)
Creatinine, Urine: 883 mg/24 hr
Creatinine/Kg Body Weight: 9.1 mg/24 hr/kg — ABNORMAL LOW (ref 11.9–24.4)
Cystine, Urine, Qualitative: NEGATIVE
Magnesium, Urine: 15 mg/24 hr — ABNORMAL LOW (ref 30–120)
Oxalate, Urine: 32 mg/24 hr (ref 20–40)
Phosphorus, Urine: 503 mg/24 hr — ABNORMAL LOW (ref 600–1200)
Potassium, Urine: 38 mmol/24 hr (ref 20–100)
Protein Catabolic Rate: 0.7 g/kg/24 hr — ABNORMAL LOW (ref 0.8–1.4)
Sodium, Urine: 232 mmol/24 hr — ABNORMAL HIGH (ref 50–150)
Sulfate, Urine: 45 meq/24 hr (ref 20–80)
Urea Nitrogen, Urine: 8.49 g/24 hr (ref 6.00–14.00)
Uric Acid Saturation: 0 (ref <1.00)
Uric Acid, Urine: 516 mg/24 hr (ref <800)
Urine Volume (Preserved): 1450 mL/24 hr (ref 500–4000)
pH, 24 hr, Urine: 8.576 — ABNORMAL HIGH (ref 5.800–6.200)

## 2022-06-24 ENCOUNTER — Other Ambulatory Visit: Payer: Self-pay

## 2022-06-24 DIAGNOSIS — N200 Calculus of kidney: Secondary | ICD-10-CM

## 2022-06-29 NOTE — Progress Notes (Unsigned)
Virtual Visit via Telephone Note  I connected with Theresa Mooney on 06/30/22 at  9:30 AM EDT by telephone and verified that I am speaking with the correct person using two identifiers.  Location: Patient: Home Provider: Office   I discussed the limitations, risks, security and privacy concerns of performing an evaluation and management service by telephone and the availability of in person appointments. I also discussed with the patient that there may be a patient responsible charge related to this service. The patient expressed understanding and agreed to proceed.   History of Present Illness: 1. Intermediate risk hematuria -former smoker -RUS, 01/2021 NED -CT renal stone study, 04/2020 - punctate stones -Negative cystoscopy with Dr. Bernardo Heater in 2021  -no reports of gross heme  2. Nephrolithiasis -CT renal stone study (11/2017) Multiple bilateral nonobstructing renal calculi measuring up to 3 mm -CT renal stone study (04/2020) Multiple punctate nonobstructing stones within the bilateral kidneys -CTU (04/2022) Precontrast imaging shows hyperintense foci involving multiple papillae of both kidneys, potentially reflecting tiny nonobstructing stones although Randall's plaques or highly concentrated urine could have this appearance.   Observations/Objective: Component     Latest Ref Rng 06/17/2022  Cystine, Urine, Qualitative     Negative  Neg   Urine Volume (Preserved)     500 - 4,000 mL/24 hr 1,450   Calcium Oxalate Saturation     6.00 - 10.00  3.85 (L)   Calcium, Urine     <250 mg/24 hr 123   Oxalate, Urine     20 - 40 mg/24 hr 32   Citrate, Urine     >450 mg/24 hr 470   Calcium Phosphate Saturation     0.50 - 2.00  1.10   pH, 24 hr, Urine     5.800 - 6.200  8.576 (H)   Uric Acid Saturation     <1.00  0.00   Uric Acid, Urine     <800 mg/24 hr 516   Sodium, Urine     50 - 150 mmol/24 hr 232 (H)   Potassium, Urine     20 - 100 mmol/24 hr 38   Magnesium, Urine     30 -  120 mg/24 hr 15 (L)   Phosphorus, Urine     600 - 1,200 mg/24 hr 503 (L)   Ammonium, Urine     15 - 60 mmol/24 hr 133 (H)   Chloride, Urine     70 - 250 mmol/24 hr 213   Sulfate, Urine     20 - 80 meq/24 hr 45   Urea Nitrogen, Urine     6.00 - 14.00 g/24 hr 8.49   Protein Catabolic Rate     0.8 - 1.4 g/kg/24 hr 0.7 (L)   Creatinine, Ur 09N     Not Applic. AT/55 hr 732   Creatinine/Kg Body Weight     11.9 - 24.4 mg/24 hr/kg 9.1 (L)   Calcium/Kg Body Weight     <4.0 mg/24 hr/kg 1.3   Calcium/Creatinine Ratio     34 - 196 mg/g creat 139   Comment Note     Legend: (L) Low (H) High  Assessment and Plan: 1. Low urinary volume -" Normal" and " Optimal" 24-hour urine volumes are 2000 to 2500 mL, respectively -This is slightly over half a gallon or 4 pints daily -One half of fluid should be water only -Avoid using electrolyte sports drinks and similar products as they contain too much sodium -Cranberry juice is not recommended, but  if needed to help prevent recurrent UTI's, a glass or 2 a day is not a problem as long as the goal of 2000 to 2500 mL of fluid is obtained with half of it being water -A good substitute for water is lemonade made with real lemon juice as lemon juice is high in citrate and orange juice is high in citrate as well, but will need to be drink sparingly if diabetic -Flavoring agents for water are not ideal, but look for flavoring agents that have citric acid as its main ingredient and is low in sodium -Substituting high fluid content desserts such as frozen ices, sherbet, melons, grapes and other fruits in place of cookies, pastries and cakes is helpful -Maintaining a humidity level in the home and workplace between 40% to 45% to minimize insensible fluid loss through the skin and normal respiration -Limit salt and sodium intake -Optimal urinary specific gravity reading should consistently be 1.005 or less -drink enough urine so that the urine does not appear  darker than very pale yellow -Refer to "Penalty List" for further ideas to increase fluid intake   2. High urine sodium - encouraged her to watch for the sodium content of her foods - increase water intake to offset her sodium consumption  -"Normal" 24-hour urinary sodium is less than 200 mg. -"Optimal" 24-hour urinary sodium is less than 150 mg.  Follow Up Instructions:  Return for CMP, PTH, uric acid and Vitamin D levels  I discussed the assessment and treatment plan with the patient. The patient was provided an opportunity to ask questions and all were answered. The patient agreed with the plan and demonstrated an understanding of the instructions.   The patient was advised to call back or seek an in-person evaluation if the symptoms worsen or if the condition fails to improve as anticipated.  I provided 10 minutes of non-face-to-face time during this encounter.   Samarion Ehle, PA-C

## 2022-06-30 ENCOUNTER — Ambulatory Visit (INDEPENDENT_AMBULATORY_CARE_PROVIDER_SITE_OTHER): Payer: Medicaid Other | Admitting: Urology

## 2022-06-30 ENCOUNTER — Encounter: Payer: Self-pay | Admitting: Urology

## 2022-06-30 ENCOUNTER — Other Ambulatory Visit: Payer: Medicaid Other

## 2022-06-30 DIAGNOSIS — R34 Anuria and oliguria: Secondary | ICD-10-CM | POA: Diagnosis not present

## 2022-06-30 DIAGNOSIS — R82998 Other abnormal findings in urine: Secondary | ICD-10-CM | POA: Diagnosis not present

## 2022-06-30 DIAGNOSIS — N200 Calculus of kidney: Secondary | ICD-10-CM

## 2022-06-30 NOTE — Patient Instructions (Signed)
"  Penalty" List  Bathroom Penalty: An extra 4-ounce glass of water with every visit to the bathroom, regardless of the reason. Chair Penalty: Have a bottle of water and a 4-ounce glass next to their favorite chairs at home. Instruct the patient to drink one 4-ounce glass before getting up from the chair. Cheating Penalty: If they eat or drink a restricted item beyond the allowable limit, one extra glass of water. Kitchen Penalty: One glass of water whenever they walk into their kitchen. Leaving Home Penalty. One extra glass of water prior to leaving home and another when they return. Meal Penalty: One extra glass with each meal except when they eat out, where they will need two extra glasses. Medication Penalty: One extra glass of water when taking medications. This is in addition to whatever patients are instructed to take normally with their medications. Nighttime Bathroom Use Penalty: One glass of water whenever they get out of bed to go to the bathroom at night. Snack Penalty: One extra glass of water if they have a snack between meals or bedtime. Summertime Penalty: Double all other penalties during the summer or when outside temperatures exceed 85 degrees. Time Penalty: One glass of water if the patient has managed to avoid all other "penalties" for 2 hours during the daytime. Water Fountain Penalty: They must drink at least 5 swallows whenever they pass a water fountain. Work Penalty: One glass of water whenever they leave their main desk or workplace. Patients should always have water and a 4-ounce glass available on their desks, in their cars, or within easy reach.  

## 2022-07-01 ENCOUNTER — Other Ambulatory Visit: Payer: Self-pay

## 2022-07-01 DIAGNOSIS — N200 Calculus of kidney: Secondary | ICD-10-CM

## 2022-07-05 ENCOUNTER — Other Ambulatory Visit: Payer: Medicaid Other

## 2022-07-05 DIAGNOSIS — N200 Calculus of kidney: Secondary | ICD-10-CM

## 2022-07-08 LAB — PTH, INTACT AND CALCIUM

## 2022-07-08 LAB — COMPREHENSIVE METABOLIC PANEL
ALT: 9 IU/L (ref 0–32)
AST: 13 IU/L (ref 0–40)
Albumin/Globulin Ratio: 1.6 (ref 1.2–2.2)
Albumin: 4.1 g/dL (ref 3.9–4.9)
Alkaline Phosphatase: 94 IU/L (ref 44–121)
BUN/Creatinine Ratio: 14 (ref 9–23)
BUN: 12 mg/dL (ref 6–20)
Bilirubin Total: 0.2 mg/dL (ref 0.0–1.2)
CO2: 23 mmol/L (ref 20–29)
Calcium: 9 mg/dL (ref 8.7–10.2)
Chloride: 103 mmol/L (ref 96–106)
Creatinine, Ser: 0.86 mg/dL (ref 0.57–1.00)
Globulin, Total: 2.6 g/dL (ref 1.5–4.5)
Glucose: 85 mg/dL (ref 70–99)
Potassium: 4.6 mmol/L (ref 3.5–5.2)
Sodium: 137 mmol/L (ref 134–144)
Total Protein: 6.7 g/dL (ref 6.0–8.5)
eGFR: 89 mL/min/{1.73_m2} (ref >59)

## 2022-07-08 LAB — URIC ACID: Uric Acid: 3.8 mg/dL (ref 2.6–6.2)

## 2022-07-08 LAB — VITAMIN D 25 HYDROXY (VIT D DEFICIENCY, FRACTURES): Vit D, 25-Hydroxy: 24.9 ng/mL — ABNORMAL LOW (ref 30.0–100.0)

## 2022-07-30 ENCOUNTER — Ambulatory Visit
Admission: EM | Admit: 2022-07-30 | Discharge: 2022-07-30 | Disposition: A | Discharge status: Home / Self Care | Payer: Medicaid Other | Attending: Internal Medicine | Admitting: Internal Medicine

## 2022-07-30 DIAGNOSIS — R300 Dysuria: Secondary | ICD-10-CM | POA: Insufficient documentation

## 2022-07-30 DIAGNOSIS — N898 Other specified noninflammatory disorders of vagina: Secondary | ICD-10-CM | POA: Insufficient documentation

## 2022-07-30 DIAGNOSIS — Z113 Encounter for screening for infections with a predominantly sexual mode of transmission: Secondary | ICD-10-CM | POA: Insufficient documentation

## 2022-07-30 LAB — POCT URINALYSIS DIP (MANUAL ENTRY)
Bilirubin, UA: NEGATIVE
Glucose, UA: NEGATIVE mg/dL
Ketones, POC UA: NEGATIVE mg/dL
Leukocytes, UA: NEGATIVE
Nitrite, UA: NEGATIVE
Protein Ur, POC: NEGATIVE mg/dL
Spec Grav, UA: 1.025 (ref 1.010–1.025)
Urobilinogen, UA: 0.2 E.U./dL
pH, UA: 7 (ref 5.0–8.0)

## 2022-07-30 LAB — POCT URINE PREGNANCY: Preg Test, Ur: NEGATIVE

## 2022-07-30 NOTE — Discharge Instructions (Signed)
Your urine was clear from infection. Your vaginal swab is pending. We will call if it positive. Please follow up with urology.

## 2022-07-30 NOTE — ED Triage Notes (Signed)
Pt presents to uc with co of frequent urination, cva pain, and dysuria since Wednesday. Pt reports taking left over antibiotic and prednisone at home for symptoms but only had 2 days worth of medication.

## 2022-07-30 NOTE — ED Provider Notes (Signed)
[name]    CSN: [id number] Arrival date & time: 07/30/22  1239      History   Chief Complaint Chief Complaint  Patient presents with   Dysuria   Urinary Frequency    HPI Nasiyah VERLINDA SLOTNICK is a 41 y.o. female.   Patient presents with urinary frequency, urinary burning, mild lower abdominal pain, lower back pain that started about 2 to 3 days ago.  Patient reports that she has a history of frequent urinary tract infections so she took 2 pills of leftover cephalexin and prednisone with minimal improvement in symptoms.  Patient reports that she has hematuria at baseline.  Denies any abnormal vaginal bleeding or fever.  Patient does report that she has had some minimal vaginal discharge that was present prior to urinary symptoms starting.  Denies any confirmed exposure to STD.  She has had unprotected sexual intercourse with same sexual partner.  Patient is followed by urology given frequent urinary tract infections, microscopic hematuria, kidney stones.   Dysuria Urinary Frequency    Past Medical History:  Diagnosis Date   Asthma    Cannabis dependence in remission [facility])    Ectopic pregnancy    Elevated glucose    Herpes 2002   Infection    Migraine    Mixed hyperlipidemia    Nicotine dependence    Trichimoniasis 12-2010   Urinary calculi     Patient Active Problem List   Diagnosis Date Noted   Bacterial vaginitis 03/19/2021   Pain in both lower extremities 03/17/2021   Migraine without status migrainosus, not intractable 06/23/2020   h/o postpartum BTL  12/13/2011   S/P ectopic pregnancy 12/13/2011    Past Surgical History:  Procedure Laterality Date   DILATION AND CURETTAGE OF UTERUS     LAPAROSCOPY  11/27/2011   Procedure: LAPAROSCOPY OPERATIVE;  Surgeon: Catalina Antigua, MD;  Location: [facility];  Service: Gynecology;  Laterality: Bilateral;   SALPINGECTOMY     Bilateral for twin ectopic preg.    TUBAL LIGATION  12-05-2010    OB History      Gravida  7   Para  5   Term      Preterm      AB  2   Living  5      SAB      IAB  1   Ectopic  1   Multiple      Live Births  5            Home Medications    Prior to Admission medications   Medication Sig Start Date End Date Taking? Authorizing Provider  albuterol (VENTOLIN HFA) 108 (90 Base) MCG/ACT inhaler Inhale 2 puffs into the lungs every 6 (six) hours as needed for wheezing or shortness of breath. 08/06/21   Georganna Skeans, MD  Boric Acid [address] 600 mg vaginally at bedtime. Use vaginally every night for two weeks then twice a week Patient not taking: Reported on 06/30/2022 10/26/21   Constant, Peggy, MD  Diclofenac Sodium CR 100 MG 24 hr tablet Take by mouth. Patient not taking: Reported on 06/30/2022 05/01/20   [provider]  omeprazole (PRILOSEC) 20 MG capsule Take 1 capsule (20 mg total) by mouth daily. 05/19/20   Robinson, [place] N, PA-C  oxybutynin (DITROPAN) 5 MG tablet Take 1 tablet (5 mg total) by mouth 3 (three) times daily. Patient not taking: Reported on 06/30/2022 03/23/22   Michiel Cowboy A, PA-C  Vitamin D, Ergocalciferol, (DRISDOL) 1.25  MG (50000 UNIT) CAPS capsule Take 1 capsule (50,000 Units total) by mouth every 7 (seven) days. 01/27/22   Dorna Mai, MD  SUMAtriptan (IMITREX) 50 MG tablet Take 1 tablet (50 mg total) by mouth every 2 (two) hours as needed for migraine. May repeat in 2 hours if headache persists or recurs. Patient not taking: Reported on 07/02/2020 06/23/20 07/29/20  Mayers, Loraine Grip, PA-C    Family History Family History  Problem Relation Age of Onset   Hypertension Mother    Asthma Mother    Hypertension Sister    Stroke Sister    Breast cancer Maternal Aunt    Diabetes Maternal Grandmother    Heart disease Maternal Grandmother    Hypertension Maternal Grandmother    Diabetes Maternal Grandfather    Heart disease Maternal Grandfather    Hypertension Maternal Grandfather    Hyperlipidemia Daughter     Healthy Son    Healthy Son    Healthy Son    Anesthesia problems Neg Hx     Social History Social History   Tobacco Use   Smoking status: Former    Types: Cigarettes    Passive exposure: Past   Smokeless tobacco: Never  Vaping Use   Vaping Use: Never used  Substance Use Topics   Alcohol use: Yes    Alcohol/week: 1.0 standard drink of alcohol    Types: 1 Glasses of wine per week    Comment: social   Drug use: Not Currently    Types: Marijuana     Allergies   Hydrocodone and Penicillins   Review of Systems Review of Systems Per HPI  Physical Exam Triage Vital Signs ED Triage Vitals  Enc Vitals Group     BP 07/30/22 1334 106/79     Pulse Rate 07/30/22 1334 87     Resp 07/30/22 1334 19     Temp 07/30/22 1334 98.7 F (37.1 C)     Temp src --      SpO2 07/30/22 1334 98 %     Weight --      Height --      Head Circumference --      Peak Flow --      Pain Score 07/30/22 1333 8     Pain Loc --      Pain Edu? --      Excl. in [place]? --    No data found.  Updated Vital Signs BP 106/79   Pulse 87   Temp 98.7 F (37.1 C)   Resp 19   LMP 06/14/2022 (Exact Date)   SpO2 98%   Visual Acuity Right Eye Distance:   Left Eye Distance:   Bilateral Distance:    Right Eye Near:   Left Eye Near:    Bilateral Near:     Physical Exam Constitutional:      General: She is not in acute distress.    Appearance: Normal appearance. She is not toxic-appearing or diaphoretic.  HENT:     Head: Normocephalic and atraumatic.  Eyes:     Extraocular Movements: Extraocular movements intact.     Conjunctiva/sclera: Conjunctivae normal.  Cardiovascular:     Rate and Rhythm: Normal rate and regular rhythm.     Pulses: Normal pulses.     Heart sounds: Normal heart sounds.  Pulmonary:     Effort: Pulmonary effort is normal. No respiratory distress.     Breath sounds: Normal breath sounds.  Abdominal:     General: Bowel sounds are normal.  There is no distension.      Palpations: Abdomen is soft.     Tenderness: There is no abdominal tenderness.  Genitourinary:    Comments: Deferred with shared decision making.  Self swab performed. Neurological:     General: No focal deficit present.     Mental Status: She is alert and oriented to person, place, and time. Mental status is at baseline.  Psychiatric:        Mood and Affect: Mood normal.        Behavior: Behavior normal.        Thought Content: Thought content normal.        Judgment: Judgment normal.      UC Treatments / Results  Labs (all labs ordered are listed, but only abnormal results are displayed) Labs Reviewed  POCT URINALYSIS DIP (MANUAL ENTRY) - Abnormal; Notable for the following components:      Result Value   Blood, UA moderate (*)    All other components within normal limits  CERVICOVAGINAL ANCILLARY ONLY    EKG   Radiology No results found.  Procedures Procedures (including critical care time)  Medications Ordered in UC Medications - No data to display  Initial Impression / Assessment and Plan / UC Course  I have reviewed the triage vital signs and the nursing notes.  Pertinent labs & imaging results that were available during my care of the patient were reviewed by me and considered in my medical decision making (see chart for details).     Urinalysis not indicating urinary tract infection.  Given this, will defer Urine culture. It does show red blood cells which is baseline for patient.  Given vaginal discharge, I am suspicious that vaginitis could be cause of symptoms so cervicovaginal swab is pending.  Will await results for any further treatment.  Given patient is followed by urology, advised patient to follow-up with urology for further evaluation and management as soon as possible.  She was given strict return precautions.  Patient verbalized understanding and was agreeable with plan. Final Clinical Impressions(s) / UC Diagnoses   Final diagnoses:  Dysuria   Vaginal discharge  Screening examination for venereal disease     Discharge Instructions      Your urine was clear from infection. Your vaginal swab is pending. We will call if it positive. Please follow up with urology.     ED Prescriptions   None    PDMP not reviewed this encounter.   Gustavus Bryant, [place] 07/30/22 1426

## 2022-08-02 LAB — CERVICOVAGINAL ANCILLARY ONLY
Bacterial Vaginitis (gardnerella): NEGATIVE
Candida Glabrata: NEGATIVE
Candida Vaginitis: NEGATIVE
Chlamydia: NEGATIVE
Comment: NEGATIVE
Comment: NEGATIVE
Comment: NEGATIVE
Comment: NEGATIVE
Comment: NEGATIVE
Comment: NORMAL
Neisseria Gonorrhea: NEGATIVE
Trichomonas: NEGATIVE

## 2022-08-04 ENCOUNTER — Other Ambulatory Visit: Payer: Self-pay

## 2022-08-04 ENCOUNTER — Ambulatory Visit: Payer: Self-pay | Admitting: *Deleted

## 2022-08-04 DIAGNOSIS — E559 Vitamin D deficiency, unspecified: Secondary | ICD-10-CM

## 2022-08-04 NOTE — Telephone Encounter (Signed)
Summary: Pt wants increase on med and refuses appt   Vitamin D, Ergocalciferol, (DRISDOL) 1.25 MG (50000 UNIT) CAPS capsule Pt has called wanting an increase in this dosage as states she is still very weak and fatigued. Pt has not seen dr since 6/9. Offered pt an appt if she feels this is not working and pt upset that every time she gets a refill and states the dosage needs to be adjusted told she needs an appt. Pt denies appt but states a nurse can call her. [phone]      Attempted to call patient- no answer- voice mail not set up.

## 2022-08-04 NOTE — Telephone Encounter (Signed)
Requested medication (s) are due for refill today: yes  Requested medication (s) are on the active medication list: yes  Last refill:  01/27/22 #12/0  Future visit scheduled: yes  Notes to clinic:  Unable to refill per protocol, cannot delegate.    Requested Prescriptions  Pending Prescriptions Disp Refills   Vitamin D, Ergocalciferol, (DRISDOL) 1.25 MG (50000 UNIT) CAPS capsule 12 capsule 0    Sig: Take 1 capsule (50,000 Units total) by mouth every 7 (seven) days.     Endocrinology:  Vitamins - Vitamin D Supplementation 2 Failed - 08/04/2022  9:59 AM      Failed - Manual Review: Route requests for 50,000 IU strength to the provider      Failed - Vitamin D in normal range and within 360 days    Vit D, 25-Hydroxy  Date Value Ref Range Status  07/05/2022 24.9 (L) 30.0 - 100.0 ng/mL Final    Comment:    Vitamin D deficiency has been defined by the Institute of Medicine and an Endocrine Society practice guideline as a level of serum 25-OH vitamin D less than 20 ng/mL (1,2). The Endocrine Society went on to further define vitamin D insufficiency as a level between 21 and 29 ng/mL (2). 1. IOM (Institute of Medicine). 2010. Dietary reference    intakes for calcium and D. [place]: The    [employer]. 2. Holick MF, Binkley [place], Bischoff-Ferrari HA, et al.    Evaluation, treatment, and prevention of vitamin D    deficiency: an Endocrine Society clinical practice    guideline. JCEM. 2011 Jul; 96(7):1911-30.          Passed - Ca in normal range and within 360 days    Calcium  Date Value Ref Range Status  07/05/2022 9.0 8.7 - 10.2 mg/dL Final         Passed - Valid encounter within last 12 months    Recent Outpatient Visits           4 months ago Annual physical exam   Primary Care at [facility], MD   9 months ago Breast pain, right   Primary Care at [facility], MD   12 months ago Mild intermittent asthma without  complication   Primary Care at [facility], MD   1 year ago Syncope, unspecified syncope type   Primary Care at [facility], Kandee Keen, MD   1 year ago Vitamin D deficiency   [facility] Care at [facility], Kandee Keen, MD       Future Appointments             In 6 days Rema Fendt, NP Primary Care at [facility]

## 2022-08-04 NOTE — Telephone Encounter (Signed)
  Chief Complaint: medication assistance Symptoms: fatigue weakness, no energy  Frequency: several weeks Pertinent Negatives: NA Disposition: [] ED /[] Urgent Care (no appt availability in office) / [x] Appointment(In office/virtual)/ []  [facility]/ [] Home Care/ [] Refused Recommended Disposition /[] [place] [facility]/ []  Follow-up with PCP Additional Notes: Pt states she is needing refill on Vit D 50000 IU but is also wanting to see if she can take any additional Vit D since she is so fatigued and her lab work still shows low vit D levels. Pt unsure if she needed to take Iron as well. Advised pt since I didn't see a Iron panel previously done that she could schedule appt and see if labs are needed and discuss with provider about taking extra vit D and iron supplements. Scheduled appt for 08/10/22 at 0840. Also advised pt I would send refill request to provider.   Summary: Pt wants increase on med and refuses appt   Vitamin D, Ergocalciferol, (DRISDOL) 1.25 MG (50000 UNIT) CAPS capsule Pt has called wanting an increase in this dosage as states she is still very weak and fatigued. Pt has not seen dr since 6/9. Offered pt an appt if she feels this is not working and pt upset that every time she gets a refill and states the dosage needs to be adjusted told she needs an appt. Pt denies appt but states a nurse can call her. [phone]      Reason for Disposition  Prescription request for new medicine (not a refill)  Answer Assessment - Initial Assessment Questions 1. DRUG NAME: "What medicine do you need to have refilled?"     Vit D 50000 IU 2. REFILLS REMAINING: "How many refills are remaining?" (Note: The label on the medicine or pill bottle will show how many refills are remaining. If there are no refills remaining, then a renewal may be needed.)     0 4. PRESCRIBING HCP: "Who prescribed it?" Reason: If prescribed by specialist, call should be referred to that group.     Dr.   5. SYMPTOMS: "Do you have any symptoms?"     Fatigue and weakness, no energy  Protocols used: Medication Refill and Renewal Call-A-AH

## 2022-08-05 ENCOUNTER — Other Ambulatory Visit: Payer: Self-pay | Admitting: *Deleted

## 2022-08-05 DIAGNOSIS — N200 Calculus of kidney: Secondary | ICD-10-CM

## 2022-08-05 NOTE — Progress Notes (Deleted)
Patient ID: Theresa Mooney, female    DOB: [birth year]  MRN: [id number]  CC: No chief complaint on file.   Subjective: Theresa Mooney is a 41 y.o. female who presents for  Her concerns today include:  08/04/2022 per triage RN note:    Summary: Pt wants increase on med and refuses appt    Vitamin D, Ergocalciferol, (DRISDOL) 1.25 MG (50000 UNIT) CAPS capsule Pt has called wanting an increase in this dosage as states she is still very weak and fatigued. Pt has not seen dr since 6/9. Offered pt an appt if she feels this is not working and pt upset that every time she gets a refill and states the dosage needs to be adjusted told she needs an appt. Pt denies appt but states a nurse can call her. [phone]      Attempted to call patient- no answer- voice mail not set up.      Chief Complaint: medication assistance Symptoms: fatigue weakness, no energy  Frequency: several weeks Pertinent Negatives: NA Disposition: [] ED /[] Urgent Care (no appt availability in office) / [x] Appointment(In office/virtual)/ []  [facility] [facility]/ [] Home Care/ [] Refused Recommended Disposition /[] [place] [facility]/ []  Follow-up with PCP Additional Notes: Pt states she is needing refill on Vit D 50000 IU but is also wanting to see if she can take any additional Vit D since she is so fatigued and her lab work still shows low vit D levels. Pt unsure if she needed to take Iron as well. Advised pt since I didn't see a Iron panel previously done that she could schedule appt and see if labs are needed and discuss with provider about taking extra vit D and iron supplements. Scheduled appt for 08/10/22 at 0840. Also advised pt I would send refill request to provider.       Summary: Pt wants increase on med and refuses appt    Vitamin D, Ergocalciferol, (DRISDOL) 1.25 MG (50000 UNIT) CAPS capsule Pt has called wanting an increase in this dosage as states she is still very weak and fatigued. Pt has not seen dr  since 6/9. Offered pt an appt if she feels this is not working and pt upset that every time she gets a refill and states the dosage needs to be adjusted told she needs an appt. Pt denies appt but states a nurse can call her. [phone]        Reason for Disposition  Prescription request for new medicine (not a refill)  Answer Assessment - Initial Assessment Questions 1. DRUG NAME: "What medicine do you need to have refilled?"     Vit D 50000 IU 2. REFILLS REMAINING: "How many refills are remaining?" (Note: The label on the medicine or pill bottle will show how many refills are remaining. If there are no refills remaining, then a renewal may be needed.)     0 4. PRESCRIBING HCP: "Who prescribed it?" Reason: If prescribed by specialist, call should be referred to that group.     Dr.  5. SYMPTOMS: "Do you have any symptoms?"     Fatigue and weakness, no energy  Protocols used: Medication Refill and Renewal Call-A-AH   Result note 07/09/2022 from PA at [facility] [place] Please let Mrs. Hornbaker know that her Vitamin D level was low and they were not able to complete the PTH.  We will need to get another blood sample from her.    Today's visit 08/10/2022: Vitamin D checked 07/05/2022  Patient Active Problem List   Diagnosis Date Noted   Bacterial vaginitis 03/19/2021   Pain in both lower extremities 03/17/2021   Migraine without status migrainosus, not intractable 06/23/2020   h/o postpartum BTL  12/13/2011   S/P ectopic pregnancy 12/13/2011     Current Outpatient Medications on File Prior to Visit  Medication Sig Dispense Refill   albuterol (VENTOLIN HFA) 108 (90 Base) MCG/ACT inhaler Inhale 2 puffs into the lungs every 6 (six) hours as needed for wheezing or shortness of breath. 18 g 2   Boric Acid CRYS Place 600 mg vaginally at bedtime. Use vaginally every night for two weeks then twice a week (Patient not taking: Reported on 06/30/2022) 500 g 5   Diclofenac  Sodium CR 100 MG 24 hr tablet Take by mouth. (Patient not taking: Reported on 06/30/2022)     omeprazole (PRILOSEC) 20 MG capsule Take 1 capsule (20 mg total) by mouth daily. 14 capsule 0   oxybutynin (DITROPAN) 5 MG tablet Take 1 tablet (5 mg total) by mouth 3 (three) times daily. (Patient not taking: Reported on 06/30/2022) 90 tablet 0   Vitamin D, Ergocalciferol, (DRISDOL) 1.25 MG (50000 UNIT) CAPS capsule Take 1 capsule (50,000 Units total) by mouth every 7 (seven) days. 12 capsule 0   [DISCONTINUED] SUMAtriptan (IMITREX) 50 MG tablet Take 1 tablet (50 mg total) by mouth every 2 (two) hours as needed for migraine. May repeat in 2 hours if headache persists or recurs. (Patient not taking: Reported on 07/02/2020) 10 tablet 0   No current facility-administered medications on file prior to visit.    Allergies  Allergen Reactions   Hydrocodone Nausea Only    Dizziness   Penicillins     Did it involve swelling of the face/tongue/throat, SOB, or low BP? Y Did it involve sudden or severe rash/hives, skin peeling, or any reaction on the inside of your mouth or nose? N Did you need to seek medical attention at a hospital or doctor's office? Y When did it last happen?  Over 1 month     If all above answers are "NO", may proceed with cephalosporin use.      Social History   Socioeconomic History   Marital status: Single    Spouse name: Not on file   Number of children: Not on file   Years of education: Not on file   Highest education level: Not on file  Occupational History   Not on file  Tobacco Use   Smoking status: Former    Types: Cigarettes    Passive exposure: Past   Smokeless tobacco: Never  Vaping Use   Vaping Use: Never used  Substance and Sexual Activity   Alcohol use: Yes    Alcohol/week: 1.0 standard drink of alcohol    Types: 1 Glasses of wine per week    Comment: social   Drug use: Not Currently    Types: Marijuana   Sexual activity: Yes    Birth control/protection:  Surgical  Other Topics Concern   Not on file  Social History Narrative   Not on file   Social Determinants of Health   Financial Resource Strain: Not on file  Food Insecurity: Not on file  Transportation Needs: Not on file  Physical Activity: Not on file  Stress: Not on file  Social Connections: Not on file  Intimate Partner Violence: Not on file    Family History  Problem Relation Age of Onset   Hypertension Mother    Asthma  Mother    Hypertension Sister    Stroke Sister    Breast cancer Maternal Aunt    Diabetes Maternal Grandmother    Heart disease Maternal Grandmother    Hypertension Maternal Grandmother    Diabetes Maternal Grandfather    Heart disease Maternal Grandfather    Hypertension Maternal Grandfather    Hyperlipidemia Daughter    Healthy Son    Healthy Son    Healthy Son    Anesthesia problems Neg Hx     Past Surgical History:  Procedure Laterality Date   DILATION AND CURETTAGE OF UTERUS     LAPAROSCOPY  11/27/2011   Procedure: LAPAROSCOPY OPERATIVE;  Surgeon: Catalina Antigua, MD;  Location: [facility];  Service: Gynecology;  Laterality: Bilateral;   SALPINGECTOMY     Bilateral for twin ectopic preg.    TUBAL LIGATION  12-05-2010    ROS: Review of Systems Negative except as stated above  PHYSICAL EXAM: LMP 06/14/2022 (Exact Date)   Physical Exam  {female adult master:[id number]} {female adult master:[id number]}     Latest Ref Rng & Units 07/05/2022   10:14 AM 03/25/2022    1:41 PM 07/06/2021   10:55 AM  CMP  Glucose 70 - 99 mg/dL 85  81  82   BUN 6 - 20 mg/dL 12  12  9    Creatinine 0.57 - 1.00 mg/dL  4.16  6.06   Sodium 134 - 144 mmol/L 137  138  139   Potassium 3.5 - 5.2 mmol/L 4.6  4.1  4.1   Chloride 96 - 106 mmol/L 103  102  105   CO2 20 - 29 mmol/L 23  21  29    Calcium 8.7 - 10.2 mg/dL 9.0  9.1  8.7   Total Protein 6.0 - 8.5 g/dL 6.7  7.5  6.7    7.0   Total Bilirubin 0.0 - 1.2 mg/dL 0.2  0.3  0.4   Alkaline Phos 44 - 121 IU/L 94  96     AST 0 - 40 IU/L 13  14  13    ALT 0 - 32 IU/L 9  9  9     Lipid Panel     Component Value Date/Time   CHOL 184 03/25/2022 1341   TRIG 69 03/25/2022 1341   HDL 72 03/25/2022 1341   CHOLHDL 2.6 03/25/2022 1341   LDLCALC 99 03/25/2022 1341    CBC    Component Value Date/Time   WBC 4.2 03/25/2022 1341   WBC 4.6 07/06/2021 1055   RBC 4.09 03/25/2022 1341   RBC 4.02 07/06/2021 1055   HGB 11.2 03/25/2022 1341   HCT 35.7 03/25/2022 1341   PLT 309 03/25/2022 1341   MCV 87 03/25/2022 1341   MCH 27.4 03/25/2022 1341   MCH 27.1 07/06/2021 1055   MCHC 31.4 (L) 03/25/2022 1341   MCHC 30.9 (L) 07/06/2021 1055   RDW 13.6 03/25/2022 1341   LYMPHSABS 2.1 03/25/2022 1341   MONOABS 0.6 01/05/2021 1713   EOSABS 0.0 03/25/2022 1341   BASOSABS 0.0 03/25/2022 1341    ASSESSMENT AND PLAN:  There are no diagnoses linked to this encounter.   Patient was given the opportunity to ask questions.  Patient verbalized understanding of the plan and was able to repeat key elements of the plan. Patient was given clear instructions to go to Emergency Department or return to medical center if symptoms don't improve, worsen, or new problems develop.The patient verbalized understanding.   No orders of the defined types were  placed in this encounter.    Requested Prescriptions    No prescriptions requested or ordered in this encounter    No follow-ups on file.  Rema Fendt, NP

## 2022-08-06 ENCOUNTER — Other Ambulatory Visit: Payer: Medicaid Other

## 2022-08-06 DIAGNOSIS — N200 Calculus of kidney: Secondary | ICD-10-CM

## 2022-08-07 LAB — PTH, INTACT AND CALCIUM
Calcium: 8.9 mg/dL (ref 8.7–10.2)
PTH: 44 pg/mL (ref 15–65)

## 2022-08-09 ENCOUNTER — Telehealth: Payer: Self-pay

## 2022-08-09 NOTE — Telephone Encounter (Signed)
Notified pt as advised. Pt states she doesn't want to drop off another UA at this time as she lives in [place] and has been having to take off work for various appts.

## 2022-08-09 NOTE — Telephone Encounter (Signed)
-----   Message from Harle Battiest, PA-C sent at 08/08/2022  4:36 PM EST ----- Please let Theresa Mooney know that her blood work was normal.  I see where she was in the ED recently for urinary issues.  She should come in for a lab visit, so we can check her urine and send it for atypical's.

## 2022-08-10 ENCOUNTER — Ambulatory Visit: Payer: Medicaid Other | Admitting: Family Medicine

## 2022-08-10 DIAGNOSIS — E559 Vitamin D deficiency, unspecified: Secondary | ICD-10-CM

## 2022-08-10 DIAGNOSIS — Z130 Encounter for screening for diseases of the blood and blood-forming organs and certain disorders involving the immune mechanism: Secondary | ICD-10-CM

## 2022-09-07 ENCOUNTER — Encounter: Payer: Self-pay | Admitting: Family Medicine

## 2022-09-07 ENCOUNTER — Ambulatory Visit (INDEPENDENT_AMBULATORY_CARE_PROVIDER_SITE_OTHER): Payer: Medicaid Other | Admitting: Family Medicine

## 2022-09-07 DIAGNOSIS — E559 Vitamin D deficiency, unspecified: Secondary | ICD-10-CM

## 2022-09-07 MED ORDER — VITAMIN D (ERGOCALCIFEROL) 1.25 MG (50000 UNIT) PO CAPS: 50000.0000 [IU] | ORAL_CAPSULE | ORAL | 0 refills | Status: DC

## 2022-09-09 ENCOUNTER — Encounter: Payer: Self-pay | Admitting: Family Medicine

## 2022-09-09 NOTE — Progress Notes (Signed)
Established Patient Office Visit  Subjective    Patient ID: Theresa Mooney, female    DOB: 1981/07/17  Age: 41 y.o. MRN: [id number]  CC:  Chief Complaint  Patient presents with   Medication Refill    HPI Theresa Mooney presents for follow up of vitamin d deficiency. Patient denies acute complaints or concerns.    Outpatient Encounter Medications as of 09/07/2022  Medication Sig   albuterol (VENTOLIN HFA) 108 (90 Base) MCG/ACT inhaler Inhale 2 puffs into the lungs every 6 (six) hours as needed for wheezing or shortness of breath.   Vitamin D, Ergocalciferol, (DRISDOL) 1.25 MG (50000 UNIT) CAPS capsule Take 1 capsule (50,000 Units total) by mouth every 7 (seven) days.   [DISCONTINUED] Boric Acid CRYS Place 600 mg vaginally at bedtime. Use vaginally every night for two weeks then twice a week (Patient not taking: Reported on 06/30/2022)   [DISCONTINUED] Diclofenac Sodium CR 100 MG 24 hr tablet Take by mouth. (Patient not taking: Reported on 06/30/2022)   [DISCONTINUED] omeprazole (PRILOSEC) 20 MG capsule Take 1 capsule (20 mg total) by mouth daily.   [DISCONTINUED] oxybutynin (DITROPAN) 5 MG tablet Take 1 tablet (5 mg total) by mouth 3 (three) times daily. (Patient not taking: Reported on 06/30/2022)   [DISCONTINUED] SUMAtriptan (IMITREX) 50 MG tablet Take 1 tablet (50 mg total) by mouth every 2 (two) hours as needed for migraine. May repeat in 2 hours if headache persists or recurs. (Patient not taking: Reported on 07/02/2020)   [DISCONTINUED] Vitamin D, Ergocalciferol, (DRISDOL) 1.25 MG (50000 UNIT) CAPS capsule Take 1 capsule (50,000 Units total) by mouth every 7 (seven) days.   No facility-administered encounter medications on file as of 09/07/2022.    Past Medical History:  Diagnosis Date   Asthma    Cannabis dependence in remission [facility])    Ectopic pregnancy    Elevated glucose    Herpes 2002   Infection    Migraine    Mixed hyperlipidemia    Nicotine dependence     Trichimoniasis 12-2010   Urinary calculi     Past Surgical History:  Procedure Laterality Date   DILATION AND CURETTAGE OF UTERUS     LAPAROSCOPY  11/27/2011   Procedure: LAPAROSCOPY OPERATIVE;  Surgeon: Mora Bellman, MD;  Location: [facility];  Service: Gynecology;  Laterality: Bilateral;   SALPINGECTOMY     Bilateral for twin ectopic preg.    TUBAL LIGATION  12-05-2010    Family History  Problem Relation Age of Onset   Hypertension Mother    Asthma Mother    Hypertension Sister    Stroke Sister    Breast cancer Maternal Aunt    Diabetes Maternal Grandmother    Heart disease Maternal Grandmother    Hypertension Maternal Grandmother    Diabetes Maternal Grandfather    Heart disease Maternal Grandfather    Hypertension Maternal Grandfather    Hyperlipidemia Daughter    Healthy Son    Healthy Son    Healthy Son    Anesthesia problems Neg Hx     Social History   Socioeconomic History   Marital status: Single    Spouse name: Not on file   Number of children: Not on file   Years of education: Not on file   Highest education level: Not on file  Occupational History   Not on file  Tobacco Use   Smoking status: Former    Types: Cigarettes    Passive exposure: Past   Smokeless tobacco: Never  Vaping Use   Vaping Use: Never used  Substance and Sexual Activity   Alcohol use: Yes    Alcohol/week: 1.0 standard drink of alcohol    Types: 1 Glasses of wine per week    Comment: social   Drug use: Not Currently    Types: Marijuana   Sexual activity: Yes    Birth control/protection: Surgical  Other Topics Concern   Not on file  Social History Narrative   Not on file   Social Determinants of Health   Financial Resource Strain: Not on file  Food Insecurity: Not on file  Transportation Needs: Not on file  Physical Activity: Not on file  Stress: Not on file  Social Connections: Not on file  Intimate Partner Violence: Not on file    Review of Systems  All other  systems reviewed and are negative.       Objective    BP 107/72   Pulse 71   Temp 98.1 F (36.7 C) (Oral)   Resp 16   Wt 227 lb 9.6 oz (103.2 kg)   SpO2 96%   BMI 45.97 kg/m   Physical Exam Vitals and nursing note reviewed.  Constitutional:      General: She is not in acute distress. Cardiovascular:     Rate and Rhythm: Normal rate and regular rhythm.  Pulmonary:     Effort: Pulmonary effort is normal.     Breath sounds: Normal breath sounds.  Neurological:     General: No focal deficit present.     Mental Status: She is alert and oriented to person, place, and time.         Assessment & Plan:   1. Vitamin D deficiency Meds refilled - Vitamin D, Ergocalciferol, (DRISDOL) 1.25 MG (50000 UNIT) CAPS capsule; Take 1 capsule (50,000 Units total) by mouth every 7 (seven) days.  Dispense: 12 capsule; Refill: 0    Return in about 3 months (around 12/07/2022) for follow up.   Theresa Raymond, MD

## 2022-12-14 ENCOUNTER — Emergency Department (HOSPITAL_BASED_OUTPATIENT_CLINIC_OR_DEPARTMENT_OTHER)
Admission: EM | Admit: 2022-12-14 | Discharge: 2022-12-14 | Disposition: A | Discharge status: Home / Self Care | Payer: Medicaid Other | Attending: Emergency Medicine | Admitting: Emergency Medicine

## 2022-12-14 ENCOUNTER — Other Ambulatory Visit: Payer: Self-pay

## 2022-12-14 DIAGNOSIS — R1030 Lower abdominal pain, unspecified: Secondary | ICD-10-CM | POA: Diagnosis present

## 2022-12-14 DIAGNOSIS — J45909 Unspecified asthma, uncomplicated: Secondary | ICD-10-CM | POA: Diagnosis not present

## 2022-12-14 DIAGNOSIS — Z87891 Personal history of nicotine dependence: Secondary | ICD-10-CM | POA: Insufficient documentation

## 2022-12-14 DIAGNOSIS — N3000 Acute cystitis without hematuria: Secondary | ICD-10-CM | POA: Insufficient documentation

## 2022-12-14 LAB — COMPREHENSIVE METABOLIC PANEL
ALT: 10 U/L (ref 0–44)
AST: 16 U/L (ref 15–41)
Albumin: 4.5 g/dL (ref 3.5–5.0)
Alkaline Phosphatase: 93 U/L (ref 38–126)
Anion gap: 9 (ref 5–15)
BUN: 13 mg/dL (ref 6–20)
CO2: 24 mmol/L (ref 22–32)
Calcium: 9.4 mg/dL (ref 8.9–10.3)
Chloride: 103 mmol/L (ref 98–111)
Creatinine, Ser: 0.91 mg/dL (ref 0.44–1.00)
GFR, Estimated: >60 mL/min (ref >60)
Glucose, Bld: 78 mg/dL (ref 70–99)
Potassium: 3.6 mmol/L (ref 3.5–5.1)
Sodium: 136 mmol/L (ref 135–145)
Total Bilirubin: 0.3 mg/dL (ref 0.3–1.2)
Total Protein: 8.1 g/dL (ref 6.5–8.1)

## 2022-12-14 LAB — CBC
HCT: 36.6 % (ref 36.0–46.0)
Hemoglobin: 11.4 g/dL — ABNORMAL LOW (ref 12.0–15.0)
MCH: 26.6 pg (ref 26.0–34.0)
MCHC: 31.1 g/dL (ref 30.0–36.0)
MCV: 85.5 fL (ref 80.0–100.0)
Platelets: 333 10*3/uL (ref 150–400)
RBC: 4.28 MIL/uL (ref 3.87–5.11)
RDW: 14.8 % (ref 11.5–15.5)
WBC: 9 10*3/uL (ref 4.0–10.5)
nRBC: 0 % (ref 0.0–0.2)

## 2022-12-14 LAB — URINALYSIS, ROUTINE W REFLEX MICROSCOPIC
Bilirubin Urine: NEGATIVE
Glucose, UA: NEGATIVE mg/dL
Ketones, ur: NEGATIVE mg/dL
Nitrite: NEGATIVE
RBC / HPF: >50 RBC/hpf (ref 0–5)
Specific Gravity, Urine: 1.022 (ref 1.005–1.030)
WBC, UA: >50 WBC/hpf (ref 0–5)
pH: 5.5 (ref 5.0–8.0)

## 2022-12-14 LAB — PREGNANCY, URINE: Preg Test, Ur: NEGATIVE

## 2022-12-14 LAB — LIPASE, BLOOD: Lipase: 29 U/L (ref 11–51)

## 2022-12-14 MED ORDER — CEPHALEXIN 500 MG PO CAPS: 500.0000 mg | ORAL_CAPSULE | Freq: Three times a day (TID) | ORAL | 0 refills | Status: AC

## 2022-12-14 MED ORDER — CEPHALEXIN 250 MG PO CAPS
500.0000 mg | ORAL_CAPSULE | Freq: Once | ORAL | Status: AC
  Administered 2022-12-14: 500 mg via ORAL
  Filled 2022-12-14: qty 2

## 2022-12-14 NOTE — ED Triage Notes (Signed)
Reports diffuse abd pain, urinary frequency, and diarrhea with onset 0600 this am.

## 2022-12-14 NOTE — ED Provider Notes (Signed)
[facility] Emergency Department Provider Note MRN:  [id number]  Arrival date & time: 12/14/22     Chief Complaint   Abdominal Pain   History of Present Illness   Theresa Mooney is a 42 y.o. year-old female with no pertinent past medical history presenting to the ED with chief complaint of abdominal pain.  Suprapubic pain and burning with urination for the past few days.  No flank pain, no fever.  No vaginal bleeding or discharge.  Feels similar to prior UTIs.  Review of Systems  A thorough review of systems was obtained and all systems are negative except as noted in the HPI and PMH.   Patient's Health History    Past Medical History:  Diagnosis Date   Asthma    Cannabis dependence in remission [facility])    Ectopic pregnancy    Elevated glucose    Herpes 2002   Infection    Migraine    Mixed hyperlipidemia    Nicotine dependence    Trichimoniasis 12-2010   Urinary calculi     Past Surgical History:  Procedure Laterality Date   DILATION AND CURETTAGE OF UTERUS     LAPAROSCOPY  11/27/2011   Procedure: LAPAROSCOPY OPERATIVE;  Surgeon: Mora Bellman, MD;  Location: [facility];  Service: Gynecology;  Laterality: Bilateral;   SALPINGECTOMY     Bilateral for twin ectopic preg.    TUBAL LIGATION  12-05-2010    Family History  Problem Relation Age of Onset   Hypertension Mother    Asthma Mother    Hypertension Sister    Stroke Sister    Breast cancer Maternal Aunt    Diabetes Maternal Grandmother    Heart disease Maternal Grandmother    Hypertension Maternal Grandmother    Diabetes Maternal Grandfather    Heart disease Maternal Grandfather    Hypertension Maternal Grandfather    Hyperlipidemia Daughter    Healthy Son    Healthy Son    Healthy Son    Anesthesia problems Neg Hx     Social History   Socioeconomic History   Marital status: Single    Spouse name: Not on file   Number of children: Not on file   Years of education: Not on file    Highest education level: Not on file  Occupational History   Not on file  Tobacco Use   Smoking status: Former    Types: Cigarettes    Passive exposure: Past   Smokeless tobacco: Never  Vaping Use   Vaping Use: Never used  Substance and Sexual Activity   Alcohol use: Yes    Alcohol/week: 1.0 standard drink of alcohol    Types: 1 Glasses of wine per week    Comment: social   Drug use: Not Currently    Types: Marijuana   Sexual activity: Yes    Birth control/protection: Surgical  Other Topics Concern   Not on file  Social History Narrative   Not on file   Social Determinants of Health   Financial Resource Strain: Not on file  Food Insecurity: Not on file  Transportation Needs: Not on file  Physical Activity: Not on file  Stress: Not on file  Social Connections: Not on file  Intimate Partner Violence: Not on file     Physical Exam   Vitals:   12/14/22 2033  BP: (!) 118/92  Pulse: 85  Resp: 20  Temp: 98.2 F (36.8 C)  SpO2: 100%    CONSTITUTIONAL: Well-appearing, NAD NEURO/PSYCH:  Alert and oriented x 3, no focal deficits EYES:  eyes equal and reactive ENT/NECK:  no LAD, no JVD CARDIO: Regular rate, well-perfused, normal S1 and S2 PULM:  CTAB no wheezing or rhonchi GI/GU:  non-distended, non-tender MSK/SPINE:  No gross deformities, no edema SKIN:  no rash, atraumatic   *Additional and/or pertinent findings included in MDM below  Diagnostic and Interventional Summary    EKG Interpretation  Date/Time:    Ventricular Rate:    PR Interval:    QRS Duration:   QT Interval:    QTC Calculation:   R Axis:     Text Interpretation:         Labs Reviewed  CBC - Abnormal; Notable for the following components:      Result Value   Hemoglobin 11.4 (*)    All other components within normal limits  URINALYSIS, ROUTINE W REFLEX MICROSCOPIC - Abnormal; Notable for the following components:   APPearance HAZY (*)    Hgb urine dipstick LARGE (*)    Protein, ur  TRACE (*)    Leukocytes,Ua LARGE (*)    Bacteria, UA RARE (*)    All other components within normal limits  LIPASE, BLOOD  COMPREHENSIVE METABOLIC PANEL  PREGNANCY, URINE    No orders to display    Medications  cephALEXin (KEFLEX) capsule 500 mg (has no administration in time range)     Procedures  /  Critical Care Procedures  ED Course and Medical Decision Making  Initial Impression and Ddx Well-appearing, abdomen soft and nontender with no rebound guarding or rigidity.  Vital signs normal.  Given history this seems consistent with uncomplicated UTI.  Past medical/surgical history that increases complexity of ED encounter: None  Interpretation of Diagnostics I personally reviewed the laboratory assessment and my interpretation is as follows: No significant blood count or electrolyte disturbance.  Urinalysis with evidence to suggest infection.    Patient Reassessment and Ultimate Disposition/Management     Discharge  Patient management required discussion with the following services or consulting groups:  None  Complexity of Problems Addressed Acute complicated illness or Injury  Additional Data Reviewed and Analyzed Further history obtained from: Past medical history and medications listed in the EMR  Additional Factors Impacting ED Encounter Risk Prescriptions  Barth Kirks. Sedonia Small, [place] [email or web address]  Final Clinical Impressions(s) / ED Diagnoses     ICD-10-CM   1. Acute cystitis without hematuria  N30.00       ED Discharge Orders          Ordered    cephALEXin (KEFLEX) 500 MG capsule  3 times daily        12/14/22 2345             Discharge Instructions Discussed with and Provided to Patient:    Discharge Instructions      You were evaluated in the Emergency Department and after careful evaluation, we did not find any emergent condition requiring admission or further testing in the  hospital.  Your exam/testing today is overall reassuring.  Symptoms likely due to a UTI.  Take the Keflex antibiotic as directed.  Use Tylenol or Motrin for discomfort.  Please return to the Emergency Department if you experience any worsening of your condition.   Thank you for allowing [place] to be a part of your care.      Maudie Flakes, MD 12/14/22 [phone]

## 2022-12-14 NOTE — Discharge Instructions (Signed)
You were evaluated in the Emergency Department and after careful evaluation, we did not find any emergent condition requiring admission or further testing in the hospital.  Your exam/testing today is overall reassuring.  Symptoms likely due to a UTI.  Take the Keflex antibiotic as directed.  Use Tylenol or Motrin for discomfort.  Please return to the Emergency Department if you experience any worsening of your condition.   Thank you for allowing [place] to be a part of your care.

## 2022-12-16 ENCOUNTER — Ambulatory Visit: Payer: Medicaid Other | Admitting: Obstetrics and Gynecology

## 2023-01-27 ENCOUNTER — Ambulatory Visit (INDEPENDENT_AMBULATORY_CARE_PROVIDER_SITE_OTHER): Payer: Medicaid Other | Admitting: Obstetrics and Gynecology

## 2023-01-27 ENCOUNTER — Encounter: Payer: Self-pay | Admitting: Obstetrics and Gynecology

## 2023-01-27 ENCOUNTER — Other Ambulatory Visit (HOSPITAL_COMMUNITY)
Admission: RE | Admit: 2023-01-27 | Discharge: 2023-01-27 | Disposition: A | Discharge status: Home / Self Care | Payer: Medicaid Other | Source: Ambulatory Visit | Attending: Obstetrics and Gynecology | Admitting: Obstetrics and Gynecology

## 2023-01-27 VITALS — BP 120/84 | HR 73 | Ht 59.0 in | Wt 231.0 lb

## 2023-01-27 DIAGNOSIS — R3915 Urgency of urination: Secondary | ICD-10-CM

## 2023-01-27 DIAGNOSIS — Z1339 Encounter for screening examination for other mental health and behavioral disorders: Secondary | ICD-10-CM | POA: Diagnosis not present

## 2023-01-27 NOTE — Progress Notes (Signed)
Pt presents for AXE. Last PAP 03-25-22 Pt c/o urinary urgency.

## 2023-01-27 NOTE — Progress Notes (Signed)
  GYNECOLOGY PROGRESS NOTE  History:  Theresa Mooney is a 42 y.o. [id number] presents to [facility] office today for problem gyn visit. She reports on-going urinary urgency. This is managed by [facility]  She denies h/a, dizziness, shortness of breath, n/v, or fever/chills.    The following portions of the patient's history were reviewed and updated as appropriate: allergies, current medications, past family history, past medical history, past social history, past surgical history and problem list. Last pap smear on 03/25/2022 was ASCUS, Negative HRHPV.  Review of Systems:  Pertinent items are noted in HPI.   Objective:  Physical Exam Blood pressure 120/84, pulse 73, height 4\' 11"  (1.499 m), weight 231 lb (104.8 kg). VS reviewed, nursing note reviewed,  Constitutional: well developed, well nourished, no distress HEENT: normocephalic CV: normal rate Pulm/chest wall: normal effort Breast Exam: deferred Abdomen: soft Neuro: alert and oriented x 3 Skin: warm, dry Psych: affect normal Pelvic exam: deferred - swab collected using self-swab technique  Assessment & Plan:  1. Urinary urgency - Explained that vaginal STI screening could r/o STI as cause of urinary complaints - Cervicovaginal ancillary only( Theresa Mooney) - Will notify of results and POC via MyChart messaging system - F/U with Urology office  Total face-to-face time spent during this encounter was 10 minutes. There was 5 minutes of chart review time spent prior to this encounter. Total time spent = 15 minutes.    Theresa Mooney, CNM 1:41 PM

## 2023-01-28 LAB — CERVICOVAGINAL ANCILLARY ONLY
Bacterial Vaginitis (gardnerella): NEGATIVE
Candida Glabrata: NEGATIVE
Candida Vaginitis: NEGATIVE
Chlamydia: NEGATIVE
Comment: NEGATIVE
Comment: NEGATIVE
Comment: NEGATIVE
Comment: NEGATIVE
Comment: NEGATIVE
Comment: NORMAL
Neisseria Gonorrhea: NEGATIVE
Trichomonas: NEGATIVE

## 2023-03-08 ENCOUNTER — Other Ambulatory Visit: Payer: Self-pay | Admitting: Family Medicine

## 2023-03-08 DIAGNOSIS — E559 Vitamin D deficiency, unspecified: Secondary | ICD-10-CM

## 2023-03-08 DIAGNOSIS — J4520 Mild intermittent asthma, uncomplicated: Secondary | ICD-10-CM

## 2023-03-08 NOTE — Telephone Encounter (Signed)
Medication Refill - Medication: albuterol (VENTOLIN HFA) 108 (90 Base) MCG/ACT inhaler and Vitamin D, Ergocalciferol, (DRISDOL) 1.25 MG (50000 UNIT) CAPS capsule  Has the patient contacted their pharmacy? No.  Preferred Pharmacy (with phone number or street name):  [id number] [place], [place] - [address] AT [address] [facility] Phone: [phone]  Fax: [phone]     Has the patient been seen for an appointment in the last year OR does the patient have an upcoming appointment? Yes.    Agent: Please be advised that RX refills may take up to 3 business days. We ask that you follow-up with your pharmacy.

## 2023-03-09 MED ORDER — ALBUTEROL SULFATE HFA 108 (90 BASE) MCG/ACT IN AERS: 2.0000 | INHALATION_SPRAY | Freq: Four times a day (QID) | RESPIRATORY_TRACT | 0 refills | Status: DC | PRN

## 2023-03-09 NOTE — Telephone Encounter (Signed)
Requested Prescriptions  Pending Prescriptions Disp Refills   albuterol (VENTOLIN HFA) 108 (90 Base) MCG/ACT inhaler 18 g 0    Sig: Inhale 2 puffs into the lungs every 6 (six) hours as needed for wheezing or shortness of breath.     Pulmonology:  Beta Agonists 2 Passed - 03/08/2023 10:30 AM      Passed - Last BP in normal range    BP Readings from Last 1 Encounters:  01/27/23 120/84         Passed - Last Heart Rate in normal range    Pulse Readings from Last 1 Encounters:  01/27/23 73         Passed - Valid encounter within last 12 months    Recent Outpatient Visits           6 months ago Vitamin D deficiency   [place] [facility], MD   11 months ago Annual physical exam   [place] [facility], MD   1 year ago Breast pain, right   [place] [facility], MD   1 year ago Mild intermittent asthma without complication   [place] [facility], MD   2 years ago Syncope, unspecified syncope type   [facility], Kandee Keen, MD       Future Appointments             In 1 week Georganna Skeans, MD [facility] at [facility]             Vitamin D, Ergocalciferol, (DRISDOL) 1.25 MG (50000 UNIT) CAPS capsule 12 capsule 0    Sig: Take 1 capsule (50,000 Units total) by mouth every 7 (seven) days.     Endocrinology:  Vitamins - Vitamin D Supplementation 2 Failed - 03/08/2023 10:30 AM      Failed - Manual Review: Route requests for 50,000 IU strength to the provider      Failed - Vitamin D in normal range and within 360 days    Vit D, 25-Hydroxy  Date Value Ref Range Status  07/05/2022 24.9 (L) 30.0 - 100.0 ng/mL Final    Comment:    Vitamin D deficiency has been defined by the Institute of Medicine and an Endocrine Society practice guideline as a level of serum 25-OH vitamin D  less than 20 ng/mL (1,2). The Endocrine Society went on to further define vitamin D insufficiency as a level between 21 and 29 ng/mL (2). 1. IOM (Institute of Medicine). 2010. Dietary reference    intakes for calcium and D. [place]: The    [employer]. 2. Holick MF, Binkley [place], Bischoff-Ferrari HA, et al.    Evaluation, treatment, and prevention of vitamin D    deficiency: an Endocrine Society clinical practice    guideline. JCEM. 2011 Jul; 96(7):1911-30.          Passed - Ca in normal range and within 360 days    Calcium  Date Value Ref Range Status  12/14/2022 9.4 8.9 - 10.3 mg/dL Final         Passed - Valid encounter within last 12 months    Recent Outpatient Visits           6 months ago Vitamin D deficiency    [facility], MD   11 months ago Annual  physical exam   [place] [facility], MD   1 year ago Breast pain, right   [place] [facility], MD   1 year ago Mild intermittent asthma without complication   [place] [facility], MD   2 years ago Syncope, unspecified syncope type   [facility] at [facility], Kandee Keen, MD       Future Appointments             In 1 week Georganna Skeans, MD [facility] at [facility]

## 2023-03-09 NOTE — Telephone Encounter (Signed)
Requested medication (s) are due for refill today: yes  Requested medication (s) are on the active medication list: yes  Last refill:  09/07/22  Future visit scheduled: yes  Notes to clinic:  Manual Review: Route requests for 50,000 IU strength to the provider.     Requested Prescriptions  Pending Prescriptions Disp Refills   Vitamin D, Ergocalciferol, (DRISDOL) 1.25 MG (50000 UNIT) CAPS capsule 12 capsule 0    Sig: Take 1 capsule (50,000 Units total) by mouth every 7 (seven) days.     Endocrinology:  Vitamins - Vitamin D Supplementation 2 Failed - 03/08/2023 10:30 AM      Failed - Manual Review: Route requests for 50,000 IU strength to the provider      Failed - Vitamin D in normal range and within 360 days    Vit D, 25-Hydroxy  Date Value Ref Range Status  07/05/2022 24.9 (L) 30.0 - 100.0 ng/mL Final    Comment:    Vitamin D deficiency has been defined by the Institute of Medicine and an Endocrine Society practice guideline as a level of serum 25-OH vitamin D less than 20 ng/mL (1,2). The Endocrine Society went on to further define vitamin D insufficiency as a level between 21 and 29 ng/mL (2). 1. IOM (Institute of Medicine). 2010. Dietary reference    intakes for calcium and D. [place]: The    [employer]. 2. Holick MF, Binkley [place], Bischoff-Ferrari HA, et al.    Evaluation, treatment, and prevention of vitamin D    deficiency: an Endocrine Society clinical practice    guideline. JCEM. 2011 Jul; 96(7):1911-30.          Passed - Ca in normal range and within 360 days    Calcium  Date Value Ref Range Status  12/14/2022 9.4 8.9 - 10.3 mg/dL Final         Passed - Valid encounter within last 12 months    Recent Outpatient Visits           6 months ago Vitamin D deficiency   [place] [facility], MD   11 months ago Annual physical exam   [place] [facility], MD   1 year ago  Breast pain, right   [place] [facility], MD   1 year ago Mild intermittent asthma without complication   [place] [facility], MD   2 years ago Syncope, unspecified syncope type   [facility] at [facility], Kandee Keen, MD       Future Appointments             In 1 week Georganna Skeans, MD [facility] at [facility]            Signed Prescriptions Disp Refills   albuterol (VENTOLIN HFA) 108 (90 Base) MCG/ACT inhaler 18 g 0    Sig: Inhale 2 puffs into the lungs every 6 (six) hours as needed for wheezing or shortness of breath.     Pulmonology:  Beta Agonists 2 Passed - 03/08/2023 10:30 AM      Passed - Last BP in normal range    BP Readings from Last 1 Encounters:  01/27/23 120/84         Passed - Last Heart Rate in normal range    Pulse Readings from Last 1 Encounters:  01/27/23 73  Passed - Valid encounter within last 12 months    Recent Outpatient Visits           6 months ago Vitamin D deficiency   [place] [facility], MD   11 months ago Annual physical exam   [place] [facility], MD   1 year ago Breast pain, right   [place] [facility], MD   1 year ago Mild intermittent asthma without complication    [facility] at [facility], MD   2 years ago Syncope, unspecified syncope type   [facility], Kandee Keen, MD       Future Appointments             In 1 week Georganna Skeans, MD [facility] at [facility]

## 2023-03-16 ENCOUNTER — Ambulatory Visit (INDEPENDENT_AMBULATORY_CARE_PROVIDER_SITE_OTHER): Payer: Medicaid Other | Admitting: Family Medicine

## 2023-03-16 VITALS — BP 110/71 | HR 89 | Temp 98.1°F | Resp 16 | Wt 228.8 lb

## 2023-03-16 DIAGNOSIS — E66813 Obesity, class 3: Secondary | ICD-10-CM

## 2023-03-16 DIAGNOSIS — Z7689 Persons encountering health services in other specified circumstances: Secondary | ICD-10-CM

## 2023-03-16 DIAGNOSIS — E559 Vitamin D deficiency, unspecified: Secondary | ICD-10-CM

## 2023-03-16 DIAGNOSIS — Z6841 Body Mass Index (BMI) 40.0 and over, adult: Secondary | ICD-10-CM

## 2023-03-16 MED ORDER — PHENTERMINE HCL 37.5 MG PO TABS: 37.5000 mg | ORAL_TABLET | Freq: Every day | ORAL | 0 refills | Status: DC

## 2023-03-17 ENCOUNTER — Encounter: Payer: Self-pay | Admitting: Family Medicine

## 2023-03-17 LAB — VITAMIN D 25 HYDROXY (VIT D DEFICIENCY, FRACTURES): Vit D, 25-Hydroxy: 30.3 ng/mL (ref 30.0–100.0)

## 2023-03-17 NOTE — Progress Notes (Signed)
Established Patient Office Visit  Subjective    Patient ID: Theresa Mooney, female    DOB: 1980/12/29  Age: 42 y.o. MRN: [id number]  CC:  Chief Complaint  Patient presents with   Weight Check    HPI Theresa Mooney presents for weight management. Patient denies acute complaints.    Outpatient Encounter Medications as of 03/16/2023  Medication Sig   phentermine (ADIPEX-P) 37.5 MG tablet Take 1 tablet (37.5 mg total) by mouth daily before breakfast.   albuterol (VENTOLIN HFA) 108 (90 Base) MCG/ACT inhaler Inhale 2 puffs into the lungs every 6 (six) hours as needed for wheezing or shortness of breath. (Patient not taking: Reported on 03/16/2023)   Vitamin D, Ergocalciferol, (DRISDOL) 1.25 MG (50000 UNIT) CAPS capsule Take 1 capsule (50,000 Units total) by mouth every 7 (seven) days. (Patient not taking: Reported on 03/16/2023)   [DISCONTINUED] SUMAtriptan (IMITREX) 50 MG tablet Take 1 tablet (50 mg total) by mouth every 2 (two) hours as needed for migraine. May repeat in 2 hours if headache persists or recurs. (Patient not taking: Reported on 07/02/2020)   No facility-administered encounter medications on file as of 03/16/2023.    Past Medical History:  Diagnosis Date   Asthma    Cannabis dependence in remission [facility])    Ectopic pregnancy    Elevated glucose    Herpes 2002   Infection    Migraine    Mixed hyperlipidemia    Nicotine dependence    Trichimoniasis 12-2010   Urinary calculi     Past Surgical History:  Procedure Laterality Date   DILATION AND CURETTAGE OF UTERUS     LAPAROSCOPY  11/27/2011   Procedure: LAPAROSCOPY OPERATIVE;  Surgeon: Catalina Antigua, MD;  Location: [facility];  Service: Gynecology;  Laterality: Bilateral;   SALPINGECTOMY     Bilateral for twin ectopic preg.    TUBAL LIGATION  12-05-2010    Family History  Problem Relation Age of Onset   Hypertension Mother    Asthma Mother    Hypertension Sister    Stroke Sister    Breast cancer Maternal Aunt     Diabetes Maternal Grandmother    Heart disease Maternal Grandmother    Hypertension Maternal Grandmother    Diabetes Maternal Grandfather    Heart disease Maternal Grandfather    Hypertension Maternal Grandfather    Hyperlipidemia Daughter    Healthy Son    Healthy Son    Healthy Son    Anesthesia problems Neg Hx     Social History   Socioeconomic History   Marital status: Single    Spouse name: Not on file   Number of children: Not on file   Years of education: Not on file   Highest education level: Not on file  Occupational History   Not on file  Tobacco Use   Smoking status: Former    Types: Cigarettes    Passive exposure: Past   Smokeless tobacco: Never  Vaping Use   Vaping Use: Never used  Substance and Sexual Activity   Alcohol use: Yes    Alcohol/week: 1.0 standard drink of alcohol    Types: 1 Glasses of wine per week    Comment: social   Drug use: Not Currently    Types: Marijuana   Sexual activity: Yes    Birth control/protection: Surgical  Other Topics Concern   Not on file  Social History Narrative   Not on file   Social Determinants of Corporate investment banker  Strain: Not on file  Food Insecurity: Not on file  Transportation Needs: Not on file  Physical Activity: Not on file  Stress: Not on file  Social Connections: Not on file  Intimate Partner Violence: Not on file    Review of Systems  All other systems reviewed and are negative.       Objective    BP 110/71   Pulse 89   Temp 98.1 F (36.7 C) (Oral)   Resp 16   Wt 228 lb 12.8 oz (103.8 kg)   SpO2 97%   BMI 46.21 kg/m   Physical Exam Vitals and nursing note reviewed.  Constitutional:      General: She is not in acute distress. Cardiovascular:     Rate and Rhythm: Normal rate and regular rhythm.  Pulmonary:     Effort: Pulmonary effort is normal.     Breath sounds: Normal breath sounds.  Abdominal:     Palpations: Abdomen is soft.     Tenderness: There is no  abdominal tenderness.  Neurological:     General: No focal deficit present.     Mental Status: She is alert and oriented to person, place, and time.         Assessment & Plan:   1. Encounter for weight management Phentermine prescribed. Goal is 4-6lbs/mo wt loss.  2. Class 3 severe obesity due to excess calories without serious comorbidity with body mass index (BMI) of 45.0 to 49.9 in adult (HCC)   3. Vitamin D deficiency Monitoring labs ordered - Vitamin D, 25-hydroxy     Return in about 4 weeks (around 04/13/2023) for follow up.   Tommie Raymond, MD

## 2023-04-08 ENCOUNTER — Ambulatory Visit (HOSPITAL_COMMUNITY): Payer: Medicaid Other

## 2023-05-04 ENCOUNTER — Ambulatory Visit: Payer: Self-pay

## 2023-05-04 NOTE — Telephone Encounter (Signed)
Chief Complaint: Urine odor  Symptoms: urine odor, flank pain, lower back pain Frequency: onset yesterday gradually getting worse Pertinent Negatives: Patient denies fever, pain with urination Disposition: [] ED /[x] Urgent Care (no appt availability in office) / [] Appointment(In office/virtual)/ []  [place] [facility]/ [] Home Care/ [] Refused Recommended Disposition /[] [facility] [place] [facility]/ []  Follow-up with PCP Additional Notes: Patient stated she noticed an odor with urination yesterday and she has been having right side flank pain and lower back pain as well. No appointments available in the office. Offered patient an urgent care appointment. Patient declined and stated she would go on Friday. Advised patient if symptoms get worse she should go to urgent care sooner or possibly the ED. Patient verbalized understanding.   Reason for Disposition  Side (flank) or lower back pain present  Answer Assessment - Initial Assessment Questions 1. SYMPTOM: "What's the main symptom you're concerned about?" (e.g., frequency, incontinence)     Urine odor 2. ONSET: "When did the  odor  start?"     yesterday 3. PAIN: "Is there any pain?" If Yes, ask: "How bad is it?" (Scale: 1-10; mild, moderate, severe)     6/10 4. CAUSE: "What do you think is causing the symptoms?"     I feel like it is a UTI  5. OTHER SYMPTOMS: "Do you have any other symptoms?" (e.g., blood in urine, fever, flank pain, pain with urination)     Flank pain, pressure, lower back pain,  Protocols used: Urinary Symptoms-A-AH

## 2023-05-04 NOTE — Telephone Encounter (Signed)
Pt states that she think she has a UTI? Pt states that she is having back pain and side pain, her urine has a loud smell to it. Pt states that she get UTI's all the time and knows the symptoms, she is also feeling pressure. Please advise.    Left message to call back about symptoms.

## 2023-05-06 ENCOUNTER — Ambulatory Visit
Admission: EM | Admit: 2023-05-06 | Discharge: 2023-05-06 | Disposition: A | Discharge status: Home / Self Care | Payer: Medicaid Other | Attending: Internal Medicine | Admitting: Internal Medicine

## 2023-05-06 DIAGNOSIS — R350 Frequency of micturition: Secondary | ICD-10-CM | POA: Diagnosis not present

## 2023-05-06 DIAGNOSIS — Z113 Encounter for screening for infections with a predominantly sexual mode of transmission: Secondary | ICD-10-CM

## 2023-05-06 DIAGNOSIS — Z3202 Encounter for pregnancy test, result negative: Secondary | ICD-10-CM

## 2023-05-06 DIAGNOSIS — R1013 Epigastric pain: Secondary | ICD-10-CM | POA: Diagnosis not present

## 2023-05-06 LAB — POCT URINALYSIS DIP (MANUAL ENTRY)
Bilirubin, UA: NEGATIVE
Glucose, UA: NEGATIVE mg/dL
Ketones, POC UA: NEGATIVE mg/dL
Leukocytes, UA: NEGATIVE
Nitrite, UA: NEGATIVE
Protein Ur, POC: NEGATIVE mg/dL
Spec Grav, UA: 1.025 (ref 1.010–1.025)
Urobilinogen, UA: 0.2 E.U./dL
pH, UA: 5.5 (ref 5.0–8.0)

## 2023-05-06 LAB — POCT URINE PREGNANCY: Preg Test, Ur: NEGATIVE

## 2023-05-06 MED ORDER — ALUMINUM & MAGNESIUM HYDROXIDE 200-200 MG/5ML PO SUSP
20.0000 mL | Freq: Once | ORAL | Status: AC
  Administered 2023-05-06: 20 mL via ORAL

## 2023-05-06 MED ORDER — FAMOTIDINE 20 MG PO TABS: 20.0000 mg | ORAL_TABLET | Freq: Every day | ORAL | 0 refills | Status: DC

## 2023-05-06 MED ORDER — LIDOCAINE VISCOUS HCL 2 % MT SOLN
15.0000 mL | Freq: Once | OROMUCOSAL | Status: AC
  Administered 2023-05-06: 15 mL via OROMUCOSAL

## 2023-05-06 NOTE — ED Provider Notes (Signed)
[name]    CSN: [id number] Arrival date & time: 05/06/23  1642      History   Chief Complaint Chief Complaint  Patient presents with   Back Pain    HPI Theresa Mooney is a 42 y.o. female.   Patient presents with upper epigastric abdominal pain that seems to radiate around to right upper back that has been present for about a week or so.  She does have a history of GERD and states this feels similar.  She takes Prilosec daily and Carafate as needed.  She reports that she did take Carafate which shows some improvement.  Patient also reporting some foul-smelling urine and urinary frequency over the past 3 days.  Denies dysuria, vaginal discharge, hematuria, flank pain, fever, chills.  Reports that she is sexually active and has had unprotected intercourse but denies exposure to STD.  Last menstrual cycle was around 04/23/2023. Reports mild nausea without vomiting and is having normal BM's with no blood in stool.    Back Pain   Past Medical History:  Diagnosis Date   Asthma    Cannabis dependence in remission [facility])    Ectopic pregnancy    Elevated glucose    Herpes 2002   Infection    Migraine    Mixed hyperlipidemia    Nicotine dependence    Trichimoniasis 12-2010   Urinary calculi     Patient Active Problem List   Diagnosis Date Noted   Bacterial vaginitis 03/19/2021   Pain in both lower extremities 03/17/2021   Migraine without status migrainosus, not intractable 06/23/2020   h/o postpartum BTL  12/13/2011   S/P ectopic pregnancy 12/13/2011    Past Surgical History:  Procedure Laterality Date   DILATION AND CURETTAGE OF UTERUS     LAPAROSCOPY  11/27/2011   Procedure: LAPAROSCOPY OPERATIVE;  Surgeon: Catalina Antigua, MD;  Location: [facility];  Service: Gynecology;  Laterality: Bilateral;   SALPINGECTOMY     Bilateral for twin ectopic preg.    TUBAL LIGATION  12-05-2010    OB History     Gravida  7   Para  5   Term  5   Preterm      AB  2    Living  5      SAB      IAB  1   Ectopic  1   Multiple      Live Births  5            Home Medications    Prior to Admission medications   Medication Sig Start Date End Date Taking? Authorizing Provider  famotidine (PEPCID) 20 MG tablet Take 1 tablet (20 mg total) by mouth at bedtime. 05/06/23  Yes , Acie Fredrickson, FNP  albuterol (VENTOLIN HFA) 108 (90 Base) MCG/ACT inhaler Inhale 2 puffs into the lungs every 6 (six) hours as needed for wheezing or shortness of breath. Patient not taking: Reported on 03/16/2023 03/09/23   Georganna Skeans, MD  phentermine (ADIPEX-P) 37.5 MG tablet Take 1 tablet (37.5 mg total) by mouth daily before breakfast. 03/16/23   Georganna Skeans, MD  Vitamin D, Ergocalciferol, (DRISDOL) 1.25 MG (50000 UNIT) CAPS capsule Take 1 capsule (50,000 Units total) by mouth every 7 (seven) days. Patient not taking: Reported on 03/16/2023 09/07/22   Georganna Skeans, MD  SUMAtriptan (IMITREX) 50 MG tablet Take 1 tablet (50 mg total) by mouth every 2 (two) hours as needed for migraine. May repeat in 2 hours if headache persists  or recurs. Patient not taking: Reported on 07/02/2020 06/23/20 07/29/20  Mayers, Kasandra Knudsen, PA-C    Family History Family History  Problem Relation Age of Onset   Hypertension Mother    Asthma Mother    Hypertension Sister    Stroke Sister    Breast cancer Maternal Aunt    Diabetes Maternal Grandmother    Heart disease Maternal Grandmother    Hypertension Maternal Grandmother    Diabetes Maternal Grandfather    Heart disease Maternal Grandfather    Hypertension Maternal Grandfather    Hyperlipidemia Daughter    Healthy Son    Healthy Son    Healthy Son    Anesthesia problems Neg Hx     Social History Social History   Tobacco Use   Smoking status: Former    Types: Cigarettes    Passive exposure: Past   Smokeless tobacco: Never  Vaping Use   Vaping status: Never Used  Substance Use Topics   Alcohol use: Yes    Alcohol/week: 1.0  standard drink of alcohol    Types: 1 Glasses of wine per week    Comment: social   Drug use: Not Currently    Types: Marijuana     Allergies   Hydrocodone and Penicillins   Review of Systems Review of Systems Per HPI  Physical Exam Triage Vital Signs ED Triage Vitals  Encounter Vitals Group     BP 05/06/23 1800 100/71     Systolic BP Percentile --      Diastolic BP Percentile --      Pulse Rate 05/06/23 1800 89     Resp 05/06/23 1800 16     Temp 05/06/23 1800 98.5 F (36.9 C)     Temp Source 05/06/23 1800 Oral     SpO2 05/06/23 1800 98 %     Weight --      Height --      Head Circumference --      Peak Flow --      Pain Score 05/06/23 1757 7     Pain Loc --      Pain Education --      Exclude from Growth Chart --    No data found.  Updated Vital Signs BP 100/71 (BP Location: Left Arm)   Pulse 89   Temp 98.5 F (36.9 C) (Oral)   Resp 16   LMP 04/23/2023 (Approximate)   SpO2 98%   Visual Acuity Right Eye Distance:   Left Eye Distance:   Bilateral Distance:    Right Eye Near:   Left Eye Near:    Bilateral Near:     Physical Exam Constitutional:      General: She is not in acute distress.    Appearance: Normal appearance. She is not toxic-appearing or diaphoretic.  HENT:     Head: Normocephalic and atraumatic.  Eyes:     Extraocular Movements: Extraocular movements intact.     Conjunctiva/sclera: Conjunctivae normal.  Cardiovascular:     Rate and Rhythm: Normal rate and regular rhythm.     Pulses: Normal pulses.     Heart sounds: Normal heart sounds.  Pulmonary:     Effort: Pulmonary effort is normal. No respiratory distress.     Breath sounds: Normal breath sounds.  Abdominal:     General: Bowel sounds are normal. There is no distension.     Palpations: Abdomen is soft.     Tenderness: There is abdominal tenderness in the epigastric area.  Comments: Mild tenderness to palpation to epigastric area of abdomen.  Genitourinary:    Comments:  Deferred with shared decision making.  Self swab performed. Musculoskeletal:     Comments: No tenderness to palpation to back.  Neurological:     General: No focal deficit present.     Mental Status: She is alert and oriented to person, place, and time. Mental status is at baseline.  Psychiatric:        Mood and Affect: Mood normal.        Behavior: Behavior normal.        Thought Content: Thought content normal.        Judgment: Judgment normal.      UC Treatments / Results  Labs (all labs ordered are listed, but only abnormal results are displayed) Labs Reviewed  POCT URINALYSIS DIP (MANUAL ENTRY) - Abnormal; Notable for the following components:      Result Value   Blood, UA large (*)    All other components within normal limits  URINE CULTURE  CBC  COMPREHENSIVE METABOLIC PANEL  POCT URINE PREGNANCY  CERVICOVAGINAL ANCILLARY ONLY    EKG   Radiology No results found.  Procedures Procedures (including critical care time)  Medications Ordered in UC Medications  aluminum-magnesium hydroxide 200-200 MG/5ML suspension 20 mL (20 mLs Oral Given 05/06/23 1830)  lidocaine (XYLOCAINE) 2 % viscous mouth solution 15 mL (15 mLs Mouth/Throat Given 05/06/23 1830)    Initial Impression / Assessment and Plan / UC Course  I have reviewed the triage vital signs and the nursing notes.  Pertinent labs & imaging results that were available during my care of the patient were reviewed by me and considered in my medical decision making (see chart for details).     Patient was advised to go to the emergency department as CT imaging of the abdomen or ultrasound may be necessary.  She declined going to the ER.  Risks associated with not going to the ER were discussed with patient.  Will do limited workup here in urgent care given patient is declining ER evaluation.  UA was unremarkable.  She does have blood in her UA but she states this is baseline for her and has been told this previously.   Urine pregnancy test negative. Will urine culture to confirm no infection despite urine test being negative given patient is having symptoms.  Cervicovaginal swab also pending.  I suspect that patient's urinary symptoms and abdominal pain are not related given she is not having any lower abdominal pain.  I suspect patient's epigastric pain is due to GERD versus ulcer.  GI cocktail administered in urgent care with improvement in pain.  Patient advised to continue PPI and will add Pepcid to take at night daily.  Advised to avoid spicy and greasy foods and ensure adequate fluid hydration.  Patient was given strict ER precautions and GI specialty contact information for follow-up given persistent GERD despite PPI therapy.  Patient verbalized understanding and was agreeable with plan. Final Clinical Impressions(s) / UC Diagnoses   Final diagnoses:  Epigastric pain  Urinary frequency  Screening examination for venereal disease  Urine pregnancy test negative     Discharge Instructions      I have added Pepcid for you to take at night.  Continue Prilosec.  Blood work, urine culture, vaginal swab are pending.  Will call if they are abnormal.  Please follow-up with gastrointestinal doctor at provided phone number as well.  Go to the ER if symptoms  persist or worsen.  Avoid spicy and greasy foods.     ED Prescriptions     Medication Sig Dispense Auth. Provider   famotidine (PEPCID) 20 MG tablet Take 1 tablet (20 mg total) by mouth at bedtime. 30 tablet [place], Acie Fredrickson, [place]      PDMP not reviewed this encounter.   Gustavus Bryant, [place] 05/06/23 [phone]

## 2023-05-06 NOTE — Discharge Instructions (Signed)
I have added Pepcid for you to take at night.  Continue Prilosec.  Blood work, urine culture, vaginal swab are pending.  Will call if they are abnormal.  Please follow-up with gastrointestinal doctor at provided phone number as well.  Go to the ER if symptoms persist or worsen.  Avoid spicy and greasy foods.

## 2023-05-06 NOTE — ED Triage Notes (Signed)
Pt states lower back and abdominal pain for the past 3 days with foul smelling urine.

## 2023-05-30 ENCOUNTER — Ambulatory Visit
Admission: EM | Admit: 2023-05-30 | Discharge: 2023-05-30 | Disposition: A | Discharge status: Home / Self Care | Payer: Medicaid Other | Attending: Internal Medicine | Admitting: Internal Medicine

## 2023-05-30 DIAGNOSIS — J4520 Mild intermittent asthma, uncomplicated: Secondary | ICD-10-CM

## 2023-05-30 DIAGNOSIS — R059 Cough, unspecified: Secondary | ICD-10-CM | POA: Diagnosis present

## 2023-05-30 DIAGNOSIS — Z1152 Encounter for screening for COVID-19: Secondary | ICD-10-CM | POA: Insufficient documentation

## 2023-05-30 DIAGNOSIS — J069 Acute upper respiratory infection, unspecified: Secondary | ICD-10-CM | POA: Diagnosis present

## 2023-05-30 DIAGNOSIS — J4521 Mild intermittent asthma with (acute) exacerbation: Secondary | ICD-10-CM

## 2023-05-30 MED ORDER — BENZONATATE 100 MG PO CAPS: 100.0000 mg | ORAL_CAPSULE | Freq: Three times a day (TID) | ORAL | 0 refills | Status: DC | PRN

## 2023-05-30 MED ORDER — ALBUTEROL SULFATE HFA 108 (90 BASE) MCG/ACT IN AERS: 2.0000 | INHALATION_SPRAY | Freq: Four times a day (QID) | RESPIRATORY_TRACT | 0 refills | Status: DC | PRN

## 2023-05-30 MED ORDER — DEXAMETHASONE SODIUM PHOSPHATE 10 MG/ML IJ SOLN
10.0000 mg | Freq: Once | INTRAMUSCULAR | Status: AC
  Administered 2023-05-30: 10 mg via INTRAMUSCULAR

## 2023-05-30 NOTE — ED Triage Notes (Signed)
Pt is requesting a refill on her Albuterol inhaler.

## 2023-05-30 NOTE — Discharge Instructions (Signed)
Suspect viral cause to symptoms causing flareup of your asthma.  I refilled your inhaler and given you a steroid shot today.  Cough medication also prescribed.  COVID test is pending.  Follow-up if any symptoms persist or worsen.

## 2023-05-30 NOTE — ED Provider Notes (Signed)
[name]    CSN: [id number] Arrival date & time: 05/30/23  1540      History   Chief Complaint Chief Complaint  Patient presents with   Cough   Nasal Congestion    HPI Theresa Mooney is a 42 y.o. female.   Patient presents with approximately 6 to 7-day history of cough and nasal congestion.  Reports that she does have asthma and has felt like she needed her albuterol inhaler but ran out after she used it 1 time.  States that she has had a productive cough.  Denies fever.  Her children and coworkers have had similar symptoms.  Reports that she took her daughter's leftover prednisone for 1 dose at start of symptoms as well as DayQuil with minimal improvement.   Cough   Past Medical History:  Diagnosis Date   Asthma    Cannabis dependence in remission [facility])    Ectopic pregnancy    Elevated glucose    Herpes 2002   Infection    Migraine    Mixed hyperlipidemia    Nicotine dependence    Trichimoniasis 12-2010   Urinary calculi     Patient Active Problem List   Diagnosis Date Noted   Bacterial vaginitis 03/19/2021   Pain in both lower extremities 03/17/2021   Migraine without status migrainosus, not intractable 06/23/2020   h/o postpartum BTL  12/13/2011   S/P ectopic pregnancy 12/13/2011    Past Surgical History:  Procedure Laterality Date   DILATION AND CURETTAGE OF UTERUS     LAPAROSCOPY  11/27/2011   Procedure: LAPAROSCOPY OPERATIVE;  Surgeon: Catalina Antigua, MD;  Location: [facility];  Service: Gynecology;  Laterality: Bilateral;   SALPINGECTOMY     Bilateral for twin ectopic preg.    TUBAL LIGATION  12-05-2010    OB History     Gravida  7   Para  5   Term  5   Preterm      AB  2   Living  5      SAB      IAB  1   Ectopic  1   Multiple      Live Births  5            Home Medications    Prior to Admission medications   Medication Sig Start Date End Date Taking? Authorizing Provider  benzonatate (TESSALON) 100 MG  capsule Take 1 capsule (100 mg total) by mouth every 8 (eight) hours as needed for cough. 05/30/23  Yes , Rolly Salter E, FNP  famotidine (PEPCID) 20 MG tablet Take 1 tablet (20 mg total) by mouth at bedtime. 05/06/23  Yes , Acie Fredrickson, FNP  phentermine (ADIPEX-P) 37.5 MG tablet Take 1 tablet (37.5 mg total) by mouth daily before breakfast. 03/16/23  Yes Georganna Skeans, MD  Vitamin D, Ergocalciferol, (DRISDOL) 1.25 MG (50000 UNIT) CAPS capsule Take 1 capsule (50,000 Units total) by mouth every 7 (seven) days. 09/07/22  Yes Georganna Skeans, MD  albuterol (VENTOLIN HFA) 108 (90 Base) MCG/ACT inhaler Inhale 2 puffs into the lungs every 6 (six) hours as needed for wheezing or shortness of breath. 05/30/23   Gustavus Bryant, FNP  SUMAtriptan (IMITREX) 50 MG tablet Take 1 tablet (50 mg total) by mouth every 2 (two) hours as needed for migraine. May repeat in 2 hours if headache persists or recurs. Patient not taking: Reported on 07/02/2020 06/23/20 07/29/20  Mayers, Kasandra Knudsen, PA-C    Family History Family History  Problem Relation Age of Onset   Hypertension Mother    Asthma Mother    Hypertension Sister    Stroke Sister    Breast cancer Maternal Aunt    Diabetes Maternal Grandmother    Heart disease Maternal Grandmother    Hypertension Maternal Grandmother    Diabetes Maternal Grandfather    Heart disease Maternal Grandfather    Hypertension Maternal Grandfather    Hyperlipidemia Daughter    Healthy Son    Healthy Son    Healthy Son    Anesthesia problems Neg Hx     Social History Social History   Tobacco Use   Smoking status: Former    Types: Cigarettes    Passive exposure: Past   Smokeless tobacco: Never  Vaping Use   Vaping status: Never Used  Substance Use Topics   Alcohol use: Yes    Alcohol/week: 1.0 standard drink of alcohol    Types: 1 Glasses of wine per week    Comment: social   Drug use: Not Currently    Types: Marijuana     Allergies   Hydrocodone and  Penicillins   Review of Systems Review of Systems Per HPI  Physical Exam Triage Vital Signs ED Triage Vitals [05/30/23 1615]  Encounter Vitals Group     BP 107/73     Systolic BP Percentile      Diastolic BP Percentile      Pulse Rate 85     Resp 16     Temp 98.1 F (36.7 C)     Temp Source Oral     SpO2 98 %     Weight      Height      Head Circumference      Peak Flow      Pain Score      Pain Loc      Pain Education      Exclude from Growth Chart    No data found.  Updated Vital Signs BP 107/73 (BP Location: Left Arm)   Pulse 85   Temp 98.1 F (36.7 C) (Oral)   Resp 16   LMP 05/27/2023 (Approximate)   SpO2 98%   Visual Acuity Right Eye Distance:   Left Eye Distance:   Bilateral Distance:    Right Eye Near:   Left Eye Near:    Bilateral Near:     Physical Exam Constitutional:      General: She is not in acute distress.    Appearance: Normal appearance. She is not toxic-appearing or diaphoretic.  HENT:     Head: Normocephalic and atraumatic.     Right Ear: Ear canal normal. A middle ear effusion is present. Tympanic membrane is not perforated, erythematous or bulging.     Left Ear: Ear canal normal. A middle ear effusion is present. Tympanic membrane is not perforated, erythematous or bulging.     Nose: Congestion present.     Mouth/Throat:     Mouth: Mucous membranes are moist.     Pharynx: Posterior oropharyngeal erythema present.  Eyes:     Extraocular Movements: Extraocular movements intact.     Conjunctiva/sclera: Conjunctivae normal.     Pupils: Pupils are equal, round, and reactive to light.  Cardiovascular:     Rate and Rhythm: Normal rate and regular rhythm.     Pulses: Normal pulses.     Heart sounds: Normal heart sounds.  Pulmonary:     Effort: Pulmonary effort is normal. No respiratory distress.  Breath sounds: Normal breath sounds. No stridor. No wheezing, rhonchi or rales.  Abdominal:     General: Abdomen is flat. Bowel sounds  are normal.     Palpations: Abdomen is soft.  Musculoskeletal:        General: Normal range of motion.     Cervical back: Normal range of motion.  Skin:    General: Skin is warm and dry.  Neurological:     General: No focal deficit present.     Mental Status: She is alert and oriented to person, place, and time. Mental status is at baseline.  Psychiatric:        Mood and Affect: Mood normal.        Behavior: Behavior normal.      UC Treatments / Results  Labs (all labs ordered are listed, but only abnormal results are displayed) Labs Reviewed  SARS CORONAVIRUS 2 (TAT 6-24 HRS)    EKG   Radiology No results found.  Procedures Procedures (including critical care time)  Medications Ordered in UC Medications  dexamethasone (DECADRON) injection 10 mg (10 mg Intramuscular Given 05/30/23 1633)    Initial Impression / Assessment and Plan / UC Course  I have reviewed the triage vital signs and the nursing notes.  Pertinent labs & imaging results that were available during my care of the patient were reviewed by me and considered in my medical decision making (see chart for details).     Patient presents with symptoms likely from a viral upper respiratory infection. Do not suspect underlying cardiopulmonary process.  Suspect possible mild asthma exacerbation.  No signs of tachypnea or adventitious lung sounds on exam so do not think that chest imaging or emergent evaluation is necessary.  Patient is nontoxic appearing and not in need of emergent medical intervention.  Albuterol inhaler refilled.  I do think patient would benefit from steroid therapy.  Patient requesting IM steroid as opposed to oral steroid so IM Decadron administered in urgent care.  Return if symptoms fail to improve.  Patient states understanding and is agreeable.  Discharged with PCP followup.  Final Clinical Impressions(s) / UC Diagnoses   Final diagnoses:  Viral upper respiratory tract infection with  cough  Mild intermittent asthma with acute exacerbation     Discharge Instructions      Suspect viral cause to symptoms causing flareup of your asthma.  I refilled your inhaler and given you a steroid shot today.  Cough medication also prescribed.  COVID test is pending.  Follow-up if any symptoms persist or worsen.    ED Prescriptions     Medication Sig Dispense Auth. Provider   benzonatate (TESSALON) 100 MG capsule Take 1 capsule (100 mg total) by mouth every 8 (eight) hours as needed for cough. 21 capsule [place], [place] E, [place]   albuterol (VENTOLIN HFA) 108 (90 Base) MCG/ACT inhaler Inhale 2 puffs into the lungs every 6 (six) hours as needed for wheezing or shortness of breath. 1 each Gustavus Bryant, [place]      PDMP not reviewed this encounter.   Gustavus Bryant, [place] 05/30/23 [phone]

## 2023-05-30 NOTE — ED Triage Notes (Signed)
Pt reports cough and nasal congestion x 1 week. Pt reports coughing up green phlegm. Pt is eating and drinking well.

## 2023-05-31 ENCOUNTER — Telehealth: Payer: Self-pay | Admitting: Emergency Medicine

## 2023-05-31 DIAGNOSIS — J4520 Mild intermittent asthma, uncomplicated: Secondary | ICD-10-CM

## 2023-05-31 LAB — SARS CORONAVIRUS 2 (TAT 6-24 HRS): SARS Coronavirus 2: NEGATIVE

## 2023-05-31 MED ORDER — BENZONATATE 100 MG PO CAPS: 100.0000 mg | ORAL_CAPSULE | Freq: Three times a day (TID) | ORAL | 0 refills | Status: DC | PRN

## 2023-05-31 MED ORDER — ALBUTEROL SULFATE HFA 108 (90 BASE) MCG/ACT IN AERS: 2.0000 | INHALATION_SPRAY | Freq: Four times a day (QID) | RESPIRATORY_TRACT | 0 refills | Status: DC | PRN

## 2023-09-06 ENCOUNTER — Ambulatory Visit
Admission: EM | Admit: 2023-09-06 | Discharge: 2023-09-06 | Disposition: A | Discharge status: Home / Self Care | Payer: Medicaid Other | Attending: Family Medicine | Admitting: Family Medicine

## 2023-09-06 DIAGNOSIS — R252 Cramp and spasm: Secondary | ICD-10-CM | POA: Diagnosis not present

## 2023-09-06 LAB — POCT FASTING CBG KUC MANUAL ENTRY: POCT Glucose (KUC): 98 mg/dL (ref 70–99)

## 2023-09-06 MED ORDER — CYCLOBENZAPRINE HCL 10 MG PO TABS: ORAL_TABLET | ORAL | 0 refills | Status: DC

## 2023-09-06 NOTE — Discharge Instructions (Signed)
You have had labs (blood tests) sent today. We will call you with any significant abnormalities or if there is need to begin or change treatment or pursue further follow up.  You may also review your test results online through MyChart. If you do not have a MyChart account, instructions to sign up should be on your discharge paperwork.  

## 2023-09-06 NOTE — ED Triage Notes (Signed)
Patient presents with cramping in bilateral legs x 2-3, and left foot, (yesterday) states when she sit down for too long this happens, states she noticed her hands doing the same, reports fatigue since Friday. Treated with mustard.

## 2023-09-07 NOTE — ED Provider Notes (Signed)
[facility]   [id number] 09/06/23 Arrival Time: 1417  ASSESSMENT & PLAN:  1. Muscle cramping    Unclear etiology.  Labs Pending:  TSH  CBC  COMPREHENSIVE METABOLIC PANEL  MAGNESIUM  CALCIUM, IONIZED   Trial of: Discharge Medication List as of 09/06/2023  5:16 PM     START taking these medications   Details  cyclobenzaprine (FLEXERIL) 10 MG tablet Take 1 tablet by mouth 3 times daily as needed for muscle spasm. Warning: May cause drowsiness., Normal       Ensure hydration.   Follow-up Information     Schedule an appointment as soon as possible for a visit  with Georganna Skeans, MD.   Specialty: Family Medicine Why: For follow up. Contact information: [address] [place] [address] [phone]                 Reviewed expectations re: course of current medical issues. Questions answered. Outlined signs and symptoms indicating need for more acute intervention. Understanding verbalized. After Visit Summary given.   SUBJECTIVE: History from: Patient. Paysen DEEKSHA Mooney is a 42 y.o. female. Reports: bilateral leg cramping; few days; yesterday noted than hands were cramping also. Ate 2-3 tbs mustard and this helped. Denies new medications/recent illness/recent injury. Sleeping normally. Normal PO intake without n/v/d. Denies: fever. Normal bowel/bladder habits. No h/o similar. No tx PTA.  OBJECTIVE:  Vitals:   09/06/23 1648 09/06/23 1652 09/06/23 1654  BP:   126/89  Pulse: 85    Resp: 18    Temp: 98.7 F (37.1 C)    TempSrc: Oral    SpO2: 100%    Weight:  104.3 kg   Height:  4\' 11"  (1.499 m)     General appearance: alert; no distress Eyes: PERRLA; EOMI; conjunctivae normal HENT: [place]; AT Neck: supple  CV: RRR Lungs: speaks full sentences without difficulty; unlabored Extremities: no edema Skin: warm and dry Neurologic: normal gait Psychological: alert and cooperative; normal mood and affect  Labs: Results for  orders placed or performed during the hospital encounter of 09/06/23  POCT CBG (manual entry)  Result Value Ref Range   POCT Glucose ([facility]) 98 70 - 99 mg/dL   Labs Reviewed  TSH  CBC  COMPREHENSIVE METABOLIC PANEL  MAGNESIUM  CALCIUM, IONIZED  POCT FASTING CBG [facility] MANUAL ENTRY    Allergies  Allergen Reactions   Hydrocodone Nausea Only    Dizziness   Penicillins     Did it involve swelling of the face/tongue/throat, SOB, or low BP? Y Did it involve sudden or severe rash/hives, skin peeling, or any reaction on the inside of your mouth or nose? N Did you need to seek medical attention at a hospital or doctor's office? Y When did it last happen?  Over 1 month     If all above answers are "NO", may proceed with cephalosporin use.      Past Medical History:  Diagnosis Date   Asthma    Cannabis dependence in remission [facility])    Ectopic pregnancy    Elevated glucose    Herpes 2002   Infection    Migraine    Mixed hyperlipidemia    Nicotine dependence    Trichimoniasis 12-2010   Urinary calculi    Social History   Socioeconomic History   Marital status: Single    Spouse name: Not on file   Number of children: Not on file   Years of education: Not on file   Highest education  level: Not on file  Occupational History   Not on file  Tobacco Use   Smoking status: Former    Types: Cigarettes    Passive exposure: Past   Smokeless tobacco: Never  Vaping Use   Vaping status: Never Used  Substance and Sexual Activity   Alcohol use: Not Currently    Alcohol/week: 1.0 standard drink of alcohol    Types: 1 Glasses of wine per week    Comment: social   Drug use: Not Currently    Types: Marijuana   Sexual activity: Yes    Birth control/protection: Surgical  Other Topics Concern   Not on file  Social History Narrative   Not on file   Social Determinants of Health   Financial Resource Strain: Not on file  Food Insecurity: Not on file  Transportation Needs: Not on file   Physical Activity: Not on file  Stress: Not on file  Social Connections: Unknown (01/25/2022)   Received from [facility], [facility]   Social Network    Social Network: Not on file  Intimate Partner Violence: Unknown (01/01/2022)   Received from [employer], [facility]   HITS    Physically Hurt: Not on file    Insult or Talk Down To: Not on file    Threaten Physical Harm: Not on file    Scream or Curse: Not on file   Family History  Problem Relation Age of Onset   Hypertension Mother    Asthma Mother    Hypertension Sister    Stroke Sister    Breast cancer Maternal Aunt    Diabetes Maternal Grandmother    Heart disease Maternal Grandmother    Hypertension Maternal Grandmother    Diabetes Maternal Grandfather    Heart disease Maternal Grandfather    Hypertension Maternal Grandfather    Hyperlipidemia Daughter    Healthy Son    Healthy Son    Healthy Son    Anesthesia problems Neg Hx    Past Surgical History:  Procedure Laterality Date   DILATION AND CURETTAGE OF UTERUS     LAPAROSCOPY  11/27/2011   Procedure: LAPAROSCOPY OPERATIVE;  Surgeon: Catalina Antigua, MD;  Location: [facility];  Service: Gynecology;  Laterality: Bilateral;   SALPINGECTOMY     Bilateral for twin ectopic preg.    TUBAL LIGATION  12-05-2010     Mardella Layman, MD 09/07/23 [phone]

## 2023-09-08 LAB — CBC
Hematocrit: 36 % (ref 34.0–46.6)
Hemoglobin: 10.9 g/dL — ABNORMAL LOW (ref 11.1–15.9)
MCH: 25.8 pg — ABNORMAL LOW (ref 26.6–33.0)
MCHC: 30.3 g/dL — ABNORMAL LOW (ref 31.5–35.7)
MCV: 85 fL (ref 79–97)
Platelets: 381 10*3/uL (ref 150–450)
RBC: 4.23 x10E6/uL (ref 3.77–5.28)
RDW: 13.8 % (ref 11.7–15.4)
WBC: 6.8 10*3/uL (ref 3.4–10.8)

## 2023-09-08 LAB — COMPREHENSIVE METABOLIC PANEL
ALT: 10 [IU]/L (ref 0–32)
AST: 13 [IU]/L (ref 0–40)
Albumin: 4.2 g/dL (ref 3.9–4.9)
Alkaline Phosphatase: 105 [IU]/L (ref 44–121)
BUN/Creatinine Ratio: 18 (ref 9–23)
BUN: 17 mg/dL (ref 6–20)
Bilirubin Total: 0.2 mg/dL (ref 0.0–1.2)
CO2: 22 mmol/L (ref 20–29)
Calcium: 9.4 mg/dL (ref 8.7–10.2)
Chloride: 102 mmol/L (ref 96–106)
Creatinine, Ser: 0.97 mg/dL (ref 0.57–1.00)
Globulin, Total: 3 g/dL (ref 1.5–4.5)
Glucose: 89 mg/dL (ref 70–99)
Potassium: 4.4 mmol/L (ref 3.5–5.2)
Sodium: 136 mmol/L (ref 134–144)
Total Protein: 7.2 g/dL (ref 6.0–8.5)
eGFR: 76 mL/min/{1.73_m2} (ref >59)

## 2023-09-08 LAB — TSH: TSH: 2.2 u[IU]/mL (ref 0.450–4.500)

## 2023-09-08 LAB — MAGNESIUM: Magnesium: 2.1 mg/dL (ref 1.6–2.3)

## 2023-09-08 LAB — CALCIUM, IONIZED: Calcium, Ion: 5.1 mg/dL (ref 4.5–5.6)

## 2023-10-05 ENCOUNTER — Other Ambulatory Visit: Payer: Self-pay | Admitting: Family Medicine

## 2023-10-05 DIAGNOSIS — Z1231 Encounter for screening mammogram for malignant neoplasm of breast: Secondary | ICD-10-CM

## 2023-10-13 ENCOUNTER — Ambulatory Visit (INDEPENDENT_AMBULATORY_CARE_PROVIDER_SITE_OTHER): Payer: Medicaid Other | Admitting: Family Medicine

## 2023-10-13 ENCOUNTER — Encounter: Payer: Self-pay | Admitting: Family Medicine

## 2023-10-13 ENCOUNTER — Ambulatory Visit: Payer: Self-pay

## 2023-10-13 VITALS — BP 109/72 | HR 93 | Temp 98.0°F | Resp 16 | Ht 59.0 in | Wt 221.0 lb

## 2023-10-13 DIAGNOSIS — D509 Iron deficiency anemia, unspecified: Secondary | ICD-10-CM | POA: Diagnosis not present

## 2023-10-13 DIAGNOSIS — R42 Dizziness and giddiness: Secondary | ICD-10-CM

## 2023-10-13 MED ORDER — IRON (FERROUS SULFATE) 325 (65 FE) MG PO TABS: 325.0000 mg | ORAL_TABLET | Freq: Every day | ORAL | 1 refills | Status: DC

## 2023-10-13 NOTE — Progress Notes (Signed)
Established Patient Office Visit  Subjective    Patient ID: Theresa Mooney, female    DOB: 09/15/1981  Age: 43 y.o. MRN: [id number]  CC:  Chief Complaint  Patient presents with   Dizziness    Shaking, blood feels low, over heated    HPI Theresa Mooney presents for dizziness and lightheadedness. She says that she has been stressed and has not been eating or drinking well. 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   Outpatient Encounter Medications as of 10/13/2023  Medication Sig   albuterol (VENTOLIN HFA) 108 (90 Base) MCG/ACT inhaler Inhale 2 puffs into the lungs every 6 (six) hours as needed for wheezing or shortness of breath.   benzonatate (TESSALON) 100 MG capsule Take 1 capsule (100 mg total) by mouth every 8 (eight) hours as needed for cough.   cyclobenzaprine (FLEXERIL) 10 MG tablet Take 1 tablet by mouth 3 times daily as needed for muscle spasm. Warning: May cause drowsiness.   famotidine (PEPCID) 20 MG tablet Take 1 tablet (20 mg total) by mouth at bedtime.   Iron, Ferrous Sulfate, 325 (65 Fe) MG TABS Take 325 mg by mouth daily.   phentermine (ADIPEX-P) 37.5 MG tablet Take 1 tablet (37.5 mg total) by mouth daily before breakfast.   Vitamin D, Ergocalciferol, (DRISDOL) 1.25 MG (50000 UNIT) CAPS capsule Take 1 capsule (50,000 Units total) by mouth every 7 (seven) days.   [DISCONTINUED] SUMAtriptan (IMITREX) 50 MG tablet Take 1 tablet (50 mg total) by mouth every 2 (two) hours as needed for migraine. May repeat in 2 hours if headache persists or recurs. (Patient not taking: Reported on 07/02/2020)   No facility-administered encounter medications on file as of 10/13/2023.    Past Medical History:  Diagnosis Date   Asthma    Cannabis dependence in remission [facility])    Ectopic pregnancy    Elevated glucose    Herpes 2002   Infection    Migraine    Mixed hyperlipidemia    Nicotine dependence    Trichimoniasis 12-2010   Urinary  calculi     Past Surgical History:  Procedure Laterality Date   DILATION AND CURETTAGE OF UTERUS     LAPAROSCOPY  11/27/2011   Procedure: LAPAROSCOPY OPERATIVE;  Surgeon: Catalina Antigua, MD;  Location: [facility];  Service: Gynecology;  Laterality: Bilateral;   SALPINGECTOMY     Bilateral for twin ectopic preg.    TUBAL LIGATION  12-05-2010    Family History  Problem Relation Age of Onset   Hypertension Mother    Asthma Mother    Hypertension Sister    Stroke Sister    Breast cancer Maternal Aunt    Diabetes Maternal Grandmother    Heart disease Maternal Grandmother    Hypertension Maternal Grandmother    Diabetes Maternal Grandfather    Heart disease Maternal Grandfather    Hypertension Maternal Grandfather    Hyperlipidemia Daughter    Healthy Son    Healthy Son    Healthy Son    Anesthesia problems Neg Hx     Social History   Socioeconomic History   Marital status: Single    Spouse name: Not on file   Number of children: Not on file   Years of education: Not on file   Highest education level: Not on file  Occupational History   Not on file  Tobacco Use   Smoking status: Former    Types: Cigarettes  Passive exposure: Past   Smokeless tobacco: Never  Vaping Use   Vaping status: Never Used  Substance and Sexual Activity   Alcohol use: Not Currently    Alcohol/week: 1.0 standard drink of alcohol    Types: 1 Glasses of wine per week    Comment: social   Drug use: Not Currently    Types: Marijuana   Sexual activity: Yes    Birth control/protection: Surgical  Other Topics Concern   Not on file  Social History Narrative   Not on file   Social Drivers of Health   Financial Resource Strain: Low Risk  (10/13/2023)   Overall Financial Resource Strain (CARDIA)    Difficulty of Paying Living Expenses: Not hard at all  Food Insecurity: No Food Insecurity (10/13/2023)   Hunger Vital Sign    Worried About Running Out of Food in the Last Year: Never true    Ran Out  of Food in the Last Year: Never true  Transportation Needs: No Transportation Needs (10/13/2023)   PRAPARE - Administrator, Civil Service (Medical): No    Lack of Transportation (Non-Medical): No  Physical Activity: Insufficiently Active (10/13/2023)   Exercise Vital Sign    Days of Exercise per Week: 1 day    Minutes of Exercise per Session: 30 min  Stress: Stress Concern Present (10/13/2023)   [employer] of Occupational Health - Occupational Stress Questionnaire    Feeling of Stress : Very much  Social Connections: Moderately Isolated (10/13/2023)   Social Connection and Isolation Panel [NHANES]    Frequency of Communication with Friends and Family: Three times a week    Frequency of Social Gatherings with Friends and Family: Once a week    Attends Religious Services: More than 4 times per year    Active Member of [employer] or Organizations: No    Attends Banker Meetings: Never    Marital Status: Never married  Intimate Partner Violence: Not At Risk (10/13/2023)   Humiliation, Afraid, Rape, and Kick questionnaire    Fear of Current or Ex-Partner: No    Emotionally Abused: No    Physically Abused: No    Sexually Abused: No    Review of Systems  Constitutional:  Negative for chills and fever.  All other systems reviewed and are negative.       Objective    BP 109/72 (BP Location: Right Arm, Patient Position: Sitting, Cuff Size: Large)   Pulse 93   Temp 98 F (36.7 C) (Oral)   Resp 16   Ht 4\' 11"  (1.499 m)   Wt 221 lb (100.2 kg)   SpO2 96%   BMI 44.64 kg/m   Physical Exam Vitals and nursing note reviewed.  Constitutional:      General: She is not in acute distress. HENT:     Nose: Congestion present.  Cardiovascular:     Rate and Rhythm: Normal rate and regular rhythm.  Pulmonary:     Effort: Pulmonary effort is normal.     Breath sounds: Normal breath sounds.  Abdominal:     Palpations: Abdomen is soft.     Tenderness: There is no  abdominal tenderness.  Neurological:     General: No focal deficit present.     Mental Status: She is alert and oriented to person, place, and time.  Psychiatric:        Mood and Affect: Mood normal.         Assessment & Plan:   Dizziness and  giddiness -     Basic metabolic panel -     CBC with Differential/Platelet  Iron deficiency anemia, unspecified iron deficiency anemia type -     CBC with Differential/Platelet  Other orders -     Iron (Ferrous Sulfate); Take 325 mg by mouth daily.  Dispense: 90 tablet; Refill: 1     Return in about 3 months (around 01/11/2024) for follow up.   Tommie Raymond, MD

## 2023-10-13 NOTE — Telephone Encounter (Signed)
     Chief Complaint: Pt. States she got dizzy, shaky at work today. Sent home from work. Asking to be worked in. Warm transfer to Q in the practice. Symptoms: Above Frequency: Today Pertinent Negatives: Patient denies  Disposition: [] ED /[] Urgent Care (no appt availability in office) / [] Appointment(In office/virtual)/ []  [facility]/ [] Home Care/ [] Refused Recommended Disposition /[] [facility]/ [x]  Follow-up with PCP Additional Notes: Warm transfer to Q in the practice.  Reason for Disposition  [1] MODERATE dizziness (e.g., interferes with normal activities) AND [2] has NOT been evaluated by doctor (or NP/PA) for this  (Exception: Dizziness caused by heat exposure, sudden standing, or poor fluid intake.)  Answer Assessment - Initial Assessment Questions 1. DESCRIPTION: "Describe your dizziness."     Dizzy 2. LIGHTHEADED: "Do you feel lightheaded?" (e.g., somewhat faint, woozy, weak upon standing)     Woozy 3. VERTIGO: "Do you feel like either you or the room is spinning or tilting?" (i.e. vertigo)     No 4. SEVERITY: "How bad is it?"  "Do you feel like you are going to faint?" "Can you stand and walk?"   - MILD: Feels slightly dizzy, but walking normally.   - MODERATE: Feels unsteady when walking, but not falling; interferes with normal activities (e.g., school, work).   - SEVERE: Unable to walk without falling, or requires assistance to walk without falling; feels like passing out now.      Mild-moderate 5. ONSET:  "When did the dizziness begin?"     Today 6. AGGRAVATING FACTORS: "Does anything make it worse?" (e.g., standing, change in head position)     Moving around 7. HEART RATE: "Can you tell me your heart rate?" "How many beats in 15 seconds?"  (Note: not all patients can do this)       No 8. CAUSE: "What do you think is causing the dizziness?"     Unsure, not eating well 9. RECURRENT SYMPTOM: "Have you had dizziness before?" If Yes, ask: "When was  the last time?" "What happened that time?"     Yes 10. OTHER SYMPTOMS: "Do you have any other symptoms?" (e.g., fever, chest pain, vomiting, diarrhea, bleeding)       Shaky 11. PREGNANCY: "Is there any chance you are pregnant?" "When was your last menstrual period?"       No  Protocols used: Dizziness - Lightheadedness-A-AH

## 2023-10-14 ENCOUNTER — Telehealth: Payer: Self-pay | Admitting: Family Medicine

## 2023-10-14 LAB — CBC WITH DIFFERENTIAL/PLATELET
Basophils Absolute: 0 10*3/uL (ref 0.0–0.2)
Basos: 1 %
EOS (ABSOLUTE): 0 10*3/uL (ref 0.0–0.4)
Eos: 0 %
Hematocrit: 38.9 % (ref 34.0–46.6)
Hemoglobin: 12.1 g/dL (ref 11.1–15.9)
Immature Grans (Abs): 0 10*3/uL (ref 0.0–0.1)
Immature Granulocytes: 0 %
Lymphocytes Absolute: 2.2 10*3/uL (ref 0.7–3.1)
Lymphs: 47 %
MCH: 26.5 pg — ABNORMAL LOW (ref 26.6–33.0)
MCHC: 31.1 g/dL — ABNORMAL LOW (ref 31.5–35.7)
MCV: 85 fL (ref 79–97)
Monocytes Absolute: 0.3 10*3/uL (ref 0.1–0.9)
Monocytes: 6 %
Neutrophils Absolute: 2.2 10*3/uL (ref 1.4–7.0)
Neutrophils: 46 %
Platelets: 375 10*3/uL (ref 150–450)
RBC: 4.56 x10E6/uL (ref 3.77–5.28)
RDW: 13.3 % (ref 11.7–15.4)
WBC: 4.7 10*3/uL (ref 3.4–10.8)

## 2023-10-14 LAB — BASIC METABOLIC PANEL
BUN/Creatinine Ratio: 12 (ref 9–23)
BUN: 12 mg/dL (ref 6–24)
CO2: 21 mmol/L (ref 20–29)
Calcium: 9.7 mg/dL (ref 8.7–10.2)
Chloride: 100 mmol/L (ref 96–106)
Creatinine, Ser: 0.98 mg/dL (ref 0.57–1.00)
Glucose: 147 mg/dL — ABNORMAL HIGH (ref 70–99)
Potassium: 4.4 mmol/L (ref 3.5–5.2)
Sodium: 138 mmol/L (ref 134–144)
eGFR: 75 mL/min/{1.73_m2} (ref >59)

## 2023-10-14 NOTE — Telephone Encounter (Signed)
I called and spoke to patient made her aware that we will call her when the MD looks and them and put in a recommendation.

## 2023-10-14 NOTE — Telephone Encounter (Signed)
Patient has questions about her lab results. Please f/u with patient

## 2023-10-18 ENCOUNTER — Encounter: Payer: Self-pay | Admitting: Family Medicine

## 2023-10-18 ENCOUNTER — Ambulatory Visit
Admission: RE | Admit: 2023-10-18 | Discharge: 2023-10-18 | Disposition: A | Discharge status: Home / Self Care | Payer: Medicaid Other | Source: Ambulatory Visit

## 2023-10-18 DIAGNOSIS — Z1231 Encounter for screening mammogram for malignant neoplasm of breast: Secondary | ICD-10-CM

## 2023-10-20 ENCOUNTER — Encounter: Payer: Self-pay | Admitting: Emergency Medicine

## 2023-10-20 ENCOUNTER — Ambulatory Visit
Admission: EM | Admit: 2023-10-20 | Discharge: 2023-10-20 | Disposition: A | Discharge status: Home / Self Care | Payer: Medicaid Other | Attending: Internal Medicine | Admitting: Internal Medicine

## 2023-10-20 DIAGNOSIS — N3000 Acute cystitis without hematuria: Secondary | ICD-10-CM | POA: Diagnosis present

## 2023-10-20 DIAGNOSIS — Z113 Encounter for screening for infections with a predominantly sexual mode of transmission: Secondary | ICD-10-CM | POA: Insufficient documentation

## 2023-10-20 DIAGNOSIS — J069 Acute upper respiratory infection, unspecified: Secondary | ICD-10-CM | POA: Insufficient documentation

## 2023-10-20 LAB — POCT URINALYSIS DIP (MANUAL ENTRY)
Bilirubin, UA: NEGATIVE
Glucose, UA: NEGATIVE mg/dL
Ketones, POC UA: NEGATIVE mg/dL
Nitrite, UA: NEGATIVE
Protein Ur, POC: NEGATIVE mg/dL
Spec Grav, UA: 1.01 (ref 1.010–1.025)
Urobilinogen, UA: 0.2 U/dL
pH, UA: 6 (ref 5.0–8.0)

## 2023-10-20 LAB — POC COVID19/FLU A&B COMBO
Covid Antigen, POC: NEGATIVE
Influenza A Antigen, POC: NEGATIVE
Influenza B Antigen, POC: NEGATIVE

## 2023-10-20 MED ORDER — SULFAMETHOXAZOLE-TRIMETHOPRIM 800-160 MG PO TABS: 1.0000 | ORAL_TABLET | Freq: Two times a day (BID) | ORAL | 0 refills | Status: AC

## 2023-10-20 NOTE — ED Triage Notes (Signed)
Pt reports fever, body aches, nasal congestion, and productive cough x4 days. Sore throat x2 days. Requesting flu/covid test. Diarrhea started overnight. 4 total stools, none today. Max temp at home was 101, last night. Pt also reports dysuria and increased frequency x2 days. Pt reports she also wants to do a cyto swab to check for STI and would like to complete blood work for testing.

## 2023-10-21 ENCOUNTER — Telehealth (HOSPITAL_BASED_OUTPATIENT_CLINIC_OR_DEPARTMENT_OTHER): Payer: Self-pay

## 2023-10-21 ENCOUNTER — Ambulatory Visit: Payer: Self-pay

## 2023-10-21 LAB — CERVICOVAGINAL ANCILLARY ONLY
Bacterial Vaginitis (gardnerella): POSITIVE — AB
Candida Glabrata: NEGATIVE
Candida Vaginitis: NEGATIVE
Chlamydia: NEGATIVE
Comment: NEGATIVE
Comment: NEGATIVE
Comment: NEGATIVE
Comment: NEGATIVE
Comment: NEGATIVE
Comment: NORMAL
Neisseria Gonorrhea: NEGATIVE
Trichomonas: NEGATIVE

## 2023-10-21 MED ORDER — CLINDAMYCIN HCL 300 MG PO CAPS: 300.0000 mg | ORAL_CAPSULE | Freq: Two times a day (BID) | ORAL | 0 refills | Status: AC

## 2023-10-21 MED ORDER — METRONIDAZOLE 500 MG PO TABS: 500.0000 mg | ORAL_TABLET | Freq: Two times a day (BID) | ORAL | 0 refills | Status: AC

## 2023-10-21 NOTE — Telephone Encounter (Signed)
Pt requested treatment with Clindamycin as Flagyl gives her a HA.  Sent per protocol.

## 2023-10-21 NOTE — Telephone Encounter (Signed)
Per protocol, pt requires tx with metronidazole. Rx sent to pharmacy on file.

## 2023-10-21 NOTE — Addendum Note (Signed)
Addended by: Warren Danes on: 10/21/2023 03:02 PM   Modules accepted: Orders

## 2023-10-23 LAB — URINE CULTURE: Culture: 20000 — AB

## 2023-10-24 ENCOUNTER — Telehealth: Payer: Self-pay

## 2023-10-24 ENCOUNTER — Telehealth (HOSPITAL_BASED_OUTPATIENT_CLINIC_OR_DEPARTMENT_OTHER): Payer: Self-pay

## 2023-10-24 MED ORDER — CIPROFLOXACIN HCL 500 MG PO TABS: 500.0000 mg | ORAL_TABLET | Freq: Two times a day (BID) | ORAL | 0 refills | Status: AC

## 2023-10-24 NOTE — Telephone Encounter (Signed)
Pt reports she had HA and some throat swelling from Bactrim.  Advised to dc Bactrim, which she had.  Per Harle Stanford, NP, "cipro 500mg  BID x5 days."  Allergies updated.

## 2023-10-24 NOTE — Telephone Encounter (Signed)
Error

## 2023-10-25 NOTE — ED Provider Notes (Signed)
[name]    CSN: [id number] Arrival date & time: 10/20/23  1730      History   Chief Complaint Chief Complaint  Patient presents with   Fever   Cough   Diarrhea   Sore Throat    HPI Theresa Mooney is a 43 y.o. female.   Patient here today for evaluation of fever, body aches, nasal congestion that started 4 days ago.  She reports she has had a sore throat for 2 days.  She did have some diarrhea overnight.  She reports Tmax of 101 last night.  She has had some mild dysuria and frequency as well.  She denies any STD exposure but would like STD screening.  The history is provided by the patient.  Fever Associated symptoms: chills, congestion, cough, diarrhea, dysuria and sore throat   Associated symptoms: no ear pain, no nausea and no vomiting   Cough Associated symptoms: chills, fever and sore throat   Associated symptoms: no ear pain, no eye discharge, no shortness of breath and no wheezing   Diarrhea Associated symptoms: chills and fever   Associated symptoms: no abdominal pain and no vomiting   Sore Throat Pertinent negatives include no abdominal pain and no shortness of breath.    Past Medical History:  Diagnosis Date   Asthma    Cannabis dependence in remission [facility])    Ectopic pregnancy    Elevated glucose    Herpes 2002   Infection    Migraine    Mixed hyperlipidemia    Nicotine dependence    Trichimoniasis 12-2010   Urinary calculi     Patient Active Problem List   Diagnosis Date Noted   Bacterial vaginitis 03/19/2021   Pain in both lower extremities 03/17/2021   Migraine without status migrainosus, not intractable 06/23/2020   Encounter for sterilization 12/13/2011   S/P ectopic pregnancy 12/13/2011    Past Surgical History:  Procedure Laterality Date   DILATION AND CURETTAGE OF UTERUS     LAPAROSCOPY  11/27/2011   Procedure: LAPAROSCOPY OPERATIVE;  Surgeon: Catalina Antigua, MD;  Location: [facility];  Service: Gynecology;  Laterality:  Bilateral;   SALPINGECTOMY     Bilateral for twin ectopic preg.    TUBAL LIGATION  12-05-2010    OB History     Gravida  7   Para  5   Term  5   Preterm      AB  2   Living  5      SAB      IAB  1   Ectopic  1   Multiple      Live Births  5            Home Medications    Prior to Admission medications   Medication Sig Start Date End Date Taking? Authorizing Provider  albuterol (VENTOLIN HFA) 108 (90 Base) MCG/ACT inhaler Inhale 2 puffs into the lungs every 6 (six) hours as needed for wheezing or shortness of breath. 05/31/23  Yes Mound, Acie Fredrickson, FNP  dicyclomine (BENTYL) 10 MG capsule Take 10 mg by mouth 4 (four) times daily -  before meals and at bedtime.   Yes [provider]  sulfamethoxazole-trimethoprim (BACTRIM DS) 800-160 MG tablet Take 1 tablet by mouth 2 (two) times daily for 7 days. 10/20/23 10/27/23 Yes Tomi Bamberger, PA-C  benzonatate (TESSALON) 100 MG capsule Take 1 capsule (100 mg total) by mouth every 8 (eight) hours as needed for cough. Patient not taking:  Reported on 10/20/2023 05/31/23   Gustavus Bryant, FNP  ciprofloxacin (CIPRO) 500 MG tablet Take 1 tablet (500 mg total) by mouth 2 (two) times daily for 5 days. 10/24/23 10/29/23  Garrison, [place] N, FNP  clindamycin (CLEOCIN) 300 MG capsule Take 1 capsule (300 mg total) by mouth in the morning and at bedtime for 7 days. 10/21/23 10/28/23  Zenia Resides, MD  cyclobenzaprine (FLEXERIL) 10 MG tablet Take 1 tablet by mouth 3 times daily as needed for muscle spasm. Warning: May cause drowsiness. Patient not taking: Reported on 10/20/2023 09/06/23   Mardella Layman, MD  famotidine (PEPCID) 20 MG tablet Take 1 tablet (20 mg total) by mouth at bedtime. Patient not taking: Reported on 10/20/2023 05/06/23   Gustavus Bryant, FNP  Iron, Ferrous Sulfate, 325 (65 Fe) MG TABS Take 325 mg by mouth daily. Patient not taking: Reported on 10/20/2023 10/13/23   Georganna Skeans, MD  metroNIDAZOLE (FLAGYL) 500 MG  tablet Take 1 tablet (500 mg total) by mouth 2 (two) times daily for 7 days. 10/21/23 10/28/23  Zenia Resides, MD  phentermine (ADIPEX-P) 37.5 MG tablet Take 1 tablet (37.5 mg total) by mouth daily before breakfast. Patient not taking: Reported on 10/20/2023 03/16/23   Georganna Skeans, MD  Vitamin D, Ergocalciferol, (DRISDOL) 1.25 MG (50000 UNIT) CAPS capsule Take 1 capsule (50,000 Units total) by mouth every 7 (seven) days. Patient not taking: Reported on 10/20/2023 09/07/22   Georganna Skeans, MD  SUMAtriptan (IMITREX) 50 MG tablet Take 1 tablet (50 mg total) by mouth every 2 (two) hours as needed for migraine. May repeat in 2 hours if headache persists or recurs. Patient not taking: Reported on 07/02/2020 06/23/20 07/29/20  Mayers, Kasandra Knudsen, PA-C    Family History Family History  Problem Relation Age of Onset   Hypertension Mother    Asthma Mother    Hypertension Sister    Stroke Sister    Hyperlipidemia Daughter    Breast cancer Paternal Aunt 26 - 52   Diabetes Maternal Grandmother    Heart disease Maternal Grandmother    Hypertension Maternal Grandmother    Diabetes Maternal Grandfather    Heart disease Maternal Grandfather    Hypertension Maternal Grandfather    Healthy Son    Healthy Son    Healthy Son    Anesthesia problems Neg Hx     Social History Social History   Tobacco Use   Smoking status: Former    Types: Cigarettes    Passive exposure: Past   Smokeless tobacco: Never  Vaping Use   Vaping status: Never Used  Substance Use Topics   Alcohol use: Not Currently    Alcohol/week: 1.0 standard drink of alcohol    Types: 1 Glasses of wine per week    Comment: social   Drug use: Not Currently    Types: Marijuana     Allergies   Bactrim [sulfamethoxazole-trimethoprim], Hydrocodone, and Penicillins   Review of Systems Review of Systems  Constitutional:  Positive for chills and fever.  HENT:  Positive for congestion and sore throat. Negative for ear pain.   Eyes:   Negative for discharge and redness.  Respiratory:  Positive for cough. Negative for shortness of breath and wheezing.   Gastrointestinal:  Positive for diarrhea. Negative for abdominal pain, nausea and vomiting.  Genitourinary:  Positive for dysuria and frequency.     Physical Exam Triage Vital Signs ED Triage Vitals  Encounter Vitals Group     BP 10/20/23 1748 102/74  Systolic BP Percentile --      Diastolic BP Percentile --      Pulse Rate 10/20/23 1748 92     Resp 10/20/23 1748 20     Temp 10/20/23 1748 98.4 F (36.9 C)     Temp Source 10/20/23 1748 Oral     SpO2 10/20/23 1747 98 %     Weight --      Height --      Head Circumference --      Peak Flow --      Pain Score 10/20/23 1751 7     Pain Loc --      Pain Education --      Exclude from Growth Chart --    No data found.  Updated Vital Signs BP 102/74 (BP Location: Left Arm)   Pulse 92   Temp 98.4 F (36.9 C) (Oral)   Resp 20   LMP 10/10/2023   SpO2 98%   Visual Acuity Right Eye Distance:   Left Eye Distance:   Bilateral Distance:    Right Eye Near:   Left Eye Near:    Bilateral Near:     Physical Exam Vitals and nursing note reviewed.  Constitutional:      General: She is not in acute distress.    Appearance: Normal appearance. She is not ill-appearing.  HENT:     Head: Normocephalic and atraumatic.     Right Ear: Tympanic membrane normal.     Left Ear: Tympanic membrane normal.     Nose: Congestion present.     Mouth/Throat:     Mouth: Mucous membranes are moist.     Pharynx: No oropharyngeal exudate or posterior oropharyngeal erythema.  Eyes:     Conjunctiva/sclera: Conjunctivae normal.  Cardiovascular:     Rate and Rhythm: Normal rate and regular rhythm.     Heart sounds: Normal heart sounds. No murmur heard. Pulmonary:     Effort: Pulmonary effort is normal. No respiratory distress.     Breath sounds: Normal breath sounds. No wheezing, rhonchi or rales.  Skin:    General: Skin is  warm and dry.  Neurological:     Mental Status: She is alert.  Psychiatric:        Mood and Affect: Mood normal.        Thought Content: Thought content normal.      UC Treatments / Results  Labs (all labs ordered are listed, but only abnormal results are displayed) Labs Reviewed  URINE CULTURE - Abnormal; Notable for the following components:      Result Value   Culture 20,000 COLONIES/mL KLEBSIELLA PNEUMONIAE (*)    Organism ID, Bacteria KLEBSIELLA PNEUMONIAE (*)    All other components within normal limits  POCT URINALYSIS DIP (MANUAL ENTRY) - Abnormal; Notable for the following components:   Clarity, UA cloudy (*)    Blood, UA large (*)    Leukocytes, UA Small (1+) (*)    All other components within normal limits  CERVICOVAGINAL ANCILLARY ONLY - Abnormal; Notable for the following components:   Bacterial Vaginitis (gardnerella) Positive (*)    All other components within normal limits  POC COVID19/FLU A&B COMBO - Normal    EKG   Radiology No results found.  Procedures Procedures (including critical care time)  Medications Ordered in UC Medications - No data to display  Initial Impression / Assessment and Plan / UC Course  I have reviewed the triage vital signs and the nursing notes.  Pertinent labs &  imaging results that were available during my care of the patient were reviewed by me and considered in my medical decision making (see chart for details).    Point-of-care flu and COVID screening negative.  Suspect likely viral etiology of upper respiratory symptoms.  Will treat to cover possible UTI given UA results.  Bactrim prescribed and urine culture ordered.  STD screening also ordered as requested.  Will await results further recommendation.  Encouraged follow-up with any persistent or worsening symptoms.  Final Clinical Impressions(s) / UC Diagnoses   Final diagnoses:  Screening for STD (sexually transmitted disease)  Acute cystitis without hematuria   Acute upper respiratory infection   Discharge Instructions   None    ED Prescriptions     Medication Sig Dispense Auth. Provider   sulfamethoxazole-trimethoprim (BACTRIM DS) 800-160 MG tablet Take 1 tablet by mouth 2 (two) times daily for 7 days. 14 tablet Tomi Bamberger, PA-C      PDMP not reviewed this encounter.   Tomi Bamberger, PA-C 10/25/23 [phone]

## 2023-12-16 ENCOUNTER — Ambulatory Visit (INDEPENDENT_AMBULATORY_CARE_PROVIDER_SITE_OTHER): Admitting: Family

## 2023-12-16 ENCOUNTER — Other Ambulatory Visit (HOSPITAL_COMMUNITY)
Admission: EM | Admit: 2023-12-16 | Discharge: 2023-12-16 | Disposition: A | Discharge status: Home / Self Care | Source: Ambulatory Visit | Attending: Family | Admitting: Family

## 2023-12-16 VITALS — BP 118/84 | HR 80 | Temp 98.5°F | Ht 59.0 in | Wt 229.0 lb

## 2023-12-16 DIAGNOSIS — N898 Other specified noninflammatory disorders of vagina: Secondary | ICD-10-CM

## 2023-12-16 LAB — POCT URINALYSIS DIP (CLINITEK)
Bilirubin, UA: NEGATIVE
Glucose, UA: NEGATIVE mg/dL
Ketones, POC UA: NEGATIVE mg/dL
Leukocytes, UA: NEGATIVE
Nitrite, UA: NEGATIVE
POC PROTEIN,UA: NEGATIVE
Spec Grav, UA: 1.025 (ref 1.010–1.025)
Urobilinogen, UA: 0.2 U/dL
pH, UA: 6.5 (ref 5.0–8.0)

## 2023-12-16 MED ORDER — FLUCONAZOLE 150 MG PO TABS: 150.0000 mg | ORAL_TABLET | ORAL | 0 refills | Status: AC

## 2023-12-16 NOTE — Progress Notes (Signed)
 Patient ID: Theresa Mooney, female    DOB: [birth year]  MRN: [id number]  CC: Vaginal Itching  Subjective: Theresa Mooney is a 43 y.o. female who presents for vaginal itching.   Her concerns today include:  - States vaginal itching and vaginal irritation began shortly after recently taking two antibiotics. States symptoms became worse after menses ended. Denies red flag symptoms. - She plans to follow-up at later date with primary provider for weight management.  Patient Active Problem List   Diagnosis Date Noted   Bacterial vaginitis 03/19/2021   Pain in both lower extremities 03/17/2021   Migraine without status migrainosus, not intractable 06/23/2020   Encounter for sterilization 12/13/2011   S/P ectopic pregnancy 12/13/2011     Current Outpatient Medications on File Prior to Visit  Medication Sig Dispense Refill   albuterol (VENTOLIN HFA) 108 (90 Base) MCG/ACT inhaler Inhale 2 puffs into the lungs every 6 (six) hours as needed for wheezing or shortness of breath. 1 each 0   benzonatate (TESSALON) 100 MG capsule Take 1 capsule (100 mg total) by mouth every 8 (eight) hours as needed for cough. (Patient not taking: Reported on 12/16/2023) 21 capsule 0   cyclobenzaprine (FLEXERIL) 10 MG tablet Take 1 tablet by mouth 3 times daily as needed for muscle spasm. Warning: May cause drowsiness. (Patient not taking: Reported on 12/16/2023) 21 tablet 0   dicyclomine (BENTYL) 10 MG capsule Take 10 mg by mouth 4 (four) times daily -  before meals and at bedtime. (Patient not taking: Reported on 12/16/2023)     famotidine (PEPCID) 20 MG tablet Take 1 tablet (20 mg total) by mouth at bedtime. (Patient not taking: Reported on 12/16/2023) 30 tablet 0   Iron, Ferrous Sulfate, 325 (65 Fe) MG TABS Take 325 mg by mouth daily. (Patient not taking: Reported on 12/16/2023) 90 tablet 1   phentermine (ADIPEX-P) 37.5 MG tablet Take 1 tablet (37.5 mg total) by mouth daily before breakfast. (Patient not taking:  Reported on 12/16/2023) 30 tablet 0   Vitamin D, Ergocalciferol, (DRISDOL) 1.25 MG (50000 UNIT) CAPS capsule Take 1 capsule (50,000 Units total) by mouth every 7 (seven) days. (Patient not taking: Reported on 12/16/2023) 12 capsule 0   [DISCONTINUED] SUMAtriptan (IMITREX) 50 MG tablet Take 1 tablet (50 mg total) by mouth every 2 (two) hours as needed for migraine. May repeat in 2 hours if headache persists or recurs. (Patient not taking: Reported on 07/02/2020) 10 tablet 0   No current facility-administered medications on file prior to visit.    Allergies  Allergen Reactions   Bactrim [Sulfamethoxazole-Trimethoprim] Shortness Of Breath and Other (See Comments)   Hydrocodone Nausea Only    Dizziness   Penicillins     Did it involve swelling of the face/tongue/throat, SOB, or low BP? Y Did it involve sudden or severe rash/hives, skin peeling, or any reaction on the inside of your mouth or nose? N Did you need to seek medical attention at a hospital or doctor's office? Y When did it last happen?  Over 1 month     If all above answers are "NO", may proceed with cephalosporin use.      Social History   Socioeconomic History   Marital status: Single    Spouse name: Not on file   Number of children: Not on file   Years of education: Not on file   Highest education level: Not on file  Occupational History   Not on file  Tobacco Use  Smoking status: Former    Types: Cigarettes    Passive exposure: Past   Smokeless tobacco: Never  Vaping Use   Vaping status: Never Used  Substance and Sexual Activity   Alcohol use: Not Currently    Alcohol/week: 1.0 standard drink of alcohol    Types: 1 Glasses of wine per week    Comment: social   Drug use: Not Currently    Types: Marijuana   Sexual activity: Yes    Birth control/protection: Surgical  Other Topics Concern   Not on file  Social History Narrative   Not on file   Social Drivers of Health   Financial Resource Strain: Low Risk   (10/13/2023)   Overall Financial Resource Strain (CARDIA)    Difficulty of Paying Living Expenses: Not hard at all  Food Insecurity: No Food Insecurity (10/13/2023)   Hunger Vital Sign    Worried About Running Out of Food in the Last Year: Never true    Ran Out of Food in the Last Year: Never true  Transportation Needs: No Transportation Needs (10/13/2023)   PRAPARE - Administrator, Civil Service (Medical): No    Lack of Transportation (Non-Medical): No  Physical Activity: Insufficiently Active (10/13/2023)   Exercise Vital Sign    Days of Exercise per Week: 1 day    Minutes of Exercise per Session: 30 min  Stress: Stress Concern Present (10/13/2023)   [employer] of Occupational Health - Occupational Stress Questionnaire    Feeling of Stress : Very much  Social Connections: Moderately Isolated (10/13/2023)   Social Connection and Isolation Panel [NHANES]    Frequency of Communication with Friends and Family: Three times a week    Frequency of Social Gatherings with Friends and Family: Once a week    Attends Religious Services: More than 4 times per year    Active Member of [employer] or Organizations: No    Attends Banker Meetings: Never    Marital Status: Never married  Intimate Partner Violence: Not At Risk (10/13/2023)   Humiliation, Afraid, Rape, and Kick questionnaire    Fear of Current or Ex-Partner: No    Emotionally Abused: No    Physically Abused: No    Sexually Abused: No    Family History  Problem Relation Age of Onset   Hypertension Mother    Asthma Mother    Hypertension Sister    Stroke Sister    Hyperlipidemia Daughter    Breast cancer Paternal Aunt 53 - 85   Diabetes Maternal Grandmother    Heart disease Maternal Grandmother    Hypertension Maternal Grandmother    Diabetes Maternal Grandfather    Heart disease Maternal Grandfather    Hypertension Maternal Grandfather    Healthy Son    Healthy Son    Healthy Son    Anesthesia  problems Neg Hx     Past Surgical History:  Procedure Laterality Date   DILATION AND CURETTAGE OF UTERUS     LAPAROSCOPY  11/27/2011   Procedure: LAPAROSCOPY OPERATIVE;  Surgeon: Catalina Antigua, MD;  Location: [facility];  Service: Gynecology;  Laterality: Bilateral;   SALPINGECTOMY     Bilateral for twin ectopic preg.    TUBAL LIGATION  12-05-2010    ROS: Review of Systems Negative except as stated above  PHYSICAL EXAM: BP 118/84   Pulse 80   Temp 98.5 F (36.9 C) (Oral)   Ht 4\' 11"  (1.499 m)   Wt 229 lb (103.9 kg)  SpO2 95%   BMI 46.25 kg/m   Physical Exam HENT:     Head: Normocephalic and atraumatic.     Nose: Nose normal.     Mouth/Throat:     Mouth: Mucous membranes are moist.     Pharynx: Oropharynx is clear.  Eyes:     Extraocular Movements: Extraocular movements intact.     Conjunctiva/sclera: Conjunctivae normal.     Pupils: Pupils are equal, round, and reactive to light.  Cardiovascular:     Rate and Rhythm: Normal rate and regular rhythm.     Pulses: Normal pulses.     Heart sounds: Normal heart sounds.  Pulmonary:     Effort: Pulmonary effort is normal.     Breath sounds: Normal breath sounds.  Musculoskeletal:        General: Normal range of motion.     Cervical back: Normal range of motion and neck supple.  Neurological:     General: No focal deficit present.     Mental Status: She is alert and oriented to person, place, and time.  Psychiatric:        Mood and Affect: Mood normal.        Behavior: Behavior normal.     ASSESSMENT AND PLAN: 1. Vaginal itching (Primary) 2. Vaginal irritation - Empiric treatment with Fluconazole. Counseled on medication adherence/adverse effects.  - Routine screening.  - Follow-up with primary provider as scheduled. - fluconazole (DIFLUCAN) 150 MG tablet; Take 1 tablet (150 mg total) by mouth every 3 (three) days for 3 doses.  Dispense: 3 tablet; Refill: 0 - Cervicovaginal ancillary only - POCT URINALYSIS DIP  (CLINITEK); Future   Patient was given the opportunity to ask questions.  Patient verbalized understanding of the plan and was able to repeat key elements of the plan. Patient was given clear instructions to go to Emergency Department or return to medical center if symptoms don't improve, worsen, or new problems develop.The patient verbalized understanding.   Orders Placed This Encounter  Procedures   POCT URINALYSIS DIP (CLINITEK)     Requested Prescriptions   Signed Prescriptions Disp Refills   fluconazole (DIFLUCAN) 150 MG tablet 3 tablet 0    Sig: Take 1 tablet (150 mg total) by mouth every 3 (three) days for 3 doses.    Return for Follow-Up or next available with Georganna Skeans, MD.  Rema Fendt, NP

## 2023-12-16 NOTE — Progress Notes (Signed)
 Patient wants to know if she can still get a prescription of phentermine since she didn't pick the last one up.

## 2023-12-19 ENCOUNTER — Encounter: Payer: Self-pay | Admitting: Family

## 2023-12-19 LAB — CERVICOVAGINAL ANCILLARY ONLY
Bacterial Vaginitis (gardnerella): NEGATIVE
Candida Glabrata: NEGATIVE
Candida Vaginitis: NEGATIVE
Chlamydia: NEGATIVE
Comment: NEGATIVE
Comment: NEGATIVE
Comment: NEGATIVE
Comment: NEGATIVE
Comment: NEGATIVE
Comment: NORMAL
Neisseria Gonorrhea: NEGATIVE
Trichomonas: NEGATIVE

## 2024-01-02 ENCOUNTER — Ambulatory Visit: Payer: Medicaid Other | Admitting: Obstetrics and Gynecology

## 2024-01-02 ENCOUNTER — Encounter: Payer: Self-pay | Admitting: Obstetrics and Gynecology

## 2024-01-02 VITALS — Ht 59.0 in | Wt 231.0 lb

## 2024-01-02 DIAGNOSIS — N898 Other specified noninflammatory disorders of vagina: Secondary | ICD-10-CM | POA: Diagnosis not present

## 2024-01-02 DIAGNOSIS — Z01419 Encounter for gynecological examination (general) (routine) without abnormal findings: Secondary | ICD-10-CM

## 2024-01-02 NOTE — Progress Notes (Signed)
 Pt presents for AEX Last PAP 2023 Mammogram 09/2023 Declines STD testing  No concerns

## 2024-01-02 NOTE — Progress Notes (Signed)
 ANNUAL EXAM Patient name: Theresa Mooney MRN [id number]  Date of birth: [birth year] Chief Complaint:   No chief complaint on file.  History of Present Illness:   Theresa Mooney is a 43 y.o. [phone]  female being seen today for a routine annual exam.  Current complaints: Recent mammogram 09/2023. Has been treated for vaginal infection recently, results relief of symptoms, still have odor.  Reports normal periods, no intermenstrual bleeding, will have occasional cramping pain   No LMP recorded. (Menstrual status: Bilateral Tubal Ligation).   The pregnancy intention screening data noted above was reviewed. Potential methods of contraception were discussed. The patient elected to proceed with No data recorded.   Last pap 2023. Results were: ASCUS w/ HRHPV negative. H/O abnormal pap: no Last mammogram: 09/2023. Results were: normal. Family h/o breast cancer: no     10/13/2023    3:26 PM 03/16/2023    1:46 PM 01/28/2023   12:18 PM 09/07/2022    1:33 PM 03/25/2022    8:17 AM  Depression screen PHQ 2/9  Decreased Interest 0 0 3 3 0  Down, Depressed, Hopeless 1 0 0 3 3  PHQ - 2 Score 1 0 3 6 3   Altered sleeping  0 0 3 0  Tired, decreased energy  0 3 3 3   Change in appetite  0 3 3 0  Feeling bad or failure about yourself   0 0  0  Trouble concentrating  0 0  0  Moving slowly or fidgety/restless  0 0 0 0  Suicidal thoughts  0 0 0 0  PHQ-9 Score  0 9 15 6   Difficult doing work/chores  Not difficult at all  Very difficult Not difficult at all        10/13/2023    3:27 PM 03/16/2023    1:46 PM 01/28/2023   12:18 PM 09/07/2022    1:33 PM  GAD 7 : Generalized Anxiety Score  Nervous, Anxious, on Edge 0 0 0 0  Control/stop worrying 0 0 0 0  Worry too much - different things 0 0 2 0  Trouble relaxing 0 0 0 0  Restless 0 0 0 0  Easily annoyed or irritable 0 0 0 0  Afraid - awful might happen 0 0 0 0  Total GAD 7 Score 0 0 2 0  Anxiety Difficulty Not difficult at all Not difficult at all   Not difficult at all     Review of Systems:   Pertinent items are noted in HPI Denies any headaches, blurred vision, fatigue, shortness of breath, chest pain, abdominal pain, abnormal vaginal discharge/itching/odor/irritation, problems with periods, bowel movements, urination, or intercourse unless otherwise stated above. Pertinent History Reviewed:  Reviewed past medical,surgical, social and family history.  Reviewed problem list, medications and allergies. Physical Assessment:   Vitals:   01/02/24 1525  Weight: 231 lb (104.8 kg)  Height: 4\' 11"  (1.499 m)  Body mass index is 46.66 kg/m.        Physical Examination:   General appearance - well ap  Psych:  She has a normal mood and affect  Skin - warm and dry, normal color, no suspicious lesions noted  Chest - effort normal, all lung fields clear to auscultation bilaterally  Heart - normal rate and regular rhythm  Neck:  midline trachea, no thyromegaly or nodules  Breasts - breasts appear normal, no suspicious masses, no skin or nipple changes or  axillary nodes  Abdomen - soft, nontender, nondistended, no  masses or organomegaly  Pelvic - VULVA: normal appearing vulva with no masses, tenderness or lesions  VAGINA: normal appearing vagina with normal color and discharge, no lesions  CERVIX: normal appearing cervix without discharge or lesions, no CMT  UTERUS: uterus is felt to be normal size, shape, consistency and nontender   ADNEXA: No adnexal masses or tenderness noted.  Extremities:  No swelling or varicosities noted  Chaperone present for exam  No results found for this or any previous visit (from the past 24 hours).  Assessment & Plan:  1. Encounter for annual routine gynecological examination (Primary) Encouraged breast exams UTD on mammogram Follows primary care  2. Vaginal discharge  - Cervicovaginal ancillary only( [place])   Labs/procedures today:   Mammogram: in 1 year, or sooner if  problems Colonoscopy: @ 43yo, or sooner if problems  No orders of the defined types were placed in this encounter.   Meds: No orders of the defined types were placed in this encounter.   Follow-up: Return in about 1 year (around 01/01/2025) for Zoanne Hinders, FNP

## 2024-01-02 NOTE — Patient Instructions (Signed)
 At Home Vaginal BV Prevention: If engaging in intercourse without a barrier method, consider using semen sponges afterward. You can buy them at [employer]essentials.com Begin taking a daily probiotic  Use a gentle antibacterial soap on your vulva (never in the vagina), such as Alaffia's Liquid Black Soap with Tea Tree. Avoid scented or harsh soaps. Use a "tea tree tampon" once a month: Melt 1/4c virgin coconut oil, add 1-2 drops of quality tea tree oil.  Soak a tampon in the mixture  Insert overnight the day after your period has ended. 5.   Place a 30mg  boric acid tablet vaginally every night for 21 nights, then weekly for        9 weeks.

## 2024-01-03 ENCOUNTER — Other Ambulatory Visit (HOSPITAL_COMMUNITY)
Admission: RE | Admit: 2024-01-03 | Discharge: 2024-01-03 | Disposition: A | Discharge status: Home / Self Care | Source: Ambulatory Visit | Attending: Obstetrics and Gynecology | Admitting: Obstetrics and Gynecology

## 2024-01-03 DIAGNOSIS — N898 Other specified noninflammatory disorders of vagina: Secondary | ICD-10-CM | POA: Insufficient documentation

## 2024-01-04 LAB — CERVICOVAGINAL ANCILLARY ONLY
Bacterial Vaginitis (gardnerella): NEGATIVE
Candida Glabrata: NEGATIVE
Candida Vaginitis: NEGATIVE
Chlamydia: NEGATIVE
Comment: NEGATIVE
Comment: NEGATIVE
Comment: NEGATIVE
Comment: NEGATIVE
Comment: NEGATIVE
Comment: NORMAL
Neisseria Gonorrhea: NEGATIVE
Trichomonas: NEGATIVE

## 2024-01-11 ENCOUNTER — Ambulatory Visit: Payer: Medicaid Other | Admitting: Family Medicine

## 2024-01-19 ENCOUNTER — Ambulatory Visit: Admitting: Family Medicine

## 2024-01-19 ENCOUNTER — Encounter: Payer: Self-pay | Admitting: Family Medicine

## 2024-01-19 VITALS — BP 116/81 | HR 78 | Wt 227.8 lb

## 2024-01-19 DIAGNOSIS — E66813 Obesity, class 3: Secondary | ICD-10-CM | POA: Diagnosis not present

## 2024-01-19 DIAGNOSIS — Z6841 Body Mass Index (BMI) 40.0 and over, adult: Secondary | ICD-10-CM | POA: Diagnosis not present

## 2024-01-19 DIAGNOSIS — Z7689 Persons encountering health services in other specified circumstances: Secondary | ICD-10-CM

## 2024-01-19 DIAGNOSIS — J4520 Mild intermittent asthma, uncomplicated: Secondary | ICD-10-CM

## 2024-01-19 MED ORDER — PHENTERMINE HCL 37.5 MG PO TABS: 37.5000 mg | ORAL_TABLET | Freq: Every day | ORAL | 0 refills | Status: DC

## 2024-01-19 MED ORDER — ALBUTEROL SULFATE HFA 108 (90 BASE) MCG/ACT IN AERS: 2.0000 | INHALATION_SPRAY | Freq: Four times a day (QID) | RESPIRATORY_TRACT | 0 refills | Status: AC | PRN

## 2024-01-19 NOTE — Progress Notes (Signed)
 Established Patient Office Visit  Subjective    Patient ID: Theresa Mooney, female    DOB: 23-Jun-1981  Age: 43 y.o. MRN: [id number]  CC:  Chief Complaint  Patient presents with   weight magement     Another neurologist referral    bloodwork     Check iron  levels and kidney function    Medication Refill    HPI Theresa Mooney presents for routine weigh loss management.   Outpatient Encounter Medications as of 01/19/2024  Medication Sig   [DISCONTINUED] albuterol  (VENTOLIN  HFA) 108 (90 Base) MCG/ACT inhaler Inhale 2 puffs into the lungs every 6 (six) hours as needed for wheezing or shortness of breath.   albuterol  (VENTOLIN  HFA) 108 (90 Base) MCG/ACT inhaler Inhale 2 puffs into the lungs every 6 (six) hours as needed for wheezing or shortness of breath.   benzonatate  (TESSALON ) 100 MG capsule Take 1 capsule (100 mg total) by mouth every 8 (eight) hours as needed for cough. (Patient not taking: Reported on 01/19/2024)   cyclobenzaprine  (FLEXERIL ) 10 MG tablet Take 1 tablet by mouth 3 times daily as needed for muscle spasm. Warning: May cause drowsiness. (Patient not taking: Reported on 01/19/2024)   dicyclomine (BENTYL) 10 MG capsule Take 10 mg by mouth 4 (four) times daily -  before meals and at bedtime. (Patient not taking: Reported on 12/16/2023)   famotidine  (PEPCID ) 20 MG tablet Take 1 tablet (20 mg total) by mouth at bedtime. (Patient not taking: Reported on 01/19/2024)   Iron , Ferrous Sulfate , 325 (65 Fe) MG TABS Take 325 mg by mouth daily. (Patient not taking: Reported on 01/19/2024)   phentermine  (ADIPEX-P ) 37.5 MG tablet Take 1 tablet (37.5 mg total) by mouth daily before breakfast.   Vitamin D , Ergocalciferol , (DRISDOL ) 1.25 MG (50000 UNIT) CAPS capsule Take 1 capsule (50,000 Units total) by mouth every 7 (seven) days. (Patient not taking: Reported on 01/19/2024)   [DISCONTINUED] phentermine  (ADIPEX-P ) 37.5 MG tablet Take 1 tablet (37.5 mg total) by mouth daily before breakfast.  (Patient not taking: Reported on 01/19/2024)   [DISCONTINUED] SUMAtriptan  (IMITREX ) 50 MG tablet Take 1 tablet (50 mg total) by mouth every 2 (two) hours as needed for migraine. May repeat in 2 hours if headache persists or recurs. (Patient not taking: Reported on 07/02/2020)   No facility-administered encounter medications on file as of 01/19/2024.    Past Medical History:  Diagnosis Date   Asthma    Cannabis dependence in remission [facility])    Ectopic pregnancy    Elevated glucose    Herpes 2002   Infection    Migraine    Mixed hyperlipidemia    Nicotine dependence    Trichimoniasis 12-2010   Urinary calculi     Past Surgical History:  Procedure Laterality Date   DILATION AND CURETTAGE OF UTERUS     LAPAROSCOPY  11/27/2011   Procedure: LAPAROSCOPY OPERATIVE;  Surgeon: Verlyn Goad, MD;  Location: [facility];  Service: Gynecology;  Laterality: Bilateral;   SALPINGECTOMY     Bilateral for twin ectopic preg.    TUBAL LIGATION  12-05-2010    Family History  Problem Relation Age of Onset   Hypertension Mother    Asthma Mother    Hypertension Sister    Stroke Sister    Hyperlipidemia Daughter    Breast cancer Paternal Aunt 41 - 38   Diabetes Maternal Grandmother    Heart disease Maternal Grandmother    Hypertension Maternal Grandmother    Diabetes Maternal Grandfather  Heart disease Maternal Grandfather    Hypertension Maternal Grandfather    Healthy Son    Healthy Son    Healthy Son    Anesthesia problems Neg Hx     Social History   Socioeconomic History   Marital status: Single    Spouse name: Not on file   Number of children: Not on file   Years of education: Not on file   Highest education level: Not on file  Occupational History   Not on file  Tobacco Use   Smoking status: Former    Types: Cigarettes    Passive exposure: Past   Smokeless tobacco: Never  Vaping Use   Vaping status: Never Used  Substance and Sexual Activity   Alcohol use: Not Currently     Alcohol/week: 1.0 standard drink of alcohol    Types: 1 Glasses of wine per week    Comment: social   Drug use: Not Currently    Types: Marijuana   Sexual activity: Yes    Birth control/protection: Surgical  Other Topics Concern   Not on file  Social History Narrative   Not on file   Social Drivers of Health   Financial Resource Strain: Low Risk  (10/13/2023)   Overall Financial Resource Strain (CARDIA)    Difficulty of Paying Living Expenses: Not hard at all  Food Insecurity: No Food Insecurity (10/13/2023)   Hunger Vital Sign    Worried About Running Out of Food in the Last Year: Never true    Ran Out of Food in the Last Year: Never true  Transportation Needs: No Transportation Needs (10/13/2023)   PRAPARE - Administrator, Civil Service (Medical): No    Lack of Transportation (Non-Medical): No  Physical Activity: Insufficiently Active (10/13/2023)   Exercise Vital Sign    Days of Exercise per Week: 1 day    Minutes of Exercise per Session: 30 min  Stress: Stress Concern Present (10/13/2023)   [employer] of Occupational Health - Occupational Stress Questionnaire    Feeling of Stress : Very much  Social Connections: Moderately Isolated (10/13/2023)   Social Connection and Isolation Panel [NHANES]    Frequency of Communication with Friends and Family: Three times a week    Frequency of Social Gatherings with Friends and Family: Once a week    Attends Religious Services: More than 4 times per year    Active Member of [employer] or Organizations: No    Attends Banker Meetings: Never    Marital Status: Never married  Intimate Partner Violence: Not At Risk (10/13/2023)   Humiliation, Afraid, Rape, and Kick questionnaire    Fear of Current or Ex-Partner: No    Emotionally Abused: No    Physically Abused: No    Sexually Abused: No    Review of Systems  All other systems reviewed and are negative.       Objective    BP 116/81 (BP Location: Right  Arm, Patient Position: Sitting, Cuff Size: Large)   Pulse 78   Wt 227 lb 12.8 oz (103.3 kg)   SpO2 96%   BMI 46.01 kg/m   Physical Exam Vitals and nursing note reviewed.  Constitutional:      General: She is not in acute distress. Cardiovascular:     Rate and Rhythm: Normal rate and regular rhythm.  Pulmonary:     Effort: Pulmonary effort is normal.     Breath sounds: Normal breath sounds.  Abdominal:     Palpations:  Abdomen is soft.     Tenderness: There is no abdominal tenderness.  Neurological:     General: No focal deficit present.     Mental Status: She is alert and oriented to person, place, and time.         Assessment & Plan:   Encounter for weight management  Class 3 severe obesity due to excess calories without serious comorbidity with body mass index (BMI) of 45.0 to 49.9 in adult [facility])  Mild intermittent asthma without complication -     Albuterol  Sulfate HFA; Inhale 2 puffs into the lungs every 6 (six) hours as needed for wheezing or shortness of breath.  Dispense: 1 each; Refill: 0  Other orders -     Phentermine  HCl; Take 1 tablet (37.5 mg total) by mouth daily before breakfast.  Dispense: 30 tablet; Refill: 0     No follow-ups on file.   Arlo Lama, MD

## 2024-01-26 ENCOUNTER — Ambulatory Visit: Admitting: Physician Assistant

## 2024-01-31 ENCOUNTER — Ambulatory Visit: Admitting: Physician Assistant

## 2024-01-31 DIAGNOSIS — N200 Calculus of kidney: Secondary | ICD-10-CM

## 2024-02-03 ENCOUNTER — Ambulatory Visit (INDEPENDENT_AMBULATORY_CARE_PROVIDER_SITE_OTHER): Payer: MEDICAID | Admitting: Physician Assistant

## 2024-02-03 ENCOUNTER — Encounter: Payer: Self-pay | Admitting: Physician Assistant

## 2024-02-03 VITALS — BP 110/77 | HR 80 | Ht 59.0 in | Wt 230.8 lb

## 2024-02-03 DIAGNOSIS — M545 Low back pain, unspecified: Secondary | ICD-10-CM | POA: Diagnosis not present

## 2024-02-03 DIAGNOSIS — R3129 Other microscopic hematuria: Secondary | ICD-10-CM | POA: Diagnosis not present

## 2024-02-03 DIAGNOSIS — N200 Calculus of kidney: Secondary | ICD-10-CM | POA: Diagnosis not present

## 2024-02-03 DIAGNOSIS — G8929 Other chronic pain: Secondary | ICD-10-CM | POA: Diagnosis not present

## 2024-02-03 LAB — URINALYSIS, COMPLETE
Bilirubin, UA: NEGATIVE
Glucose, UA: NEGATIVE
Ketones, UA: NEGATIVE
Leukocytes,UA: NEGATIVE
Nitrite, UA: NEGATIVE
Protein,UA: NEGATIVE
Specific Gravity, UA: 1.02 (ref 1.005–1.030)
Urobilinogen, Ur: 0.2 mg/dL (ref 0.2–1.0)
pH, UA: 7 (ref 5.0–7.5)

## 2024-02-03 LAB — MICROSCOPIC EXAMINATION: Epithelial Cells (non renal): >10 /HPF — AB (ref 0–10)

## 2024-02-03 NOTE — Progress Notes (Signed)
 02/03/2024 2:10 PM   Theresa Mooney 08-Jun-1981 [id number]  CC: Chief Complaint  Patient presents with   Follow-up   HPI: Theresa Mooney is a 43 y.o. female with PMH intermediate risk hematuria with benign workup in 2021/2022 and nephrolithiasis who presents today for routine follow-up.   Today she reports no significant changes in her urinary symptoms since her last visit.  She denies dysuria, gross hematuria, or stone episodes.  She has a UTI every few months, with symptoms including dysuria and abdominal pain.  Her symptoms resolved after antibiotics, however most recently she required 2 courses of antibiotics to clear it.  She tends to go to urgent care to treat these.  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  In-office UA today positive for 3+ blood; urine microscopy with 11-30 RBCs/HPF, >10 epithelial cells/hpf, and moderate bacteria.  PMH: Past Medical History:  Diagnosis Date   Asthma    Cannabis dependence in remission [facility])    Ectopic pregnancy    Elevated glucose    Herpes 2002   Infection    Migraine    Mixed hyperlipidemia    Nicotine dependence    Trichimoniasis 12-2010   Urinary calculi     Surgical History: Past Surgical History:  Procedure Laterality Date   DILATION AND CURETTAGE OF UTERUS     LAPAROSCOPY  11/27/2011   Procedure: LAPAROSCOPY OPERATIVE;  Surgeon: Verlyn Goad, MD;  Location: [facility];  Service: Gynecology;  Laterality: Bilateral;   SALPINGECTOMY     Bilateral for twin ectopic preg.    TUBAL LIGATION  12-05-2010    Home Medications:  Allergies as of 02/03/2024       Reactions   Bactrim  [sulfamethoxazole -trimethoprim ] Shortness Of Breath, Other (See Comments)   Hydrocodone  Nausea Only   Dizziness   Penicillins    Did it involve swelling of the face/tongue/throat, SOB, or low BP? Y Did it involve sudden or severe rash/hives, skin peeling, or any reaction on the inside of your mouth  or nose? N Did you need to seek medical attention at a hospital or doctor's office? Y When did it last happen?  Over 1 month     If all above answers are "NO", may proceed with cephalosporin use.        Medication List        Accurate as of Feb 03, 2024  2:10 PM. If you have any questions, ask your nurse or doctor.          albuterol  108 (90 Base) MCG/ACT inhaler Commonly known as: VENTOLIN  HFA Inhale 2 puffs into the lungs every 6 (six) hours as needed for wheezing or shortness of breath.   benzonatate  100 MG capsule Commonly known as: TESSALON  Take 1 capsule (100 mg total) by mouth every 8 (eight) hours as needed for cough.   cyclobenzaprine  10 MG tablet Commonly known as: FLEXERIL  Take 1 tablet by mouth 3 times daily as needed for muscle spasm. Warning: May cause drowsiness.   dicyclomine 10 MG capsule Commonly known as: BENTYL Take 10 mg by mouth 4 (four) times daily -  before meals and at bedtime.   famotidine  20 MG tablet Commonly known as: PEPCID  Take 1 tablet (20 mg total) by mouth at bedtime.   Iron  (Ferrous Sulfate ) 325 (65 Fe) MG Tabs Take 325 mg by mouth daily.   phentermine  37.5 MG tablet Commonly known as: ADIPEX-P  Take 1 tablet (37.5 mg total)  by mouth daily before breakfast.   Vitamin D  (Ergocalciferol ) 1.25 MG (50000 UNIT) Caps capsule Commonly known as: DRISDOL  Take 1 capsule (50,000 Units total) by mouth every 7 (seven) days.        Allergies:  Allergies  Allergen Reactions   Bactrim  [Sulfamethoxazole -Trimethoprim ] Shortness Of Breath and Other (See Comments)   Hydrocodone  Nausea Only    Dizziness   Penicillins     Did it involve swelling of the face/tongue/throat, SOB, or low BP? Y Did it involve sudden or severe rash/hives, skin peeling, or any reaction on the inside of your mouth or nose? N Did you need to seek medical attention at a hospital or doctor's office? Y When did it last happen?  Over 1 month     If all above answers are  "NO", may proceed with cephalosporin use.      Family History: Family History  Problem Relation Age of Onset   Hypertension Mother    Asthma Mother    Hypertension Sister    Stroke Sister    Hyperlipidemia Daughter    Breast cancer Paternal Aunt 88 - 29   Diabetes Maternal Grandmother    Heart disease Maternal Grandmother    Hypertension Maternal Grandmother    Diabetes Maternal Grandfather    Heart disease Maternal Grandfather    Hypertension Maternal Grandfather    Healthy Son    Healthy Son    Healthy Son    Anesthesia problems Neg Hx     Social History:   reports that she has quit smoking. Her smoking use included cigarettes. She has been exposed to tobacco smoke. She has never used smokeless tobacco. She reports that she does not currently use alcohol after a past usage of about 1.0 standard drink of alcohol per week. She reports that she does not currently use drugs after having used the following drugs: Marijuana.  Physical Exam: There were no vitals taken for this visit.  Constitutional:  Alert and oriented, no acute distress, nontoxic appearing HEENT: [place], AT Cardiovascular: No clubbing, cyanosis, or edema Respiratory: Normal respiratory effort, no increased work of breathing Skin: No rashes, bruises or suspicious lesions Neurologic: Grossly intact, no focal deficits, moving all 4 extremities Psychiatric: Normal mood and affect  Laboratory Data: Results for orders placed or performed in visit on 02/03/24  Microscopic Examination   Collection Time: 02/03/24  2:10 PM   Urine  Result Value Ref Range   WBC, UA 0-5 0 - 5 /hpf   RBC, Urine 11-30 (A) 0 - 2 /hpf   Epithelial Cells (non renal) >10 (A) 0 - 10 /hpf   Bacteria, UA Moderate (A) None seen/Few  Urinalysis, Complete   Collection Time: 02/03/24  2:10 PM  Result Value Ref Range   Specific Gravity, UA 1.020 1.005 - 1.030   pH, UA 7.0 5.0 - 7.5   Color, UA Yellow Yellow   Appearance Ur Clear Clear    Leukocytes,UA Negative Negative   Protein,UA Negative Negative/Trace   Glucose, UA Negative Negative   Ketones, UA Negative Negative   RBC, UA 3+ (A) Negative   Bilirubin, UA Negative Negative   Urobilinogen, Ur 0.2 0.2 - 1.0 mg/dL   Nitrite, UA Negative Negative   Microscopic Examination See below:    Assessment & Plan:   1. Microscopic hematuria (Primary) Stable, possibly associated with #2 below.  We discussed indications for repeat hematuria workup including progressive worsening in her bleeding, gross hematuria, UTI symptoms that failed to resolve with antibiotics, or  acute flank pain. - Urinalysis, Complete  2. Kidney stones Numerous bilateral renal stones on prior imaging, will obtain KUB today for stone monitoring and plan for this on an annual basis. - DG Abd 1 View; Future  3. Chronic bilateral low back pain, unspecified whether sciatica present Chronic back pain exacerbated with heavy lifting.  We discussed that kidney pain and back pain can present very similarly, however with more positional/heavy lifting exacerbation this is more consistent with a musculoskeletal etiology.  She has never seen anyone for her back pain, so I am referring her to orthopedics for further evaluation.  She is in agreement with this plan. - AMB referral to orthopedics   Return in about 1 year (around 02/02/2025) for Annual f/u with KUB prior, UA.  Kathreen Pare, PA-C  [facility] [place] [address], [address] [place], [place] [address] [phone]

## 2024-02-21 ENCOUNTER — Ambulatory Visit: Admitting: Physical Medicine and Rehabilitation

## 2024-03-19 ENCOUNTER — Telehealth: Payer: Self-pay | Admitting: Physician Assistant

## 2024-03-19 NOTE — Telephone Encounter (Signed)
 Patient called and states she is currently having symptoms of UTI. Patient states she is having side pain, painful urination and frequent urination and foul odor. Patient states she was told that if she needed any medication to call office as she is known for recurrent UTI's. Please advise patient.

## 2024-03-22 ENCOUNTER — Ambulatory Visit: Admitting: Physician Assistant

## 2024-03-22 NOTE — Telephone Encounter (Signed)
 Pt states she did not come to her appt due to her being at work. Pt states she is still have UTI symptoms but can;t come in because she doesn't have time due to work. Pt was advise to go to urgent care since they are open until 8 or she can call us  to make an appointment when she has time when she has time.

## 2024-04-16 ENCOUNTER — Ambulatory Visit: Payer: Self-pay

## 2024-04-16 NOTE — Telephone Encounter (Signed)
 FYI Only or Action Required?: FYI only for provider.  Patient was last seen in primary care on 01/19/2024 by Theresa Bleacher, MD.  Called Nurse Triage reporting Leg Pain, Foot Pain, Joint Swelling, prior back pain, and urinary symptoms.  Symptoms began yesterday.  Interventions attempted: Rest, hydration, or home remedies.  Symptoms are: gradually worsening.  Triage Disposition: See HCP Within 4 Hours (Or PCP Triage)  Patient/caregiver understands and will follow disposition?: No, refuses disposition     Copied from CRM [id number]. Topic: Clinical - Red Word Triage >> Apr 16, 2024  3:46 PM Carlatta H wrote: [facility] that prompted transfer to Nurse Triage: Patient left leg is swollen and painful for about 2 days Reason for Disposition  [1] Thigh, calf, or ankle swelling AND [2] only 1 side  Answer Assessment - Initial Assessment Questions Pt is poor historian  1. ONSET: When did the swelling start? (e.g., minutes, hours, days)     Pain started today, think swelled up yesterday 2. LOCATION: What part of the leg is swollen?  Are both legs swollen or just one leg?     Aching to leg Swelling to foot Blood vessels on side of foot Whole leg achey down to foot Just foot is swollen 3. SEVERITY: How bad is the swelling? (e.g., localized; mild, moderate, severe)     Puffy, still able to get shoes on, got sandals in Ankle swollen 4. REDNESS: Is there redness or signs of infection?     Foot looks kind of dark but not dark-dark 5. PAIN: Is the swelling painful to touch? If Yes, ask: How painful is it?   (Scale 1-10; mild, moderate or severe)     Sharp pain shooting down the leg to feet Back was hurting Friday night real bad but then stopped hurting 6/10 to leg and foot 6. FEVER: Do you have a fever? If Yes, ask: What is it, how was it measured, and when did it start?      no 7. CAUSE: What do you think is causing the leg swelling?     Don't know if had lot of  salt Never got the antibx for UTI, had to work instead of go to urologist, sometimes and goes, still some symptoms, slightly go away when drink more water, drinking cranberry juice, last night kind of dark then went back to clear, hx of blood in urine anyway 8. MEDICAL HISTORY: Do you have a history of blood clots (e.g., DVT), cancer, heart failure, kidney disease, or liver failure?     no 9. RECURRENT SYMPTOM: Have you had leg swelling before? If Yes, ask: When was the last time? What happened that time?     no 10. OTHER SYMPTOMS: Do you have any other symptoms? (e.g., chest pain, difficulty breathing)       Hmmmm no to chest pain and SOB 11. PREGNANCY: Is there any chance you are pregnant? When was your last menstrual period?       No  Not real warm, about the same temp as other leg though   Advised pt be examined in next 4 hours, pt refusing, no PCP availability today, refusing UC, advised go to UC if worsening swelling, advised hospital if worsening pain or new symptoms like chest pain, SOB, redness, or warmth. Scheduled first available with PCP office which is tomorrow am.  Protocols used: Leg Swelling and Edema-A-AH

## 2024-04-17 ENCOUNTER — Emergency Department (HOSPITAL_BASED_OUTPATIENT_CLINIC_OR_DEPARTMENT_OTHER)

## 2024-04-17 ENCOUNTER — Encounter (HOSPITAL_BASED_OUTPATIENT_CLINIC_OR_DEPARTMENT_OTHER): Payer: Self-pay | Admitting: *Deleted

## 2024-04-17 ENCOUNTER — Encounter: Payer: Self-pay | Admitting: Family

## 2024-04-17 ENCOUNTER — Other Ambulatory Visit (HOSPITAL_COMMUNITY)
Admission: RE | Admit: 2024-04-17 | Discharge: 2024-04-17 | Disposition: A | Discharge status: Home / Self Care | Source: Ambulatory Visit | Attending: Family | Admitting: Family

## 2024-04-17 ENCOUNTER — Ambulatory Visit (INDEPENDENT_AMBULATORY_CARE_PROVIDER_SITE_OTHER): Admitting: Family

## 2024-04-17 ENCOUNTER — Emergency Department (HOSPITAL_BASED_OUTPATIENT_CLINIC_OR_DEPARTMENT_OTHER)
Admission: EM | Admit: 2024-04-17 | Discharge: 2024-04-17 | Disposition: A | Discharge status: Home / Self Care | Attending: Emergency Medicine | Admitting: Emergency Medicine

## 2024-04-17 ENCOUNTER — Ambulatory Visit: Payer: Self-pay | Admitting: Family

## 2024-04-17 ENCOUNTER — Other Ambulatory Visit: Payer: Self-pay

## 2024-04-17 VITALS — BP 107/71 | HR 79 | Temp 98.9°F | Resp 18 | Ht 59.0 in | Wt 230.0 lb

## 2024-04-17 DIAGNOSIS — M79605 Pain in left leg: Secondary | ICD-10-CM | POA: Diagnosis not present

## 2024-04-17 DIAGNOSIS — R350 Frequency of micturition: Secondary | ICD-10-CM | POA: Insufficient documentation

## 2024-04-17 DIAGNOSIS — M79662 Pain in left lower leg: Secondary | ICD-10-CM | POA: Insufficient documentation

## 2024-04-17 DIAGNOSIS — R252 Cramp and spasm: Secondary | ICD-10-CM

## 2024-04-17 DIAGNOSIS — B3731 Acute candidiasis of vulva and vagina: Secondary | ICD-10-CM

## 2024-04-17 LAB — URINALYSIS, ROUTINE W REFLEX MICROSCOPIC
Bilirubin Urine: NEGATIVE
Glucose, UA: NEGATIVE mg/dL
Ketones, ur: NEGATIVE mg/dL
Leukocytes,Ua: NEGATIVE
Nitrite: NEGATIVE
Protein, ur: NEGATIVE mg/dL
Specific Gravity, Urine: 1.017 (ref 1.005–1.030)
pH: 6 (ref 5.0–8.0)

## 2024-04-17 LAB — POCT URINALYSIS DIP (CLINITEK)
Bilirubin, UA: NEGATIVE
Glucose, UA: NEGATIVE mg/dL
Ketones, POC UA: NEGATIVE mg/dL
Leukocytes, UA: NEGATIVE
Nitrite, UA: NEGATIVE
POC PROTEIN,UA: NEGATIVE
Spec Grav, UA: 1.02 (ref 1.010–1.025)
Urobilinogen, UA: 0.2 U/dL
pH, UA: 6 (ref 5.0–8.0)

## 2024-04-17 MED ORDER — CYCLOBENZAPRINE HCL 5 MG PO TABS: 5.0000 mg | ORAL_TABLET | Freq: Every day | ORAL | 0 refills | Status: DC

## 2024-04-17 NOTE — Progress Notes (Signed)
 Patient ID: Theresa Mooney, female    DOB: 1980/11/04  MRN: [id number]  CC: Leg Pain  Subjective: Theresa Mooney is a 43 y.o. female who presents for leg pain.  Her concerns today include:  - Left leg pain for several days. States initially left leg was swollen but has improved since began. States leg cramping especially when sitting for extended amounts of time. She is taking Diclofenac  and using ice packs to help. States left leg pain and swelling may be related to eating fried foods and consuming alcohol over the weekend.  - Frequent urination. Denies red flag symptoms. States she is established with Urology.   Patient Active Problem List   Diagnosis Date Noted   Bacterial vaginitis 03/19/2021   Pain in both lower extremities 03/17/2021   Migraine without status migrainosus, not intractable 06/23/2020   Encounter for sterilization 12/13/2011   S/P ectopic pregnancy 12/13/2011     Current Outpatient Medications on File Prior to Visit  Medication Sig Dispense Refill   albuterol  (VENTOLIN  HFA) 108 (90 Base) MCG/ACT inhaler Inhale 2 puffs into the lungs every 6 (six) hours as needed for wheezing or shortness of breath. 1 each 0   [DISCONTINUED] SUMAtriptan  (IMITREX ) 50 MG tablet Take 1 tablet (50 mg total) by mouth every 2 (two) hours as needed for migraine. May repeat in 2 hours if headache persists or recurs. (Patient not taking: Reported on 07/02/2020) 10 tablet 0   No current facility-administered medications on file prior to visit.    Allergies  Allergen Reactions   Bactrim  [Sulfamethoxazole -Trimethoprim ] Shortness Of Breath and Other (See Comments)   Hydrocodone  Nausea Only    Dizziness   Penicillins     Did it involve swelling of the face/tongue/throat, SOB, or low BP? Y Did it involve sudden or severe rash/hives, skin peeling, or any reaction on the inside of your mouth or nose? N Did you need to seek medical attention at a hospital or doctor's office? Y When did it last  happen?  Over 1 month     If all above answers are NO, may proceed with cephalosporin use.      Social History   Socioeconomic History   Marital status: Single    Spouse name: Not on file   Number of children: Not on file   Years of education: Not on file   Highest education level: Not on file  Occupational History   Not on file  Tobacco Use   Smoking status: Former    Types: Cigarettes    Passive exposure: Past   Smokeless tobacco: Never  Vaping Use   Vaping status: Never Used  Substance and Sexual Activity   Alcohol use: Not Currently    Alcohol/week: 1.0 standard drink of alcohol    Types: 1 Glasses of wine per week    Comment: social   Drug use: Not Currently    Types: Marijuana   Sexual activity: Yes    Birth control/protection: Surgical  Other Topics Concern   Not on file  Social History Narrative   Not on file   Social Drivers of Health   Financial Resource Strain: Low Risk  (10/13/2023)   Overall Financial Resource Strain (CARDIA)    Difficulty of Paying Living Expenses: Not hard at all  Food Insecurity: No Food Insecurity (10/13/2023)   Hunger Vital Sign    Worried About Running Out of Food in the Last Year: Never true    Ran Out of Food in the Last  Year: Never true  Transportation Needs: No Transportation Needs (10/13/2023)   PRAPARE - Administrator, Civil Service (Medical): No    Lack of Transportation (Non-Medical): No  Physical Activity: Insufficiently Active (10/13/2023)   Exercise Vital Sign    Days of Exercise per Week: 1 day    Minutes of Exercise per Session: 30 min  Stress: Stress Concern Present (10/13/2023)   [employer] of Occupational Health - Occupational Stress Questionnaire    Feeling of Stress : Very much  Social Connections: Moderately Isolated (10/13/2023)   Social Connection and Isolation Panel    Frequency of Communication with Friends and Family: Three times a week    Frequency of Social Gatherings with Friends  and Family: Once a week    Attends Religious Services: More than 4 times per year    Active Member of [employer] or Organizations: No    Attends Banker Meetings: Never    Marital Status: Never married  Intimate Partner Violence: Not At Risk (10/13/2023)   Humiliation, Afraid, Rape, and Kick questionnaire    Fear of Current or Ex-Partner: No    Emotionally Abused: No    Physically Abused: No    Sexually Abused: No    Family History  Problem Relation Age of Onset   Hypertension Mother    Asthma Mother    Hypertension Sister    Stroke Sister    Hyperlipidemia Daughter    Breast cancer Paternal Aunt 43 - 15   Diabetes Maternal Grandmother    Heart disease Maternal Grandmother    Hypertension Maternal Grandmother    Diabetes Maternal Grandfather    Heart disease Maternal Grandfather    Hypertension Maternal Grandfather    Healthy Son    Healthy Son    Healthy Son    Anesthesia problems Neg Hx     Past Surgical History:  Procedure Laterality Date   DILATION AND CURETTAGE OF UTERUS     LAPAROSCOPY  11/27/2011   Procedure: LAPAROSCOPY OPERATIVE;  Surgeon: Winton Felt, MD;  Location: [facility];  Service: Gynecology;  Laterality: Bilateral;   SALPINGECTOMY     Bilateral for twin ectopic preg.    TUBAL LIGATION  12-05-2010    ROS: Review of Systems Negative except as stated above  PHYSICAL EXAM: BP 107/71   Pulse 79   Temp 98.9 F (37.2 C) (Oral)   Resp 18   Ht 4' 11 (1.499 m)   Wt 230 lb (104.3 kg)   LMP 04/09/2024 (Approximate)   SpO2 98%   BMI 46.45 kg/m   Physical Exam HENT:     Head: Normocephalic and atraumatic.     Nose: Nose normal.     Mouth/Throat:     Mouth: Mucous membranes are moist.     Pharynx: Oropharynx is clear.  Eyes:     Extraocular Movements: Extraocular movements intact.     Conjunctiva/sclera: Conjunctivae normal.     Pupils: Pupils are equal, round, and reactive to light.  Cardiovascular:     Rate and Rhythm: Normal rate and  regular rhythm.     Pulses: Normal pulses.     Heart sounds: Normal heart sounds.  Pulmonary:     Effort: Pulmonary effort is normal.     Breath sounds: Normal breath sounds.  Musculoskeletal:        General: Normal range of motion.     Cervical back: Normal range of motion and neck supple.     Right hip: Normal.  Left hip: Normal.     Right upper leg: Normal.     Left upper leg: Normal.     Right knee: Normal.     Left knee: Normal.     Right lower leg: Normal.     Left lower leg: Normal.     Right ankle: Normal.     Left ankle: Normal.     Right foot: Normal.     Left foot: Normal.  Neurological:     General: No focal deficit present.     Mental Status: She is alert and oriented to person, place, and time.  Psychiatric:        Mood and Affect: Mood normal.        Behavior: Behavior normal.    ASSESSMENT AND PLAN: 1. Left leg pain (Primary) 2. Leg cramping - Vascular US  Lower Extremity Venous for evaluation.  - Cyclobenzaprine  as prescribed. Counseled on medication adherence/adverse effects.  - Follow-up with primary provider as scheduled.  - cyclobenzaprine  (FLEXERIL ) 5 MG tablet; Take 1 tablet (5 mg total) by mouth at bedtime.  Dispense: 90 tablet; Refill: 0 - VAS US  LOWER EXTREMITY VENOUS (DVT); Future  3. Frequent urination - Routine screening.  - POCT URINALYSIS DIP (CLINITEK); Future - Cervicovaginal ancillary only   Patient was given the opportunity to ask questions.  Patient verbalized understanding of the plan and was able to repeat key elements of the plan. Patient was given clear instructions to go to Emergency Department or return to medical center if symptoms don't improve, worsen, or new problems develop.The patient verbalized understanding.   Orders Placed This Encounter  Procedures   POCT URINALYSIS DIP (CLINITEK)   VAS US  LOWER EXTREMITY VENOUS (DVT)     Requested Prescriptions   Signed Prescriptions Disp Refills   cyclobenzaprine   (FLEXERIL ) 5 MG tablet 90 tablet 0    Sig: Take 1 tablet (5 mg total) by mouth at bedtime.    Return for Follow-Up or next available with Raguel Blush, MD.  Greig JINNY Drones, NP

## 2024-04-17 NOTE — ED Triage Notes (Addendum)
 PT to ED report left leg swelling and pain x 2 days after traveling over the weekend. No redness or warmth noted to leg.   Patient also reporting possible UTI due to foul smelling urine and left sided bladder and flank pain intermittently.

## 2024-04-17 NOTE — ED Notes (Signed)
 RN reviewed discharge instructions with pt. Pt verbalized understanding and had no further questions. VSS upon discharge.

## 2024-04-17 NOTE — Progress Notes (Signed)
 Patient left leg started swelling up Sunday.  UTI symptoms

## 2024-04-17 NOTE — ED Provider Notes (Signed)
 [place] EMERGENCY DEPARTMENT AT [facility] Provider Note   CSN: [id number] Arrival date & time: 04/17/24  1148     Patient presents with: Leg Pain   Theresa Mooney is a 43 y.o. female.   Patient with history of hematuria followed by [facility] health urology in [place], history of UTI -- presents to the emergency department for evaluation of left lower calf pain and cramping starting about 2 days ago.  Patient reports recent travel.  No history of DVT or blood clots.  No chest pain or shortness of breath.  Patient noted some small blood vessels at the base of her ankle.  No fevers or redness.  She states that she called EMS yesterday but was not transported.  No injuries.   Patient also reports concern for continued UTI.  She states that she has had some foul-smelling urine and increased urinary frequency.       Prior to Admission medications   Medication Sig Start Date End Date Taking? Authorizing Provider  albuterol  (VENTOLIN  HFA) 108 (90 Base) MCG/ACT inhaler Inhale 2 puffs into the lungs every 6 (six) hours as needed for wheezing or shortness of breath. 01/19/24   Tanda Bleacher, MD  cyclobenzaprine  (FLEXERIL ) 5 MG tablet Take 1 tablet (5 mg total) by mouth at bedtime. 04/17/24   Lorren Greig JINNY, NP  SUMAtriptan  (IMITREX ) 50 MG tablet Take 1 tablet (50 mg total) by mouth every 2 (two) hours as needed for migraine. May repeat in 2 hours if headache persists or recurs. Patient not taking: Reported on 07/02/2020 06/23/20 07/29/20  Mayers, Kirk RAMAN, PA-C    Allergies: Bactrim  [sulfamethoxazole -trimethoprim ], Hydrocodone , and Penicillins    Review of Systems  Updated Vital Signs BP (!) 140/126 (BP Location: Right Arm)   Pulse 79   Temp 97.9 F (36.6 C)   Resp 16   LMP 04/09/2024 (Approximate)   SpO2 98%   Physical Exam Vitals and nursing note reviewed.  Constitutional:      Appearance: She is well-developed.  HENT:     Head: Normocephalic and atraumatic.  Eyes:      Conjunctiva/sclera: Conjunctivae normal.  Pulmonary:     Effort: No respiratory distress.  Musculoskeletal:        General: No tenderness.     Cervical back: Normal range of motion and neck supple.     Left knee: Normal range of motion. No tenderness.     Right lower leg: No edema.     Left lower leg: No edema.     Left ankle: No tenderness. Normal range of motion.     Comments: Small visible veins noted at the medial ankle, not particularly engorged.  Skin:    General: Skin is warm and dry.  Neurological:     Mental Status: She is alert.     (all labs ordered are listed, but only abnormal results are displayed) Labs Reviewed  URINALYSIS, ROUTINE W REFLEX MICROSCOPIC - Abnormal; Notable for the following components:      Result Value   Hgb urine dipstick MODERATE (*)    Bacteria, UA RARE (*)    All other components within normal limits    EKG: None  Radiology: No results found.   Procedures   Medications Ordered in the ED - No data to display  ED Course  Patient seen and examined. History obtained directly from patient.   Labs/EKG: UA ordered in triage, reviewed, no compelling signs of infection.  Imaging: Ordered ultrasound of the left lower extremity  to evaluate for DVT  Medications/Fluids: None ordered  Most recent vital signs reviewed and are as follows: BP (!) 140/126 (BP Location: Right Arm)   Pulse 79   Temp 97.9 F (36.6 C)   Resp 16   LMP 04/09/2024 (Approximate)   SpO2 98%   Initial impression: Left calf pain, rule out DVT.  3:20 PM Reassessment performed. Patient appears stable, comfortable.  Imaging results reviewed: DVT study, was negative.  Reviewed pertinent lab work and imaging with patient at bedside. Questions answered.   Most current vital signs reviewed and are as follows: BP (!) 124/100   Pulse 71   Temp 97.9 F (36.6 C)   Resp 18   LMP 04/09/2024 (Approximate)   SpO2 100%   Plan: Discharge to home.   Prescriptions  written for: None  Other home care instructions discussed: Elevation, OTC meds.  Patient to follow-up with urology regarding ongoing urinary symptoms.  ED return instructions discussed: Worsening pain, swelling, chest pain, shortness of breath, new or worsening symptoms  Follow-up instructions discussed: Patient encouraged to follow-up with their PCP in 5 days.                                    Medical Decision Making Amount and/or Complexity of Data Reviewed Labs: ordered.   Left lower extremity pain and foot swelling.  Patient evaluated for DVT with ultrasound.  This was negative for DVT.  No signs of cellulitis.  No severe swelling noted on exam.  No significant tenderness.  Likely musculoskeletal in nature.  Do not suspect gout.  Patient with normal circulation, motor, and sensation distally.  Urinary frequency: Patient with history of hematuria and this is again noted.  Patient was concerned about UTI however no nitrite or significant pyuria to suggest active UTI.  Patient denies other vaginal symptoms or concern for other infections.     Final diagnoses:  Pain of left lower extremity  Urinary frequency    ED Discharge Orders     None          Desiderio Chew, PA-C 04/17/24 1522    Geraldene Hamilton, MD 04/18/24 [phone]

## 2024-04-17 NOTE — Discharge Instructions (Signed)
 Please read and follow all provided instructions.  Your diagnoses today include:  1. Pain of left lower extremity   2. Urinary frequency     Tests performed today include: Urine test shows some blood but no definite infection today Ultrasound of your left lower extremity did not show any signs of blood clots Vital signs. See below for your results today.   Medications prescribed:  Please use over-the-counter NSAID medications (ibuprofen , naproxen ) or Tylenol  (acetaminophen ) as directed on the packaging for pain -- as long as you do not have any reasons avoid these medications. Reasons to avoid NSAID medications include: weak kidneys, a history of bleeding in your stomach or gut, or uncontrolled high blood pressure or previous heart attack. Reasons to avoid Tylenol  include: liver problems or ongoing alcohol use. Never take more than 4000mg  or 8 Extra strength Tylenol  in a 24 hour period.     Take any prescribed medications only as directed.  Home care instructions:  Follow any educational materials contained in this packet.  BE VERY CAREFUL not to take multiple medicines containing Tylenol  (also called acetaminophen ). Doing so can lead to an overdose which can damage your liver and cause liver failure and possibly death.   Follow-up instructions: Please follow-up with your primary care provider in the next 5 days for further evaluation of your symptoms.   Return instructions:  Please return to the Emergency Department if you experience worsening symptoms.  Please return if you have any other emergent concerns.  Additional Information:  Your vital signs today were: BP (!) 124/100   Pulse 71   Temp 97.9 F (36.6 C)   Resp 18   LMP 04/09/2024 (Approximate)   SpO2 100%  If your blood pressure (BP) was elevated above 135/85 this visit, please have this repeated by your doctor within one month. --------------

## 2024-04-18 ENCOUNTER — Other Ambulatory Visit: Payer: Self-pay | Admitting: Urology

## 2024-04-18 DIAGNOSIS — N200 Calculus of kidney: Secondary | ICD-10-CM

## 2024-04-18 NOTE — Progress Notes (Unsigned)
 04/20/2024 9:59 AM   Theresa Mooney [birth year] [id number]  Referring provider: Tanda Bleacher, MD [address] [place],  [place] [address]  Urological history: 1.  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 Randall's plaques  Chief Complaint  Patient presents with   Follow-up   Nephrolithiasis   HPI: Theresa Mooney is a 43 y.o. woman who presents today for frequency and back pain.    Previous records reviewed.   She was seen in the ED yesterday for calf pain, Dopplers ruled out DVT, urinary frequency and malodorous urine.  Her UA had 6-10 RBCs and nothing else remarkable.   She said she was also diagnosed with a yeast infection in the vagina by her PCP and was prescribed Diflucan .  She has not yet picked up the prescription.   She is also noted an increase in her urinary frequency and urgency and has malodorous urine.  She also has urinary leakage and has to wear panty liners, but this has been occurring for the last year.  Patient denies any modifying or aggravating factors.  Patient denies any recent UTI's, gross hematuria, dysuria or suprapubic/flank pain.  Patient denies any fevers, chills, nausea or vomiting.    UA 11-30 RBCs, greater than 10 epithelial cells and many bacteria.  KUB  no stones seen    Lab Results  Component Value Date   CREATININE 0.98 10/13/2023     PMH: Past Medical History:  Diagnosis Date   Asthma    Cannabis dependence in remission [facility])    Ectopic pregnancy    Elevated glucose    Herpes 2002   Infection    Migraine    Mixed hyperlipidemia    Nicotine dependence    Trichimoniasis 12-2010   Urinary calculi     Surgical History: Past Surgical History:  Procedure Laterality Date   DILATION AND CURETTAGE OF UTERUS     LAPAROSCOPY  11/27/2011   Procedure: LAPAROSCOPY OPERATIVE;  Surgeon: Winton Felt, MD;  Location: [facility];  Service: Gynecology;  Laterality:  Bilateral;   SALPINGECTOMY     Bilateral for twin ectopic preg.    TUBAL LIGATION  12-05-2010    Home Medications:  Allergies as of 04/20/2024       Reactions   Bactrim  [sulfamethoxazole -trimethoprim ] Shortness Of Breath, Other (See Comments)   Hydrocodone  Nausea Only   Dizziness   Penicillins    Did it involve swelling of the face/tongue/throat, SOB, or low BP? Y Did it involve sudden or severe rash/hives, skin peeling, or any reaction on the inside of your mouth or nose? N Did you need to seek medical attention at a hospital or doctor's office? Y When did it last happen?  Over 1 month     If all above answers are NO, may proceed with cephalosporin use.        Medication List        Accurate as of April 20, 2024  9:59 AM. If you have any questions, ask your nurse or doctor.          albuterol  108 (90 Base) MCG/ACT inhaler Commonly known as: VENTOLIN  HFA Inhale 2 puffs into the lungs every 6 (six) hours as needed for wheezing or shortness of breath.   cyclobenzaprine  5 MG tablet Commonly known as: FLEXERIL  Take 1 tablet (5 mg total) by mouth at bedtime.   fluconazole  150 MG tablet Commonly known as: DIFLUCAN  Take 1  tablet (150 mg total) by mouth every 3 (three) days for 3 doses.   nitrofurantoin  (macrocrystal-monohydrate) 100 MG capsule Commonly known as: MACROBID  Take 1 capsule (100 mg total) by mouth every 12 (twelve) hours.        Allergies:  Allergies  Allergen Reactions   Bactrim  [Sulfamethoxazole -Trimethoprim ] Shortness Of Breath and Other (See Comments)   Hydrocodone  Nausea Only    Dizziness   Penicillins     Did it involve swelling of the face/tongue/throat, SOB, or low BP? Y Did it involve sudden or severe rash/hives, skin peeling, or any reaction on the inside of your mouth or nose? N Did you need to seek medical attention at a hospital or doctor's office? Y When did it last happen?  Over 1 month     If all above answers are NO, may proceed  with cephalosporin use.      Family History: Family History  Problem Relation Age of Onset   Hypertension Mother    Asthma Mother    Hypertension Sister    Stroke Sister    Hyperlipidemia Daughter    Breast cancer Paternal Aunt 30 - 80   Diabetes Maternal Grandmother    Heart disease Maternal Grandmother    Hypertension Maternal Grandmother    Diabetes Maternal Grandfather    Heart disease Maternal Grandfather    Hypertension Maternal Grandfather    Healthy Son    Healthy Son    Healthy Son    Anesthesia problems Neg Hx     Social History: See HPI for pertinent social history  ROS: Pertinent ROS in HPI  Physical Exam: BP 92/67 (BP Location: Right Arm, Patient Position: Sitting, Cuff Size: Large)   Pulse 81   Ht 4' 11 (1.499 m)   Wt 230 lb (104.3 kg)   LMP 04/09/2024 (Approximate)   SpO2 95%   BMI 46.45 kg/m   Constitutional:  Well nourished. Alert and oriented, No acute distress. HEENT: Junior AT, moist mucus membranes.  Trachea midline Cardiovascular: No clubbing, cyanosis, or edema. Respiratory: Normal respiratory effort, no increased work of breathing. Neurologic: Grossly intact, no focal deficits, moving all 4 extremities. Psychiatric: Normal mood and affect.    Laboratory Data: See EPIC and HPI  I have reviewed the labs.   Pertinent Imaging: KUB no obvious urinary calculi seen.   I have independently reviewed the films.  See HPI.  Radiology interpretation still pending.    Assessment & Plan:    1. Randall's plaques Encouraged to maintain fluid intake throughout the day to maintain urine volume of at least 2.5 L daily Encouraged to distribute that fluid intake throughout the day to maintain consistent urine dilution Discourage sugar sweetened beverages which increases risks Advised that coffee, tea, wine and orange juice are associated with lower stone risk Limit dietary sodium to 2300 mg daily Consume 1200 mg of dietary calcium daily and try to avoid  taking calcium supplements which increased stone risk Limit intake of oxalate rich foods  2. Frequency - UA w/ micro heme and many bacteria - urine culture pending, atypical culture pending and GC chlamydia cultures pending - Micro he may be due to her stones, vaginal yeast infection or UTI - Started on Macrobid  empirically, will adjust if necessary once urine culture results are available   3. LBP  - KUB no obvious ureteral calculi  4. Microscopic hematuria - UA with micro heme - Explained to the patient this may be result of her stones, UTI or vaginal yeast infection and  that is important for her to follow-up in 2 months to ensure her symptoms have abated and the microscopic hematuria abates with the treatment of her infections  Return in about 2 months (around 06/21/2024) for repeat UA and symptom recheck .  These notes generated with voice recognition software. I apologize for typographical errors.  Theresa Mooney  [facility] [address]  [address] [place], [place] [address] [phone]

## 2024-04-19 ENCOUNTER — Ambulatory Visit
Admission: RE | Admit: 2024-04-19 | Discharge: 2024-04-19 | Disposition: A | Discharge status: Home / Self Care | Source: Ambulatory Visit | Attending: Urology | Admitting: Urology

## 2024-04-19 ENCOUNTER — Telehealth: Payer: Self-pay

## 2024-04-19 DIAGNOSIS — N200 Calculus of kidney: Secondary | ICD-10-CM | POA: Insufficient documentation

## 2024-04-19 LAB — CERVICOVAGINAL ANCILLARY ONLY
Bacterial Vaginitis (gardnerella): NEGATIVE
Candida Glabrata: NEGATIVE
Candida Vaginitis: POSITIVE — AB
Chlamydia: NEGATIVE
Comment: NEGATIVE
Comment: NEGATIVE
Comment: NEGATIVE
Comment: NEGATIVE
Comment: NEGATIVE
Comment: NORMAL
Neisseria Gonorrhea: NEGATIVE
Trichomonas: NEGATIVE

## 2024-04-19 NOTE — Telephone Encounter (Signed)
 Patient called.. said saw results on mychart?  [place] will she get script for Vaginitis  Copied from CRM [id number]. Topic: Clinical - Medication Question >> Apr 19, 2024  3:20 PM Theresa Mooney wrote: Reason for CRM:

## 2024-04-20 ENCOUNTER — Ambulatory Visit (INDEPENDENT_AMBULATORY_CARE_PROVIDER_SITE_OTHER): Admitting: Urology

## 2024-04-20 ENCOUNTER — Encounter: Payer: Self-pay | Admitting: Urology

## 2024-04-20 VITALS — BP 92/67 | HR 81 | Ht 59.0 in | Wt 230.0 lb

## 2024-04-20 DIAGNOSIS — R350 Frequency of micturition: Secondary | ICD-10-CM

## 2024-04-20 DIAGNOSIS — R3129 Other microscopic hematuria: Secondary | ICD-10-CM | POA: Diagnosis not present

## 2024-04-20 DIAGNOSIS — M5440 Lumbago with sciatica, unspecified side: Secondary | ICD-10-CM | POA: Diagnosis not present

## 2024-04-20 DIAGNOSIS — G8929 Other chronic pain: Secondary | ICD-10-CM

## 2024-04-20 DIAGNOSIS — N200 Calculus of kidney: Secondary | ICD-10-CM

## 2024-04-20 LAB — URINALYSIS, COMPLETE
Bilirubin, UA: NEGATIVE
Glucose, UA: NEGATIVE
Ketones, UA: NEGATIVE
Leukocytes,UA: NEGATIVE
Nitrite, UA: NEGATIVE
Protein,UA: NEGATIVE
Specific Gravity, UA: 1.03 (ref 1.005–1.030)
Urobilinogen, Ur: 0.2 mg/dL (ref 0.2–1.0)
pH, UA: 6 (ref 5.0–7.5)

## 2024-04-20 LAB — MICROSCOPIC EXAMINATION: Epithelial Cells (non renal): >10 /HPF — AB (ref 0–10)

## 2024-04-20 MED ORDER — FLUCONAZOLE 150 MG PO TABS: 150.0000 mg | ORAL_TABLET | ORAL | 0 refills | Status: AC

## 2024-04-20 MED ORDER — NITROFURANTOIN MONOHYD MACRO 100 MG PO CAPS: 100.0000 mg | ORAL_CAPSULE | Freq: Two times a day (BID) | ORAL | 0 refills | Status: DC

## 2024-04-23 LAB — GC/CHLAMYDIA PROBE AMP
Chlamydia trachomatis, NAA: NEGATIVE
Neisseria Gonorrhoeae by PCR: NEGATIVE

## 2024-04-25 LAB — CULTURE, URINE COMPREHENSIVE

## 2024-04-26 ENCOUNTER — Ambulatory Visit: Payer: Self-pay

## 2024-04-26 NOTE — Telephone Encounter (Signed)
 FYI Only or Action Required?: Action required by provider: medication refill request.  Patient was last seen in primary care on 04/17/2024 by Lorren Greig PARAS, NP.  Called Nurse Triage reporting Medication Refill.  Symptoms began today.  Interventions attempted: Prescription medications: Fluconazole .  Symptoms are: unchanged.  Triage Disposition: Call PCP Now  Patient/caregiver understands and will follow disposition?: Yes Reason for Disposition  [1] Prescription not at pharmacy AND [2] was prescribed by doctor (or NP/PA) recently  (Exception: [facility] has access to EMR and prescription is recorded there. Go to Home Care and confirm prescription for pharmacy.)  Answer Assessment - Initial Assessment Questions 1. DRUG NAME: What medicine do you need to have refilled?     Fluconazole   3. EXPIRATION DATE: What is the expiration date? Note: The label states when the prescription will expire, and thus can no longer be refilled.)     Unsure  4. PRESCRIBER: Who prescribed it? Note: The prescribing doctor or group is responsible for refill approvals.SABRA Greig Lorren  5. PHARMACY: Have you contacted your pharmacy (drugstore)? Note: Some pharmacies will contact the doctor (or NP/PA).      No  6. SYMPTOMS: Do you have any symptoms?     White, Watery Discharge, Odor remains but is better, Itching Remains but is better.   7. PREGNANCY: Is there any chance that you are pregnant? When was your last menstrual period?     No and No  Protocols used: Medication Refill and Renewal Call-A-AH

## 2024-04-27 ENCOUNTER — Other Ambulatory Visit (HOSPITAL_COMMUNITY)
Admission: RE | Admit: 2024-04-27 | Discharge: 2024-04-27 | Disposition: A | Discharge status: Home / Self Care | Source: Ambulatory Visit | Attending: Family Medicine | Admitting: Family Medicine

## 2024-04-27 ENCOUNTER — Ambulatory Visit: Admitting: *Deleted

## 2024-04-27 DIAGNOSIS — N898 Other specified noninflammatory disorders of vagina: Secondary | ICD-10-CM

## 2024-04-27 LAB — MYCOPLASMA / UREAPLASMA CULTURE

## 2024-04-29 ENCOUNTER — Ambulatory Visit: Payer: Self-pay | Admitting: Urology

## 2024-04-30 ENCOUNTER — Ambulatory Visit: Payer: Self-pay | Admitting: Family Medicine

## 2024-04-30 LAB — CERVICOVAGINAL ANCILLARY ONLY
Bacterial Vaginitis (gardnerella): NEGATIVE
Candida Glabrata: NEGATIVE
Candida Vaginitis: NEGATIVE
Chlamydia: NEGATIVE
Comment: NEGATIVE
Comment: NEGATIVE
Comment: NEGATIVE
Comment: NEGATIVE
Comment: NEGATIVE
Comment: NORMAL
Neisseria Gonorrhea: NEGATIVE
Trichomonas: NEGATIVE

## 2024-05-07 ENCOUNTER — Ambulatory Visit (INDEPENDENT_AMBULATORY_CARE_PROVIDER_SITE_OTHER): Admitting: Family Medicine

## 2024-05-07 ENCOUNTER — Telehealth: Payer: Self-pay

## 2024-05-07 ENCOUNTER — Encounter: Payer: Self-pay | Admitting: Family Medicine

## 2024-05-07 ENCOUNTER — Other Ambulatory Visit: Payer: Self-pay

## 2024-05-07 VITALS — BP 120/87 | HR 76 | Ht 59.0 in | Wt 225.0 lb

## 2024-05-07 DIAGNOSIS — R42 Dizziness and giddiness: Secondary | ICD-10-CM

## 2024-05-07 DIAGNOSIS — N898 Other specified noninflammatory disorders of vagina: Secondary | ICD-10-CM | POA: Diagnosis not present

## 2024-05-07 NOTE — Telephone Encounter (Signed)
 Pt is here in office and states she is having trouble getting her flexeril  from the pharmacy. Are you able to see if a PA is needed/ or if one was done already and denied?

## 2024-05-07 NOTE — Progress Notes (Signed)
 Established Patient Office Visit  Subjective    Patient ID: Theresa Mooney, female    DOB: 1981/01/01  Age: 43 y.o. MRN: [id number]  CC:  Chief Complaint  Patient presents with   Annual Exam    Pt reports UTI symptoms since June. Pain during intercourse. Pt reports she had an x ray at her urologist about 2 weeks ago and she was constipated and still feels constipated.    HPI Theresa Mooney presents with complaint of vaginal irritation. Patient has had sx persist off and on for several months. Patient denies vag discharge.   Outpatient Encounter Medications as of 05/07/2024  Medication Sig   albuterol  (VENTOLIN  HFA) 108 (90 Base) MCG/ACT inhaler Inhale 2 puffs into the lungs every 6 (six) hours as needed for wheezing or shortness of breath.   cyclobenzaprine  (FLEXERIL ) 5 MG tablet Take 1 tablet (5 mg total) by mouth at bedtime.   nitrofurantoin , macrocrystal-monohydrate, (MACROBID ) 100 MG capsule Take 1 capsule (100 mg total) by mouth every 12 (twelve) hours.   [DISCONTINUED] SUMAtriptan  (IMITREX ) 50 MG tablet Take 1 tablet (50 mg total) by mouth every 2 (two) hours as needed for migraine. May repeat in 2 hours if headache persists or recurs. (Patient not taking: Reported on 07/02/2020)   No facility-administered encounter medications on file as of 05/07/2024.    Past Medical History:  Diagnosis Date   Asthma    Cannabis dependence in remission [facility])    Ectopic pregnancy    Elevated glucose    Herpes 2002   Infection    Migraine    Mixed hyperlipidemia    Nicotine dependence    Trichimoniasis 12-2010   Urinary calculi     Past Surgical History:  Procedure Laterality Date   DILATION AND CURETTAGE OF UTERUS     LAPAROSCOPY  11/27/2011   Procedure: LAPAROSCOPY OPERATIVE;  Surgeon: Winton Felt, MD;  Location: [facility];  Service: Gynecology;  Laterality: Bilateral;   SALPINGECTOMY     Bilateral for twin ectopic preg.    TUBAL LIGATION  12-05-2010    Family History   Problem Relation Age of Onset   Hypertension Mother    Asthma Mother    Hypertension Sister    Stroke Sister    Hyperlipidemia Daughter    Breast cancer Paternal Aunt 29 - 46   Diabetes Maternal Grandmother    Heart disease Maternal Grandmother    Hypertension Maternal Grandmother    Diabetes Maternal Grandfather    Heart disease Maternal Grandfather    Hypertension Maternal Grandfather    Healthy Son    Healthy Son    Healthy Son    Anesthesia problems Neg Hx     Social History   Socioeconomic History   Marital status: Single    Spouse name: Not on file   Number of children: Not on file   Years of education: Not on file   Highest education level: Not on file  Occupational History   Not on file  Tobacco Use   Smoking status: Former    Types: Cigarettes    Passive exposure: Past   Smokeless tobacco: Never  Vaping Use   Vaping status: Never Used  Substance and Sexual Activity   Alcohol use: Not Currently    Alcohol/week: 1.0 standard drink of alcohol    Types: 1 Glasses of wine per week    Comment: social   Drug use: Not Currently    Types: Marijuana   Sexual activity: Yes  Birth control/protection: Surgical  Other Topics Concern   Not on file  Social History Narrative   Not on file   Social Drivers of Health   Financial Resource Strain: Low Risk  (10/13/2023)   Overall Financial Resource Strain (CARDIA)    Difficulty of Paying Living Expenses: Not hard at all  Food Insecurity: No Food Insecurity (10/13/2023)   Hunger Vital Sign    Worried About Running Out of Food in the Last Year: Never true    Ran Out of Food in the Last Year: Never true  Transportation Needs: No Transportation Needs (10/13/2023)   PRAPARE - Administrator, Civil Service (Medical): No    Lack of Transportation (Non-Medical): No  Physical Activity: Insufficiently Active (10/13/2023)   Exercise Vital Sign    Days of Exercise per Week: 1 day    Minutes of Exercise per  Session: 30 min  Stress: Stress Concern Present (10/13/2023)   [employer] of Occupational Health - Occupational Stress Questionnaire    Feeling of Stress : Very much  Social Connections: Moderately Isolated (10/13/2023)   Social Connection and Isolation Panel    Frequency of Communication with Friends and Family: Three times a week    Frequency of Social Gatherings with Friends and Family: Once a week    Attends Religious Services: More than 4 times per year    Active Member of [employer] or Organizations: No    Attends Banker Meetings: Never    Marital Status: Never married  Intimate Partner Violence: Not At Risk (10/13/2023)   Humiliation, Afraid, Rape, and Kick questionnaire    Fear of Current or Ex-Partner: No    Emotionally Abused: No    Physically Abused: No    Sexually Abused: No    Review of Systems  All other systems reviewed and are negative.       Objective    BP 120/87   Pulse 76   Ht 4' 11 (1.499 m)   Wt 225 lb (102.1 kg)   LMP 04/09/2024 (Approximate)   SpO2 94%   BMI 45.44 kg/m   Physical Exam Vitals and nursing note reviewed.  Constitutional:      General: She is not in acute distress. Cardiovascular:     Rate and Rhythm: Normal rate and regular rhythm.  Pulmonary:     Effort: Pulmonary effort is normal.     Breath sounds: Normal breath sounds.  Abdominal:     Palpations: Abdomen is soft.     Tenderness: There is no abdominal tenderness.  Neurological:     General: No focal deficit present.     Mental Status: She is alert and oriented to person, place, and time.         Assessment & Plan:   1. Vaginal irritation (Primary) After intense question, patient realized that her partner has been bathing in a soap that is probably causing her irritation when they have sexual intercourse. Patient to tell partner to change wash. monitor  2. Dizziness and giddiness      No follow-ups on file.   Tanda Raguel SQUIBB, MD

## 2024-05-10 ENCOUNTER — Encounter: Payer: Self-pay | Admitting: Family Medicine

## 2024-05-25 ENCOUNTER — Ambulatory Visit: Admitting: Physician Assistant

## 2024-06-08 ENCOUNTER — Ambulatory Visit
Admission: EM | Admit: 2024-06-08 | Discharge: 2024-06-08 | Disposition: A | Discharge status: Home / Self Care | Attending: Family Medicine | Admitting: Family Medicine

## 2024-06-08 ENCOUNTER — Encounter: Payer: Self-pay | Admitting: *Deleted

## 2024-06-08 ENCOUNTER — Other Ambulatory Visit: Payer: Self-pay

## 2024-06-08 DIAGNOSIS — N390 Urinary tract infection, site not specified: Secondary | ICD-10-CM | POA: Insufficient documentation

## 2024-06-08 DIAGNOSIS — R197 Diarrhea, unspecified: Secondary | ICD-10-CM | POA: Diagnosis present

## 2024-06-08 DIAGNOSIS — N898 Other specified noninflammatory disorders of vagina: Secondary | ICD-10-CM | POA: Diagnosis present

## 2024-06-08 LAB — POCT URINE DIPSTICK
Glucose, UA: 100 mg/dL — AB
Ketones, POC UA: NEGATIVE mg/dL
Nitrite, UA: POSITIVE — AB
POC PROTEIN,UA: 100 — AB
Spec Grav, UA: 1.02 (ref 1.010–1.025)
Urobilinogen, UA: 2 U/dL — AB
pH, UA: 5 (ref 5.0–8.0)

## 2024-06-08 MED ORDER — CIPROFLOXACIN HCL 500 MG PO TABS: 500.0000 mg | ORAL_TABLET | Freq: Two times a day (BID) | ORAL | 0 refills | Status: AC

## 2024-06-08 MED ORDER — PHENAZOPYRIDINE HCL 200 MG PO TABS: 200.0000 mg | ORAL_TABLET | Freq: Three times a day (TID) | ORAL | 0 refills | Status: AC

## 2024-06-08 NOTE — ED Triage Notes (Signed)
 Pt reports UTI sx since Tuesday. States she used a toy and after she began having burning when she urinates. States she has chills at the of urination. Also reports she has been pooping constantly since then. C/o lower abdominal pain. She has been taking AZO. She is unsure if she having vaginal discharge due to taking the AZO

## 2024-06-08 NOTE — ED Provider Notes (Signed)
 [name]    CSN: [id number] Arrival date & time: 06/08/24  0805      History   Chief Complaint Chief Complaint  Patient presents with   UTI Symptoms    HPI Theresa Mooney is a 43 y.o. female.   Patient is here for UTI symptoms. She used a toy about 4 days ago and since then having burning with urination, frequency.  Abdominal pain, also with frequent stool.  Not sure about vaginal discharge.   No fevers/chills.  Some nausea, no vomiting.         Past Medical History:  Diagnosis Date   Asthma    Cannabis dependence in remission [facility])    Ectopic pregnancy    Elevated glucose    Herpes 2002   Infection    Migraine    Mixed hyperlipidemia    Nicotine dependence    Trichimoniasis 12-2010   Urinary calculi     Patient Active Problem List   Diagnosis Date Noted   Bacterial vaginitis 03/19/2021   Pain in both lower extremities 03/17/2021   Migraine without status migrainosus, not intractable 06/23/2020   Encounter for sterilization 12/13/2011   S/P ectopic pregnancy 12/13/2011    Past Surgical History:  Procedure Laterality Date   DILATION AND CURETTAGE OF UTERUS     LAPAROSCOPY  11/27/2011   Procedure: LAPAROSCOPY OPERATIVE;  Surgeon: Winton Felt, MD;  Location: [facility];  Service: Gynecology;  Laterality: Bilateral;   SALPINGECTOMY     Bilateral for twin ectopic preg.    TUBAL LIGATION  12-05-2010    OB History     Gravida  7   Para  5   Term  5   Preterm      AB  2   Living  5      SAB      IAB  1   Ectopic  1   Multiple      Live Births  5            Home Medications    Prior to Admission medications   Medication Sig Start Date End Date Taking? Authorizing Provider  albuterol  (VENTOLIN  HFA) 108 (90 Base) MCG/ACT inhaler Inhale 2 puffs into the lungs every 6 (six) hours as needed for wheezing or shortness of breath. 01/19/24  Yes Tanda Bleacher, MD  cyclobenzaprine  (FLEXERIL ) 5 MG tablet Take 1 tablet (5  mg total) by mouth at bedtime. Patient not taking: Reported on 06/08/2024 04/17/24   Lorren Greig JINNY, NP  nitrofurantoin , macrocrystal-monohydrate, (MACROBID ) 100 MG capsule Take 1 capsule (100 mg total) by mouth every 12 (twelve) hours. Patient not taking: Reported on 06/08/2024 04/20/24   Helon Kirsch A, PA-C  SUMAtriptan  (IMITREX ) 50 MG tablet Take 1 tablet (50 mg total) by mouth every 2 (two) hours as needed for migraine. May repeat in 2 hours if headache persists or recurs. Patient not taking: Reported on 07/02/2020 06/23/20 07/29/20  Mayers, Kirk RAMAN, PA-C    Family History Family History  Problem Relation Age of Onset   Hypertension Mother    Asthma Mother    Hypertension Sister    Stroke Sister    Hyperlipidemia Daughter    Breast cancer Paternal Aunt 69 - 71   Diabetes Maternal Grandmother    Heart disease Maternal Grandmother    Hypertension Maternal Grandmother    Diabetes Maternal Grandfather    Heart disease Maternal Grandfather    Hypertension Maternal Grandfather    Healthy Son  Healthy Son    Healthy Son    Anesthesia problems Neg Hx     Social History Social History   Tobacco Use   Smoking status: Former    Types: Cigarettes    Passive exposure: Past   Smokeless tobacco: Never  Vaping Use   Vaping status: Never Used  Substance Use Topics   Alcohol use: Not Currently    Alcohol/week: 1.0 standard drink of alcohol    Types: 1 Glasses of wine per week    Comment: social   Drug use: Not Currently    Types: Marijuana     Allergies   Bactrim  [sulfamethoxazole -trimethoprim ], Hydrocodone , and Penicillins   Review of Systems Review of Systems  Constitutional: Negative.   HENT: Negative.    Respiratory: Negative.    Cardiovascular: Negative.   Gastrointestinal:  Positive for abdominal pain.  Genitourinary:  Positive for dysuria and frequency.  Musculoskeletal: Negative.   Psychiatric/Behavioral: Negative.       Physical Exam Triage Vital  Signs ED Triage Vitals [06/08/24 0822]  Encounter Vitals Group     BP      Girls Systolic BP Percentile      Girls Diastolic BP Percentile      Boys Systolic BP Percentile      Boys Diastolic BP Percentile      Pulse      Resp      Temp      Temp src      SpO2      Weight      Height      Head Circumference      Peak Flow      Pain Score 5     Pain Loc      Pain Education      Exclude from Growth Chart    No data found.  Updated Vital Signs LMP 05/28/2024   Visual Acuity Right Eye Distance:   Left Eye Distance:   Bilateral Distance:    Right Eye Near:   Left Eye Near:    Bilateral Near:     Physical Exam Constitutional:      Appearance: Normal appearance. She is normal weight.  Cardiovascular:     Rate and Rhythm: Normal rate and regular rhythm.  Pulmonary:     Effort: Pulmonary effort is normal.     Breath sounds: Normal breath sounds.  Abdominal:     Palpations: Abdomen is soft.     Tenderness: There is abdominal tenderness. There is no right CVA tenderness, left CVA tenderness, guarding or rebound.  Neurological:     General: No focal deficit present.     Mental Status: She is alert.  Psychiatric:        Mood and Affect: Mood normal.      UC Treatments / Results  Labs (all labs ordered are listed, but only abnormal results are displayed) Labs Reviewed  POCT URINE DIPSTICK - Abnormal; Notable for the following components:      Result Value   Color, UA orange (*)    Clarity, UA cloudy (*)    Glucose, UA =100 (*)    Bilirubin, UA small (*)    Blood, UA small (*)    POC PROTEIN,UA =100 (*)    Urobilinogen, UA 2.0 (*)    Nitrite, UA Positive (*)    Leukocytes, UA Large (3+) (*)    All other components within normal limits  URINE CULTURE  CERVICOVAGINAL ANCILLARY ONLY    EKG   Radiology  No results found.  Procedures Procedures (including critical care time)  Medications Ordered in UC Medications - No data to display  Initial  Impression / Assessment and Plan / UC Course  I have reviewed the triage vital signs and the nursing notes.  Pertinent labs & imaging results that were available during my care of the patient were reviewed by me and considered in my medical decision making (see chart for details).   Final Clinical Impressions(s) / UC Diagnoses   Final diagnoses:  Urinary tract infection without hematuria, site unspecified  Vaginal discharge  Diarrhea, unspecified type     Discharge Instructions      You were seen today for urinary symptoms.  Your urine does show infection.  I have sent out an oral antibiotic and medication for discomfort.  The vaginal swab will be resulted tomorrow, and the urine culture in several days.  If a change in medication is needed we will let you know.  Please increase fluid intake.  Please return or go to the ER if not improving or worsening despite medication.     ED Prescriptions     Medication Sig Dispense Auth. Provider   ciprofloxacin  (CIPRO ) 500 MG tablet Take 1 tablet (500 mg total) by mouth every 12 (twelve) hours for 7 days. 14 tablet Monroe Qin, MD   phenazopyridine  (PYRIDIUM ) 200 MG tablet Take 1 tablet (200 mg total) by mouth 3 (three) times daily. 6 tablet Darral Longs, MD      PDMP not reviewed this encounter.   Darral Longs, MD 06/08/24 [phone]

## 2024-06-08 NOTE — Discharge Instructions (Signed)
 You were seen today for urinary symptoms.  Your urine does show infection.  I have sent out an oral antibiotic and medication for discomfort.  The vaginal swab will be resulted tomorrow, and the urine culture in several days.  If a change in medication is needed we will let you know.  Please increase fluid intake.  Please return or go to the ER if not improving or worsening despite medication.

## 2024-06-09 LAB — URINE CULTURE: Culture: <10000 — AB

## 2024-06-11 LAB — CERVICOVAGINAL ANCILLARY ONLY
Bacterial Vaginitis (gardnerella): NEGATIVE
Candida Glabrata: NEGATIVE
Candida Vaginitis: NEGATIVE
Chlamydia: NEGATIVE
Comment: NEGATIVE
Comment: NEGATIVE
Comment: NEGATIVE
Comment: NEGATIVE
Comment: NEGATIVE
Comment: NORMAL
Neisseria Gonorrhea: NEGATIVE
Trichomonas: NEGATIVE

## 2024-06-12 ENCOUNTER — Ambulatory Visit: Admitting: Obstetrics and Gynecology

## 2024-06-18 ENCOUNTER — Ambulatory Visit: Admitting: Family Medicine

## 2024-06-19 NOTE — Progress Notes (Unsigned)
 06/21/2024 9:40 AM   Theresa Mooney July 25, 1981 [id number]  Referring provider: Tanda Bleacher, MD [address] [place],  [place] [address]  Urological history: 1.  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 Randall's plaques  Chief Complaint  Patient presents with   Follow-up   HPI: Theresa Mooney is a 43 y.o. woman who presents today for repeat UA and symptom recheck.     Previous records reviewed.   She was seen in [name] on 06/08/2024 for burning with urination, frequency, frequent stool and abdominal pain after using a toy.  Vaginal swabs negative.  Urine culture <10.000 colonies.  She states her symptoms abated while she was on the Cipro , but a couple days after she stopped the Cipro , her symptoms returned.    She has had persistent 11-30 RBCs on her last 3 UAs, but the previous 2 had greater than 10 epithelial cells.  Today's UA only has 0-10 epithelial cells with persistent 11-30 RBCs and some of her urine cultures have been negative.  She continues to have persistent dysuria.  Patient denies any modifying or aggravating factors.  Patient denies any gross hematuria, or suprapubic/flank pain.  Patient denies any fevers, chills, nausea or vomiting.    PMH: Past Medical History:  Diagnosis Date   Asthma    Cannabis dependence in remission [facility])    Ectopic pregnancy    Elevated glucose    Herpes 2002   Infection    Migraine    Mixed hyperlipidemia    Nicotine dependence    Trichimoniasis 12-2010   Urinary calculi     Surgical History: Past Surgical History:  Procedure Laterality Date   DILATION AND CURETTAGE OF UTERUS     LAPAROSCOPY  11/27/2011   Procedure: LAPAROSCOPY OPERATIVE;  Surgeon: Winton Felt, MD;  Location: [facility];  Service: Gynecology;  Laterality: Bilateral;   SALPINGECTOMY     Bilateral for twin ectopic preg.    TUBAL LIGATION  12-05-2010    Home  Medications:  Allergies as of 06/21/2024       Reactions   Bactrim  [sulfamethoxazole -trimethoprim ] Shortness Of Breath, Other (See Comments)   Hydrocodone  Nausea Only   Dizziness   Penicillins    Did it involve swelling of the face/tongue/throat, SOB, or low BP? Y Did it involve sudden or severe rash/hives, skin peeling, or any reaction on the inside of your mouth or nose? N Did you need to seek medical attention at a hospital or doctor's office? Y When did it last happen?  Over 1 month     If all above answers are NO, may proceed with cephalosporin use.        Medication List        Accurate as of June 21, 2024  9:40 AM. If you have any questions, ask your nurse or doctor.          albuterol  108 (90 Base) MCG/ACT inhaler Commonly known as: VENTOLIN  HFA Inhale 2 puffs into the lungs every 6 (six) hours as needed for wheezing or shortness of breath.   ciprofloxacin  500 MG tablet Commonly known as: Cipro  Take 1 tablet (500 mg total) by mouth 2 (two) times daily for 7 days. Started by: Jas Betten   phenazopyridine  200 MG tablet Commonly known as: PYRIDIUM  Take 1 tablet (200 mg total) by mouth 3 (three) times daily.        Allergies:  Allergies  Allergen Reactions   Bactrim  [Sulfamethoxazole -Trimethoprim ] Shortness Of Breath and Other (See Comments)   Hydrocodone  Nausea Only    Dizziness   Penicillins     Did it involve swelling of the face/tongue/throat, SOB, or low BP? Y Did it involve sudden or severe rash/hives, skin peeling, or any reaction on the inside of your mouth or nose? N Did you need to seek medical attention at a hospital or doctor's office? Y When did it last happen?  Over 1 month     If all above answers are NO, may proceed with cephalosporin use.      Family History: Family History  Problem Relation Age of Onset   Hypertension Mother    Asthma Mother    Hypertension Sister    Stroke Sister    Hyperlipidemia Daughter     Breast cancer Paternal Aunt 24 - 20   Diabetes Maternal Grandmother    Heart disease Maternal Grandmother    Hypertension Maternal Grandmother    Diabetes Maternal Grandfather    Heart disease Maternal Grandfather    Hypertension Maternal Grandfather    Healthy Son    Healthy Son    Healthy Son    Anesthesia problems Neg Hx     Social History: See HPI for pertinent social history  ROS: Pertinent ROS in HPI  Physical Exam: Ht 4' 11 (1.499 m)   Wt 225 lb (102.1 kg)   LMP 05/28/2024   BMI 45.44 kg/m   Constitutional:  Well nourished. Alert and oriented, No acute distress. HEENT: [place] AT, moist mucus membranes.  Trachea midline Cardiovascular: No clubbing, cyanosis, or edema. Respiratory: Normal respiratory effort, no increased work of breathing. Neurologic: Grossly intact, no focal deficits, moving all 4 extremities. Psychiatric: Normal mood and affect.      Laboratory Data: See EPIC and HPI  I have reviewed the labs.   Pertinent Imaging: N/A  Assessment & Plan:    1. 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 Dysuria - If her hematuria workup is negative, we will consider starting vaginal estrogen cream for GSM  Return for CT urogram and cysto for worsening micro heme and persistent dysuria.  These notes generated with voice recognition software. I apologize for typographical errors.  CLOTILDA HELON RIGGERS  [facility] [address]  [address] [place], [place] [address] [phone]

## 2024-06-21 ENCOUNTER — Encounter: Payer: Self-pay | Admitting: Urology

## 2024-06-21 ENCOUNTER — Ambulatory Visit (INDEPENDENT_AMBULATORY_CARE_PROVIDER_SITE_OTHER): Admitting: Urology

## 2024-06-21 VITALS — Ht 59.0 in | Wt 225.0 lb

## 2024-06-21 DIAGNOSIS — R300 Dysuria: Secondary | ICD-10-CM

## 2024-06-21 DIAGNOSIS — R319 Hematuria, unspecified: Secondary | ICD-10-CM

## 2024-06-21 DIAGNOSIS — N200 Calculus of kidney: Secondary | ICD-10-CM | POA: Diagnosis not present

## 2024-06-21 LAB — URINALYSIS, COMPLETE
Bilirubin, UA: NEGATIVE
Glucose, UA: NEGATIVE
Ketones, UA: NEGATIVE
Leukocytes,UA: NEGATIVE
Nitrite, UA: NEGATIVE
Protein,UA: NEGATIVE
Specific Gravity, UA: 1.03 (ref 1.005–1.030)
Urobilinogen, Ur: 0.2 mg/dL (ref 0.2–1.0)
pH, UA: 6 (ref 5.0–7.5)

## 2024-06-21 LAB — MICROSCOPIC EXAMINATION

## 2024-06-21 MED ORDER — CIPROFLOXACIN HCL 500 MG PO TABS: 500.0000 mg | ORAL_TABLET | Freq: Two times a day (BID) | ORAL | 0 refills | Status: AC

## 2024-06-21 NOTE — Patient Instructions (Addendum)
 We have ordered a CT urogram and the scheduling department should reach out to you to schedule this appointment.  Sometimes, they have difficulty reaching patients because they are calling from an unknown number and many people have their phones set to ignore unknown numbers.  They do try to leave messages, but sometimes voicemails are not set up or mailboxes are full.  If you have not heard from the scheduling department in a week, please call [phone] to schedule your CT urogram    Cystoscopy Cystoscopy is a procedure that is used to help diagnose and sometimes treat conditions that affect the lower urinary tract. The lower urinary tract includes the bladder and the urethra. The urethra is the tube that drains urine from the bladder. Cystoscopy is done using a thin, tube-shaped instrument with a light and camera at the end (cystoscope). The cystoscope may be hard or flexible, depending on the goal of the procedure. The cystoscope is inserted through the urethra, into the bladder. Cystoscopy may be recommended if you have: Urinary tract infections that keep coming back. Blood in the urine (hematuria). An inability to control when you urinate (urinary incontinence) or an overactive bladder. Unusual cells found in a urine sample. A blockage in the urethra, such as a urinary stone. Painful urination. An abnormality in the bladder found during an intravenous pyelogram (IVP) or CT scan. What are the risks? Generally, this is a safe procedure. However, problems may occur, including: Infection. Bleeding.  What happens during the procedure?  You will be given one or more of the following: A medicine to numb the area (local anesthetic). The area around the opening of your urethra will be cleaned. The cystoscope will be passed through your urethra into your bladder. Germ-free (sterile) fluid will flow through the cystoscope to fill your bladder. The fluid will stretch your bladder so that your  health care provider can clearly examine your bladder walls. Your doctor will look at the urethra and bladder. The cystoscope will be removed The procedure may vary among health care providers  What can I expect after the procedure? After the procedure, it is common to have: Some soreness or pain in your urethra. Urinary symptoms. These include: Mild pain or burning when you urinate. Pain should stop within a few minutes after you urinate. This may last for up to a few days after the procedure. A small amount of blood in your urine for several days. Feeling like you need to urinate but producing only a small amount of urine. Follow these instructions at home: General instructions Return to your normal activities as told by your health care provider.  Drink plenty of fluids after the procedure. Keep all follow-up visits as told by your health care provider. This is important. Contact a health care provider if you: Have pain that gets worse or does not get better with medicine, especially pain when you urinate lasting longer than 72 hours after the procedure. Have trouble urinating. Get help right away if you: Have blood clots in your urine. Have a fever or chills. Are unable to urinate. Summary Cystoscopy is a procedure that is used to help diagnose and sometimes treat conditions that affect the lower urinary tract. Cystoscopy is done using a thin, tube-shaped instrument with a light and camera at the end. After the procedure, it is common to have some soreness or pain in your urethra. It is normal to have blood in your urine after the procedure.  If you were prescribed an  antibiotic medicine, take it as told by your health care provider.  This information is not intended to replace advice given to you by your health care provider. Make sure you discuss any questions you have with your health care provider. Document Revised: 09/05/2018 Document Reviewed: 09/05/2018 Elsevier Patient  Education  2020 [employer].

## 2024-06-26 ENCOUNTER — Ambulatory Visit: Payer: Self-pay | Admitting: Urology

## 2024-06-26 ENCOUNTER — Other Ambulatory Visit (HOSPITAL_COMMUNITY)
Admission: RE | Admit: 2024-06-26 | Discharge: 2024-06-26 | Disposition: A | Discharge status: Home / Self Care | Source: Ambulatory Visit | Attending: Obstetrics and Gynecology | Admitting: Obstetrics and Gynecology

## 2024-06-26 ENCOUNTER — Ambulatory Visit: Admitting: Obstetrics and Gynecology

## 2024-06-26 VITALS — BP 129/87 | HR 77 | Ht 59.0 in | Wt 229.0 lb

## 2024-06-26 DIAGNOSIS — Z124 Encounter for screening for malignant neoplasm of cervix: Secondary | ICD-10-CM | POA: Insufficient documentation

## 2024-06-26 DIAGNOSIS — Z8619 Personal history of other infectious and parasitic diseases: Secondary | ICD-10-CM

## 2024-06-26 LAB — CULTURE, URINE COMPREHENSIVE

## 2024-06-26 MED ORDER — VALACYCLOVIR HCL 500 MG PO TABS: 500.0000 mg | ORAL_TABLET | Freq: Every day | ORAL | 12 refills | Status: AC

## 2024-06-26 NOTE — Progress Notes (Signed)
   ESTABLISHED GYNECOLOGY VISIT Chief Complaint  Patient presents with   Gynecologic Exam    PAP    Subjective:  Theresa Mooney is a 43 y.o. [id number] presenting for pap smear  Annual exam was 12/2023  She reports she would like to have a pap to check that all is ok. Has been going through a lot with urology for workup for hematuria.  Also notes history of herpes and requests an rx for prevention of outbreaks.  Pap history: 02/2022: ASCUS/HRHPV negative 2022: NILM/HRHPV neg 2021: NILM/HRHPV neg 2019: ASCUS/HPV neg   Review of Systems:   Pertinent items are noted in HPI  Pertinent History Reviewed:  Reviewed past medical,surgical, social and family history.  Reviewed problem list, medications and allergies.  Objective:   Vitals:   06/26/24 1100  BP: 129/87  Pulse: 77  Weight: 229 lb (103.9 kg)  Height: 4' 11 (1.499 m)   Physical Examination:   General appearance - well appearing, and in no distress  Mental status - alert, oriented to person, place, and time  Psych:  normal mood and affect  Skin - warm and dry, normal color, no suspicious lesions noted  Abdomen - soft, nontender, nondistended, no masses or organomegaly  Pelvic -  VULVA: normal appearing vulva with no masses, tenderness or lesions   VAGINA: normal appearing vagina with normal color, patient is on her period- +blood in vault cleared out for pap CERVIX: normal appearing cervix without discharge or lesions, no CMT   Thin prep pap is done with HR HPV cotesting     Chaperone present for exam  Assessment and Plan:  1. Cervical cancer screening (Primary) Reviewed that with ASCUS/HPV neg, would need repeat pap in 3 years which would be 02/2025. However, since it has been > 2 years and she presented here today with the request of pap, it is not unreasonable to do it today. Pap collected - Cytology - PAP( [place])  2. History of herpes genitalis Suppressive rx sent - valACYclovir (VALTREX) 500 MG  tablet; Take 1 tablet (500 mg total) by mouth daily. Can increase to twice a day for 5 days in the event of a recurrence  Dispense: 30 tablet; Refill: 12   No follow-ups on file.  Future Appointments  Date Time Provider Department Center  07/27/2024 10:30 AM Twylla, Glendia BROCKS, MD [facility] None  02/01/2025 10:20 AM Maurine Lukes, PA-C [facility] None    Rollo ONEIDA Bring, MD, FACOG Obstetrician & Gynecologist, [facility] for [facility], [facility]

## 2024-06-26 NOTE — Progress Notes (Signed)
 43 y.o. GYN presents for PAP.  Pt had her AEX on 01/02/24.

## 2024-06-28 ENCOUNTER — Ambulatory Visit: Payer: Self-pay | Admitting: Obstetrics and Gynecology

## 2024-06-28 LAB — CYTOLOGY - PAP
Comment: NEGATIVE
Diagnosis: UNDETERMINED — AB
High risk HPV: NEGATIVE

## 2024-07-05 ENCOUNTER — Ambulatory Visit
Admission: RE | Admit: 2024-07-05 | Discharge: 2024-07-05 | Disposition: A | Discharge status: Home / Self Care | Source: Ambulatory Visit | Attending: Urology | Admitting: Urology

## 2024-07-05 DIAGNOSIS — R319 Hematuria, unspecified: Secondary | ICD-10-CM | POA: Diagnosis present

## 2024-07-05 MED ORDER — IOHEXOL 300 MG/ML  SOLN
100.0000 mL | Freq: Once | INTRAMUSCULAR | Status: AC | PRN
  Administered 2024-07-05: 100 mL via INTRAVENOUS

## 2024-07-27 ENCOUNTER — Other Ambulatory Visit: Admitting: Urology

## 2024-07-30 ENCOUNTER — Encounter: Payer: Self-pay | Admitting: Radiology

## 2024-08-02 ENCOUNTER — Ambulatory Visit: Payer: Self-pay | Admitting: Nurse Practitioner

## 2024-08-02 ENCOUNTER — Ambulatory Visit
Admission: EM | Admit: 2024-08-02 | Discharge: 2024-08-02 | Disposition: A | Discharge status: Home / Self Care | Attending: Family Medicine | Admitting: Family Medicine

## 2024-08-02 ENCOUNTER — Ambulatory Visit (INDEPENDENT_AMBULATORY_CARE_PROVIDER_SITE_OTHER): Admitting: Radiology

## 2024-08-02 DIAGNOSIS — R300 Dysuria: Secondary | ICD-10-CM | POA: Diagnosis not present

## 2024-08-02 DIAGNOSIS — J4521 Mild intermittent asthma with (acute) exacerbation: Secondary | ICD-10-CM | POA: Insufficient documentation

## 2024-08-02 DIAGNOSIS — R051 Acute cough: Secondary | ICD-10-CM | POA: Insufficient documentation

## 2024-08-02 DIAGNOSIS — N3001 Acute cystitis with hematuria: Secondary | ICD-10-CM | POA: Insufficient documentation

## 2024-08-02 LAB — POCT URINE DIPSTICK
Bilirubin, UA: NEGATIVE
Glucose, UA: NEGATIVE mg/dL
Ketones, POC UA: NEGATIVE mg/dL
Nitrite, UA: POSITIVE — AB
POC PROTEIN,UA: NEGATIVE
Spec Grav, UA: 1.02 (ref 1.010–1.025)
Urobilinogen, UA: 0.2 U/dL
pH, UA: 5.5 (ref 5.0–8.0)

## 2024-08-02 LAB — POCT URINE PREGNANCY: Preg Test, Ur: NEGATIVE

## 2024-08-02 MED ORDER — PREDNISONE 20 MG PO TABS: 40.0000 mg | ORAL_TABLET | Freq: Every day | ORAL | 0 refills | Status: AC

## 2024-08-02 MED ORDER — DOXYCYCLINE HYCLATE 100 MG PO CAPS: 100.0000 mg | ORAL_CAPSULE | Freq: Two times a day (BID) | ORAL | 0 refills | Status: DC

## 2024-08-02 NOTE — ED Triage Notes (Addendum)
 Pt present with chest congestion, productive cough. States she has coughed up green mucus.  States she was given antibiotics in [place]. Pt states she has frequent urination and an odor in her urine.

## 2024-08-02 NOTE — ED Provider Notes (Addendum)
 [facility]    CSN: [id number] Arrival date & time: 08/02/24  1532      History   Chief Complaint Chief Complaint  Patient presents with   Cough   Urinary Frequency    HPI Theresa Mooney is a 43 y.o. female  presents for evaluation of URI symptoms for 3 weeks. Patient reports associated symptoms of productive cough with congestion, purulent sputum. Denies N/V/D, fevers, sore throat, ear pain, body aches. Patient does have a hx of asthma.  Has been using her albuterol  inhaler.  Patient is not an active smoker.   Reports  sick contacts via children.  Pt has taken Alka-Seltzer OTC for symptoms. Patient states she was seen in [place]  last week for the symptoms when she was out of town.  States she was prescribed Zithromycin and Tessalon  which she completed with minimal improvement.  In addition patient reports 2 days of urinary burning, urgency, frequency and malodorous urine.  Denies hematuria, fevers, vomiting or flank pain.  No vaginal discharge or STD concern.  Does have a history of UTIs.  Has not taken any OTC treatments for this.  Pt has no other concerns at this time.    Cough Urinary Frequency    Past Medical History:  Diagnosis Date   Asthma    Cannabis dependence in remission [facility])    Ectopic pregnancy    Elevated glucose    Herpes 2002   Infection    Migraine    Mixed hyperlipidemia    Nicotine dependence    Trichimoniasis 12-2010   Urinary calculi     Patient Active Problem List   Diagnosis Date Noted   Bacterial vaginitis 03/19/2021   Pain in both lower extremities 03/17/2021   Migraine without status migrainosus, not intractable 06/23/2020   Encounter for sterilization 12/13/2011   S/P ectopic pregnancy 12/13/2011    Past Surgical History:  Procedure Laterality Date   DILATION AND CURETTAGE OF UTERUS     LAPAROSCOPY  11/27/2011   Procedure: LAPAROSCOPY OPERATIVE;  Surgeon: Winton Felt, MD;  Location: [facility];  Service: Gynecology;   Laterality: Bilateral;   SALPINGECTOMY     Bilateral for twin ectopic preg.    TUBAL LIGATION  12-05-2010    OB History     Gravida  7   Para  5   Term  5   Preterm      AB  2   Living  5      SAB      IAB  1   Ectopic  1   Multiple      Live Births  5            Home Medications    Prior to Admission medications   Medication Sig Start Date End Date Taking? Authorizing Provider  albuterol  (VENTOLIN  HFA) 108 (90 Base) MCG/ACT inhaler Inhale 2 puffs into the lungs every 6 (six) hours as needed for wheezing or shortness of breath. 01/19/24  Yes Tanda Bleacher, MD  doxycycline  (VIBRAMYCIN ) 100 MG capsule Take 1 capsule (100 mg total) by mouth 2 (two) times daily for 7 days. 08/02/24 08/09/24 Yes Brandyce Dimario, Jodi R, NP  predniSONE  (DELTASONE ) 20 MG tablet Take 2 tablets (40 mg total) by mouth daily with breakfast for 3 days. 08/02/24 08/05/24 Yes Miliani Deike, Jodi R, NP  valACYclovir (VALTREX) 500 MG tablet Take 1 tablet (500 mg total) by mouth daily. Can increase to twice a day for 5 days in the event of a recurrence  06/26/24  Yes Abigail Rollo DASEN, MD  phenazopyridine  (PYRIDIUM ) 200 MG tablet Take 1 tablet (200 mg total) by mouth 3 (three) times daily. 06/08/24   Piontek, Rocky, MD  SUMAtriptan  (IMITREX ) 50 MG tablet Take 1 tablet (50 mg total) by mouth every 2 (two) hours as needed for migraine. May repeat in 2 hours if headache persists or recurs. Patient not taking: Reported on 07/02/2020 06/23/20 07/29/20  Mayers, Kirk RAMAN, PA-C    Family History Family History  Problem Relation Age of Onset   Hypertension Mother    Asthma Mother    Hypertension Sister    Stroke Sister    Hyperlipidemia Daughter    Breast cancer Paternal Aunt 45 - 50   Diabetes Maternal Grandmother    Heart disease Maternal Grandmother    Hypertension Maternal Grandmother    Diabetes Maternal Grandfather    Heart disease Maternal Grandfather    Hypertension Maternal Grandfather    Healthy Son    Healthy  Son    Healthy Son    Anesthesia problems Neg Hx     Social History Social History   Tobacco Use   Smoking status: Former    Types: Cigarettes    Passive exposure: Past   Smokeless tobacco: Never  Vaping Use   Vaping status: Never Used  Substance Use Topics   Alcohol use: Not Currently    Alcohol/week: 1.0 standard drink of alcohol    Types: 1 Glasses of wine per week    Comment: social   Drug use: Not Currently    Types: Marijuana     Allergies   Bactrim  [sulfamethoxazole -trimethoprim ], Hydrocodone , and Penicillins   Review of Systems Review of Systems  HENT:  Positive for congestion.   Respiratory:  Positive for cough.   Genitourinary:  Positive for frequency.     Physical Exam Triage Vital Signs ED Triage Vitals  Encounter Vitals Group     BP 08/02/24 1548 114/79     Girls Systolic BP Percentile --      Girls Diastolic BP Percentile --      Boys Systolic BP Percentile --      Boys Diastolic BP Percentile --      Pulse Rate 08/02/24 1546 74     Resp 08/02/24 1548 16     Temp 08/02/24 1549 98.9 F (37.2 C)     Temp src --      SpO2 08/02/24 1546 96 %     Weight --      Height --      Head Circumference --      Peak Flow --      Pain Score 08/02/24 1545 0     Pain Loc --      Pain Education --      Exclude from Growth Chart --    No data found.  Updated Vital Signs BP 114/79   Pulse 74   Temp 98.9 F (37.2 C)   Resp 16   SpO2 96%   Visual Acuity Right Eye Distance:   Left Eye Distance:   Bilateral Distance:    Right Eye Near:   Left Eye Near:    Bilateral Near:     Physical Exam Vitals and nursing note reviewed.  Constitutional:      General: She is not in acute distress.    Appearance: She is well-developed. She is not ill-appearing.  HENT:     Head: Normocephalic and atraumatic.     Right Ear: Tympanic membrane  and ear canal normal.     Left Ear: Tympanic membrane and ear canal normal.     Nose: Congestion present.      Mouth/Throat:     Mouth: Mucous membranes are moist.     Pharynx: Oropharynx is clear. Uvula midline. No posterior oropharyngeal erythema.     Tonsils: No tonsillar exudate or tonsillar abscesses.  Eyes:     Conjunctiva/sclera: Conjunctivae normal.     Pupils: Pupils are equal, round, and reactive to light.  Cardiovascular:     Rate and Rhythm: Normal rate and regular rhythm.     Heart sounds: Normal heart sounds.  Pulmonary:     Effort: Pulmonary effort is normal.     Breath sounds: Normal breath sounds. No wheezing or rhonchi.  Abdominal:     Tenderness: There is no right CVA tenderness or left CVA tenderness.  Musculoskeletal:     Cervical back: Normal range of motion and neck supple.  Lymphadenopathy:     Cervical: No cervical adenopathy.  Skin:    General: Skin is warm and dry.  Neurological:     General: No focal deficit present.     Mental Status: She is alert and oriented to person, place, and time.  Psychiatric:        Mood and Affect: Mood normal.        Behavior: Behavior normal.      UC Treatments / Results  Labs (all labs ordered are listed, but only abnormal results are displayed) Labs Reviewed  POCT URINE DIPSTICK - Abnormal; Notable for the following components:      Result Value   Clarity, UA cloudy (*)    Blood, UA large (*)    Nitrite, UA Positive (*)    Leukocytes, UA Small (1+) (*)    All other components within normal limits  URINE CULTURE  POCT URINE PREGNANCY    EKG   Radiology No results found.  Procedures Procedures (including critical care time)  Medications Ordered in UC Medications - No data to display  Initial Impression / Assessment and Plan / UC Course  I have reviewed the triage vital signs and the nursing notes.  Pertinent labs & imaging results that were available during my care of the patient were reviewed by me and considered in my medical decision making (see chart for details).     Reviewed exam and symptoms with  patient.  Urine is positive for UTI will send urine culture and start doxycycline  as this will cover both urinary tract infection as well as respiratory infection.  No x-ray available onsite at time of evaluation, patient in agreement to go to [facility] urgent care for chest x-ray and I will contact her if any additional treatment is indicated based on those results.  Will do 3 days of prednisone  and she is to continue her inhaler as needed.  She states she will continue Tessalon  as needed for her cough.  Encouraged lots of rest and fluids and PCP follow-up if symptoms do not improve.  ER precautions reviewed and patient verbalized understanding   Addendum 1715: cxr negative. As discussed with patient prior to discharge no change in plan of care. Final Clinical Impressions(s) / UC Diagnoses   Final diagnoses:  Dysuria  Acute cystitis with hematuria  Acute cough  Mild intermittent asthma with acute exacerbation     Discharge Instructions      Please go to our [facility] urgent care to have your x-ray done.  I will contact you  if any additional treatment is indicated once those results are available.  I will also send your urine for culture and contact you if this is positive.  Start doxycycline  twice daily for 7 days as this will cover both urinary tract infection as well as respiratory infection.  We will extend your prednisone  for 3 days.  Continue your albuterol  inhaler and the Tessalon  you were previously prescribed.  Lots of rest and fluids.  Please follow-up with your PCP if your symptoms are not improving.  Please go to the ER if you develop any worsening symptoms.  I hope you feel better soon!    ED Prescriptions     Medication Sig Dispense Auth. Provider   doxycycline  (VIBRAMYCIN ) 100 MG capsule Take 1 capsule (100 mg total) by mouth 2 (two) times daily for 7 days. 14 capsule Maiyah Goyne, Jodi R, NP   predniSONE  (DELTASONE ) 20 MG tablet Take 2 tablets (40 mg total) by mouth daily with  breakfast for 3 days. 6 tablet Zunairah Devers, Jodi R, NP      PDMP not reviewed this encounter.   Loreda Myla SAUNDERS, NP 08/02/24 1621    Loreda Myla SAUNDERS, NP 08/02/24 [phone]

## 2024-08-02 NOTE — Discharge Instructions (Addendum)
 Please go to our [place] urgent care to have your x-ray done.  I will contact you if any additional treatment is indicated once those results are available.  I will also send your urine for culture and contact you if this is positive.  Start doxycycline  twice daily for 7 days as this will cover both urinary tract infection as well as respiratory infection.  We will extend your prednisone  for 3 days.  Continue your albuterol  inhaler and the Tessalon  you were previously prescribed.  Lots of rest and fluids.  Please follow-up with your PCP if your symptoms are not improving.  Please go to the ER if you develop any worsening symptoms.  I hope you feel better soon!

## 2024-08-04 LAB — URINE CULTURE: Culture: 80000 — AB

## 2024-08-05 ENCOUNTER — Telehealth: Payer: Self-pay | Admitting: Emergency Medicine

## 2024-08-05 ENCOUNTER — Telehealth: Payer: Self-pay | Admitting: Nurse Practitioner

## 2024-08-05 DIAGNOSIS — N3001 Acute cystitis with hematuria: Secondary | ICD-10-CM

## 2024-08-05 MED ORDER — NITROFURANTOIN MONOHYD MACRO 100 MG PO CAPS: 100.0000 mg | ORAL_CAPSULE | Freq: Two times a day (BID) | ORAL | 0 refills | Status: AC

## 2024-08-05 MED ORDER — NITROFURANTOIN MONOHYD MACRO 100 MG PO CAPS: 100.0000 mg | ORAL_CAPSULE | Freq: Two times a day (BID) | ORAL | 0 refills | Status: DC

## 2024-08-05 NOTE — Telephone Encounter (Signed)
 Left voicemail for patient to call back regarding urine culture results.  Will start Macrobid  twice daily for 7 days for UTI per culture results, sent to pharmacy.  She can continue doxycycline  for her URI symptoms.  MyChart message sent as well.

## 2024-08-05 NOTE — Telephone Encounter (Signed)
 Patient returned call to clinic, verified DOB.  Requested Rx be rerouted to [facility] on [address], completed.  Patient verbalized understanding of instruction.  All questions answered.

## 2024-08-05 NOTE — Telephone Encounter (Signed)
 Pt called stating the pharmacy told her they didn't have prescription. RN called pharmacy and they informed RN they have prescription but need insurance on file for pt. Returned call to patient and let her know of the issue and that she can do out of pocket if needed. Pt understood.

## 2024-08-06 ENCOUNTER — Ambulatory Visit (INDEPENDENT_AMBULATORY_CARE_PROVIDER_SITE_OTHER): Admitting: Urology

## 2024-08-06 VITALS — BP 101/72 | HR 87 | Ht 59.0 in | Wt 225.0 lb

## 2024-08-06 DIAGNOSIS — R3129 Other microscopic hematuria: Secondary | ICD-10-CM

## 2024-08-06 DIAGNOSIS — R319 Hematuria, unspecified: Secondary | ICD-10-CM

## 2024-08-06 NOTE — Progress Notes (Unsigned)
   08/06/24  CC:  Chief Complaint  Patient presents with   Cysto    HPI:  Blood pressure 101/72, pulse 87, height 4' 11 (1.499 m), weight 225 lb (102.1 kg). NED. A&Ox3.   No respiratory distress   Abd soft, NT, ND Normal external genitalia with patent urethral meatus  Cystoscopy Procedure Note  Patient identification was confirmed, informed consent was obtained, and patient was prepped using Betadine solution.  Lidocaine  jelly was administered per urethral meatus.    Procedure: - Flexible cystoscope introduced, without any difficulty.   - Thorough search of the bladder revealed:    normal urethral meatus    normal urothelium    no stones    no ulcers     no tumors    no urethral polyps    no trabeculation  - Ureteral orifices were normal in position and appearance.  Post-Procedure: - Patient tolerated the procedure well  Assessment/ Plan:    No follow-ups on file.  Glendia JAYSON Barba, MD

## 2024-08-07 ENCOUNTER — Encounter: Payer: Self-pay | Admitting: Urology

## 2024-08-07 MED ORDER — NITROFURANTOIN MACROCRYSTAL 50 MG PO CAPS: 50.0000 mg | ORAL_CAPSULE | Freq: Every day | ORAL | 0 refills | Status: DC

## 2024-09-05 ENCOUNTER — Ambulatory Visit: Payer: Self-pay

## 2024-09-05 NOTE — Telephone Encounter (Signed)
 FYI Only or Action Required?: FYI only for provider: appointment scheduled on 09/06/2024.  Patient was last seen in primary care on 05/07/2024 by Tanda Bleacher, MD.  Called Nurse Triage reporting Vaginal Itching.  Symptoms began x week.  Interventions attempted: Nothing.  Symptoms are: gradually worsening.  Triage Disposition: See Physician Within 24 Hours  Patient/caregiver understands and will follow disposition?: Yes    Copied from CRM #[id number]. Topic: Appointments - Appointment Scheduling >> Sep 05, 2024  2:02 PM Emylou G wrote: Itching and irritated in vagina.. needs appt asap Reason for Disposition  MODERATE-SEVERE itching (i.e., interferes with school, work, or sleep)  Answer Assessment - Initial Assessment Questions 1. SYMPTOM: What's the main symptom you're concerned about? (e.g., pain, itching, dryness)     Itching inside 2. LOCATION: Where is the   located? (e.g., inside/outside, left/right)     inside 3. ONSET: When did the    start?     X week 4. PAIN: Is there any pain? If Yes, ask: How bad is it? (Scale: 1-10; mild, moderate, severe)     irritation 5. ITCHING: Is there any itching? If Yes, ask: How bad is it? (Scale: 1-10; mild, moderate, severe)     Mild to moderate 6. CAUSE: What do you think is causing the discharge? Have you had the same problem before? What happened then?     unsure 7. OTHER SYMPTOMS: Do you have any other symptoms? (e.g., fever, itching, vaginal bleeding, pain with urination, injury to genital area, vaginal foreign body)     no 8. PREGNANCY: Is there any chance you are pregnant? When was your last menstrual period?     no  Protocols used: Vaginal Symptoms-A-AH

## 2024-09-06 ENCOUNTER — Ambulatory Visit (INDEPENDENT_AMBULATORY_CARE_PROVIDER_SITE_OTHER): Payer: Self-pay | Admitting: Primary Care

## 2024-10-04 ENCOUNTER — Other Ambulatory Visit: Payer: Self-pay | Admitting: Family Medicine

## 2024-10-04 DIAGNOSIS — Z1231 Encounter for screening mammogram for malignant neoplasm of breast: Secondary | ICD-10-CM

## 2024-10-04 NOTE — Progress Notes (Unsigned)
 "    10/05/2024 9:56 PM   Theresa Mooney 14-Jul-1981 [id number]  Referring provider: Tanda Bleacher, MD [address] [place],  [place] [address]  Urological history: 1.  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 Randall's plaques  Chief Complaint  Patient presents with   Over Active Bladder   HPI: Theresa Mooney is a 44 y.o. woman who presents today for repeat UA and symptom recheck.     Previous records reviewed.   She is having 1 to 7 daytime voids,  she is having nocturia 1-2, and urgency is strong.  She has mild SUI.  She is having urinary leakage 1-2 times weekly.  She sometimes wear absorbent products.  She does not limit fluid intake.  She does enage in toilet mapping.  Patient denies any modifying or aggravating factors.  Patient denies any recent UTI's, gross hematuria, dysuria or suprapubic/flank pain.  Patient denies any fevers, chills, nausea or vomiting.    She has been on antibiotics recently and feels she has a yeast infection.  She also feels the 50 mg nitrofurantoin  is not strong enough.    UA yellow clear, specific gravity > 1.030, pH 6.0, 3+ heme, 0-5 WBC's, 11-30 RBC's, > 10 epithelial cells, granular casts present, mucus threads present and a few bacteria.    PVR 7 mL   PMH: Past Medical History:  Diagnosis Date   Asthma    Cannabis dependence in remission [facility])    Ectopic pregnancy    Elevated glucose    Herpes 2002   Infection    Migraine    Mixed hyperlipidemia    Nicotine dependence    Trichimoniasis 12-2010   Urinary calculi     Surgical History: Past Surgical History:  Procedure Laterality Date   DILATION AND CURETTAGE OF UTERUS     LAPAROSCOPY  11/27/2011   Procedure: LAPAROSCOPY OPERATIVE;  Surgeon: Winton Felt, MD;  Location: [facility];  Service: Gynecology;  Laterality: Bilateral;   SALPINGECTOMY     Bilateral for twin ectopic preg.    TUBAL LIGATION  12-05-2010     Home Medications:  Allergies as of 10/05/2024       Reactions   Bactrim  [sulfamethoxazole -trimethoprim ] Shortness Of Breath, Other (See Comments)   Hydrocodone  Nausea Only   Dizziness   Penicillins    Did it involve swelling of the face/tongue/throat, SOB, or low BP? Y Did it involve sudden or severe rash/hives, skin peeling, or any reaction on the inside of your mouth or nose? N Did you need to seek medical attention at a hospital or doctor's office? Y When did it last happen?  Over 1 month     If all above answers are NO, may proceed with cephalosporin use.        Medication List        Accurate as of October 05, 2024 11:59 PM. If you have any questions, ask your nurse or doctor.          STOP taking these medications    nitrofurantoin  50 MG capsule Commonly known as: MACRODANTIN        TAKE these medications    albuterol  108 (90 Base) MCG/ACT inhaler Commonly known as: VENTOLIN  HFA Inhale 2 puffs into the lungs every 6 (six) hours as needed for wheezing or shortness of breath.   fluconazole  150 MG tablet Commonly known as: DIFLUCAN  Take 1 tablet (150 mg total) by mouth once for  1 dose.   nitrofurantoin  (macrocrystal-monohydrate) 100 MG capsule Commonly known as: MACROBID  Take 100 mg by mouth 2 (two) times daily. What changed: Another medication with the same name was added. Make sure you understand how and when to take each.   nitrofurantoin  (macrocrystal-monohydrate) 100 MG capsule Commonly known as: MACROBID  Take 1 capsule (100 mg total) by mouth at bedtime. What changed: You were already taking a medication with the same name, and this prescription was added. Make sure you understand how and when to take each.   phenazopyridine  200 MG tablet Commonly known as: PYRIDIUM  Take 1 tablet (200 mg total) by mouth 3 (three) times daily.   valACYclovir  500 MG tablet Commonly known as: VALTREX  Take 1 tablet (500 mg total) by mouth daily. Can increase to  twice a day for 5 days in the event of a recurrence        Allergies:  Allergies  Allergen Reactions   Bactrim  [Sulfamethoxazole -Trimethoprim ] Shortness Of Breath and Other (See Comments)   Hydrocodone  Nausea Only    Dizziness   Penicillins     Did it involve swelling of the face/tongue/throat, SOB, or low BP? Y Did it involve sudden or severe rash/hives, skin peeling, or any reaction on the inside of your mouth or nose? N Did you need to seek medical attention at a hospital or doctor's office? Y When did it last happen?  Over 1 month     If all above answers are NO, may proceed with cephalosporin use.      Family History: Family History  Problem Relation Age of Onset   Hypertension Mother    Asthma Mother    Hypertension Sister    Stroke Sister    Hyperlipidemia Daughter    Breast cancer Paternal Aunt 71 - 76   Diabetes Maternal Grandmother    Heart disease Maternal Grandmother    Hypertension Maternal Grandmother    Diabetes Maternal Grandfather    Heart disease Maternal Grandfather    Hypertension Maternal Grandfather    Healthy Son    Healthy Son    Healthy Son    Anesthesia problems Neg Hx     Social History: See HPI for pertinent social history  ROS: Pertinent ROS in HPI  Physical Exam: BP 105/74   Pulse 68   Ht 4' 11 (1.499 m)   SpO2 98%   BMI 45.44 kg/m   Constitutional:  Well nourished. Alert and oriented, No acute distress. HEENT: [place] AT, moist mucus membranes.  Trachea midline Cardiovascular: No clubbing, cyanosis, or edema. Respiratory: Normal respiratory effort, no increased work of breathing. Neurologic: Grossly intact, no focal deficits, moving all 4 extremities. Psychiatric: Normal mood and affect.    Laboratory Data: See EPIC and HPI  I have reviewed the labs.   Pertinent Imaging:  10/05/24 09:16  Scan Result 7mL    Assessment & Plan:    1. 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 Yeast infection - Diflucan  150 mg sent to pharmacy    Return in about 3 months (around 01/03/2025) for  UA and symptom recheck .  These notes generated with voice recognition software. I apologize for typographical errors.  CLOTILDA HELON RIGGERS  [facility] [address]  [address] [place], [place] [address] [phone]   "

## 2024-10-05 ENCOUNTER — Ambulatory Visit: Admitting: Urology

## 2024-10-05 VITALS — BP 105/74 | HR 68 | Ht 59.0 in

## 2024-10-05 DIAGNOSIS — R319 Hematuria, unspecified: Secondary | ICD-10-CM | POA: Diagnosis not present

## 2024-10-05 DIAGNOSIS — N200 Calculus of kidney: Secondary | ICD-10-CM

## 2024-10-05 DIAGNOSIS — B3731 Acute candidiasis of vulva and vagina: Secondary | ICD-10-CM | POA: Diagnosis not present

## 2024-10-05 LAB — URINALYSIS, COMPLETE
Bilirubin, UA: NEGATIVE
Glucose, UA: NEGATIVE
Ketones, UA: NEGATIVE
Leukocytes,UA: NEGATIVE
Nitrite, UA: NEGATIVE
Protein,UA: NEGATIVE
Specific Gravity, UA: 1.03 (ref 1.005–1.030)
Urobilinogen, Ur: 0.2 mg/dL (ref 0.2–1.0)
pH, UA: 6 (ref 5.0–7.5)

## 2024-10-05 LAB — MICROSCOPIC EXAMINATION: Epithelial Cells (non renal): >10 /HPF — AB (ref 0–10)

## 2024-10-05 LAB — BLADDER SCAN AMB NON-IMAGING

## 2024-10-05 MED ORDER — NITROFURANTOIN MONOHYD MACRO 100 MG PO CAPS: 100.0000 mg | ORAL_CAPSULE | Freq: Every day | ORAL | 0 refills | Status: AC

## 2024-10-05 MED ORDER — FLUCONAZOLE 150 MG PO TABS: 150.0000 mg | ORAL_TABLET | Freq: Once | ORAL | 0 refills | Status: AC

## 2024-10-07 ENCOUNTER — Encounter: Payer: Self-pay | Admitting: Urology

## 2024-10-23 ENCOUNTER — Inpatient Hospital Stay: Admission: RE | Admit: 2024-10-23 | Source: Ambulatory Visit

## 2024-10-24 ENCOUNTER — Ambulatory Visit

## 2024-10-26 ENCOUNTER — Emergency Department (HOSPITAL_COMMUNITY)
Admission: EM | Admit: 2024-10-26 | Discharge: 2024-10-26 | Disposition: A | Discharge status: Home / Self Care | Attending: Emergency Medicine | Admitting: Emergency Medicine

## 2024-10-26 ENCOUNTER — Encounter (HOSPITAL_COMMUNITY): Payer: Self-pay

## 2024-10-26 ENCOUNTER — Other Ambulatory Visit: Payer: Self-pay

## 2024-10-26 DIAGNOSIS — R110 Nausea: Secondary | ICD-10-CM | POA: Diagnosis not present

## 2024-10-26 DIAGNOSIS — R002 Palpitations: Secondary | ICD-10-CM | POA: Diagnosis not present

## 2024-10-26 DIAGNOSIS — J45909 Unspecified asthma, uncomplicated: Secondary | ICD-10-CM | POA: Diagnosis not present

## 2024-10-26 DIAGNOSIS — F12929 Cannabis use, unspecified with intoxication, unspecified: Secondary | ICD-10-CM

## 2024-10-26 DIAGNOSIS — F12120 Cannabis abuse with intoxication, uncomplicated: Secondary | ICD-10-CM | POA: Insufficient documentation

## 2024-10-26 LAB — I-STAT CHEM 8, ED
BUN: 14 mg/dL (ref 6–20)
Calcium, Ion: 1.05 mmol/L — ABNORMAL LOW (ref 1.15–1.40)
Chloride: 102 mmol/L (ref 98–111)
Creatinine, Ser: 1.1 mg/dL — ABNORMAL HIGH (ref 0.44–1.00)
Glucose, Bld: 149 mg/dL — ABNORMAL HIGH (ref 70–99)
HCT: 34 % — ABNORMAL LOW (ref 36.0–46.0)
Hemoglobin: 11.6 g/dL — ABNORMAL LOW (ref 12.0–15.0)
Potassium: 3.9 mmol/L (ref 3.5–5.1)
Sodium: 138 mmol/L (ref 135–145)
TCO2: 23 mmol/L (ref 22–32)

## 2024-10-26 LAB — HCG, SERUM, QUALITATIVE: Preg, Serum: NEGATIVE

## 2024-10-26 MED ORDER — ONDANSETRON HCL 4 MG/2ML IJ SOLN
4.0000 mg | Freq: Once | INTRAMUSCULAR | Status: AC
  Administered 2024-10-26: 4 mg via INTRAVENOUS
  Filled 2024-10-26: qty 2

## 2024-10-26 NOTE — ED Triage Notes (Signed)
 Pt BIB [facility] from home c/o ingestion. Pt states she took a 10mg  Delta 8 gummy. Pt states she is dizzy and feels euphoric. Pt c/o n/v as well.  98/palp HR 124

## 2024-10-26 NOTE — ED Provider Notes (Signed)
 " [facility] AT [facility] Provider Note   CSN: [id number] Arrival date & time: 10/26/24  9746     Patient presents with: Ingestion   Theresa Mooney is a 44 y.o. female.   44 year old female presents to the emergency department for evaluation of nausea and palpitations.  Symptoms began after taking a 10 mg delta 8 gummy tonight.  She states that she feels very drowsy.  No chest pain, shortness of breath, coingestion.  The history is provided by the patient and the EMS personnel. No language interpreter was used.  Ingestion       Prior to Admission medications  Medication Sig Start Date End Date Taking? Authorizing Provider  albuterol  (VENTOLIN  HFA) 108 (90 Base) MCG/ACT inhaler Inhale 2 puffs into the lungs every 6 (six) hours as needed for wheezing or shortness of breath. 01/19/24   Tanda Bleacher, MD  nitrofurantoin , macrocrystal-monohydrate, (MACROBID ) 100 MG capsule Take 100 mg by mouth 2 (two) times daily. 09/28/24   [provider]  nitrofurantoin , macrocrystal-monohydrate, (MACROBID ) 100 MG capsule Take 1 capsule (100 mg total) by mouth at bedtime. 10/05/24   McGowan, Clotilda LABOR, PA-C  phenazopyridine  (PYRIDIUM ) 200 MG tablet Take 1 tablet (200 mg total) by mouth 3 (three) times daily. 06/08/24   Piontek, Rocky, MD  valACYclovir  (VALTREX ) 500 MG tablet Take 1 tablet (500 mg total) by mouth daily. Can increase to twice a day for 5 days in the event of a recurrence 06/26/24   Abigail Rollo DASEN, MD    Allergies: Bactrim  [sulfamethoxazole -trimethoprim ], Hydrocodone , and Penicillins    Review of Systems Ten systems reviewed and are negative for acute change, except as noted in the HPI.    Updated Vital Signs BP 105/75   Pulse 95   Temp 97.8 F (36.6 C) (Oral)   Resp 19   Ht 4' 11 (1.499 m)   Wt 104.3 kg   SpO2 100%   BMI 46.45 kg/m   Physical Exam Vitals and nursing note reviewed.  Constitutional:      General: She is not in acute  distress.    Appearance: She is well-developed. She is not diaphoretic.     Comments: Nontoxic appearing and in NAD  HENT:     Head: Normocephalic and atraumatic.  Eyes:     General: No scleral icterus.    Conjunctiva/sclera: Conjunctivae normal.  Cardiovascular:     Rate and Rhythm: Normal rate and regular rhythm.     Pulses: Normal pulses.     Comments: HR in 80's, regular. Pulmonary:     Effort: Pulmonary effort is normal. No respiratory distress.     Comments: Respirations even and unlabored Musculoskeletal:        General: Normal range of motion.     Cervical back: Normal range of motion.  Skin:    General: Skin is warm and dry.     Coloration: Skin is not pale.     Findings: No erythema or rash.  Neurological:     Mental Status: She is alert and oriented to person, place, and time.     Coordination: Coordination normal.  Psychiatric:        Behavior: Behavior normal.     (all labs ordered are listed, but only abnormal results are displayed) Labs Reviewed  I-STAT CHEM 8, ED - Abnormal; Notable for the following components:      Result Value   Creatinine, Ser 1.10 (*)    Glucose, Bld 149 (*)  Calcium, Ion 1.05 (*)    Hemoglobin 11.6 (*)    HCT 34.0 (*)    All other components within normal limits  HCG, SERUM, QUALITATIVE    EKG: EKG Interpretation Date/Time:  Friday October 26 2024 03:03:18 EST Ventricular Rate:  97 PR Interval:  180 QRS Duration:  72 QT Interval:  400 QTC Calculation: 509 R Axis:   12  Text Interpretation: Sinus rhythm Abnormal R-wave progression, early transition Borderline T abnormalities, anterior leads Borderline prolonged QT interval Confirmed by Midge Golas ([id number]) on 10/26/2024 3:29:51 AM  Radiology: No results found.   Procedures   Medications Ordered in the ED  ondansetron  (ZOFRAN ) injection 4 mg (4 mg Intravenous Given 10/26/24 0319)    Clinical Course as of 10/26/24 0619  Fri Oct 26, 2024  0616 Patient feeling  much better compared to arrival.  Vital stable.  She expresses comfort with discharge. [KH]    Clinical Course User Index [KH] Keith Sor, PA-C                                 Medical Decision Making Amount and/or Complexity of Data Reviewed Labs: ordered.  Risk Prescription drug management.   This patient presents to the ED for concern of nausea and palpitations, this involves an extensive number of treatment options, and is a complaint that carries with it a high risk of complications and morbidity.  The differential diagnosis includes substance use/intoxication vs viral illness vs electrolyte derangement vs arrhythmia   Co morbidities that complicate the patient evaluation  HLD Asthma   Additional history obtained:  Additional history obtained from EMS personnel External records from outside source obtained and reviewed including prior discharge summaries.   Lab Tests:  I Ordered, and personally interpreted labs.  The pertinent results include:  Creatinine 1.10. Hgb 11.6.    Cardiac Monitoring:  The patient was maintained on a cardiac monitor.  I personally viewed and interpreted the cardiac monitored which showed an underlying rhythm of: NSR   Medicines ordered and prescription drug management:  I ordered medication including Zofran  for nausea  Reevaluation of the patient after these medicines showed that the patient improved I have reviewed the patients home medicines and have made adjustments as needed   Test Considered:  UDS   Problem List / ED Course:  As above   Reevaluation:  After the interventions noted above, I reevaluated the patient and found that they have :improved   Social Determinants of Health:  Lives independently   Dispostion:  After consideration of the diagnostic results and the patients response to treatment, I feel that the patent would benefit from abstaining from use of illicit substances. Clinically sober at time of  discharge. Return precautions discussed and provided. Patient discharged in stable condition with no unaddressed concerns.       Final diagnoses:  Synthetic cannabinoid intoxication    ED Discharge Orders     None          Keith Sor, PA-C 10/26/24 9370    Midge Golas, MD 10/26/24 [phone]  "

## 2024-10-29 ENCOUNTER — Ambulatory Visit

## 2024-11-07 ENCOUNTER — Ambulatory Visit

## 2025-01-04 ENCOUNTER — Ambulatory Visit: Admitting: Urology

## 2025-02-01 ENCOUNTER — Ambulatory Visit: Admitting: Physician Assistant
